# Patient Record
Sex: Male | Born: 1955 | Race: White | Hispanic: No | Marital: Married | Smoking: Former smoker
Health system: Southern US, Community
[De-identification: ages and names within clinical notes are randomized; demographics above are authoritative.]

## PROBLEM LIST (undated history)

## (undated) DIAGNOSIS — R0609 Other forms of dyspnea: Secondary | ICD-10-CM

## (undated) DIAGNOSIS — K08109 Complete loss of teeth, unspecified cause, unspecified class: Secondary | ICD-10-CM

## (undated) DIAGNOSIS — G459 Transient cerebral ischemic attack, unspecified: Secondary | ICD-10-CM

## (undated) DIAGNOSIS — R413 Other amnesia: Secondary | ICD-10-CM

## (undated) DIAGNOSIS — M1A9XX0 Chronic gout, unspecified, without tophus (tophi): Secondary | ICD-10-CM

## (undated) DIAGNOSIS — I10 Essential (primary) hypertension: Secondary | ICD-10-CM

## (undated) DIAGNOSIS — I6523 Occlusion and stenosis of bilateral carotid arteries: Secondary | ICD-10-CM

## (undated) DIAGNOSIS — I480 Paroxysmal atrial fibrillation: Secondary | ICD-10-CM

## (undated) DIAGNOSIS — F1991 Other psychoactive substance use, unspecified, in remission: Secondary | ICD-10-CM

## (undated) DIAGNOSIS — E119 Type 2 diabetes mellitus without complications: Secondary | ICD-10-CM

## (undated) DIAGNOSIS — F32A Depression, unspecified: Secondary | ICD-10-CM

## (undated) DIAGNOSIS — Z8249 Family history of ischemic heart disease and other diseases of the circulatory system: Secondary | ICD-10-CM

## (undated) DIAGNOSIS — E785 Hyperlipidemia, unspecified: Secondary | ICD-10-CM

## (undated) DIAGNOSIS — Z972 Presence of dental prosthetic device (complete) (partial): Secondary | ICD-10-CM

## (undated) DIAGNOSIS — M1A00X0 Idiopathic chronic gout, unspecified site, without tophus (tophi): Secondary | ICD-10-CM

## (undated) DIAGNOSIS — J439 Emphysema, unspecified: Secondary | ICD-10-CM

## (undated) DIAGNOSIS — Z794 Long term (current) use of insulin: Secondary | ICD-10-CM

## (undated) DIAGNOSIS — I739 Peripheral vascular disease, unspecified: Secondary | ICD-10-CM

## (undated) DIAGNOSIS — I6529 Occlusion and stenosis of unspecified carotid artery: Secondary | ICD-10-CM

## (undated) DIAGNOSIS — I2510 Atherosclerotic heart disease of native coronary artery without angina pectoris: Secondary | ICD-10-CM

## (undated) DIAGNOSIS — I499 Cardiac arrhythmia, unspecified: Secondary | ICD-10-CM

## (undated) DIAGNOSIS — F0390 Unspecified dementia without behavioral disturbance: Secondary | ICD-10-CM

## (undated) DIAGNOSIS — F419 Anxiety disorder, unspecified: Secondary | ICD-10-CM

## (undated) DIAGNOSIS — I639 Cerebral infarction, unspecified: Secondary | ICD-10-CM

## (undated) DIAGNOSIS — Z955 Presence of coronary angioplasty implant and graft: Secondary | ICD-10-CM

## (undated) DIAGNOSIS — F1910 Other psychoactive substance abuse, uncomplicated: Secondary | ICD-10-CM

## (undated) DIAGNOSIS — N189 Chronic kidney disease, unspecified: Secondary | ICD-10-CM

## (undated) DIAGNOSIS — Z8673 Personal history of transient ischemic attack (TIA), and cerebral infarction without residual deficits: Secondary | ICD-10-CM

## (undated) DIAGNOSIS — Z7901 Long term (current) use of anticoagulants: Secondary | ICD-10-CM

## (undated) DIAGNOSIS — F329 Major depressive disorder, single episode, unspecified: Secondary | ICD-10-CM

## (undated) DIAGNOSIS — M1990 Unspecified osteoarthritis, unspecified site: Secondary | ICD-10-CM

## (undated) DIAGNOSIS — I219 Acute myocardial infarction, unspecified: Secondary | ICD-10-CM

## (undated) HISTORY — PX: UMBILICAL HERNIA REPAIR: SHX196

## (undated) HISTORY — DX: Other psychoactive substance abuse, uncomplicated: F19.10

## (undated) HISTORY — DX: Peripheral vascular disease, unspecified: I73.9

## (undated) HISTORY — DX: Hyperlipidemia, unspecified: E78.5

## (undated) HISTORY — PX: HERNIA REPAIR: SHX51

## (undated) HISTORY — PX: PERIPHERAL VASCULAR INTERVENTION: CATH118257

## (undated) HISTORY — DX: Atherosclerotic heart disease of native coronary artery without angina pectoris: I25.10

---

## 1998-11-28 ENCOUNTER — Encounter: Admission: RE | Admit: 1998-11-28 | Discharge: 1998-11-28 | Payer: Self-pay | Admitting: Family Medicine

## 1999-03-08 ENCOUNTER — Emergency Department (HOSPITAL_COMMUNITY): Admission: EM | Admit: 1999-03-08 | Discharge: 1999-03-08 | Payer: Self-pay | Admitting: Emergency Medicine

## 2000-09-15 ENCOUNTER — Encounter: Payer: Self-pay | Admitting: Emergency Medicine

## 2000-09-15 ENCOUNTER — Emergency Department (HOSPITAL_COMMUNITY): Admission: EM | Admit: 2000-09-15 | Discharge: 2000-09-15 | Payer: Self-pay | Admitting: Emergency Medicine

## 2000-12-17 ENCOUNTER — Emergency Department (HOSPITAL_COMMUNITY): Admission: EM | Admit: 2000-12-17 | Discharge: 2000-12-17 | Payer: Self-pay | Admitting: Emergency Medicine

## 2000-12-17 ENCOUNTER — Encounter: Payer: Self-pay | Admitting: *Deleted

## 2003-02-04 ENCOUNTER — Emergency Department (HOSPITAL_COMMUNITY): Admission: EM | Admit: 2003-02-04 | Discharge: 2003-02-04 | Payer: Self-pay

## 2003-02-20 ENCOUNTER — Emergency Department (HOSPITAL_COMMUNITY): Admission: EM | Admit: 2003-02-20 | Discharge: 2003-02-20 | Payer: Self-pay | Admitting: Emergency Medicine

## 2003-10-12 ENCOUNTER — Emergency Department (HOSPITAL_COMMUNITY): Admission: EM | Admit: 2003-10-12 | Discharge: 2003-10-12 | Payer: Self-pay | Admitting: Emergency Medicine

## 2003-10-16 ENCOUNTER — Emergency Department (HOSPITAL_COMMUNITY): Admission: EM | Admit: 2003-10-16 | Discharge: 2003-10-16 | Payer: Self-pay | Admitting: Emergency Medicine

## 2004-04-24 ENCOUNTER — Emergency Department (HOSPITAL_COMMUNITY): Admission: EM | Admit: 2004-04-24 | Discharge: 2004-04-24 | Payer: Self-pay | Admitting: Emergency Medicine

## 2004-06-10 ENCOUNTER — Emergency Department (HOSPITAL_COMMUNITY): Admission: EM | Admit: 2004-06-10 | Discharge: 2004-06-10 | Payer: Self-pay | Admitting: Emergency Medicine

## 2004-08-03 ENCOUNTER — Emergency Department (HOSPITAL_COMMUNITY): Admission: EM | Admit: 2004-08-03 | Discharge: 2004-08-03 | Payer: Self-pay | Admitting: Family Medicine

## 2004-08-18 ENCOUNTER — Encounter: Admission: RE | Admit: 2004-08-18 | Discharge: 2004-08-18 | Payer: Self-pay | Admitting: Family Medicine

## 2004-12-18 ENCOUNTER — Emergency Department (HOSPITAL_COMMUNITY): Admission: EM | Admit: 2004-12-18 | Discharge: 2004-12-18 | Payer: Self-pay | Admitting: Family Medicine

## 2005-03-29 DIAGNOSIS — I2510 Atherosclerotic heart disease of native coronary artery without angina pectoris: Secondary | ICD-10-CM

## 2005-03-29 HISTORY — DX: Atherosclerotic heart disease of native coronary artery without angina pectoris: I25.10

## 2005-07-27 ENCOUNTER — Emergency Department (HOSPITAL_COMMUNITY): Admission: EM | Admit: 2005-07-27 | Discharge: 2005-07-27 | Payer: Self-pay | Admitting: Emergency Medicine

## 2005-09-21 ENCOUNTER — Ambulatory Visit: Payer: Self-pay | Admitting: *Deleted

## 2005-09-21 ENCOUNTER — Ambulatory Visit: Payer: Self-pay | Admitting: Family Medicine

## 2005-10-26 ENCOUNTER — Ambulatory Visit: Payer: Self-pay | Admitting: Family Medicine

## 2006-01-17 ENCOUNTER — Ambulatory Visit: Payer: Self-pay | Admitting: Internal Medicine

## 2006-01-17 ENCOUNTER — Inpatient Hospital Stay (HOSPITAL_COMMUNITY): Admission: EM | Admit: 2006-01-17 | Discharge: 2006-01-19 | Payer: Self-pay | Admitting: Emergency Medicine

## 2006-01-17 HISTORY — PX: CARDIAC CATHETERIZATION: SHX172

## 2006-01-18 ENCOUNTER — Encounter: Payer: Self-pay | Admitting: Cardiology

## 2006-01-20 ENCOUNTER — Ambulatory Visit: Payer: Self-pay | Admitting: Family Medicine

## 2006-05-27 ENCOUNTER — Ambulatory Visit: Payer: Self-pay | Admitting: Family Medicine

## 2006-09-01 ENCOUNTER — Ambulatory Visit: Payer: Self-pay | Admitting: Internal Medicine

## 2006-12-13 ENCOUNTER — Ambulatory Visit: Payer: Self-pay | Admitting: Internal Medicine

## 2006-12-14 ENCOUNTER — Encounter (INDEPENDENT_AMBULATORY_CARE_PROVIDER_SITE_OTHER): Payer: Self-pay | Admitting: *Deleted

## 2006-12-15 ENCOUNTER — Ambulatory Visit: Payer: Self-pay | Admitting: Internal Medicine

## 2006-12-17 ENCOUNTER — Emergency Department (HOSPITAL_COMMUNITY): Admission: EM | Admit: 2006-12-17 | Discharge: 2006-12-17 | Payer: Self-pay | Admitting: Emergency Medicine

## 2006-12-19 ENCOUNTER — Emergency Department (HOSPITAL_COMMUNITY): Admission: EM | Admit: 2006-12-19 | Discharge: 2006-12-19 | Payer: Self-pay | Admitting: Emergency Medicine

## 2006-12-19 ENCOUNTER — Ambulatory Visit: Payer: Self-pay | Admitting: Internal Medicine

## 2006-12-20 ENCOUNTER — Ambulatory Visit: Payer: Self-pay | Admitting: Internal Medicine

## 2006-12-23 ENCOUNTER — Ambulatory Visit: Payer: Self-pay | Admitting: Internal Medicine

## 2006-12-26 ENCOUNTER — Ambulatory Visit: Payer: Self-pay | Admitting: Internal Medicine

## 2007-01-12 ENCOUNTER — Ambulatory Visit: Payer: Self-pay | Admitting: Internal Medicine

## 2007-01-13 ENCOUNTER — Ambulatory Visit: Payer: Self-pay | Admitting: Internal Medicine

## 2007-01-19 ENCOUNTER — Ambulatory Visit: Payer: Self-pay | Admitting: Internal Medicine

## 2007-01-23 ENCOUNTER — Ambulatory Visit: Payer: Self-pay | Admitting: Internal Medicine

## 2007-02-02 ENCOUNTER — Ambulatory Visit: Payer: Self-pay | Admitting: Family Medicine

## 2007-02-14 ENCOUNTER — Ambulatory Visit: Payer: Self-pay | Admitting: Internal Medicine

## 2007-02-22 ENCOUNTER — Ambulatory Visit: Payer: Self-pay | Admitting: Internal Medicine

## 2007-03-08 ENCOUNTER — Ambulatory Visit: Payer: Self-pay | Admitting: Internal Medicine

## 2007-03-27 ENCOUNTER — Ambulatory Visit: Payer: Self-pay | Admitting: Family Medicine

## 2007-04-30 ENCOUNTER — Emergency Department (HOSPITAL_COMMUNITY): Admission: EM | Admit: 2007-04-30 | Discharge: 2007-04-30 | Payer: Self-pay | Admitting: Emergency Medicine

## 2007-05-03 ENCOUNTER — Emergency Department (HOSPITAL_COMMUNITY): Admission: EM | Admit: 2007-05-03 | Discharge: 2007-05-03 | Payer: Self-pay | Admitting: Emergency Medicine

## 2007-07-17 ENCOUNTER — Ambulatory Visit: Payer: Self-pay | Admitting: Internal Medicine

## 2007-09-21 ENCOUNTER — Ambulatory Visit: Payer: Self-pay | Admitting: Internal Medicine

## 2007-10-11 ENCOUNTER — Ambulatory Visit: Payer: Self-pay | Admitting: Internal Medicine

## 2007-10-13 ENCOUNTER — Ambulatory Visit (HOSPITAL_COMMUNITY): Admission: RE | Admit: 2007-10-13 | Discharge: 2007-10-13 | Payer: Self-pay | Admitting: Family Medicine

## 2007-10-30 ENCOUNTER — Ambulatory Visit: Payer: Self-pay | Admitting: Internal Medicine

## 2008-01-25 ENCOUNTER — Ambulatory Visit: Payer: Self-pay | Admitting: Internal Medicine

## 2008-02-20 ENCOUNTER — Ambulatory Visit: Payer: Self-pay | Admitting: Family Medicine

## 2008-02-20 ENCOUNTER — Encounter (INDEPENDENT_AMBULATORY_CARE_PROVIDER_SITE_OTHER): Payer: Self-pay | Admitting: Adult Health

## 2008-02-20 LAB — CONVERTED CEMR LAB
ALT: 29 units/L (ref 0–53)
AST: 21 units/L (ref 0–37)
Albumin: 4.4 g/dL (ref 3.5–5.2)
Alkaline Phosphatase: 118 units/L — ABNORMAL HIGH (ref 39–117)
BUN: 13 mg/dL (ref 6–23)
Basophils Absolute: 0 10*3/uL (ref 0.0–0.1)
Basophils Relative: 0 % (ref 0–1)
CO2: 25 meq/L (ref 19–32)
Calcium: 8.9 mg/dL (ref 8.4–10.5)
Chloride: 105 meq/L (ref 96–112)
Creatinine, Ser: 1.23 mg/dL (ref 0.40–1.50)
Eosinophils Absolute: 0.1 10*3/uL (ref 0.0–0.7)
Eosinophils Relative: 2 % (ref 0–5)
Glucose, Bld: 106 mg/dL — ABNORMAL HIGH (ref 70–99)
HCT: 46.2 % (ref 39.0–52.0)
Hemoglobin: 15.5 g/dL (ref 13.0–17.0)
Lymphocytes Relative: 27 % (ref 12–46)
Lymphs Abs: 2.2 10*3/uL (ref 0.7–4.0)
MCHC: 33.5 g/dL (ref 30.0–36.0)
MCV: 96.7 fL (ref 78.0–100.0)
Monocytes Absolute: 0.8 10*3/uL (ref 0.1–1.0)
Monocytes Relative: 10 % (ref 3–12)
Neutro Abs: 4.9 10*3/uL (ref 1.7–7.7)
Neutrophils Relative %: 61 % (ref 43–77)
PSA: 1.04 ng/mL (ref 0.10–4.00)
Platelets: 202 10*3/uL (ref 150–400)
Potassium: 4 meq/L (ref 3.5–5.3)
RBC: 4.78 M/uL (ref 4.22–5.81)
RDW: 14.3 % (ref 11.5–15.5)
Sodium: 141 meq/L (ref 135–145)
Total Bilirubin: 0.4 mg/dL (ref 0.3–1.2)
Total Protein: 7 g/dL (ref 6.0–8.3)
Uric Acid, Serum: 7.2 mg/dL (ref 4.0–7.8)
WBC: 8 10*3/uL (ref 4.0–10.5)

## 2008-03-06 ENCOUNTER — Ambulatory Visit: Payer: Self-pay | Admitting: Family Medicine

## 2008-03-08 ENCOUNTER — Encounter (INDEPENDENT_AMBULATORY_CARE_PROVIDER_SITE_OTHER): Payer: Self-pay | Admitting: Adult Health

## 2008-03-08 ENCOUNTER — Ambulatory Visit: Payer: Self-pay | Admitting: Family Medicine

## 2008-03-08 LAB — CONVERTED CEMR LAB
Cholesterol: 89 mg/dL (ref 0–200)
HDL: 34 mg/dL — ABNORMAL LOW (ref >39)
LDL Cholesterol: 42 mg/dL (ref 0–99)
Total CHOL/HDL Ratio: 2.6
Triglycerides: 63 mg/dL (ref <150)
VLDL: 13 mg/dL (ref 0–40)

## 2008-05-01 ENCOUNTER — Inpatient Hospital Stay (HOSPITAL_COMMUNITY): Admission: EM | Admit: 2008-05-01 | Discharge: 2008-05-04 | Payer: Self-pay | Admitting: Emergency Medicine

## 2008-05-01 ENCOUNTER — Ambulatory Visit: Payer: Self-pay | Admitting: Cardiovascular Disease

## 2008-05-02 ENCOUNTER — Encounter (INDEPENDENT_AMBULATORY_CARE_PROVIDER_SITE_OTHER): Payer: Self-pay | Admitting: Family Medicine

## 2008-05-03 HISTORY — PX: CARDIAC CATHETERIZATION: SHX172

## 2008-05-28 ENCOUNTER — Ambulatory Visit: Payer: Self-pay | Admitting: Internal Medicine

## 2008-05-28 ENCOUNTER — Encounter (INDEPENDENT_AMBULATORY_CARE_PROVIDER_SITE_OTHER): Payer: Self-pay | Admitting: Adult Health

## 2008-05-28 LAB — CONVERTED CEMR LAB
ALT: 21 units/L (ref 0–53)
AST: 18 units/L (ref 0–37)
Albumin: 4 g/dL (ref 3.5–5.2)
Alkaline Phosphatase: 93 units/L (ref 39–117)
BUN: 15 mg/dL (ref 6–23)
Band Neutrophils: 0 % (ref 0–10)
Basophils Absolute: 0 10*3/uL (ref 0.0–0.1)
Basophils Relative: 1 % (ref 0–1)
CO2: 24 meq/L (ref 19–32)
Calcium: 9.1 mg/dL (ref 8.4–10.5)
Chloride: 108 meq/L (ref 96–112)
Creatinine, Ser: 1.11 mg/dL (ref 0.40–1.50)
Eosinophils Absolute: 0.2 10*3/uL (ref 0.0–0.7)
Eosinophils Relative: 2 % (ref 0–5)
Glucose, Bld: 85 mg/dL (ref 70–99)
HCT: 42.2 % (ref 39.0–52.0)
Hemoglobin: 14.5 g/dL (ref 13.0–17.0)
Lymphocytes Relative: 36 % (ref 12–46)
Lymphs Abs: 2.8 10*3/uL (ref 0.7–4.0)
MCHC: 34.4 g/dL (ref 30.0–36.0)
MCV: 97.2 fL (ref 78.0–100.0)
Monocytes Absolute: 1.1 10*3/uL — ABNORMAL HIGH (ref 0.1–1.0)
Monocytes Relative: 14 % — ABNORMAL HIGH (ref 3–12)
Neutro Abs: 3.6 10*3/uL (ref 1.7–7.7)
Neutrophils Relative %: 48 % (ref 43–77)
Platelets: 184 10*3/uL (ref 150–400)
Potassium: 4.7 meq/L (ref 3.5–5.3)
RBC: 4.34 M/uL (ref 4.22–5.81)
RDW: 14.5 % (ref 11.5–15.5)
Sodium: 142 meq/L (ref 135–145)
Total Bilirubin: 0.4 mg/dL (ref 0.3–1.2)
Total Protein: 6.9 g/dL (ref 6.0–8.3)
WBC: 7.6 10*3/uL (ref 4.0–10.5)

## 2008-10-21 ENCOUNTER — Emergency Department (HOSPITAL_COMMUNITY): Admission: EM | Admit: 2008-10-21 | Discharge: 2008-10-21 | Payer: Self-pay | Admitting: Emergency Medicine

## 2008-12-13 ENCOUNTER — Emergency Department (HOSPITAL_COMMUNITY): Admission: EM | Admit: 2008-12-13 | Discharge: 2008-12-13 | Payer: Self-pay | Admitting: Emergency Medicine

## 2009-02-26 ENCOUNTER — Emergency Department (HOSPITAL_COMMUNITY): Admission: EM | Admit: 2009-02-26 | Discharge: 2009-02-26 | Payer: Self-pay | Admitting: Emergency Medicine

## 2009-03-25 ENCOUNTER — Ambulatory Visit: Payer: Self-pay | Admitting: Cardiovascular Disease

## 2009-03-25 ENCOUNTER — Inpatient Hospital Stay (HOSPITAL_COMMUNITY): Admission: EM | Admit: 2009-03-25 | Discharge: 2009-03-26 | Payer: Self-pay | Admitting: Emergency Medicine

## 2009-03-26 ENCOUNTER — Encounter (INDEPENDENT_AMBULATORY_CARE_PROVIDER_SITE_OTHER): Payer: Self-pay | Admitting: Internal Medicine

## 2010-04-28 NOTE — Miscellaneous (Signed)
Summary: VIP  Patient: [name] Note: All result statuses are Final unless otherwise noted.  Tests: (1) VIP (Medications)   LLIMPORTMEDS              "Result Below..."       RESULT: ZOLOFT TABS 100 MG*TAKE ONE (1) TABLET EACH DAY   GENE HAS ICP ZOLOFT 100MG  0608*11/04/2006*Last Refill: 12/15/2006*[id number]*******   LLIMPORTMEDS              "Result Below..."       RESULT: XYZAL TABS 5 MG*TAKE 1 TABLET BY MOUTH EVERY DAY AS NEEDED FOR CONGESTION*05/27/2006*Last Refill: Ava.New*******   LLIMPORTMEDS              "Result Below..."       RESULT: VISTARIL CAPS 25 MG*TAKE 1 CAPSULE EVERY 6 HOURS AS NEEDED FOR BREAKTHROUGH ANXIETY*11/04/2006*Last Refill: Tara.Kingdom*******   LLIMPORTMEDS              "Result Below..."       RESULT: TRAZODONE HCL TABS 50 MG*TAKE ONE (1) TABLET AT BEDTIME*12/15/2006*Last Refill: Amey.Fanny*******   LLIMPORTMEDS              "Result Below..."       RESULT: TRAMADOL HCL TABS 50 MG*TAKE ONE TABLET EVERY 6 HOURS AS NEEDED FOR  PAIN*05/27/2006*Last Refill: 09/01/2006*[id number]*******   LLIMPORTMEDS              "Result Below..."       RESULT: TOPROL XL TB24 100 MG*TAKE ONE (1) TABLET EACH DAY*12/15/2006*Last Refill: Hart.Albright*******   LLIMPORTMEDS              "Result Below..."       RESULT: TIAZAC CP24 240 MG*TAKE ONE (1) CAPSULE EACH DAY*11/04/2006*Last Refill: Elis.Russel*******   LLIMPORTMEDS              "Result Below..."       RESULT: THIAMINE HCL TABS 100 MG*TAKE ONE (1) TABLET EACH DAY*11/04/2006*Last Refill: Unknown*[id number]*******   LLIMPORTMEDS              "Result Below..."       RESULT: COLCHICINE TABS 0.6 MG*TAKE ONE (1) TABLET BY MOUTH TWO (2) TIMES DAILY FOR GOUT*04/18/2006*Last Refill: [email or web address]*[id number]*******   LLIMPORTMEDS              "Result Below..."       RESULT: COLCHICINE TABS 0.6 MG*TAKE ONE (1) TABLET BY MOUTH TWO (2) TIMES DAILY*11/04/2006*Last Refill: [email or web address]*[id number]*******   LLIMPORTMEDS              "Result Below..."       RESULT:  CLONIDINE HCL TABS 0.2 MG*TAKE ONE (1) TABLET BY MOUTH 3 TIMES DAILY*12/23/2006*Last Refill: [email or web address]*[id number]*******   LLIMPORTMEDS              "Result Below..."       RESULT: BUSPIRONE HCL TABS 15 MG*TAKE 2/3 (TWO-THIRDS) TABLET BY MOUTH TWICE DAILY FOR ANXIETY*11/04/2006*Last Refill: [email or web address]*[id number]*******   LLIMPORTMEDS              "Result Below..."       RESULT: ASPIRIN EC TBEC 325 MG*TAKE ONE (1) TABLET EACH DAY*11/04/2006*Last Refill: Skyelar.Clunes*******   LLIMPORTMEDS              "Result Below..."       RESULT: AMITRIPTYLINE HCL TABS 25 MG*TAKE ONE TABLET BY MOUTH EVERY NIGHT AT BEDTIME*11/04/2006*Last Refill: Unknown*1130*******   LLIMPORTMEDS              "Result Below..."  RESULT: ALLOPURINOL TABS 100 MG*TAKE ONE (1) TABLET BY MOUTH EVERY DAY*11/04/2006*Last Refill: 12/09/2006*864*******   LLIMPORTALLS              NKDA***  Note: An exclamation mark (!) indicates a result that was not dispersed into the flowsheet. Document Creation Date: 01/26/2007 2:57 PM _______________________________________________________________________  (1) Order result status: Final Collection or observation date-time: 12/14/2006 Requested date-time: 12/14/2006 Receipt date-time:  Reported date-time: 12/14/2006 Referring Physician:   Ordering Physician:   Specimen Source:  Source: [place] Order Number:  Lab site:

## 2010-06-29 LAB — DIFFERENTIAL
Basophils Absolute: 0 10*3/uL (ref 0.0–0.1)
Basophils Relative: 1 % (ref 0–1)
Eosinophils Absolute: 0.1 10*3/uL (ref 0.0–0.7)
Eosinophils Relative: 1 % (ref 0–5)
Lymphocytes Relative: 27 % (ref 12–46)
Lymphs Abs: 2 10*3/uL (ref 0.7–4.0)
Monocytes Absolute: 0.8 10*3/uL (ref 0.1–1.0)
Monocytes Relative: 11 % (ref 3–12)
Neutro Abs: 4.6 10*3/uL (ref 1.7–7.7)
Neutrophils Relative %: 61 % (ref 43–77)

## 2010-06-29 LAB — CARDIAC PANEL(CRET KIN+CKTOT+MB+TROPI)
CK, MB: 1.1 ng/mL (ref 0.3–4.0)
Relative Index: INVALID (ref 0.0–2.5)
Total CK: 75 U/L (ref 7–232)
Troponin I: 0.03 ng/mL (ref 0.00–0.06)

## 2010-06-29 LAB — POCT CARDIAC MARKERS
CKMB, poc: <1 ng/mL — ABNORMAL LOW (ref 1.0–8.0)
Myoglobin, poc: 73.6 ng/mL (ref 12–200)
Troponin i, poc: <0.05 ng/mL (ref 0.00–0.09)

## 2010-06-29 LAB — RAPID URINE DRUG SCREEN, HOSP PERFORMED
Amphetamines: NOT DETECTED
Barbiturates: NOT DETECTED
Benzodiazepines: NOT DETECTED
Cocaine: NOT DETECTED
Opiates: NOT DETECTED
Tetrahydrocannabinol: POSITIVE — AB

## 2010-06-29 LAB — CBC
HCT: 43 % (ref 39.0–52.0)
Hemoglobin: 15.2 g/dL (ref 13.0–17.0)
MCHC: 35.4 g/dL (ref 30.0–36.0)
MCV: 97 fL (ref 78.0–100.0)
Platelets: 172 10*3/uL (ref 150–400)
RBC: 4.43 MIL/uL (ref 4.22–5.81)
RDW: 13.1 % (ref 11.5–15.5)
WBC: 7.6 10*3/uL (ref 4.0–10.5)

## 2010-06-29 LAB — POCT I-STAT, CHEM 8
BUN: 16 mg/dL (ref 6–23)
Calcium, Ion: 1.1 mmol/L — ABNORMAL LOW (ref 1.12–1.32)
Chloride: 106 mEq/L (ref 96–112)
Creatinine, Ser: 1.1 mg/dL (ref 0.4–1.5)
Glucose, Bld: 90 mg/dL (ref 70–99)
HCT: 45 % (ref 39.0–52.0)
Hemoglobin: 15.3 g/dL (ref 13.0–17.0)
Potassium: 3.5 mEq/L (ref 3.5–5.1)
Sodium: 141 mEq/L (ref 135–145)
TCO2: 30 mmol/L (ref 0–100)

## 2010-06-29 LAB — TSH: TSH: 1.658 u[IU]/mL (ref 0.350–4.500)

## 2010-06-30 LAB — CBC
HCT: 44.3 % (ref 39.0–52.0)
Hemoglobin: 15.2 g/dL (ref 13.0–17.0)
MCHC: 34.3 g/dL (ref 30.0–36.0)
MCV: 97.9 fL (ref 78.0–100.0)
Platelets: 175 10*3/uL (ref 150–400)
RBC: 4.52 MIL/uL (ref 4.22–5.81)
RDW: 13.6 % (ref 11.5–15.5)
WBC: 10.1 10*3/uL (ref 4.0–10.5)

## 2010-06-30 LAB — BASIC METABOLIC PANEL
BUN: 12 mg/dL (ref 6–23)
CO2: 26 mEq/L (ref 19–32)
Calcium: 8.8 mg/dL (ref 8.4–10.5)
Chloride: 105 mEq/L (ref 96–112)
Creatinine, Ser: 1.09 mg/dL (ref 0.4–1.5)
GFR calc Af Amer: >60 mL/min (ref >60)
GFR calc non Af Amer: >60 mL/min (ref >60)
Glucose, Bld: 96 mg/dL (ref 70–99)
Potassium: 3.8 mEq/L (ref 3.5–5.1)
Sodium: 139 mEq/L (ref 135–145)

## 2010-06-30 LAB — DIFFERENTIAL
Basophils Absolute: 0 10*3/uL (ref 0.0–0.1)
Basophils Relative: 0 % (ref 0–1)
Eosinophils Absolute: 0.1 10*3/uL (ref 0.0–0.7)
Eosinophils Relative: 1 % (ref 0–5)
Lymphocytes Relative: 21 % (ref 12–46)
Lymphs Abs: 2.1 10*3/uL (ref 0.7–4.0)
Monocytes Absolute: 1.2 10*3/uL — ABNORMAL HIGH (ref 0.1–1.0)
Monocytes Relative: 11 % (ref 3–12)
Neutro Abs: 6.7 10*3/uL (ref 1.7–7.7)
Neutrophils Relative %: 66 % (ref 43–77)

## 2010-07-03 LAB — POCT I-STAT, CHEM 8
BUN: 20 mg/dL (ref 6–23)
Calcium, Ion: 1.14 mmol/L (ref 1.12–1.32)
Chloride: 107 mEq/L (ref 96–112)
Creatinine, Ser: 1.2 mg/dL (ref 0.4–1.5)
Glucose, Bld: 92 mg/dL (ref 70–99)
HCT: 49 % (ref 39.0–52.0)
Hemoglobin: 16.7 g/dL (ref 13.0–17.0)
Potassium: 4.3 mEq/L (ref 3.5–5.1)
Sodium: 141 mEq/L (ref 135–145)
TCO2: 26 mmol/L (ref 0–100)

## 2010-07-03 LAB — POCT CARDIAC MARKERS
CKMB, poc: <1 ng/mL — ABNORMAL LOW (ref 1.0–8.0)
Myoglobin, poc: 87.9 ng/mL (ref 12–200)
Troponin i, poc: <0.05 ng/mL (ref 0.00–0.09)

## 2010-07-14 LAB — COMPREHENSIVE METABOLIC PANEL
ALT: 18 U/L (ref 0–53)
AST: 20 U/L (ref 0–37)
Albumin: 3.6 g/dL (ref 3.5–5.2)
Alkaline Phosphatase: 82 U/L (ref 39–117)
BUN: 8 mg/dL (ref 6–23)
CO2: 29 mEq/L (ref 19–32)
Calcium: 8.7 mg/dL (ref 8.4–10.5)
Chloride: 102 mEq/L (ref 96–112)
Creatinine, Ser: 0.99 mg/dL (ref 0.4–1.5)
GFR calc Af Amer: >60 mL/min (ref >60)
GFR calc non Af Amer: >60 mL/min (ref >60)
Glucose, Bld: 103 mg/dL — ABNORMAL HIGH (ref 70–99)
Potassium: 4.2 mEq/L (ref 3.5–5.1)
Sodium: 139 mEq/L (ref 135–145)
Total Bilirubin: 0.7 mg/dL (ref 0.3–1.2)
Total Protein: 6.6 g/dL (ref 6.0–8.3)

## 2010-07-14 LAB — BASIC METABOLIC PANEL
BUN: 10 mg/dL (ref 6–23)
BUN: 9 mg/dL (ref 6–23)
CO2: 27 mEq/L (ref 19–32)
CO2: 28 mEq/L (ref 19–32)
Calcium: 8.5 mg/dL (ref 8.4–10.5)
Calcium: 8.6 mg/dL (ref 8.4–10.5)
Chloride: 100 mEq/L (ref 96–112)
Chloride: 106 mEq/L (ref 96–112)
Creatinine, Ser: 1.13 mg/dL (ref 0.4–1.5)
Creatinine, Ser: 1.13 mg/dL (ref 0.4–1.5)
GFR calc Af Amer: >60 mL/min (ref >60)
GFR calc Af Amer: >60 mL/min (ref >60)
GFR calc non Af Amer: >60 mL/min (ref >60)
GFR calc non Af Amer: >60 mL/min (ref >60)
Glucose, Bld: 88 mg/dL (ref 70–99)
Glucose, Bld: 91 mg/dL (ref 70–99)
Potassium: 3.5 mEq/L (ref 3.5–5.1)
Potassium: 3.8 mEq/L (ref 3.5–5.1)
Sodium: 137 mEq/L (ref 135–145)
Sodium: 140 mEq/L (ref 135–145)

## 2010-07-14 LAB — CARDIAC PANEL(CRET KIN+CKTOT+MB+TROPI)
CK, MB: 1.3 ng/mL (ref 0.3–4.0)
CK, MB: 1.4 ng/mL (ref 0.3–4.0)
Relative Index: 1.1 (ref 0.0–2.5)
Relative Index: INVALID (ref 0.0–2.5)
Total CK: 116 U/L (ref 7–232)
Total CK: 82 U/L (ref 7–232)
Troponin I: 0.01 ng/mL (ref 0.00–0.06)
Troponin I: <0.01 ng/mL (ref 0.00–0.06)

## 2010-07-14 LAB — CBC
HCT: 38.7 % — ABNORMAL LOW (ref 39.0–52.0)
HCT: 40.6 % (ref 39.0–52.0)
HCT: 42.1 % (ref 39.0–52.0)
HCT: 43.1 % (ref 39.0–52.0)
HCT: 43.6 % (ref 39.0–52.0)
Hemoglobin: 13.5 g/dL (ref 13.0–17.0)
Hemoglobin: 14.3 g/dL (ref 13.0–17.0)
Hemoglobin: 14.4 g/dL (ref 13.0–17.0)
Hemoglobin: 14.7 g/dL (ref 13.0–17.0)
Hemoglobin: 15.3 g/dL (ref 13.0–17.0)
MCHC: 33.2 g/dL (ref 30.0–36.0)
MCHC: 34.8 g/dL (ref 30.0–36.0)
MCHC: 34.9 g/dL (ref 30.0–36.0)
MCHC: 35 g/dL (ref 30.0–36.0)
MCHC: 35.4 g/dL (ref 30.0–36.0)
MCV: 94.9 fL (ref 78.0–100.0)
MCV: 95.4 fL (ref 78.0–100.0)
MCV: 95.9 fL (ref 78.0–100.0)
MCV: 96 fL (ref 78.0–100.0)
MCV: 96.4 fL (ref 78.0–100.0)
Platelets: 147 10*3/uL — ABNORMAL LOW (ref 150–400)
Platelets: 155 10*3/uL (ref 150–400)
Platelets: 158 10*3/uL (ref 150–400)
Platelets: 167 10*3/uL (ref 150–400)
Platelets: 175 10*3/uL (ref 150–400)
RBC: 4.03 MIL/uL — ABNORMAL LOW (ref 4.22–5.81)
RBC: 4.28 MIL/uL (ref 4.22–5.81)
RBC: 4.38 MIL/uL (ref 4.22–5.81)
RBC: 4.47 MIL/uL (ref 4.22–5.81)
RBC: 4.57 MIL/uL (ref 4.22–5.81)
RDW: 14.1 % (ref 11.5–15.5)
RDW: 14.3 % (ref 11.5–15.5)
RDW: 14.3 % (ref 11.5–15.5)
RDW: 14.4 % (ref 11.5–15.5)
RDW: 14.5 % (ref 11.5–15.5)
WBC: 10.9 10*3/uL — ABNORMAL HIGH (ref 4.0–10.5)
WBC: 16.5 10*3/uL — ABNORMAL HIGH (ref 4.0–10.5)
WBC: 7.7 10*3/uL (ref 4.0–10.5)
WBC: 9.1 10*3/uL (ref 4.0–10.5)
WBC: 9.8 10*3/uL (ref 4.0–10.5)

## 2010-07-14 LAB — LIPID PANEL
Cholesterol: 90 mg/dL (ref 0–200)
HDL: 25 mg/dL — ABNORMAL LOW (ref >39)
LDL Cholesterol: 59 mg/dL (ref 0–99)
Total CHOL/HDL Ratio: 3.6 RATIO
Triglycerides: 30 mg/dL (ref <150)
VLDL: 6 mg/dL (ref 0–40)

## 2010-07-14 LAB — URINE MICROSCOPIC-ADD ON

## 2010-07-14 LAB — POCT CARDIAC MARKERS
CKMB, poc: <1 ng/mL — ABNORMAL LOW (ref 1.0–8.0)
CKMB, poc: <1 ng/mL — ABNORMAL LOW (ref 1.0–8.0)
Myoglobin, poc: 51.6 ng/mL (ref 12–200)
Myoglobin, poc: 84.6 ng/mL (ref 12–200)
Troponin i, poc: <0.05 ng/mL (ref 0.00–0.09)
Troponin i, poc: <0.05 ng/mL (ref 0.00–0.09)

## 2010-07-14 LAB — LIPASE, BLOOD: Lipase: 27 U/L (ref 11–59)

## 2010-07-14 LAB — HEMOGLOBIN A1C
Hgb A1c MFr Bld: 5.8 % (ref 4.6–6.1)
Mean Plasma Glucose: 120 mg/dL

## 2010-07-14 LAB — URINALYSIS, ROUTINE W REFLEX MICROSCOPIC
Bilirubin Urine: NEGATIVE
Glucose, UA: NEGATIVE mg/dL
Ketones, ur: NEGATIVE mg/dL
Leukocytes, UA: NEGATIVE
Nitrite: NEGATIVE
Protein, ur: NEGATIVE mg/dL
Specific Gravity, Urine: 1.012 (ref 1.005–1.030)
Urobilinogen, UA: 0.2 mg/dL (ref 0.0–1.0)
pH: 6 (ref 5.0–8.0)

## 2010-07-14 LAB — ETHANOL: Alcohol, Ethyl (B): 9 mg/dL (ref 0–10)

## 2010-07-14 LAB — GLUCOSE, CAPILLARY: Glucose-Capillary: 93 mg/dL (ref 70–99)

## 2010-07-14 LAB — D-DIMER, QUANTITATIVE: D-Dimer, Quant: 0.33 ug/mL-FEU (ref 0.00–0.48)

## 2010-07-14 LAB — RAPID URINE DRUG SCREEN, HOSP PERFORMED
Amphetamines: NOT DETECTED
Barbiturates: NOT DETECTED
Benzodiazepines: NOT DETECTED
Cocaine: NOT DETECTED
Opiates: POSITIVE — AB
Tetrahydrocannabinol: POSITIVE — AB

## 2010-07-14 LAB — TSH: TSH: 3.995 u[IU]/mL (ref 0.350–4.500)

## 2010-08-11 NOTE — H&P (Signed)
[name], [name]                 ACCOUNT NO.:  [id number]   MEDICAL RECORD NO.:  [id number]          PATIENT TYPE:  INP   LOCATION:  2917                         FACILITY:  [facility]   PHYSICIAN:  Wayne A. Sheffield Slider, M.D.    DATE OF BIRTH:  December 20, 1955   DATE OF ADMISSION:  05/01/2008  DATE OF DISCHARGE:                              HISTORY & PHYSICAL   REASON FOR ADMISSION:  Chest pain.   HISTORY OF PRESENT ILLNESS:  This is a 55 year old gentleman who comes  in complaining of heavy tight chest pain substernally with radiation to  the neck and jaw and left arm since approximately 8:30 a.m. on the day  of admission.  Nothing has been done that provokes the pain and the pain  does have some associated nausea.  He denies any shortness of breath.  He does complain of occasional diaphoresis with this pain.  Nothing does  seem to make it worse that he knows of, but he has mostly done nothing  since it started.  Nothing including aspirin, nitroglycerin x2, or rest  has relieved his pain.  Morphine and the third nitroglycerin, however,  did help the pain slightly in the emergency room.  Again, the pain is  essentially constant.  The patient's only other complaint at this time  is some polyuria for several weeks.   PRIMARY CARE PHYSICIAN:  [facility].   PREVIOUS CARDIOLOGIST:  [facility].   PAST MEDICAL HISTORY:  1. Hypertension.  2. History of AFib.  3. History of nonobstructive coronary artery disease.  4. History of cocaine abuse, reportedly claims since 2007.  5. Depression and anxiety.  6. Tobacco abuse with quit date set of January 2010.  7. Gout.  8. History of acute renal insufficiency.  9. Personal history of mini strokes.   PAST SURGICAL HISTORY:  Umbilical hernia repair.   FAMILY HISTORY:  Mother died from an MI in her 4s, she was also  complicated with hepatitis.  Father died of an MI in his 2s and other  heart problems that the patient is not aware of.  The patient  has 3  brothers, all are deceased from accident.  The patient has two sons, one  of which has Buerger disease and is in now remission and one of which is  healthy.  He also has one daughter which is also healthy.   SOCIAL HISTORY:  The patient is currently filing for disability.  He has  a seventh grade education.  He currently lives with son and daughter-in-  Social worker and grandchild and his home situation is quite stressful.  He is  separated from his wife for approximately 3 years to whom he has been  married for greater than 30 years.  She is also a cocaine abuser.  The  patient has approximately 10 years' smoking history where he smoked  about a pack a day and reportedly he quit on March 29, 2008.  He has  also a history of alcohol and cocaine abuse, but reportedly has been  clean from these substances for 2 years.  He does report  occasional  marijuana use at this time.   CURRENT MEDICATIONS:  Although the patient has been out of these for  approximately 3 weeks include:  1. Allopurinol 100 mg p.o. daily.  2. Tiazac 360 mg p.o. daily  3. Thiamine 100 mg p.o. daily.  4. Zoloft 100 mg p.o. daily.  5. Toprol-XL 25 mg p.o. daily.  6. Trazodone 50 mg p.o. nightly.  7. BuSpar 2 mg b.i.d.   ALLERGIES:  No known drug allergies.   REVIEW OF SYMPTOMS:  Positive for polyuria and chest pain as noted in  the HPI, but otherwise is negative for edema and dysuria, abdominal  pain, bowel symptoms and negative on greater than 10 other systems.   PHYSICAL EXAMINATION:  VITAL SIGNS:  Temperature 97.9, respiratory rate  14, pulse 68, blood pressure 147-177/72-97.  He is 98% on 2 liters nasal  cannula.  GENERAL:  This is a pleasant overweight white male in no apparent  distress, resting comfortably.  HEENT:  Normocephalic and atraumatic with pupils equal, round, and  reactive to light.  Extraocular muscles are intact.  Nares are clear  without discharge.  Oropharynx is pink and moist.  There is  poor  dentition.  NECK:  Supple.  CARDIOVASCULAR:  Regular rate and rhythm.  No murmurs, rubs, or gallops.  There are 2+ pulses in all extremities.  No JVD is appreciated.  PULMONARY:  Crackles at the bases bilaterally, but normal work of  breathing.  ABDOMEN:  Soft, nontender, and nondistended with positive bowel sounds.  No hepatosplenomegaly appreciated.  EXTREMITIES:  No edema and 2+ distal pulses.  SKIN:  No rashes.  NEURO:  The patient is alert and oriented x3 in no apparent deficits  grossly.   LABORATORY DATA:  White blood cell count 7.7, hemoglobin 14.3,  hematocrit 43.1, and platelet count 175 with point of care negative x2  sets.  EKG, I am unable to appreciate P-wave and there is some J-point  elevation in V2 and V3, but this is unchanged from previous EKG.  Chest  x-ray reveals no acute disease.   ASSESSMENT:  This is a 55 year old gentleman with chest pain, history of  nonobstructive coronary artery disease, and history of hypertension, now  with hypertensive urgency.   PLAN:  1. Chest pain.  Given a personal past medical history and strong      family history, the patient desires a full chest pain rule out.  We      will check cardiac enzymes x2 more sets and repeat an EKG in the      a.m.  The patient may need a stress test versus a cath and we will      touch base with [facility].  We will start nitroglycerin      drip for pain control and also check his blood pressure down and      morphine for pain.  We will admit him to Step-Down Unit secondary      to his need for the nitro drip.  We will risk stratify this patient      with a fasting lipid panel and A1c.  We will also rule out other      causes by checking a urine drug screen and we will add Protonix.      We will start aspirin 325 mg p.o. daily in addition to these      things.  2. Hypertension.  We will treat with a nitroglycerin drip.  We will  restart his home diltiazem for now and monitor  his heart rate      closely.  The patient has a questionable history of atrial      fibrillation, thus we will need to watch telemetry and EKG closely.      Cardiology will be following, which will be greatly appreciated.      We will hold his Toprol secondary to his low heart rate currently.  3. Anxiety and depression.  We will continue the patient on Zoloft,      BuSpar, and trazodone.  4. Gout.  We will continue the patient's allopurinol.  5. History of substance abuse.  We expected to be marijuana positive      per the patient's report, but negative otherwise and we will follow      this closely.  We will hold beta-blocker for now given his history.  6. History of tobacco abuse.  The patient does not require nicotine      patch at this time per his request.  7. Fluids, electrolytes, nutrition and gastrointestinal.  We will      check electrolytes.  In the meantime, we will start him on a low-      sodium, heart-healthy diet.  We will start Protonix in case this is      gastrointestinal-related chest pain.  8. Prophylaxis.  We will start the patient on subcu heparin.  9. Disposition is pending a complete blood and cardiac workup.      Ancil Boozer, MD  Electronically Signed      Arnette Norris. Sheffield Slider, M.D.  Electronically Signed    SA/MEDQ  D:  05/03/2008  T:  05/04/2008  Job:  [id number]

## 2010-08-11 NOTE — Consult Note (Signed)
[name], [name]                 ACCOUNT NO.:  [id number]   MEDICAL RECORD NO.:  [id number]          PATIENT TYPE:  INP   LOCATION:  2920                         FACILITY:  [facility]   PHYSICIAN:  Verne Carrow, MDDATE OF BIRTH:  07-Feb-1956   DATE OF CONSULTATION:  05/01/2008  DATE OF DISCHARGE:                                 CONSULTATION   REASON FOR CONSULTATION:  Chest pain.   HISTORY OF PRESENT ILLNESS:  This is a 55 year old with past medical  history significant for hypertension, coronary artery disease, status  post cath in 2007 with MI, nonobstructive coronary artery disease,  history of polysubstance abuse, last use of cocaine 2 years ago per the  patient.  He also had an ejection fraction of 65% by 2D echo in 2007,  who presented to the emergency department complaining of chest pain that  has started the morning of admission around 8 a.m.  He described pain as  pressure like, 8/10 in intensity, constant, radiating to the left  shoulder and arm.  No alleviating or exacerbating factors.  Chest pain  is not pleuritic in nature.  He denies any association with food or  exercise.  He also denies any chest pain on exertion.  He relate dyspnea  on exertion for the last 2 years has been stable.  The patient relate  that his chest pain is associated with diaphoresis.  He also relate that  he has not taken his medication for the last month due to economic  reasons.  The patient developed in the emergency department 2 episodes  of vomiting while we were interviewing him.  Yellow in consistency.  No  bright blood.   ROS: Negative all ohers system except as per HPI.   ALLERGIES:  No known drug allergies.   PAST MEDICAL HISTORY:  1. Hypertension.  2. Coronary artery disease.  3. Gout.  4. Anxiety.  5. Depression.  6. History of alcohol abuse.  7. History of AFib.   PAST SURGICAL HISTORY:  Umbilical hernia repair.   SOCIAL HISTORY:  He is a smoker, he relate that he quit  last month.  Alcohol, he quit 2 years ago.  No prior history of DT.  Recreational  drugs, last use of cocaine was 2 years ago; three days ago, marijuana.   FAMILY HISTORY:  Mother died at 5.  She had history of hepatitis and  she had history of 4 bypass.  Father died at 6 year old of an MI.   HOME MEDICATIONS:  1. Allopurinol 100 mg.  2. Buspirone 15 mg b.i.d.  3. Aspirin 325 p.o. daily.  4. Colchicine 0.6 mg daily.  5. Diltiazem 360 mg p.o. daily.  6. Vistaril 25 p.o. daily.  7. Zoloft 100 mg daily.  8. Benazepril 40 mg daily.  9. Toprol 25 p.o. daily.  10.Wellbutrin 150 b.i.d.  11.Trazodone 50 at bedtime.   PHYSICAL EXAMINATION:  VITALS:  Blood pressure 160/100, then increased  to 210/110; heart rate of 56; respiration 20; temperature 97.9; sat 100%  on 2 L.  GENERAL:  The patient was in mild distress.  He  was diaphoretic, profuse  diaphoresis.  CARDIOVASCULAR:  S1, S2.  No JVD.  No murmur.  Bradycardic.  LUNGS:  Bilateral movement, bilateral rhonchi.  No wheezing.  No rales.  ABDOMEN:  Bowel sounds positive.  Soft, nondistended, nontender.  EXTREMITIES:  Pulse positive.  No edema.   LABORATORY DATA:  White blood cells 7.7, hemoglobin 14.3, hematocrit  43.1, platelets 175.  Troponin 0.05, myoglobin 51.8, CK-MB less than 1.  Chest x-ray, no acute disease, no significant changes. EKG without  ischemia.   ASSESSMENT AND PLAN:  1. Chest pain:  Atypical chest pain, differential for this, acute      coronary syndrome in a patient with multiple risk factors with a      known coronary artery disease, but EKG on the ED did not show any      ST segment elevation or depression.  No sign of ischemia.  First      set of cardiac enzymes negative.  We will continue to cycle cardiac      enzymes and obtain serial  EKGs.  I agree with aspirin and      nitroglycerin drip.  Also in the differential is pulmonary      embolism.  His wells score is less than 0.  He is not tachycardic.       No hypoxemia and no recent surgery.  His chest pain could be      secondary to cocaine use.  I agree with UDS.  Also in the      differential is aortic dissection.  We are going to check a CT      chest and abdomen to rule out any dissection. Also GI origin,      esophagitis, gastritis is also in the differential with his prior      history of alcohol.  We will start on Protonix IV.  Waiting for the      result of the liver function tests, we will check lipase, amylase.   1. Hypertension:  We will give one dose of hydralazine 10 mg IV x1.      We will continue with the nitroglycerin drip as well.   Thank you for the consultation.  We will follow with you.      Hartley Barefoot, MD  Electronically Signed      Verne Carrow, MD  Electronically Signed    BR/MEDQ  D:  05/01/2008  T:  05/02/2008  Job:  [id number]

## 2010-08-11 NOTE — Cardiovascular Report (Signed)
NAMETERRENCE, WISHON                 ACCOUNT NO.:  [id number]   MEDICAL RECORD NO.:  [id number]          PATIENT TYPE:  INP   LOCATION:  2004                         FACILITY:  [facility]   PHYSICIAN:  Bevelyn Buckles. Bensimhon, MDDATE OF BIRTH:  11/15/1955   DATE OF PROCEDURE:  05/03/2008  DATE OF DISCHARGE:                            CARDIAC CATHETERIZATION   PATIENT IDENTIFICATION:  [name] is a 55 year old male with multiple  cardiac risk factors, including severe hypertension, history of tobacco  abuse which he recently quit, hyperlipidemia and previous polysubstance  abuse with cocaine and alcohol, which he reports that he quit many years  ago.  He was admitted with severe chest pain.  Cardiac markers have been  normal.  Echocardiogram showed an EF of 60% with no wall motion  abnormalities.  He had a Myoview which showed an EF of 54% with a  question of inferior and inferolateral infarct with mild peri-infarct  ischemia.  He is thus referred for cardiac catheterization.   PROCEDURES PERFORMED:  1. Selective coronary angiography.  2. Left heart catheterization.  3. Left ventriculogram.  4. Abdominal aortogram to assess his renal arteries.  5. Femoral artery StarClose closure.   DESCRIPTION OF PROCEDURE:  The risks and indication of the  catheterization were explained.  Consent was signed and placed on the  chart.  A 5-French arterial sheath was placed in the right femoral  artery using modified Seldinger technique.  Standard catheters were used  including a JL-4, JR-4 and angled pigtail.  All catheter exchanges were  made over wire.  There are no apparent complications.   Of note, during the procedure the patient became very hypertensive with  a central aortic pressure of 210/100 with a mean of 146.  He was  extremely diaphoretic.  He was treated by increasing his IV  nitroglycerin drip to 40 micrograms a minute.  He was also given 20 mg  of IV labetalol in a staged fashion and 10 mg  of IV hydralazine.   Central aortic pressure was 210/101 with a mean of 146, LV was 184/40  with EDP of 11.  That was after antihypertensive medications were given.   Left main was normal.   LAD was a large vessel wrapping the apex.  It gave off to moderate-to-  large diagonals.  In the proximal LAD, there was a 30% lesion.  Just  after the takeoff of a septal perforator, there was a 50-60% focal  lesion.  At first this seemed somewhat hazy, however, we took numerous  pictures of this and after further investigation appeared to about 50-  60% stenosed.  There was an 80-90% lesion in the ostium of this septal  perforator.   Left circumflex was a large vessel that gave off an OM-1, a large  branching OM-2, small OM-3.  There was a 20% lesion in the proximal left  circumflex and a 40% lesion in lesion in the mid AV groove circumflex.   RCA gave off a PDA and a posterolateral.  There was a 40% proximal  lesion, a 20% mid-lesion, a tandem 30% distal  lesion and a 40% lesion in  the PDA.   Left ventriculogram done in the RAO position showed an EF of 60%.  No  regional wall motion abnormalities.  No mitral regurgitation.   Abdominal aortogram showed patent renal arteries bilaterally with no  abdominal aortic aneurysm.   ASSESSMENT:  1. Nonobstructive coronary artery disease.  2. Normal left ventricular function.  3. Severe hypertension with and diaphoresis in the cath lab.  Question      substance abuse withdrawal.   PLAN:  We have reviewed the LAD lesion in numerous views and this does  not appear flow-limiting and certainly not the source of his resting  chest pain.  His Myoview does not show any ischemia in this area, thus I  do not think this is a hemodynamically significant lesion.  At this  point, I would focus on risk factor management and control of his blood  pressure and also consideration of substance abuse withdrawal syndrome.  I have reviewed the LAD lesion with Dr.  Juanda Chance who agrees.      Bevelyn Buckles. Bensimhon, MD  Electronically Signed     DRB/MEDQ  D:  05/03/2008  T:  05/03/2008  Job:  [id number]

## 2010-08-14 NOTE — H&P (Signed)
[name], [name]                 ACCOUNT NO.:  [id number]   MEDICAL RECORD NO.:  [id number]          PATIENT TYPE:  INP   LOCATION:  2904                         FACILITY:  [facility]   PHYSICIAN:  Lonia Blood, M.D.      DATE OF BIRTH:  [birth year]   DATE OF ADMISSION:  01/17/2006  DATE OF DISCHARGE:                                HISTORY & PHYSICAL   PRIMARY CARE PHYSICIAN:  The patient is currently unassigned.   PRESENTING COMPLAINT:  Chest pain.   HISTORY OF PRESENT ILLNESS:  The patient is a 55 year old gentleman with a  known history of hypertension and gout who has been doing well until this  past Saturday when he started having chest pain.  The pain was located in  the epigastric and the precordial region, radiating towards his neck.  It  was a crescendo and decrescendo type of feeling associated with some  fluttering of his heart and palpitations.  He was managing the pain until  today when it became so unbearable that he decided to come to the emergency  room.  He described the pain on a level at 8 out of 10.  It is currently  down to 2 to 3 out of 10 with nitro and morphine.  He has had a similar  issue of pain a long time ago, but that did not last long.  The patient has  used cocaine and other substances, but quit 6 months ago.  This past Friday,  however, he was out drinking with friends and tried to do [facility] and ended up  also with cocaine in his system.  He had some diaphoresis and nausea, but no  vomiting.   PAST MEDICAL HISTORY:  1. Polysubstance abuse, mainly cocaine and THC.  2. Hypertension.  3. Gout.   ALLERGIES:  NO KNOWN DRUG ALLERGIES.   CURRENT MEDICATIONS:  The patient is supposed to be on allopurinol and  clonidine, but he only takes Aleve as needed.   SOCIAL HISTORY:  He is single.  He smokes about 1/2 to 1 pack per day.  He  uses both cocaine and cannabis.  He is currently unemployed.  He also uses  alcohol.   FAMILY HISTORY:  His mom died at the age  of 20.  She had 3 bypass surgeries,  which started when she was in her 32s.  His dad died at the age of 76 from a  heart attack.   REVIEW OF SYSTEMS:  A 12-point review of systems is noted per HPI.   PHYSICAL EXAMINATION:  VITAL SIGNS:  Temperature 99.0.  Blood pressure  158/111 on the right, as well as left side was 160s over 140s.  His pulse is  89.  Respiration rate 20.  Sat is 100% on room air.  GENERAL:  He is awake, alert, and oriented.  He seems to be in mild distress  on arrival.  Currently, no acute distress.  HEENT:  PERRLA.  EOMI.  Neck is supple.  No JVD.  No lymphadenopathy.  RESPIRATORY:  He has good air entry bilaterally.  No  wheezes.  No rales.  CARDIOVASCULAR SYSTEM:  He has an irregular rhythm.  ABDOMEN:  Soft, obese, and nontender, with positive bowel sounds.  EXTREMITIES:  No edema, cyanosis, or clubbing.   LABORATORY DATA:  Labs show sodium of 140, potassium 4.5, chloride 107, BUN  21, glucose 98, creatinine 1.7.  Initial cardiac enzymes are negative.  Alcohol level less than 5.  Urine drug screen is positive for cocaine and  tetrahydrocannabinol.  Chest x-ray showed no acute findings.  His EKG showed  atrial fibrillation with a rate of 80.  There appears to be ST-segment  elevation.   ASSESSMENT:  This is a 55 year old gentleman presenting with new-onset  atrial fibrillation, chest pain, and also cocaine on board.  I fear that the  patient may be having some cocaine-induced chest pain.  The patient also has  a strong family history of hypertension, etc., and new onset atrial  fibrillation.   PLAN:  1. Chest pain.  Will admit the patient and get cardiology involved right      away.  The patient may be having unstable angina at most, but also will      cycle his enzymes, start him on IV nitroglycerin, and also IV heparin.      I will also get him on some morphine while waiting for cardiology      input.  2. Atrial fibrillation.  This is new for the patient.   We will start      anticoagulation right now.  This will be pending other cardiology      orders and will ultimately initiate Coumadin probably.  3. Polysubstance abuse.  Due to the patient's cocaine in body, we will      avoid beta blockers.  However, we will counsel the patient once more.      He had quit 6 months ago until this weekend, according to him.  He      needs to do more than that.  4. Gout.  The patient has taken Aleve and feels like he has renal      insufficiency.  We will hold off on those medications and treat as      needed.  5. Renal insufficiency.  Creatinine of 1.7 could be pre-renal.  It could      be chronic from his hypertension and cocaine use.  We will follow his      renal function in the hospital closely.  Otherwise, the patient will go      to telemetry and follow treatment per cardiology.      Lonia Blood, M.D.  Electronically Signed     LG/MEDQ  D:  01/17/2006  T:  01/18/2006  Job:  [id number]

## 2010-08-14 NOTE — Discharge Summary (Signed)
NAMEJOS, [name]                 ACCOUNT NO.:  [id number]   MEDICAL RECORD NO.:  [id number]          PATIENT TYPE:  INP   LOCATION:  2917                         FACILITY:  [facility]   PHYSICIAN:  Wayne A. Sheffield Slider, M.D.    DATE OF BIRTH:  [birth year]   DATE OF ADMISSION:  05/01/2008  DATE OF DISCHARGE:  05/04/2008                               DISCHARGE SUMMARY   PRIMARY CARE PHYSICIAN:  Dineen Kid. Reche Dixon, MD, [facility]   DISCHARGE DIAGNOSES:  1. Chest pain.  2. Hypertension.  3. Anxiety.  4. Gout.  5. History of atrial fibrillation.  6. Polysubstance abuse.   DISCHARGE MEDICATIONS:  1. Aspirin 81 mg daily.  2. Allopurinol 100 mg daily.  3. Multivitamin 1 tablet p.o. daily.  4. Zoloft 100 mg daily.  5. Trazodone 50 mg one p.o. at bedtime.  6. BuSpar 10 mg 1 tablet twice daily.  7. Coreg 12.5 mg b.i.d.  8. Lisinopril 10 mg daily.   CONSULTATIONS:  Cardiology.   PROCEDURES:  1. Cardiac catheterization on May 03, 2008 showed nonobstructive      coronary artery disease, ejection fraction 60%.  2. Adenosine Myoview shows a mild scar in the inferior lateral wall      and mild peri-infarct ischemia with mild hypokinesis of the septum      and inferior wall, ejection fraction estimated at 54%.  3. CT chest and abdomen:  Mild atherosclerosis.  No evidence of      aneurysm or dissection.  No likely significant vessel branch      narrowing.  4. Chest x-ray:  No acute disease.  No significant change from      previous.   LABORATORY DATA:  1. Cardiac markers negative x3.  2. D-dimer 0.33.  3. Lipase 27.  4. TSH 3.995.  5. Hemoglobin A1c 5.8.  6. Lipid profile:  Total 90, triglycerides 30, HDL 25, and LDL 59.  7. Urine drug screen:  Positive for opiates and THC.  Alcohol level 9.  8. Creatinine on discharge 1.13.   BRIEF HOSPITAL COURSE:  In brief, this is a 55 year old white male with  a history of nonobstructive coronary artery disease, hypertension who  comes in with  hypertensive urgency and chest pain.  1. Chest pain.  The patient was admitted for rule out MI protocol      given his past medical history and strong family history.  The      patient had negative cardiac enzymes and was taken for stress      testing and cardiac catheterization as noted above.  The patient      had resolution of chest pain and Cardiology does not feel like his      cath shows a hemodynamically significant lesion.  At this time, we      will focus on risk factor management and control of blood pressure.  2. Hypertension.  The patient was admitted with a blood pressure max      of 177/97.  He was started on nitroglycerin drip for control of his      blood pressure.  Possible etiology, drug withdrawal.  By discharge,      the patient's blood pressure was under good control with p.o.      medications.  3. Anxiety and depression.  The patient was continued on his home      doses of Zoloft, BuSpar, and trazodone.  4. Gout.  The patient was continued on allopurinol.  5. History of substance abuse.  The patient's beta-blocker was held      due to history of cocaine use, although this was not positive      during this admission.  The patient without overt signs of      withdrawal prior to discharge.  6. Atrial fibrillation.  The patient has a questionable history of      atrial fibrillation and is currently not on any treatment.  Given      that the patient has poor followup, we will not start Coumadin at      this time and may consider treatment as an outpatient with his PCP.   FOLLOWUP APPOINTMENTS:  Dr. Reche Dixon at [facility] on [address] on  May 28, 2008 at 2:15 p.m.   DISCHARGE CONDITION:  The patient discharged to home in stable  condition.      Delbert Harness, MD  Electronically Signed      Arnette Norris. Sheffield Slider, M.D.  Electronically Signed    KB/MEDQ  D:  05/12/2008  T:  05/13/2008  Job:  [id number]   cc:   [facility]

## 2010-08-14 NOTE — Consult Note (Signed)
[name], [name]                 ACCOUNT NO.:  [id number]   MEDICAL RECORD NO.:  [id number]          PATIENT TYPE:  INP   LOCATION:  2904                         FACILITY:  [facility]   PHYSICIAN:  Pricilla Riffle, MD, FACCDATE OF BIRTH:  12/12/1955   DATE OF CONSULTATION:  01/17/2006  DATE OF DISCHARGE:                                   CONSULTATION   IDENTIFICATION:  The patient is a 55 year old whom we are asked to see  regarding chest pressure, EKG changes.   HISTORY OF PRESENT ILLNESS:  Patient is 55 years old.  No prior history of  coronary artery disease (CAD).  He notes he woke up this morning feeling his  heart racing, which was new.  Also noted some chest pressure, which, again,  was new.  Came to the emergency room.  Given nitroglycerin x3 with some  easing off at about 5:45.  Again, this was early in the morning. About 5:45  this evening he noted recurrent chest pressure (left arm radiating to left  chest), positive shortness of breath.   No history of chest pain in the past, fluttering, although he did have a  catheterization 25 years ago.  He notes increased shortness of breath,  increasing dyspnea recently.  Has a history of cocaine on Friday.  None  since six months prior.  Stopped antihypertensives three weeks ago.   ALLERGIES:  None.   MEDICATIONS:  Aleve.  Stopped clonidine, allopurinol.   PAST MEDICAL HISTORY:  1. Hypertension, treated times years, stopped three weeks ago.  2. Gout.  3. Cocaine use.  The last use was on Friday.  4. Tobacco use.   PAST SURGICAL HISTORY:  Question hernia repair.   SOCIAL HISTORY:  The patient is separated, lives in [place] alone.  Works  on houses.  Smokes 1/2 pack per day for five years.  Drinks 3 beers on  Friday.  Notes cocaine use intermittently.   FAMILY HISTORY:  Mother died of a myocardial infarction (MI).   REVIEW OF SYSTEMS:  More short of breath recently.  Otherwise all systems  reviewed, negative to problems.   Denies ulcers.   PHYSICAL EXAMINATION:  GENERAL:  The patient is in minimal distress,  complaining of 3-4/10 chest pressure.  VITAL SIGNS:  Blood pressure is 161/112, pulse is 80s to 90s and irregular,  respiratory rate is 16.  HEENT:  Normocephalic and atraumatic.  Pupils equal, round, and reactive to  light (PERRL).  Extraocular movements are intact (EOMI).  NECK:  No jugular venous distention (JVD), no bruits.  LUNGS:  Crackles at bases.  CARDIAC:  Cardiac exam irregularly irregular.  S1, S2, no S3.  No murmurs.  ABDOMEN:  Abdomen is benign.  No hepatosplenomegaly.  EXTREMITIES:  Good distal pulses.  No edema.  NEUROLOGIC:  Alert and oriented x3.  Cranial nerves II-XII are grossly  intact.   LABORATORY DATA:  Significant for a BUN and creatinine of 21 and 1.7,  potassium 4.5, troponin less than 0.05 x2, cocaine positive.  A 12-lead EKG  at 11:21 a.m. shows atrial fibrillation at 88 beats  per minute, ST elevation  V3 through V5, 1-2 mm, question J point V3, V4, V5 more horizontal,  suspicious for injury.  Again, slightly different than March 2006.  At 1826  hours, changes persist.   IMPRESSION:  This is a 55 year old with a history of hypertension, tobacco  use, cocaine use.  Noted fluttering and chest pressure this morning, which  was new.  EKG with lateral ST changes, worrisome for injury.  Pain eased but  now back.  Atrial fibrillation is new.   RECOMMENDATIONS:  Recommend cardiac catheterization to define anatomy.  Risks and benefits explained.  Patient understands and agrees to proceed.  With renal function, we will schedule echocardiogram.  Also important was  atrial fibrillation.   We will need blood pressure therapy, check cholesterol, check PFTs, heparin.  We will need to decide on Coumadin based on findings above.           ______________________________  Pricilla Riffle, MD, [facility]     PVR/MEDQ  D:  01/17/2006  T:  01/18/2006  Job:  [id number]

## 2010-08-14 NOTE — Discharge Summary (Signed)
[name], [name]                 ACCOUNT NO.:  [id number]   MEDICAL RECORD NO.:  [id number]          PATIENT TYPE:  INP   LOCATION:  2904                         FACILITY:  [facility]   PHYSICIAN:  Michaelyn Barter, M.D. DATE OF BIRTH:  09-25-1955   DATE OF ADMISSION:  01/17/2006  DATE OF DISCHARGE:  01/19/2006                                 DISCHARGE SUMMARY   FINAL DIAGNOSES:  1. Chest pain.  2. Urine drug screen positive for cocaine.  3. Mild nonobstructive coronary artery disease.  4. Hyperdynamic left ventricular function with no obvious wall motion      abnormalities.  5. Atrial fibrillation.  6. Uncontrolled hypertension.   The patient's primary care physician is [facility].  Therefore, he is  unassigned.   CONSULTATIONS:  With cardiology, [facility].   HISTORY OF PRESENT ILLNESS:  [name] is a 55 year old gentleman who  arrived with a chief complaint of chest pain.  The patient stated that he  woke up with chest discomfort 2 days prior to this admission after consuming  alcohol and smoking marijuana.  He reported that he felt fine Friday night,  but some time after that he blacked out and cannot remember anything.  This  occurred after smoking.  His episode of blacking out appears to have been a  short episode.  On the day of his admission, he actually went to work.  He  felt very uncomfortable.  He felt as though his heart was fluttering.  He  went to the local fire department.  His blood pressure was taken.  It was  found to be 196 systolically.  He was therefore brought to the emergency  department by the EMS.  He admitted to diaphoresis, dyspnea, nausea and  headache.  He complained of left-sided chest pain without radiation to the  arm or neck.  For past medical history, please see that dictated by Dr.  Lonia Blood.   HOSPITAL COURSE:  1. Chest pain. The patient was started on IV nitroglycerin and provided      morphine sulfate p.r.n. Cardiology was consulted.   Dr. Tenny Craw saw the      patient.  She recommended a cardiac catheterization to define the      anatomy.  On October 22, the patient underwent a cardiac      catheterization performed by Dr. Arvilla Meres.  His final      assessment was that the patient had mild nonobstructive coronary artery      disease.  A hyperdynamic left ventricular function with no obvious wall      motion abnormalities was also seen.  He stated that he suspected the      patient's chest pain was related to his atrial fibrillation and poorly      controlled hypertension.  The patient has indicated that his symptoms      have improved.  However, it has not completely remitted.  He has not      complained of any shortness of breath.  2. Atrial fibrillation.  The patient was started on Cardizem during the  course of this hospitalization.  His heart rate has been controlled.      Some consideration to starting Coumadin was discussed.  Cardiology      indicated that Coumadin was contraindicated secondary to the patient's      history of noncompliance and his drug abuse.  They stated that the      patient's stroke risk was low in the absence of advanced age,      structural heart disease, and diabetes.  The only risk factor that the      patient had in addition to his atrial fibrillation was hypertension.      They went on to state that regardless of the risk they did not think he      was reliable to make commitment necessary for warfarin therapy.      Therefore, the patient was not started on any anticoagulation.  3. History of cocaine abuse.  The patient did openly admit that he had      been struggling with the use of crack cocaine at the time of his      admission.  A urine drug screen was completed and was found to be      positive for cocaine and tetrahydrocannabinoids.  The patient has been      counseled that he needs to discontinue using these substances.  4. Hypertension.  Attempts to control this has been  undertaken, and as of      today January 19, 2006, the patient's blood pressure appears to be well-      controlled.  The decision has been made after review of cardiology's      note to discharge the patient home.  The patient will be discharged      home on the following medications:  5. Thiamine 1 mg p.o. q.d.  6. Ecotrin 325 mg p.o. q.d.  7. Catapres 0.1 mg p.o. b.i.d.  8. Cardizem CD 240 mg p.o. q.d.   The patient will be instructed to follow-up with his primary care physician  within 1 week and to avoid the use of cocaine.      Michaelyn Barter, M.D.  Electronically Signed     OR/MEDQ  D:  01/19/2006  T:  01/20/2006  Job:  [id number]

## 2010-08-14 NOTE — Cardiovascular Report (Signed)
[name], [name]                 ACCOUNT NO.:  [id number]   MEDICAL RECORD NO.:  [id number]          PATIENT TYPE:  INP   LOCATION:  2904                         FACILITY:  [facility]   PHYSICIAN:  Bevelyn Buckles. Bensimhon, MDDATE OF BIRTH:  1955-07-31   DATE OF PROCEDURE:  01/17/2006  DATE OF DISCHARGE:                              CARDIAC CATHETERIZATION   PATIENT IDENTIFICATION:  [name] is a 55 year old male with a history of  severe hypertension, ongoing tobacco use and as well as cocaine use who was  admitted to the emergency room with chest pressure and palpitations.  He was  noted to be in apparently new onset atrial fibrillation with a relatively  well-controlled ventricular rate.  Initial set of cardiac markers were  negative; however, he continued to have stuttering chest pain throughout the  day.  EKG showed some equivocal ST elevation in the lateral leads.  He is  thus brought to the diagnostic catheterization lab.   PROCEDURES PERFORMED:  1. Selective coronary angiography.  2. Left heart catheterization.  3. Left ventriculogram.  4. AngioSeal femoral closure.   DESCRIPTION OF PROCEDURE:  The risks and benefits of the procedure were  explained.  Consent was signed and placed on the chart.  A 5-French arterial  sheath was placed in the right femoral artery using the modified Seldinger  technique.  Standard catheters including JL-4 and JR-4 were used for  angiography.  We performed vein injection in the LV with the JR-4 catheter.  All catheter exchanges were made over wire.  There were no apparent  complications. At the end the procedure, the patient had his right femoral  arteriotomy site closed with Angio-Seal closure device.  There was good  hemostasis.   TOTAL CONTRAST USED:  75 mL of Omnipaque.   Central aortic pressure 139/97 with mean of 118.  LV pressure 146/80 with  LVEDP of 18.  There was no aortic stenosis.   Left main was angiographically normal.   LAD is a  long vessel wrapping the apex.  It gives off 2 moderate-size  diagonals.  There was some mild calcification in the proximal vessel but no  flow-limiting stenoses.   Left circumflex is a moderate to large-size system.  It gives off a tiny  ramus branch and a large branching OM-1.  There is a 30-40% smooth lesion in  the mid section of the circumflex.   Right coronary artery is a large dominant vessel that gives off an RV  branch, a large PDA and a large posterolateral.  There is a 20% tubular  stenosis proximally of 30-40% stenosis in the midsection.  In the ostium of  the PDA, there is a 50% lesion.  Following this in the proximal portion of  the PDA, there is 30-40% focal lesion.   Left ventriculogram done in the RAO position showed an EF of 75% with no  evidence of wall motion abnormalities.   ASSESSMENT:  1. Mild nonobstructive coronary artery disease.  2. Hyperdynamic left ventricular function with no obvious wall motion      abnormalities.   PLAN/DISCUSSION:  I suspect his chest pain is related to his atrial  fibrillation and poorly controlled hypertension.  We will continue with  aggressive medical therapy.      Bevelyn Buckles. Bensimhon, MD  Electronically Signed     DRB/MEDQ  D:  01/17/2006  T:  01/18/2006  Job:  [id number]

## 2010-12-18 LAB — POCT CARDIAC MARKERS
CKMB, poc: 1.1
CKMB, poc: <1 — ABNORMAL LOW
Myoglobin, poc: 123
Myoglobin, poc: 85.7
Operator id: 284251
Operator id: 285491
Troponin i, poc: <0.05
Troponin i, poc: <0.05

## 2010-12-18 LAB — BASIC METABOLIC PANEL
BUN: 10
CO2: 26
Calcium: 9.2
Chloride: 106
Creatinine, Ser: 1.05
GFR calc Af Amer: >60
GFR calc non Af Amer: >60
Glucose, Bld: 73
Potassium: 4.5
Sodium: 140

## 2010-12-18 LAB — CBC
HCT: 44.7
Hemoglobin: 15.3
MCHC: 34.2
MCV: 94
Platelets: 193
RBC: 4.76
RDW: 14.2
WBC: 9.1

## 2010-12-18 LAB — DIFFERENTIAL
Basophils Absolute: 0
Basophils Relative: 1
Eosinophils Absolute: 0.1
Eosinophils Relative: 2
Lymphocytes Relative: 23
Lymphs Abs: 2.1
Monocytes Absolute: 1
Monocytes Relative: 11
Neutro Abs: 5.7
Neutrophils Relative %: 63

## 2010-12-18 LAB — D-DIMER, QUANTITATIVE (NOT AT ARMC): D-Dimer, Quant: 0.29

## 2011-01-07 LAB — URINALYSIS, ROUTINE W REFLEX MICROSCOPIC
Bilirubin Urine: NEGATIVE
Glucose, UA: NEGATIVE
Hgb urine dipstick: NEGATIVE
Ketones, ur: NEGATIVE
Nitrite: NEGATIVE
Protein, ur: NEGATIVE
Specific Gravity, Urine: 1.008
Urobilinogen, UA: 0.2
pH: 7

## 2011-01-07 LAB — I-STAT 8, (EC8 V) (CONVERTED LAB)
Acid-Base Excess: 1
BUN: 11
Bicarbonate: 28.1 — ABNORMAL HIGH
Chloride: 104
Glucose, Bld: 88
HCT: 44
Hemoglobin: 15
Operator id: 294521
Potassium: 4.3
Sodium: 141
TCO2: 30
pCO2, Ven: 52.2 — ABNORMAL HIGH
pH, Ven: 7.338 — ABNORMAL HIGH

## 2011-01-07 LAB — POCT CARDIAC MARKERS
CKMB, poc: <1 — ABNORMAL LOW
Myoglobin, poc: 83.1
Operator id: 294521
Troponin i, poc: <0.05

## 2011-01-07 LAB — POCT I-STAT CREATININE
Creatinine, Ser: 1.2
Operator id: 294521

## 2011-05-18 ENCOUNTER — Emergency Department (HOSPITAL_COMMUNITY)
Admission: EM | Admit: 2011-05-18 | Discharge: 2011-05-19 | Disposition: A | Discharge status: Home / Self Care | Payer: Self-pay | Attending: Emergency Medicine | Admitting: Emergency Medicine

## 2011-05-18 ENCOUNTER — Encounter (HOSPITAL_COMMUNITY): Payer: Self-pay | Admitting: *Deleted

## 2011-05-18 DIAGNOSIS — M109 Gout, unspecified: Secondary | ICD-10-CM | POA: Insufficient documentation

## 2011-05-18 DIAGNOSIS — E119 Type 2 diabetes mellitus without complications: Secondary | ICD-10-CM | POA: Insufficient documentation

## 2011-05-18 DIAGNOSIS — Z8673 Personal history of transient ischemic attack (TIA), and cerebral infarction without residual deficits: Secondary | ICD-10-CM | POA: Insufficient documentation

## 2011-05-18 DIAGNOSIS — F0390 Unspecified dementia without behavioral disturbance: Secondary | ICD-10-CM | POA: Insufficient documentation

## 2011-05-18 DIAGNOSIS — I10 Essential (primary) hypertension: Secondary | ICD-10-CM | POA: Insufficient documentation

## 2011-05-18 DIAGNOSIS — M79609 Pain in unspecified limb: Secondary | ICD-10-CM | POA: Insufficient documentation

## 2011-05-18 HISTORY — DX: Unspecified osteoarthritis, unspecified site: M19.90

## 2011-05-18 HISTORY — DX: Anxiety disorder, unspecified: F41.9

## 2011-05-18 HISTORY — DX: Major depressive disorder, single episode, unspecified: F32.9

## 2011-05-18 HISTORY — DX: Depression, unspecified: F32.A

## 2011-05-18 HISTORY — DX: Essential (primary) hypertension: I10

## 2011-05-18 HISTORY — DX: Transient cerebral ischemic attack, unspecified: G45.9

## 2011-05-18 HISTORY — DX: Unspecified dementia, unspecified severity, without behavioral disturbance, psychotic disturbance, mood disturbance, and anxiety: F03.90

## 2011-05-18 MED ORDER — OXYCODONE-ACETAMINOPHEN 5-325 MG PO TABS
2.0000 | ORAL_TABLET | Freq: Once | ORAL | Status: AC
  Administered 2011-05-18: 2 via ORAL
  Filled 2011-05-18: qty 2

## 2011-05-18 MED ORDER — COLCHICINE 0.6 MG PO TABS: 0.6000 mg | ORAL_TABLET | Freq: Every day | ORAL | Status: DC

## 2011-05-18 MED ORDER — KETOROLAC TROMETHAMINE 60 MG/2ML IM SOLN
60.0000 mg | Freq: Once | INTRAMUSCULAR | Status: AC
  Administered 2011-05-18: 60 mg via INTRAMUSCULAR
  Filled 2011-05-18: qty 2

## 2011-05-18 MED ORDER — OXYCODONE-ACETAMINOPHEN 5-325 MG PO TABS: 1.0000 | ORAL_TABLET | Freq: Four times a day (QID) | ORAL | Status: AC | PRN

## 2011-05-18 NOTE — ED Provider Notes (Signed)
History     CSN: [id number]  Arrival date & time 05/18/11  1725   First MD Initiated Contact with Patient 05/18/11 2218      Chief Complaint  Patient presents with  . Foot Pain    (Consider location/radiation/quality/duration/timing/severity/associated sxs/prior treatment) HPI  The patient presents to the emergency department for a episode of gout. The patient has a long history of gout and normally takes indomethacin for it. However, the indomethacin is not controlling his pain tonight. The patient states that when he is gout gets uncontrollable usually a shot of Toradol helps. He is requesting a shot of Toradol. He denies any other symptoms such as fever, nausea, chills, weakness. He denies injury to the foot, he denies history of infection. He states that this pain feels exactly like his normal gout pain and that he has had it in this same spot before.   Past Medical History  Diagnosis Date  . Gout   . Arthritis   . Diabetes mellitus   . Depression   . Dementia   . Anxiety   . TIA (transient ischemic attack)   . Hypertension     Past Surgical History  Procedure Date  . Hernia repair     No family history on file.  History  Substance Use Topics  . Smoking status: Current Everyday Smoker -- 0.5 packs/day  . Smokeless tobacco: Not on file  . Alcohol Use: No     former, last 5 yrs      Review of Systems  All other systems reviewed and are negative.    Allergies  Review of patient's allergies indicates no known allergies.  Home Medications   Current Outpatient Rx  Name Route Sig Dispense Refill  . AMITRIPTYLINE HCL 25 MG PO TABS Oral Take 25 mg by mouth at bedtime.    Marland Kitchen AMLODIPINE BESYLATE 5 MG PO TABS Oral Take 5 mg by mouth daily.    Marland Kitchen CLONIDINE HCL 0.1 MG PO TABS Oral Take 0.1 mg by mouth 3 (three) times daily.    . INDOMETHACIN 50 MG PO CAPS Oral Take 50 mg by mouth daily.    Marland Kitchen LISINOPRIL-HYDROCHLOROTHIAZIDE 20-12.5 MG PO TABS Oral Take 1 tablet by  mouth daily.    Marland Kitchen LORAZEPAM 1 MG PO TABS Oral Take 1 mg by mouth every morning.    . NEBIVOLOL HCL 10 MG PO TABS Oral Take 10 mg by mouth daily.    Marland Kitchen NITROGLYCERIN 0.4 MG SL SUBL Sublingual Place 0.4 mg under the tongue every 5 (five) minutes as needed. For chest pain    . QUETIAPINE FUMARATE ER 400 MG PO TB24 Oral Take 400 mg by mouth at bedtime.    . SERTRALINE HCL 100 MG PO TABS Oral Take 100 mg by mouth daily.    . OXYCODONE-ACETAMINOPHEN 5-325 MG PO TABS Oral Take 1 tablet by mouth every 6 (six) hours as needed for pain. 12 tablet 0    BP 183/116  Pulse 60  Temp(Src) 97.9 F (36.6 C) (Oral)  Resp 20  SpO2 97%  Physical Exam  Constitutional: He is oriented to person, place, and time. He appears well-developed and well-nourished.  HENT:  Head: Normocephalic and atraumatic.  Eyes: EOM are normal. Pupils are equal, round, and reactive to light.  Neck: Normal range of motion.  Cardiovascular: Normal rate and regular rhythm.   Pulmonary/Chest: Effort normal.  Musculoskeletal: Normal range of motion. He exhibits tenderness.       Left ankle: He exhibits  swelling. He exhibits normal range of motion, no ecchymosis, no deformity, no laceration and normal pulse. tenderness. Achilles tendon normal.       Feet:  Neurological: He is oriented to person, place, and time.  Skin: Skin is warm and dry.    ED Course  Procedures (including critical care time)  Labs Reviewed - No data to display No results found.   1. Gout       MDM  Pt given 60mg  IM Toradol and a Rx for Percocet 5-325 (12 tabs) pt states that he is from [place] and therefore does not have follow-up with a PCP in the next couple days. Pt will return to the ED if symptoms change or worsen.         Dorthula Matas, PA 05/18/11 2304

## 2011-05-18 NOTE — ED Notes (Signed)
C/o L lateral foot pain and redness. Onset Saturday morning. H/o gout & arthritis. H/o same with R foot. Woke him from sleep Saturday morning. No relief with soaking in Epsom salts. Takes indocin. 10/10 pain.

## 2011-05-18 NOTE — Discharge Instructions (Signed)
Gout Gout is an inflammatory condition (arthritis) caused by a buildup of uric acid crystals in the joints. Uric acid is a chemical that is normally present in the blood. Under some circumstances, uric acid can form into crystals in your joints. This causes joint redness, soreness, and swelling (inflammation). Repeat attacks are common. Over time, uric acid crystals can form into masses (tophi) near a joint, causing disfigurement. Gout is treatable and often preventable. CAUSES  The disease begins with elevated levels of uric acid in the blood. Uric acid is produced by your body when it breaks down a naturally found substance called purines. This also happens when you eat certain foods such as meats and fish. Causes of an elevated uric acid level include:  Being passed down from parent to child (heredity).   Diseases that cause increased uric acid production (obesity, psoriasis, some cancers).   Excessive alcohol use.   Diet, especially diets rich in meat and seafood.   Medicines, including certain cancer-fighting drugs (chemotherapy), diuretics, and aspirin.   Chronic kidney disease. The kidneys are no longer able to remove uric acid well.   Problems with metabolism.  Conditions strongly associated with gout include:  Obesity.   High blood pressure.   High cholesterol.   Diabetes.  Not everyone with elevated uric acid levels gets gout. It is not understood why some people get gout and others do not. Surgery, joint injury, and eating too much of certain foods are some of the factors that can lead to gout. SYMPTOMS   An attack of gout comes on quickly. It causes intense pain with redness, swelling, and warmth in a joint.   Fever can occur.   Often, only one joint is involved. Certain joints are more commonly involved:   Base of the big toe.   Knee.   Ankle.   Wrist.   Finger.  Without treatment, an attack usually goes away in a few days to weeks. Between attacks, you  usually will not have symptoms, which is different from many other forms of arthritis. DIAGNOSIS  Your caregiver will suspect gout based on your symptoms and exam. Removal of fluid from the joint (arthrocentesis) is done to check for uric acid crystals. Your caregiver will give you a medicine that numbs the area (local anesthetic) and use a needle to remove joint fluid for exam. Gout is confirmed when uric acid crystals are seen in joint fluid, using a special microscope. Sometimes, blood, urine, and X-ray tests are also used. TREATMENT  There are 2 phases to gout treatment: treating the sudden onset (acute) attack and preventing attacks (prophylaxis). Treatment of an Acute Attack  Medicines are used. These include anti-inflammatory medicines or steroid medicines.   An injection of steroid medicine into the affected joint is sometimes necessary.   The painful joint is rested. Movement can worsen the arthritis.   You may use warm or cold treatments on painful joints, depending which works best for you.   Discuss the use of coffee, vitamin C, or cherries with your caregiver. These may be helpful treatment options.  Treatment to Prevent Attacks After the acute attack subsides, your caregiver may advise prophylactic medicine. These medicines either help your kidneys eliminate uric acid from your body or decrease your uric acid production. You may need to stay on these medicines for a very long time. The early phase of treatment with prophylactic medicine can be associated with an increase in acute gout attacks. For this reason, during the first few months   of treatment, your caregiver may also advise you to take medicines usually used for acute gout treatment. Be sure you understand your caregiver's directions. You should also discuss dietary treatment with your caregiver. Certain foods such as meats and fish can increase uric acid levels. Other foods such as dairy can decrease levels. Your caregiver  can give you a list of foods to avoid. HOME CARE INSTRUCTIONS   Do not take aspirin to relieve pain. This raises uric acid levels.   Only take over-the-counter or prescription medicines for pain, discomfort, or fever as directed by your caregiver.   Rest the joint as much as possible. When in bed, keep sheets and blankets off painful areas.   Keep the affected joint raised (elevated).   Use crutches if the painful joint is in your leg.   Drink enough water and fluids to keep your urine clear or pale yellow. This helps your body get rid of uric acid. Do not drink alcoholic beverages. They slow the passage of uric acid.   Follow your caregiver's dietary instructions. Pay careful attention to the amount of protein you eat. Your daily diet should emphasize fruits, vegetables, whole grains, and fat-free or low-fat milk products.   Maintain a healthy body weight.  SEEK MEDICAL CARE IF:   You have an oral temperature above 102 F (38.9 C).   You develop diarrhea, vomiting, or any side effects from medicines.   You do not feel better in 24 hours, or you are getting worse.  SEEK IMMEDIATE MEDICAL CARE IF:   Your joint becomes suddenly more tender and you have:   Chills.   An oral temperature above 102 F (38.9 C), not controlled by medicine.  MAKE SURE YOU:   Understand these instructions.   Will watch your condition.   Will get help right away if you are not doing well or get worse.  Document Released: 03/12/2000 Document Revised: 11/25/2010 Document Reviewed: 06/23/2009 [employer] Patient Information 2012 [employer]. 

## 2011-05-19 NOTE — ED Provider Notes (Signed)
Medical screening examination/treatment/procedure(s) were performed by non-physician practitioner and as supervising physician I was immediately available for consultation/collaboration.  Flint Melter, MD 05/19/11 1351

## 2011-08-04 ENCOUNTER — Encounter (HOSPITAL_COMMUNITY): Payer: Self-pay | Admitting: *Deleted

## 2011-08-04 ENCOUNTER — Emergency Department (HOSPITAL_COMMUNITY)
Admission: EM | Admit: 2011-08-04 | Discharge: 2011-08-04 | Disposition: A | Discharge status: Home / Self Care | Payer: Self-pay | Attending: Emergency Medicine | Admitting: Emergency Medicine

## 2011-08-04 DIAGNOSIS — E119 Type 2 diabetes mellitus without complications: Secondary | ICD-10-CM | POA: Insufficient documentation

## 2011-08-04 DIAGNOSIS — F17200 Nicotine dependence, unspecified, uncomplicated: Secondary | ICD-10-CM | POA: Insufficient documentation

## 2011-08-04 DIAGNOSIS — I10 Essential (primary) hypertension: Secondary | ICD-10-CM | POA: Insufficient documentation

## 2011-08-04 DIAGNOSIS — M19079 Primary osteoarthritis, unspecified ankle and foot: Secondary | ICD-10-CM | POA: Insufficient documentation

## 2011-08-04 DIAGNOSIS — F411 Generalized anxiety disorder: Secondary | ICD-10-CM | POA: Insufficient documentation

## 2011-08-04 DIAGNOSIS — F329 Major depressive disorder, single episode, unspecified: Secondary | ICD-10-CM | POA: Insufficient documentation

## 2011-08-04 DIAGNOSIS — F3289 Other specified depressive episodes: Secondary | ICD-10-CM | POA: Insufficient documentation

## 2011-08-04 DIAGNOSIS — F0390 Unspecified dementia without behavioral disturbance: Secondary | ICD-10-CM | POA: Insufficient documentation

## 2011-08-04 DIAGNOSIS — M1990 Unspecified osteoarthritis, unspecified site: Secondary | ICD-10-CM

## 2011-08-04 DIAGNOSIS — Z760 Encounter for issue of repeat prescription: Secondary | ICD-10-CM

## 2011-08-04 DIAGNOSIS — Z8673 Personal history of transient ischemic attack (TIA), and cerebral infarction without residual deficits: Secondary | ICD-10-CM | POA: Insufficient documentation

## 2011-08-04 DIAGNOSIS — M109 Gout, unspecified: Secondary | ICD-10-CM | POA: Insufficient documentation

## 2011-08-04 MED ORDER — QUETIAPINE FUMARATE ER 400 MG PO TB24: 400.0000 mg | ORAL_TABLET | Freq: Every day | ORAL | Status: DC

## 2011-08-04 MED ORDER — HYDROCODONE-ACETAMINOPHEN 5-325 MG PO TABS
ORAL_TABLET | ORAL | Status: AC
Start: 2011-08-04 — End: 2011-08-14

## 2011-08-04 MED ORDER — INDOMETHACIN 50 MG PO CAPS: 50.0000 mg | ORAL_CAPSULE | Freq: Every day | ORAL | Status: DC

## 2011-08-04 MED ORDER — SERTRALINE HCL 100 MG PO TABS: 100.0000 mg | ORAL_TABLET | Freq: Every day | ORAL | Status: DC

## 2011-08-04 MED ORDER — HYDROCODONE-ACETAMINOPHEN 5-325 MG PO TABS: 1.0000 | ORAL_TABLET | Freq: Once | ORAL | Status: DC

## 2011-08-04 MED ORDER — CLONIDINE HCL 0.1 MG PO TABS: 0.1000 mg | ORAL_TABLET | Freq: Three times a day (TID) | ORAL | Status: DC

## 2011-08-04 MED ORDER — AMLODIPINE BESYLATE 5 MG PO TABS: 5.0000 mg | ORAL_TABLET | Freq: Every day | ORAL | Status: DC

## 2011-08-04 MED ORDER — LISINOPRIL-HYDROCHLOROTHIAZIDE 20-12.5 MG PO TABS: 1.0000 | ORAL_TABLET | Freq: Every day | ORAL | Status: DC

## 2011-08-04 MED ORDER — AMITRIPTYLINE HCL 25 MG PO TABS: 25.0000 mg | ORAL_TABLET | Freq: Every day | ORAL | Status: DC

## 2011-08-04 MED ORDER — LORAZEPAM 1 MG PO TABS: 1.0000 mg | ORAL_TABLET | ORAL | Status: DC

## 2011-08-04 MED ORDER — NEBIVOLOL HCL 10 MG PO TABS: 10.0000 mg | ORAL_TABLET | Freq: Every day | ORAL | Status: DC

## 2011-08-04 MED ORDER — KETOROLAC TROMETHAMINE 30 MG/ML IJ SOLN: 30.0000 mg | Freq: Once | INTRAMUSCULAR | Status: DC

## 2011-08-04 NOTE — ED Provider Notes (Addendum)
History   This chart was scribed for Gavin Pound. Oletta Lamas, MD by Clarita Crane. The patient was seen in room STRE6/STRE6. Patient's care was started at 1805.    CSN: [id number]  Arrival date & time 08/04/11  1805   First MD Initiated Contact with Patient 08/04/11 1836      Chief Complaint  Patient presents with  . Foot Pain    (Consider location/radiation/quality/duration/timing/severity/associated sxs/prior treatment) HPI [name] is a 56 y.o. male who presents to the Emergency Department complaining of moderate right foot pain localized to lateral aspect of foot onset several days ago and persistent since. Describes pain as similar to that previously experienced with Gout but states gout symptoms normally present within bilateral great toes. Denies recent injury/trauma, numbness, tingling, swelling. Patient with h/o gout, diabetes, dementia, TIA, HTN.  Additionally, patient notes that he is visiting his son that lives in the [place] area for an extended period of time and has been unable to obtain refills of his daily medications.   Past Medical History  Diagnosis Date  . Gout   . Arthritis   . Diabetes mellitus   . Depression   . Dementia   . Anxiety   . TIA (transient ischemic attack)   . Hypertension     Past Surgical History  Procedure Date  . Hernia repair     History reviewed. No pertinent family history.  History  Substance Use Topics  . Smoking status: Current Everyday Smoker -- 0.5 packs/day  . Smokeless tobacco: Not on file  . Alcohol Use: No     former, last 5 yrs      Review of Systems  Constitutional: Negative.   Respiratory: Negative for shortness of breath.   Cardiovascular: Negative for chest pain.  Gastrointestinal: Negative for nausea and vomiting.  Musculoskeletal: Positive for arthralgias. Negative for joint swelling.       Right foot pain.   Skin: Negative for color change and rash.  Neurological: Negative for weakness and numbness.     Allergies  Review of patient's allergies indicates no known allergies.  Home Medications   Current Outpatient Rx  Name Route Sig Dispense Refill  . COLCHICINE 0.6 MG PO TABS Oral Take 1 tablet (0.6 mg total) by mouth daily. 30 tablet 0    Take 1 tab every hour until you develop diarrhea.  ...  . NITROGLYCERIN 0.4 MG SL SUBL Sublingual Place 0.4 mg under the tongue every 5 (five) minutes as needed. For chest pain    . AMITRIPTYLINE HCL 25 MG PO TABS Oral Take 1 tablet (25 mg total) by mouth at bedtime. 30 tablet 0  . AMLODIPINE BESYLATE 5 MG PO TABS Oral Take 1 tablet (5 mg total) by mouth daily. 30 tablet 0  . CLONIDINE HCL 0.1 MG PO TABS Oral Take 1 tablet (0.1 mg total) by mouth 3 (three) times daily. 60 tablet 0  . HYDROCODONE-ACETAMINOPHEN 5-325 MG PO TABS  1-2 tablets po q 6 hours prn moderate to severe pain 15 tablet 0  . INDOMETHACIN 50 MG PO CAPS Oral Take 1 capsule (50 mg total) by mouth daily. 20 capsule 0  . LISINOPRIL-HYDROCHLOROTHIAZIDE 20-12.5 MG PO TABS Oral Take 1 tablet by mouth daily. 30 tablet 0  . LORAZEPAM 1 MG PO TABS Oral Take 1 tablet (1 mg total) by mouth every morning. 20 tablet 0  . NEBIVOLOL HCL 10 MG PO TABS Oral Take 1 tablet (10 mg total) by mouth daily. 30 tablet 0  .  QUETIAPINE FUMARATE ER 400 MG PO TB24 Oral Take 1 tablet (400 mg total) by mouth at bedtime. 30 tablet 0  . SERTRALINE HCL 100 MG PO TABS Oral Take 1 tablet (100 mg total) by mouth daily. 30 tablet 0    BP 141/91  Pulse 94  Temp(Src) 97.7 F (36.5 C) (Oral)  Resp 14  SpO2 97%  Physical Exam  Nursing note and vitals reviewed. Constitutional: He is oriented to person, place, and time. He appears well-developed and well-nourished. No distress.  HENT:  Head: Normocephalic and atraumatic.  Eyes: EOM are normal.  Neck: Neck supple. No tracheal deviation present.  Cardiovascular: Normal rate.   Pulmonary/Chest: Effort normal. No respiratory distress.  Musculoskeletal: Normal range of  motion. He exhibits tenderness.       DP pulse intact within right foot. Tenderness noted to lateral aspect right foot. No deformity, edema, laceration or abrasion.  Stable joint spaces.    Neurological: He is alert and oriented to person, place, and time. He has normal strength. No sensory deficit.       Gross sensation and motor function intact within right foot.   Skin: Skin is warm and dry.  Psychiatric: He has a normal mood and affect. His behavior is normal.    ED Course  Procedures (including critical care time)  DIAGNOSTIC STUDIES: Oxygen Saturation is 97% on room air, normal by my interpretation.    COORDINATION OF CARE: 6:45PM-Patient informed of current plan for treatment and evaluation and agrees with plan at this time.     Labs Reviewed - No data to display No results found.   1. Arthritis   2. Gout   3. Medication refill       MDM  I personally performed the services described in this documentation, which was scribed in my presence. The recorded information has been reviewed and considered.    Pt with non traumatic pain, chronic, acute exacerbation.  No appearance for infection, intact distal neurovascular.  Will give refills of his usual meds as well since pt is unsure how long he will be here in town.    Gavin Pound. Oletta Lamas, MD 08/04/11 1856  Gavin Pound. Janki Dike, MD 08/04/11 1857

## 2011-08-04 NOTE — Discharge Instructions (Signed)
Arthritis, Nonspecific Arthritis is inflammation of a joint. This usually means pain, redness, warmth or swelling are present. One or more joints may be involved. There are a number of types of arthritis. Your caregiver may not be able to tell what type of arthritis you have right away. CAUSES  The most common cause of arthritis is the wear and tear on the joint (osteoarthritis). This causes damage to the cartilage, which can break down over time. The knees, hips, back and neck are most often affected by this type of arthritis. Other types of arthritis and common causes of joint pain include:  Sprains and other injuries near the joint. Sometimes minor sprains and injuries cause pain and swelling that develop hours later.   Rheumatoid arthritis. This affects hands, feet and knees. It usually affects both sides of your body at the same time. It is often associated with chronic ailments, fever, weight loss and general weakness.   Crystal arthritis. Gout and pseudo gout can cause occasional acute severe pain, redness and swelling in the foot, ankle, or knee.   Infectious arthritis. Bacteria can get into a joint through a break in overlying skin. This can cause infection of the joint. Bacteria and viruses can also spread through the blood and affect your joints.   Drug, infectious and allergy reactions. Sometimes joints can become mildly painful and slightly swollen with these types of illnesses.  SYMPTOMS   Pain is the main symptom.   Your joint or joints can also be red, swollen and warm or hot to the touch.   You may have a fever with certain types of arthritis, or even feel overall ill.   The joint with arthritis will hurt with movement. Stiffness is present with some types of arthritis.  DIAGNOSIS  Your caregiver will suspect arthritis based on your description of your symptoms and on your exam. Testing may be needed to find the type of arthritis:  Blood and sometimes urine tests.    X-ray tests and sometimes CT or MRI scans.   Removal of fluid from the joint (arthrocentesis) is done to check for bacteria, crystals or other causes. Your caregiver (or a specialist) will numb the area over the joint with a local anesthetic, and use a needle to remove joint fluid for examination. This procedure is only minimally uncomfortable.   Even with these tests, your caregiver may not be able to tell what kind of arthritis you have. Consultation with a specialist (rheumatologist) may be helpful.  TREATMENT  Your caregiver will discuss with you treatment specific to your type of arthritis. If the specific type cannot be determined, then the following general recommendations may apply. Treatment of severe joint pain includes:  Rest.   Elevation.   Anti-inflammatory medication (for example, ibuprofen) may be prescribed. Avoiding activities that cause increased pain.   Only take over-the-counter or prescription medicines for pain and discomfort as recommended by your caregiver.   Cold packs over an inflamed joint may be used for 10 to 15 minutes every hour. Hot packs sometimes feel better, but do not use overnight. Do not use hot packs if you are diabetic without your caregiver's permission.   A cortisone shot into arthritic joints may help reduce pain and swelling.   Any acute arthritis that gets worse over the next 1 to 2 days needs to be looked at to be sure there is no joint infection.  Long-term arthritis treatment involves modifying activities and lifestyle to reduce joint stress jarring. This can  include weight loss. Also, exercise is needed to nourish the joint cartilage and remove waste. This helps keep the muscles around the joint strong. HOME CARE INSTRUCTIONS   Do not take aspirin to relieve pain if gout is suspected. This elevates uric acid levels.   Only take over-the-counter or prescription medicines for pain, discomfort or fever as directed by your caregiver.    Rest the joint as much as possible.   If your joint is swollen, keep it elevated.   Use crutches if the painful joint is in your leg.   Drinking plenty of fluids may help for certain types of arthritis.   Follow your caregiver's dietary instructions.   Try low-impact exercise such as:   Swimming.   Water aerobics.   Biking.   Walking.   Morning stiffness is often relieved by a warm shower.   Put your joints through regular range-of-motion.  SEEK MEDICAL CARE IF:   You do not feel better in 24 hours or are getting worse.   You have side effects to medications, or are not getting better with treatment.  SEEK IMMEDIATE MEDICAL CARE IF:   You have a fever.   You develop severe joint pain, swelling or redness.   Many joints are involved and become painful and swollen.   There is severe back pain and/or leg weakness.   You have loss of bowel or bladder control.  Document Released: 04/22/2004 Document Revised: 03/04/2011 Document Reviewed: 05/08/2008 [facility] Patient Information 2012 [place], [place].    RESOURCE GUIDE  Dental Problems  Patients with Medicaid: [facility] [phone] [address]                                           [phone] [address] Phone:  [phone]                                                  Phone:  [phone]  If unable to pay or uninsured, contact:  [facility] or [facility]. to become qualified for the adult dental clinic.  Chronic Pain Problems Contact [facility]  [phone] Patients need to be referred by their primary care doctor.  Insufficient Money for Medicine Contact [employer]:  call "211" or [facility] (478)431-8064.  No Primary Care Doctor Call [facility]  9866189418 Other agencies that provide inexpensive medical care    [facility]  [phone]    [facility]  [phone]    [facility]  [phone]    [facility]  [phone]    [facility]  [phone]    [facility]  [phone]  Psychological Services [facility]  [phone] [facility]  [phone] [facility]   [phone] (emergency services ([phone]) [phone])  Substance Abuse Resources [facility]  (947) 775-1620 [facility] [phone] [facility] [place] [phone] Floydene Flock 715-789-3279 Residential & Outpatient Substance Abuse Program  251-691-2702  Abuse/Neglect [facility] Child Abuse Hotline (901)544-9765 [facility] Child Abuse Hotline 825-698-2566 (After Hours)  Emergency Shelter [place]  [employer] 701-655-7801  M[facility] [place] [facility] [place] ([phone] [phone] [facility] (520)[phone]  MRSA Hotline #:   225-325-6782    [facility] Resources  [facility]     [employer]                          [facility] Dept. [address] [place]                       [address]      [address]  Blondell Reveal Phone:  [phone]                                   Phone:  [phone]) [phone]                 Phone:  [phone]  [facility] Phone:  [phone]  [facility] Child Abuse Hotline 337-562-1721 (618)726-5762 (After Hours)      Narcotic and benzodiazepine use may cause drowsiness, slowed breathing or dependence.  Please use with caution and do not drive, operate machinery or watch young children alone while taking them.  Taking combinations of these medications or drinking alcohol will potentiate these effects.

## 2011-08-04 NOTE — ED Notes (Signed)
Pt reports several occurences of right foot pain, states has arthritis and has not taken any medications for arthritis in over a month, reports no relief with OTC meds. Pt reports he has had swelling to right foot and left foot, reports severe discomfort.

## 2012-01-26 DIAGNOSIS — M549 Dorsalgia, unspecified: Secondary | ICD-10-CM | POA: Insufficient documentation

## 2015-01-08 ENCOUNTER — Emergency Department (HOSPITAL_COMMUNITY)
Admission: EM | Admit: 2015-01-08 | Discharge: 2015-01-08 | Disposition: A | Discharge status: Home / Self Care | Payer: Medicaid Other | Attending: Emergency Medicine | Admitting: Emergency Medicine

## 2015-01-08 ENCOUNTER — Encounter (HOSPITAL_COMMUNITY): Payer: Self-pay | Admitting: Family Medicine

## 2015-01-08 DIAGNOSIS — F419 Anxiety disorder, unspecified: Secondary | ICD-10-CM | POA: Diagnosis not present

## 2015-01-08 DIAGNOSIS — M25572 Pain in left ankle and joints of left foot: Secondary | ICD-10-CM | POA: Diagnosis present

## 2015-01-08 DIAGNOSIS — M10072 Idiopathic gout, left ankle and foot: Secondary | ICD-10-CM | POA: Insufficient documentation

## 2015-01-08 DIAGNOSIS — F329 Major depressive disorder, single episode, unspecified: Secondary | ICD-10-CM | POA: Diagnosis not present

## 2015-01-08 DIAGNOSIS — E119 Type 2 diabetes mellitus without complications: Secondary | ICD-10-CM | POA: Insufficient documentation

## 2015-01-08 DIAGNOSIS — I10 Essential (primary) hypertension: Secondary | ICD-10-CM | POA: Diagnosis not present

## 2015-01-08 DIAGNOSIS — Z79899 Other long term (current) drug therapy: Secondary | ICD-10-CM | POA: Diagnosis not present

## 2015-01-08 DIAGNOSIS — Z720 Tobacco use: Secondary | ICD-10-CM | POA: Insufficient documentation

## 2015-01-08 DIAGNOSIS — Z8673 Personal history of transient ischemic attack (TIA), and cerebral infarction without residual deficits: Secondary | ICD-10-CM | POA: Insufficient documentation

## 2015-01-08 DIAGNOSIS — M109 Gout, unspecified: Secondary | ICD-10-CM

## 2015-01-08 DIAGNOSIS — F0390 Unspecified dementia without behavioral disturbance: Secondary | ICD-10-CM | POA: Insufficient documentation

## 2015-01-08 MED ORDER — INDOMETHACIN 50 MG PO CAPS: 50.0000 mg | ORAL_CAPSULE | Freq: Two times a day (BID) | ORAL | Status: DC

## 2015-01-08 MED ORDER — NEBIVOLOL HCL 10 MG PO TABS: 10.0000 mg | ORAL_TABLET | Freq: Every day | ORAL | Status: DC

## 2015-01-08 MED ORDER — PREDNISONE 20 MG PO TABS: 40.0000 mg | ORAL_TABLET | Freq: Every day | ORAL | Status: DC

## 2015-01-08 NOTE — ED Provider Notes (Signed)
CSN: [id number]     Arrival date & time 01/08/15  1430 History  By signing my name below, I, Evelene Croon, attest that this documentation has been prepared under the direction and in the presence of non-physician practitioner, Waynetta Pean, PA-C. Electronically Signed: Evelene Croon, Scribe. 01/08/2015. 3:41 PM.    Chief Complaint  Patient presents with  . Foot Pain   The history is provided by the patient. No language interpreter was used.     HPI Comments:  [name] is a 59 y.o. male with a history of gout who presents to the Emergency Department complaining of progressively worsening left foot pain which began ~4 days ago. Pt notes pain is similar to past episodes of gout. He has been out of his gout meds for ~ 3 months. He states he is currently out of all of his meds including his BP meds-bystolic. He denies fever, tingling/numbness. He also denies recent injury/fall. No alleviating factors noted. He has taken nothing for treatment today.  Pt has an appointment scheduled with new PCP on 02/04/2015.  Past Medical History  Diagnosis Date  . Gout   . Arthritis   . Diabetes mellitus   . Depression   . Dementia   . Anxiety   . TIA (transient ischemic attack)   . Hypertension    Past Surgical History  Procedure Laterality Date  . Hernia repair     History reviewed. No pertinent family history. Social History  Substance Use Topics  . Smoking status: Current Every Day Smoker -- 0.50 packs/day  . Smokeless tobacco: None  . Alcohol Use: No     Comment: former, last 5 yrs    Review of Systems  Constitutional: Negative for fever.  Musculoskeletal: Positive for myalgias, joint swelling and arthralgias.  Skin: Negative for rash and wound.  Neurological: Negative for weakness, numbness and headaches.    Allergies  Review of patient's allergies indicates no known allergies.  Home Medications   Prior to Admission medications   Medication Sig Start Date End Date  Taking? Authorizing Provider  amitriptyline (ELAVIL) 25 MG tablet Take 1 tablet (25 mg total) by mouth at bedtime. 08/04/11   Kingsley Spittle, MD  amLODipine (NORVASC) 5 MG tablet Take 1 tablet (5 mg total) by mouth daily. 08/04/11   Kingsley Spittle, MD  cloNIDine (CATAPRES) 0.1 MG tablet Take 1 tablet (0.1 mg total) by mouth 3 (three) times daily. 08/04/11   Kingsley Spittle, MD  colchicine 0.6 MG tablet Take 1 tablet (0.6 mg total) by mouth daily. 05/18/11 05/17/12  Delos Haring, PA-C  indomethacin (INDOCIN) 50 MG capsule Take 1 capsule (50 mg total) by mouth 2 (two) times daily with a meal. 01/08/15   Waynetta Pean, PA-C  lisinopril-hydrochlorothiazide (PRINZIDE,ZESTORETIC) 20-12.5 MG per tablet Take 1 tablet by mouth daily. 08/04/11   Kingsley Spittle, MD  LORazepam (ATIVAN) 1 MG tablet Take 1 tablet (1 mg total) by mouth every morning. 08/04/11   Kingsley Spittle, MD  nebivolol (BYSTOLIC) 10 MG tablet Take 1 tablet (10 mg total) by mouth daily. 01/08/15   Waynetta Pean, PA-C  nitroGLYCERIN (NITROSTAT) 0.4 MG SL tablet Place 0.4 mg under the tongue every 5 (five) minutes as needed. For chest pain    Historical Provider, MD  predniSONE (DELTASONE) 20 MG tablet Take 2 tablets (40 mg total) by mouth daily. 01/08/15   Waynetta Pean, PA-C  QUEtiapine (SEROQUEL XR) 400 MG 24 hr tablet Take 1 tablet (400 mg total) by mouth at bedtime. 08/04/11  Kingsley Spittle, MD  sertraline (ZOLOFT) 100 MG tablet Take 1 tablet (100 mg total) by mouth daily. 08/04/11   Kingsley Spittle, MD   BP 190/110 mmHg  Pulse 83  Temp(Src) 97.5 F (36.4 C) (Oral)  Resp 20  Ht 5\' 6"  (1.676 m)  Wt 190 lb (86.183 kg)  BMI 30.68 kg/m2  SpO2 97% Physical Exam  Constitutional: He is oriented to person, place, and time. He appears well-developed and well-nourished. No distress.  Nontoxic appearing.  HENT:  Head: Normocephalic and atraumatic.  Eyes: Right eye exhibits no discharge. Left eye exhibits no discharge.  Cardiovascular: Normal rate and intact distal  pulses.   Bilateral dorsalis pedis and posterior tibialis pulses are intact. Good capillary refill to his left distal toes.  Pulmonary/Chest: Effort normal. No respiratory distress.  Abdominal: Soft. There is no tenderness.  Musculoskeletal: He exhibits edema and tenderness.  bilateral dp/pt pulses intact Good cap refill of left distal toes; able to move left distal toes and ankle Edema, erythema, and warmth to left ankle; he has pain with active ROM, but is able to have ROM of his left ankle. No calf tenderness or edema.   Neurological: He is alert and oriented to person, place, and time. Coordination normal.  Sensation is intact his bilateral lower extremities.  Skin: Skin is warm and dry. No rash noted. He is not diaphoretic. No pallor.  Psychiatric: He has a normal mood and affect. His behavior is normal.  Nursing note and vitals reviewed.   ED Course  Procedures   DIAGNOSTIC STUDIES:  Oxygen Saturation is 97% on RA, normal by my interpretation.    COORDINATION OF CARE:  3:36 PM Discussed treatment plan with pt at bedside and pt agreed to plan.  Labs Review Labs Reviewed - No data to display  Imaging Review No results found.    EKG Interpretation None      Filed Vitals:   01/08/15 1441  BP: 190/110  Pulse: 83  Temp: 97.5 F (36.4 C)  TempSrc: Oral  Resp: 20  Height: 5\' 6"  (1.676 m)  Weight: 190 lb (86.183 kg)  SpO2: 97%     MDM   Meds given in ED:  Medications - No data to display  New Prescriptions   PREDNISONE (DELTASONE) 20 MG TABLET    Take 2 tablets (40 mg total) by mouth daily.    Final diagnoses:  Acute gout of left ankle, unspecified cause  Essential hypertension   This is a 59 year old male with a history of gout and hypertension who presents the emergency department complaining of left ankle pain for the past week. He reports this feels like his gout flares. He reports he's not had his gout medicine for 3 months. Patient also is  hypertensive on evaluation. He reports he is also out of his blood pressure medicine by Bystolic. On exam the patient is afebrile nontoxic appearing. Patient does have edema and warmth to his left ankle. He has range of motion of his left ankle, although with pain. Exam is not concerning for septic joint. Will restart the patient on indomethacin and give him a course of prednisone. I also provided the patient with a refill for his Bystollic and encouraged close follow-up by primary care. I advised the patient to follow-up with their primary care provider this week. I advised the patient to return to the emergency department with new or worsening symptoms or new concerns. The patient verbalized understanding and agreement with plan.    I, Evan Borneman  Damita [name], personally performed the services described in this documentation. All medical record entries made by the scribe were at my direction and in my presence.  I have reviewed the chart and discharge instructions and agree that the record reflects my personal performance and is accurate and complete. Donovan Persley Duncan.  01/08/2015. 3:50 PM.       Waynetta Pean, PA-C 01/08/15 1550  Lacretia Leigh, MD 01/08/15 2321

## 2015-01-08 NOTE — Discharge Instructions (Signed)
Gout °Gout is an inflammatory arthritis caused by a buildup of uric acid crystals in the joints. Uric acid is a chemical that is normally present in the blood. When the level of uric acid in the blood is too high it can form crystals that deposit in your joints and tissues. This causes joint redness, soreness, and swelling (inflammation). Repeat attacks are common. Over time, uric acid crystals can form into masses (tophi) near a joint, destroying bone and causing disfigurement. Gout is treatable and often preventable. °CAUSES  °The disease begins with elevated levels of uric acid in the blood. Uric acid is produced by your body when it breaks down a naturally found substance called purines. Certain foods you eat, such as meats and fish, contain high amounts of purines. Causes of an elevated uric acid level include: °· Being passed down from parent to child (heredity). °· Diseases that cause increased uric acid production (such as obesity, psoriasis, and certain cancers). °· Excessive alcohol use. °· Diet, especially diets rich in meat and seafood. °· Medicines, including certain cancer-fighting medicines (chemotherapy), water pills (diuretics), and aspirin. °· Chronic kidney disease. The kidneys are no longer able to remove uric acid well. °· Problems with metabolism. °Conditions strongly associated with gout include: °· Obesity. °· High blood pressure. °· High cholesterol. °· Diabetes. °Not everyone with elevated uric acid levels gets gout. It is not understood why some people get gout and others do not. Surgery, joint injury, and eating too much of certain foods are some of the factors that can lead to gout attacks. °SYMPTOMS  °· An attack of gout comes on quickly. It causes intense pain with redness, swelling, and warmth in a joint. °· Fever can occur. °· Often, only one joint is involved. Certain joints are more commonly involved: °· Base of the big toe. °· Knee. °· Ankle. °· Wrist. °· Finger. °Without  treatment, an attack usually goes away in a few days to weeks. Between attacks, you usually will not have symptoms, which is different from many other forms of arthritis. °DIAGNOSIS  °Your caregiver will suspect gout based on your symptoms and exam. In some cases, tests may be recommended. The tests may include: °· Blood tests. °· Urine tests. °· X-rays. °· Joint fluid exam. This exam requires a needle to remove fluid from the joint (arthrocentesis). Using a microscope, gout is confirmed when uric acid crystals are seen in the joint fluid. °TREATMENT  °There are two phases to gout treatment: treating the sudden onset (acute) attack and preventing attacks (prophylaxis). °· Treatment of an Acute Attack. °· Medicines are used. These include anti-inflammatory medicines or steroid medicines. °· An injection of steroid medicine into the affected joint is sometimes necessary. °· The painful joint is rested. Movement can worsen the arthritis. °· You may use warm or cold treatments on painful joints, depending which works best for you. °· Treatment to Prevent Attacks. °· If you suffer from frequent gout attacks, your caregiver may advise preventive medicine. These medicines are started after the acute attack subsides. These medicines either help your kidneys eliminate uric acid from your body or decrease your uric acid production. You may need to stay on these medicines for a very long time. °· The early phase of treatment with preventive medicine can be associated with an increase in acute gout attacks. For this reason, during the first few months of treatment, your caregiver may also advise you to take medicines usually used for acute gout treatment. Be sure you   understand your caregiver's directions. Your caregiver may make several adjustments to your medicine dose before these medicines are effective.  Discuss dietary treatment with your caregiver or dietitian. Alcohol and drinks high in sugar and fructose and foods  such as meat, poultry, and seafood can increase uric acid levels. Your caregiver or dietitian can advise you on drinks and foods that should be limited. HOME CARE INSTRUCTIONS   Do not take aspirin to relieve pain. This raises uric acid levels.  Only take over-the-counter or prescription medicines for pain, discomfort, or fever as directed by your caregiver.  Rest the joint as much as possible. When in bed, keep sheets and blankets off painful areas.  Keep the affected joint raised (elevated).  Apply warm or cold treatments to painful joints. Use of warm or cold treatments depends on which works best for you.  Use crutches if the painful joint is in your leg.  Drink enough fluids to keep your urine clear or pale yellow. This helps your body get rid of uric acid. Limit alcohol, sugary drinks, and fructose drinks.  Follow your dietary instructions. Pay careful attention to the amount of protein you eat. Your daily diet should emphasize fruits, vegetables, whole grains, and fat-free or low-fat milk products. Discuss the use of coffee, vitamin C, and cherries with your caregiver or dietitian. These may be helpful in lowering uric acid levels.  Maintain a healthy body weight. SEEK MEDICAL CARE IF:   You develop diarrhea, vomiting, or any side effects from medicines.  You do not feel better in 24 hours, or you are getting worse. SEEK IMMEDIATE MEDICAL CARE IF:   Your joint becomes suddenly more tender, and you have chills or a fever. MAKE SURE YOU:   Understand these instructions.  Will watch your condition.  Will get help right away if you are not doing well or get worse.   This information is not intended to replace advice given to you by your health care provider. Make sure you discuss any questions you have with your health care provider.   Document Released: 03/12/2000 Document Revised: 04/05/2014 Document Reviewed: 10/27/2011 Elsevier Interactive Patient Education 2016  [place] are compounds that affect the level of uric acid in your body. A low-purine diet is a diet that is low in purines. Eating a low-purine diet can prevent the level of uric acid in your body from getting too high and causing gout or kidney stones or both. WHAT DO I NEED TO KNOW ABOUT THIS DIET?  Choose low-purine foods. Examples of low-purine foods are listed in the next section.  Drink plenty of fluids, especially water. Fluids can help remove uric acid from your body. Try to drink 8-16 cups (1.9-3.8 L) a day.  Limit foods high in fat, especially saturated fat, as fat makes it harder for the body to get rid of uric acid. Foods high in saturated fat include pizza, cheese, ice cream, whole milk, fried foods, and gravies. Choose foods that are lower in fat and lean sources of protein. Use olive oil when cooking as it contains healthy fats that are not high in saturated fat.  Limit alcohol. Alcohol interferes with the elimination of uric acid from your body. If you are having a gout attack, avoid all alcohol.  Keep in mind that different people's bodies react differently to different foods. You will probably learn over time which foods do or do not affect you. If you discover that a food tends to cause  your gout to flare up, avoid eating that food. You can more freely enjoy foods that do not cause problems. If you have any questions about a food item, talk to your dietitian or health care provider. WHICH FOODS ARE LOW, MODERATE, AND HIGH IN PURINES? The following is a list of foods that are low, moderate, and high in purines. You can eat any amount of the foods that are low in purines. You may be able to have small amounts of foods that are moderate in purines. Ask your health care provider how much of a food moderate in purines you can have. Avoid foods high in purines. Grains  Foods low in purines: Enriched white bread, pasta, rice, cake, cornbread,  popcorn.  Foods moderate in purines: Whole-grain breads and cereals, wheat germ, bran, oatmeal. Uncooked oatmeal. Dry wheat bran or wheat germ.  Foods high in purines: Pancakes, [place] toast, biscuits, muffins. Vegetables  Foods low in purines: All vegetables, except those that are moderate in purines.  Foods moderate in purines: Asparagus, cauliflower, spinach, mushrooms, green peas. Fruits  All fruits are low in purines. Meats and other Protein Foods  Foods low in purines: Eggs, nuts, peanut butter.  Foods moderate in purines: 80-90% lean beef, lamb, veal, pork, poultry, fish, eggs, peanut butter, nuts. Crab, lobster, oysters, and shrimp. Cooked dried beans, peas, and lentils.  Foods high in purines: Anchovies, sardines, herring, mussels, tuna, codfish, scallops, trout, and haddock. Berniece Salines. Organ meats (such as liver or kidney). Tripe. Game meat. Goose. Sweetbreads. Dairy  All dairy foods are low in purines. Low-fat and fat-free dairy products are best because they are low in saturated fat. Beverages  Drinks low in purines: Water, carbonated beverages, tea, coffee, cocoa.  Drinks moderate in purines: Soft drinks and other drinks sweetened with high-fructose corn syrup. Juices. To find whether a food or drink is sweetened with high-fructose corn syrup, look at the ingredients list.  Drinks high in purines: Alcoholic beverages (such as beer). Condiments  Foods low in purines: Salt, herbs, olives, pickles, relishes, vinegar.  Foods moderate in purines: Butter, margarine, oils, mayonnaise. Fats and Oils  Foods low in purines: All types, except gravies and sauces made with meat.  Foods high in purines: Gravies and sauces made with meat. Other Foods  Foods low in purines: Sugars, sweets, gelatin. Cake. Soups made without meat.  Foods moderate in purines: Meat-based or fish-based soups, broths, or bouillons. Foods and drinks sweetened with high-fructose corn syrup.  Foods high  in purines: High-fat desserts (such as ice cream, cookies, cakes, pies, doughnuts, and chocolate). Contact your dietitian for more information on foods that are not listed here.   This information is not intended to replace advice given to you by your health care provider. Make sure you discuss any questions you have with your health care provider.   Document Released: 07/10/2010 Document Revised: 03/20/2013 Document Reviewed: 02/19/2013 Elsevier Interactive Patient Education 2016 [employer]. Hypertension Hypertension, commonly called high blood pressure, is when the force of blood pumping through your arteries is too strong. Your arteries are the blood vessels that carry blood from your heart throughout your body. A blood pressure reading consists of a higher number over a lower number, such as 110/72. The higher number (systolic) is the pressure inside your arteries when your heart pumps. The lower number (diastolic) is the pressure inside your arteries when your heart relaxes. Ideally you want your blood pressure below 120/80. Hypertension forces your heart to work harder to pump blood.  Your arteries may become narrow or stiff. Having untreated or uncontrolled hypertension can cause heart attack, stroke, kidney disease, and other problems. RISK FACTORS Some risk factors for high blood pressure are controllable. Others are not.  Risk factors you cannot control include:   Race. You may be at higher risk if you are African American.  Age. Risk increases with age.  Gender. Men are at higher risk than women before age 49 years. After age 32, women are at higher risk than men. Risk factors you can control include:  Not getting enough exercise or physical activity.  Being overweight.  Getting too much fat, sugar, calories, or salt in your diet.  Drinking too much alcohol. SIGNS AND SYMPTOMS Hypertension does not usually cause signs or symptoms. Extremely high blood pressure (hypertensive  crisis) may cause headache, anxiety, shortness of breath, and nosebleed. DIAGNOSIS To check if you have hypertension, your health care provider will measure your blood pressure while you are seated, with your arm held at the level of your heart. It should be measured at least twice using the same arm. Certain conditions can cause a difference in blood pressure between your right and left arms. A blood pressure reading that is higher than normal on one occasion does not mean that you need treatment. If it is not clear whether you have high blood pressure, you may be asked to return on a different day to have your blood pressure checked again. Or, you may be asked to monitor your blood pressure at home for 1 or more weeks. TREATMENT Treating high blood pressure includes making lifestyle changes and possibly taking medicine. Living a healthy lifestyle can help lower high blood pressure. You may need to change some of your habits. Lifestyle changes may include:  Following the DASH diet. This diet is high in fruits, vegetables, and whole grains. It is low in salt, red meat, and added sugars.  Keep your sodium intake below 2,300 mg per day.  Getting at least 30-45 minutes of aerobic exercise at least 4 times per week.  Losing weight if necessary.  Not smoking.  Limiting alcoholic beverages.  Learning ways to reduce stress. Your health care provider may prescribe medicine if lifestyle changes are not enough to get your blood pressure under control, and if one of the following is true:  You are 60-68 years of age and your systolic blood pressure is above 140.  You are 88 years of age or older, and your systolic blood pressure is above 150.  Your diastolic blood pressure is above 90.  You have diabetes, and your systolic blood pressure is over 408 or your diastolic blood pressure is over 90.  You have kidney disease and your blood pressure is above 140/90.  You have heart disease and your  blood pressure is above 140/90. Your personal target blood pressure may vary depending on your medical conditions, your age, and other factors. HOME CARE INSTRUCTIONS  Have your blood pressure rechecked as directed by your health care provider.   Take medicines only as directed by your health care provider. Follow the directions carefully. Blood pressure medicines must be taken as prescribed. The medicine does not work as well when you skip doses. Skipping doses also puts you at risk for problems.  Do not smoke.   Monitor your blood pressure at home as directed by your health care provider. SEEK MEDICAL CARE IF:   You think you are having a reaction to medicines taken.  You have recurrent  headaches or feel dizzy.  You have swelling in your ankles.  You have trouble with your vision. SEEK IMMEDIATE MEDICAL CARE IF:  You develop a severe headache or confusion.  You have unusual weakness, numbness, or feel faint.  You have severe chest or abdominal pain.  You vomit repeatedly.  You have trouble breathing. MAKE SURE YOU:   Understand these instructions.  Will watch your condition.  Will get help right away if you are not doing well or get worse.   This information is not intended to replace advice given to you by your health care provider. Make sure you discuss any questions you have with your health care provider.   Document Released: 03/15/2005 Document Revised: 07/30/2014 Document Reviewed: 01/05/2013 Elsevier Interactive Patient Education 2016 [place] DASH stands for "Dietary Approaches to Stop Hypertension." The DASH eating plan is a healthy eating plan that has been shown to reduce high blood pressure (hypertension). Additional health benefits may include reducing the risk of type 2 diabetes mellitus, heart disease, and stroke. The DASH eating plan may also help with weight loss. WHAT DO I NEED TO KNOW ABOUT THE DASH EATING PLAN? For the  DASH eating plan, you will follow these general guidelines:  Choose foods with a percent daily value for sodium of less than 5% (as listed on the food label).  Use salt-free seasonings or herbs instead of table salt or sea salt.  Check with your health care provider or pharmacist before using salt substitutes.  Eat lower-sodium products, often labeled as "lower sodium" or "no salt added."  Eat fresh foods.  Eat more vegetables, fruits, and low-fat dairy products.  Choose whole grains. Look for the word "whole" as the first word in the ingredient list.  Choose fish and skinless chicken or [place] more often than red meat. Limit fish, poultry, and meat to 6 oz (170 g) each day.  Limit sweets, desserts, sugars, and sugary drinks.  Choose heart-healthy fats.  Limit cheese to 1 oz (28 g) per day.  Eat more home-cooked food and less restaurant, buffet, and fast food.  Limit fried foods.  Cook foods using methods other than frying.  Limit canned vegetables. If you do use them, rinse them well to decrease the sodium.  When eating at a restaurant, ask that your food be prepared with less salt, or no salt if possible. WHAT FOODS CAN I EAT? Seek help from a dietitian for individual calorie needs. Grains Whole grain or whole wheat bread. Brown rice. Whole grain or whole wheat pasta. Quinoa, bulgur, and whole grain cereals. Low-sodium cereals. Corn or whole wheat flour tortillas. Whole grain cornbread. Whole grain crackers. Low-sodium crackers. Vegetables Fresh or frozen vegetables (raw, steamed, roasted, or grilled). Low-sodium or reduced-sodium tomato and vegetable juices. Low-sodium or reduced-sodium tomato sauce and paste. Low-sodium or reduced-sodium canned vegetables.  Fruits All fresh, canned (in natural juice), or frozen fruits. Meat and Other Protein Products Ground beef (85% or leaner), grass-fed beef, or beef trimmed of fat. Skinless chicken or [place]. Ground chicken or [place].  Pork trimmed of fat. All fish and seafood. Eggs. Dried beans, peas, or lentils. Unsalted nuts and seeds. Unsalted canned beans. Dairy Low-fat dairy products, such as skim or 1% milk, 2% or reduced-fat cheeses, low-fat ricotta or cottage cheese, or plain low-fat yogurt. Low-sodium or reduced-sodium cheeses. Fats and Oils Tub margarines without trans fats. Light or reduced-fat mayonnaise and salad dressings (reduced sodium). Avocado. Safflower, olive, or canola oils. Natural  peanut or almond butter. Other Unsalted popcorn and pretzels. The items listed above may not be a complete list of recommended foods or beverages. Contact your dietitian for more options. WHAT FOODS ARE NOT RECOMMENDED? Grains White bread. White pasta. White rice. Refined cornbread. Bagels and croissants. Crackers that contain trans fat. Vegetables Creamed or fried vegetables. Vegetables in a cheese sauce. Regular canned vegetables. Regular canned tomato sauce and paste. Regular tomato and vegetable juices. Fruits Dried fruits. Canned fruit in light or heavy syrup. Fruit juice. Meat and Other Protein Products Fatty cuts of meat. Ribs, chicken wings, bacon, sausage, bologna, salami, chitterlings, fatback, hot dogs, bratwurst, and packaged luncheon meats. Salted nuts and seeds. Canned beans with salt. Dairy Whole or 2% milk, cream, half-and-half, and cream cheese. Whole-fat or sweetened yogurt. Full-fat cheeses or blue cheese. Nondairy creamers and whipped toppings. Processed cheese, cheese spreads, or cheese curds. Condiments Onion and garlic salt, seasoned salt, table salt, and sea salt. Canned and packaged gravies. Worcestershire sauce. Tartar sauce. Barbecue sauce. Teriyaki sauce. Soy sauce, including reduced sodium. Steak sauce. Fish sauce. Oyster sauce. Cocktail sauce. Horseradish. Ketchup and mustard. Meat flavorings and tenderizers. Bouillon cubes. Hot sauce. Tabasco sauce. Marinades. Taco seasonings. Relishes. Fats and  Oils Butter, stick margarine, lard, shortening, ghee, and bacon fat. Coconut, palm kernel, or palm oils. Regular salad dressings. Other Pickles and olives. Salted popcorn and pretzels. The items listed above may not be a complete list of foods and beverages to avoid. Contact your dietitian for more information. WHERE CAN I FIND MORE INFORMATION? National Heart, Lung, and Blood Institute: travelstabloid.com   This information is not intended to replace advice given to you by your health care provider. Make sure you discuss any questions you have with your health care provider.   Document Released: 03/04/2011 Document Revised: 04/05/2014 Document Reviewed: 01/17/2013 Elsevier Interactive Patient Education [employer].

## 2015-01-08 NOTE — ED Notes (Signed)
Pt here for left foot pain x 1 week. sts hx of gout and hasn't had medicines. sts he has been out of all of his medications. Slight swelling in foot. Denies injury.

## 2015-01-17 ENCOUNTER — Telehealth: Payer: Self-pay | Admitting: Surgery

## 2015-01-17 NOTE — ED Provider Notes (Signed)
Pt was seen on 01/08/15 for a gouty flare and was seen by Sula Rumple, PA.  He was also found to be hypertensive with 381 systolic.  He did not have any BP medication at that time, was a prescription of nebivolol 10mg  PO daily was filled.  Pt called today requesting for a different BP medication because Medicare does not cover this medication.  He has been on lisinopril/hydrochlorothiazide in the past but given his current gouty flair i do not think the medication is appropriate.  Pt will be prescribed atenolol 25mg  PO daily, a total of 30 tablets and he is scheduled to be seen and evaluated by his PCP on 02/03/2015 for further management of his condition.    Domenic Moras, PA-C 01/17/15 [place], MD 01/17/15 [phone]

## 2015-01-17 NOTE — Telephone Encounter (Signed)
ED CM received call from [place] concerning patient's insurance not covering Bystolic, requesting a  Change in blood pressure med covered by Medicaid. Discussed with B. Haywood Pao. Changed to Atenolol 25mg  Tab po daily. RA TO order given to [facility] Pharmacist. Asked Phamacist to remind patient of his follow up appointment on Nov 7th. No further ED CM needs identified.

## 2015-03-30 DIAGNOSIS — I219 Acute myocardial infarction, unspecified: Secondary | ICD-10-CM

## 2015-03-30 HISTORY — DX: Acute myocardial infarction, unspecified: I21.9

## 2015-10-13 ENCOUNTER — Encounter (HOSPITAL_COMMUNITY): Payer: Self-pay | Admitting: Family Medicine

## 2015-10-13 ENCOUNTER — Ambulatory Visit (HOSPITAL_COMMUNITY)
Admission: EM | Admit: 2015-10-13 | Discharge: 2015-10-13 | Disposition: A | Discharge status: Home / Self Care | Payer: Medicaid Other | Attending: Family Medicine | Admitting: Family Medicine

## 2015-10-13 DIAGNOSIS — M109 Gout, unspecified: Secondary | ICD-10-CM

## 2015-10-13 DIAGNOSIS — M10071 Idiopathic gout, right ankle and foot: Secondary | ICD-10-CM | POA: Diagnosis not present

## 2015-10-13 MED ORDER — HYDROCODONE-ACETAMINOPHEN 5-325 MG PO TABS: 1.0000 | ORAL_TABLET | ORAL | Status: DC | PRN

## 2015-10-13 NOTE — ED Notes (Signed)
Pt here for gout flare up in right foot since Saturday. Pt taking regular meds for gout without relief. sts he cant see his doctor until next week and needs something for pain.

## 2015-10-13 NOTE — ED Provider Notes (Signed)
CSN: [id number]     Arrival date & time 10/13/15  1201 History   First MD Initiated Contact with Patient 10/13/15 1325     Chief Complaint  Patient presents with  . Gout   (Consider location/radiation/quality/duration/timing/severity/associated sxs/prior Treatment) HPI History obtained from patient:  [name] presents with the cc of:  Gouty flare Duration of symptoms: 4 days Treatment prior to arrival: Colchicine and allopurinol Context: Onset of gout 4 days ago. Has been using colchicine and allopurinol getting somewhat better but still has pain. Was unable to see his primary care provider today Dr. Noah Delaine.  Other symptoms include: Pain with ambulation Pain score: 4 FAMILY HISTORY: Hypertension    Past Medical History  Diagnosis Date  . Gout   . Arthritis   . Diabetes mellitus   . Depression   . Dementia   . Anxiety   . TIA (transient ischemic attack)   . Hypertension    Past Surgical History  Procedure Laterality Date  . Hernia repair     History reviewed. No pertinent family history. Social History  Substance Use Topics  . Smoking status: Current Every Day Smoker -- 0.50 packs/day  . Smokeless tobacco: None  . Alcohol Use: No     Comment: former, last 5 yrs    Review of Systems  Denies: HEADACHE, NAUSEA, ABDOMINAL PAIN, CHEST PAIN, CONGESTION, DYSURIA, SHORTNESS OF BREATH  Allergies  Review of patient's allergies indicates no known allergies.  Home Medications   Prior to Admission medications   Medication Sig Start Date End Date Taking? Authorizing Provider  amitriptyline (ELAVIL) 25 MG tablet Take 1 tablet (25 mg total) by mouth at bedtime. 08/04/11   Kingsley Spittle, MD  amLODipine (NORVASC) 5 MG tablet Take 1 tablet (5 mg total) by mouth daily. 08/04/11   Kingsley Spittle, MD  cloNIDine (CATAPRES) 0.1 MG tablet Take 1 tablet (0.1 mg total) by mouth 3 (three) times daily. 08/04/11   Kingsley Spittle, MD  colchicine 0.6 MG tablet Take 1 tablet (0.6 mg total) by mouth daily.  05/18/11 05/17/12  Delos Haring, PA-C  HYDROcodone-acetaminophen (NORCO/VICODIN) 5-325 MG tablet Take 1 tablet by mouth every 4 (four) hours as needed. 10/13/15   Konrad Felix, PA  indomethacin (INDOCIN) 50 MG capsule Take 1 capsule (50 mg total) by mouth 2 (two) times daily with a meal. 01/08/15   Waynetta Pean, PA-C  lisinopril-hydrochlorothiazide (PRINZIDE,ZESTORETIC) 20-12.5 MG per tablet Take 1 tablet by mouth daily. 08/04/11   Kingsley Spittle, MD  LORazepam (ATIVAN) 1 MG tablet Take 1 tablet (1 mg total) by mouth every morning. 08/04/11   Kingsley Spittle, MD  nebivolol (BYSTOLIC) 10 MG tablet Take 1 tablet (10 mg total) by mouth daily. 01/08/15   Waynetta Pean, PA-C  nitroGLYCERIN (NITROSTAT) 0.4 MG SL tablet Place 0.4 mg under the tongue every 5 (five) minutes as needed. For chest pain    Historical Provider, MD  predniSONE (DELTASONE) 20 MG tablet Take 2 tablets (40 mg total) by mouth daily. 01/08/15   Waynetta Pean, PA-C  QUEtiapine (SEROQUEL XR) 400 MG 24 hr tablet Take 1 tablet (400 mg total) by mouth at bedtime. 08/04/11   Kingsley Spittle, MD  sertraline (ZOLOFT) 100 MG tablet Take 1 tablet (100 mg total) by mouth daily. 08/04/11   Kingsley Spittle, MD   Meds Ordered and Administered this Visit  Medications - No data to display  BP 174/85 mmHg  Pulse 74  Temp(Src) 98.5 F (36.9 C) (Oral)  Resp 16  SpO2 100% No  data found.   Physical Exam NURSES NOTES AND VITAL SIGNS REVIEWED. CONSTITUTIONAL: Well developed, well nourished, no acute distress HEENT: normocephalic, atraumatic EYES: Conjunctiva normal NECK:normal ROM, supple, no adenopathy PULMONARY:No respiratory distress, normal effort ABDOMINAL: Soft, ND, NT BS+, No CVAT MUSCULOSKELETAL: Normal ROM of all extremities, Examination of the right ankle medial aspect reveals tenderness to palpation redness and warmth to touch. SKIN: warm and dry without rash PSYCHIATRIC: Mood and affect, behavior are normal  ED Course  Procedures (including  critical care time)  Labs Review Labs Reviewed - No data to display  Imaging Review No results found.   Visual Acuity Review  Right Eye Distance:   Left Eye Distance:   Bilateral Distance:    Right Eye Near:   Left Eye Near:    Bilateral Near:       [place] prescription monitoring was evaluated with patient and he has not had any pain medication prescribed for over one year.  MDM   1. Acute gout of right ankle, unspecified cause     Patient is reassured that there are no issues that require transfer to higher level of care at this time or additional tests. Patient is advised to continue home symptomatic treatment. Patient is advised that if there are new or worsening symptoms to attend the emergency department, contact primary care provider, or return to UC. Instructions of care provided discharged home in stable condition.    THIS NOTE WAS GENERATED USING A VOICE RECOGNITION SOFTWARE PROGRAM. ALL REASONABLE EFFORTS  WERE MADE TO PROOFREAD THIS DOCUMENT FOR ACCURACY.  I have verbally reviewed the discharge instructions with the patient. A printed AVS was given to the patient.  All questions were answered prior to discharge.      Konrad Felix, [place] 10/13/15 1820

## 2015-10-13 NOTE — Discharge Instructions (Signed)
Gout °Gout is when your joints become red, sore, and swell (inflamed). This is caused by the buildup of uric acid crystals in the joints. Uric acid is a chemical that is normally in the blood. If the level of uric acid gets too high in the blood, these crystals form in your joints and tissues. Over time, these crystals can form into masses near the joints and tissues. These masses can destroy bone and cause the bone to look misshapen (deformed). °HOME CARE  °· Do not take aspirin for pain. °· Only take medicine as told by your doctor. °· Rest the joint as much as you can. When in bed, keep sheets and blankets off painful areas. °· Keep the sore joints raised (elevated). °· Put warm or cold packs on painful joints. Use of warm or cold packs depends on which works best for you. °· Use crutches if the painful joint is in your leg. °· Drink enough fluids to keep your pee (urine) clear or pale yellow. Limit alcohol, sugary drinks, and drinks with fructose in them. °· Follow your diet instructions. Pay careful attention to how much protein you eat. Include fruits, vegetables, whole grains, and fat-free or low-fat milk products in your daily diet. Talk to your doctor or dietitian about the use of coffee, vitamin C, and cherries. These may help lower uric acid levels. °· Keep a healthy body weight. °GET HELP RIGHT AWAY IF:  °· You have watery poop (diarrhea), throw up (vomit), or have any side effects from medicines. °· You do not feel better in 24 hours, or you are getting worse. °· Your joint becomes suddenly more tender, and you have chills or a fever. °MAKE SURE YOU:  °· Understand these instructions. °· Will watch your condition. °· Will get help right away if you are not doing well or get worse. °  °This information is not intended to replace advice given to you by your health care provider. Make sure you discuss any questions you have with your health care provider. °  °Document Released: 12/23/2007 Document Revised:  04/05/2014 Document Reviewed: 10/27/2011 °Elsevier Interactive Patient Education ©2016 [employer] ° °

## 2015-12-18 ENCOUNTER — Encounter (HOSPITAL_COMMUNITY)
Admission: EM | Disposition: A | Discharge status: Home / Self Care | Payer: Self-pay | Source: Home / Self Care | Attending: Cardiology

## 2015-12-18 ENCOUNTER — Encounter (HOSPITAL_COMMUNITY): Payer: Self-pay

## 2015-12-18 ENCOUNTER — Emergency Department (HOSPITAL_COMMUNITY): Payer: Medicaid Other

## 2015-12-18 ENCOUNTER — Inpatient Hospital Stay (HOSPITAL_COMMUNITY)
Admission: EM | Admit: 2015-12-18 | Discharge: 2015-12-19 | DRG: 246 | Disposition: A | Discharge status: Home / Self Care | Payer: Medicaid Other | Attending: Cardiology | Admitting: Cardiology

## 2015-12-18 DIAGNOSIS — E876 Hypokalemia: Secondary | ICD-10-CM | POA: Diagnosis not present

## 2015-12-18 DIAGNOSIS — I2510 Atherosclerotic heart disease of native coronary artery without angina pectoris: Secondary | ICD-10-CM | POA: Diagnosis present

## 2015-12-18 DIAGNOSIS — Z955 Presence of coronary angioplasty implant and graft: Secondary | ICD-10-CM

## 2015-12-18 DIAGNOSIS — I150 Renovascular hypertension: Secondary | ICD-10-CM | POA: Diagnosis present

## 2015-12-18 DIAGNOSIS — I2542 Coronary artery dissection: Secondary | ICD-10-CM | POA: Diagnosis present

## 2015-12-18 DIAGNOSIS — Z79899 Other long term (current) drug therapy: Secondary | ICD-10-CM

## 2015-12-18 DIAGNOSIS — I214 Non-ST elevation (NSTEMI) myocardial infarction: Principal | ICD-10-CM | POA: Diagnosis present

## 2015-12-18 DIAGNOSIS — I252 Old myocardial infarction: Secondary | ICD-10-CM

## 2015-12-18 DIAGNOSIS — Z8249 Family history of ischemic heart disease and other diseases of the circulatory system: Secondary | ICD-10-CM

## 2015-12-18 DIAGNOSIS — F17210 Nicotine dependence, cigarettes, uncomplicated: Secondary | ICD-10-CM | POA: Diagnosis present

## 2015-12-18 HISTORY — DX: Old myocardial infarction: I25.2

## 2015-12-18 HISTORY — PX: PERIPHERAL VASCULAR CATHETERIZATION: SHX172C

## 2015-12-18 HISTORY — PX: CARDIAC CATHETERIZATION: SHX172

## 2015-12-18 HISTORY — DX: Presence of coronary angioplasty implant and graft: Z95.5

## 2015-12-18 LAB — BASIC METABOLIC PANEL
Anion gap: 9 (ref 5–15)
BUN: 13 mg/dL (ref 6–20)
CO2: 20 mmol/L — ABNORMAL LOW (ref 22–32)
Calcium: 8.8 mg/dL — ABNORMAL LOW (ref 8.9–10.3)
Chloride: 109 mmol/L (ref 101–111)
Creatinine, Ser: 1.21 mg/dL (ref 0.61–1.24)
GFR calc Af Amer: >60 mL/min (ref >60)
GFR calc non Af Amer: >60 mL/min (ref >60)
Glucose, Bld: 108 mg/dL — ABNORMAL HIGH (ref 65–99)
Potassium: 3.6 mmol/L (ref 3.5–5.1)
Sodium: 138 mmol/L (ref 135–145)

## 2015-12-18 LAB — I-STAT TROPONIN, ED: Troponin i, poc: 0.2 ng/mL (ref 0.00–0.08)

## 2015-12-18 LAB — POCT ACTIVATED CLOTTING TIME
Activated Clotting Time: 296 seconds
Activated Clotting Time: 301 seconds

## 2015-12-18 LAB — CBC
HCT: 41.1 % (ref 39.0–52.0)
Hemoglobin: 14.3 g/dL (ref 13.0–17.0)
MCH: 31.8 pg (ref 26.0–34.0)
MCHC: 34.8 g/dL (ref 30.0–36.0)
MCV: 91.5 fL (ref 78.0–100.0)
Platelets: 115 10*3/uL — ABNORMAL LOW (ref 150–400)
RBC: 4.49 MIL/uL (ref 4.22–5.81)
RDW: 14 % (ref 11.5–15.5)
WBC: 7.4 10*3/uL (ref 4.0–10.5)

## 2015-12-18 LAB — LIPID PANEL
Cholesterol: 88 mg/dL (ref 0–200)
HDL: 17 mg/dL — ABNORMAL LOW (ref >40)
LDL Cholesterol: 46 mg/dL (ref 0–99)
Total CHOL/HDL Ratio: 5.2 RATIO
Triglycerides: 127 mg/dL (ref <150)
VLDL: 25 mg/dL (ref 0–40)

## 2015-12-18 LAB — HEPARIN LEVEL (UNFRACTIONATED): Heparin Unfractionated: 1.4 IU/mL — ABNORMAL HIGH (ref 0.30–0.70)

## 2015-12-18 LAB — TROPONIN I: Troponin I: 0.27 ng/mL (ref <0.03)

## 2015-12-18 LAB — GLUCOSE, CAPILLARY: Glucose-Capillary: 176 mg/dL — ABNORMAL HIGH (ref 65–99)

## 2015-12-18 SURGERY — LEFT HEART CATH AND CORONARY ANGIOGRAPHY

## 2015-12-18 MED ORDER — HYDROMORPHONE HCL 1 MG/ML IJ SOLN
INTRAMUSCULAR | Status: DC | PRN
  Administered 2015-12-18: 0.5 mg via INTRAVENOUS

## 2015-12-18 MED ORDER — ACETAMINOPHEN 325 MG PO TABS: 650.0000 mg | ORAL_TABLET | ORAL | Status: DC | PRN

## 2015-12-18 MED ORDER — MIDAZOLAM HCL 2 MG/2ML IJ SOLN
INTRAMUSCULAR | Status: AC
  Filled 2015-12-18: qty 2

## 2015-12-18 MED ORDER — SODIUM CHLORIDE 0.9 % WEIGHT BASED INFUSION: 3.0000 mL/kg/h | INTRAVENOUS | Status: DC

## 2015-12-18 MED ORDER — HEPARIN (PORCINE) IN NACL 2-0.9 UNIT/ML-% IJ SOLN
INTRAMUSCULAR | Status: DC | PRN
  Administered 2015-12-18: 1000 mL

## 2015-12-18 MED ORDER — SODIUM CHLORIDE 0.9 % IV SOLN: 250.0000 mL | INTRAVENOUS | Status: DC | PRN

## 2015-12-18 MED ORDER — HYDRALAZINE HCL 20 MG/ML IJ SOLN
INTRAMUSCULAR | Status: AC
  Administered 2015-12-18: 20:00:00 10 mg
  Filled 2015-12-18: qty 1

## 2015-12-18 MED ORDER — VERAPAMIL HCL 2.5 MG/ML IV SOLN
INTRAVENOUS | Status: AC
Start: 2015-12-18 — End: 2015-12-18
  Filled 2015-12-18: qty 2

## 2015-12-18 MED ORDER — IOPAMIDOL (ISOVUE-370) INJECTION 76%
INTRAVENOUS | Status: AC
  Filled 2015-12-18: qty 100

## 2015-12-18 MED ORDER — VERAPAMIL HCL 2.5 MG/ML IV SOLN
INTRA_ARTERIAL | Status: DC | PRN
  Administered 2015-12-18: 15 mL via INTRA_ARTERIAL

## 2015-12-18 MED ORDER — SODIUM CHLORIDE 0.9% FLUSH: 3.0000 mL | INTRAVENOUS | Status: DC | PRN

## 2015-12-18 MED ORDER — COLCHICINE 0.6 MG PO TABS
0.6000 mg | ORAL_TABLET | Freq: Every day | ORAL | Status: DC
  Administered 2015-12-19: 08:00:00 0.6 mg via ORAL
  Filled 2015-12-18: qty 1

## 2015-12-18 MED ORDER — TICAGRELOR 90 MG PO TABS
ORAL_TABLET | ORAL | Status: AC
  Filled 2015-12-18: qty 2

## 2015-12-18 MED ORDER — HEPARIN BOLUS VIA INFUSION
4000.0000 [IU] | Freq: Once | INTRAVENOUS | Status: AC
  Administered 2015-12-18: 4000 [IU] via INTRAVENOUS
  Filled 2015-12-18: qty 4000

## 2015-12-18 MED ORDER — HYDRALAZINE HCL 20 MG/ML IJ SOLN
INTRAMUSCULAR | Status: AC
  Filled 2015-12-18: qty 1

## 2015-12-18 MED ORDER — INFLUENZA VAC SPLIT QUAD 0.5 ML IM SUSY
0.5000 mL | PREFILLED_SYRINGE | INTRAMUSCULAR | Status: AC
  Administered 2015-12-19: 0.5 mL via INTRAMUSCULAR
  Filled 2015-12-18: qty 0.5

## 2015-12-18 MED ORDER — ONDANSETRON HCL 4 MG/2ML IJ SOLN
4.0000 mg | Freq: Four times a day (QID) | INTRAMUSCULAR | Status: DC | PRN
  Administered 2015-12-18: 20:00:00 4 mg via INTRAVENOUS

## 2015-12-18 MED ORDER — LIDOCAINE HCL (PF) 1 % IJ SOLN
INTRAMUSCULAR | Status: AC
  Filled 2015-12-18: qty 30

## 2015-12-18 MED ORDER — LABETALOL HCL 5 MG/ML IV SOLN
INTRAVENOUS | Status: AC
  Filled 2015-12-18: qty 4

## 2015-12-18 MED ORDER — NITROGLYCERIN 1 MG/10 ML FOR IR/CATH LAB
INTRA_ARTERIAL | Status: AC
  Filled 2015-12-18: qty 10

## 2015-12-18 MED ORDER — ATORVASTATIN CALCIUM 80 MG PO TABS
80.0000 mg | ORAL_TABLET | Freq: Every day | ORAL | Status: DC
  Administered 2015-12-18: 80 mg via ORAL
  Filled 2015-12-18: qty 1

## 2015-12-18 MED ORDER — TICAGRELOR 90 MG PO TABS
90.0000 mg | ORAL_TABLET | Freq: Two times a day (BID) | ORAL | Status: DC
  Administered 2015-12-19: 08:00:00 90 mg via ORAL
  Filled 2015-12-18: qty 1

## 2015-12-18 MED ORDER — SODIUM CHLORIDE 0.9% FLUSH: 3.0000 mL | Freq: Two times a day (BID) | INTRAVENOUS | Status: DC

## 2015-12-18 MED ORDER — NITROGLYCERIN 0.4 MG SL SUBL: 0.4000 mg | SUBLINGUAL_TABLET | SUBLINGUAL | Status: DC | PRN

## 2015-12-18 MED ORDER — HEPARIN (PORCINE) IN NACL 100-0.45 UNIT/ML-% IJ SOLN
1000.0000 [IU]/h | INTRAMUSCULAR | Status: DC
  Administered 2015-12-18: 1000 [IU]/h via INTRAVENOUS
  Filled 2015-12-18: qty 250

## 2015-12-18 MED ORDER — HEPARIN (PORCINE) IN NACL 2-0.9 UNIT/ML-% IJ SOLN
INTRAMUSCULAR | Status: AC
  Filled 2015-12-18: qty 1000

## 2015-12-18 MED ORDER — SODIUM CHLORIDE 0.9 % IV SOLN
INTRAVENOUS | Status: AC
  Administered 2015-12-18: 15:00:00 via INTRAVENOUS

## 2015-12-18 MED ORDER — HYDRALAZINE HCL 20 MG/ML IJ SOLN
INTRAMUSCULAR | Status: DC | PRN
  Administered 2015-12-18: 10 mg via INTRAVENOUS

## 2015-12-18 MED ORDER — LIDOCAINE HCL (PF) 1 % IJ SOLN
INTRAMUSCULAR | Status: DC | PRN
  Administered 2015-12-18: 3 mL via INTRADERMAL

## 2015-12-18 MED ORDER — CLONIDINE HCL 0.1 MG PO TABS
0.2000 mg | ORAL_TABLET | Freq: Two times a day (BID) | ORAL | Status: DC
  Administered 2015-12-18 – 2015-12-19 (×2): 0.2 mg via ORAL
  Filled 2015-12-18 (×2): qty 2

## 2015-12-18 MED ORDER — ANGIOPLASTY BOOK
Freq: Once | Status: AC
  Administered 2015-12-18: 22:00:00
  Filled 2015-12-18: qty 1

## 2015-12-18 MED ORDER — NITROGLYCERIN 1 MG/10 ML FOR IR/CATH LAB
INTRA_ARTERIAL | Status: DC | PRN
  Administered 2015-12-18: 200 ug via INTRACORONARY

## 2015-12-18 MED ORDER — MIDAZOLAM HCL 2 MG/2ML IJ SOLN
INTRAMUSCULAR | Status: DC | PRN
  Administered 2015-12-18: 2 mg via INTRAVENOUS

## 2015-12-18 MED ORDER — METOPROLOL TARTRATE 25 MG PO TABS
100.0000 mg | ORAL_TABLET | Freq: Two times a day (BID) | ORAL | Status: DC
  Administered 2015-12-18 – 2015-12-19 (×2): 100 mg via ORAL
  Filled 2015-12-18: qty 8
  Filled 2015-12-18: qty 4

## 2015-12-18 MED ORDER — PNEUMOCOCCAL VAC POLYVALENT 25 MCG/0.5ML IJ INJ
0.5000 mL | INJECTION | INTRAMUSCULAR | Status: AC
  Administered 2015-12-19: 11:00:00 0.5 mL via INTRAMUSCULAR
  Filled 2015-12-18 (×2): qty 0.5

## 2015-12-18 MED ORDER — TICAGRELOR 90 MG PO TABS
ORAL_TABLET | ORAL | Status: DC | PRN
  Administered 2015-12-18: 180 mg via ORAL

## 2015-12-18 MED ORDER — SODIUM CHLORIDE 0.9 % WEIGHT BASED INFUSION: 1.0000 mL/kg/h | INTRAVENOUS | Status: DC

## 2015-12-18 MED ORDER — HEPARIN SODIUM (PORCINE) 1000 UNIT/ML IJ SOLN
INTRAMUSCULAR | Status: DC | PRN
  Administered 2015-12-18: 3000 [IU] via INTRAVENOUS
  Administered 2015-12-18: 6000 [IU] via INTRAVENOUS

## 2015-12-18 MED ORDER — ASPIRIN 81 MG PO CHEW
81.0000 mg | CHEWABLE_TABLET | Freq: Every day | ORAL | Status: DC
  Administered 2015-12-19: 08:00:00 81 mg via ORAL
  Filled 2015-12-18: qty 1

## 2015-12-18 MED ORDER — SODIUM CHLORIDE 0.9 % WEIGHT BASED INFUSION: 1.0000 mL/kg/h | INTRAVENOUS | Status: AC

## 2015-12-18 MED ORDER — AMLODIPINE BESYLATE 10 MG PO TABS
10.0000 mg | ORAL_TABLET | Freq: Every day | ORAL | Status: DC
  Administered 2015-12-19: 10 mg via ORAL
  Filled 2015-12-18: qty 1

## 2015-12-18 MED ORDER — ASPIRIN 81 MG PO CHEW
324.0000 mg | CHEWABLE_TABLET | Freq: Once | ORAL | Status: AC
  Administered 2015-12-18: 324 mg via ORAL
  Filled 2015-12-18: qty 4

## 2015-12-18 MED ORDER — HEPARIN SODIUM (PORCINE) 1000 UNIT/ML IJ SOLN
INTRAMUSCULAR | Status: AC
  Filled 2015-12-18: qty 1

## 2015-12-18 MED ORDER — HYDROMORPHONE HCL 1 MG/ML IJ SOLN
INTRAMUSCULAR | Status: AC
  Filled 2015-12-18: qty 1

## 2015-12-18 MED ORDER — ONDANSETRON HCL 4 MG/2ML IJ SOLN
INTRAMUSCULAR | Status: AC
  Filled 2015-12-18: qty 2

## 2015-12-18 MED ORDER — SERTRALINE HCL 50 MG PO TABS
100.0000 mg | ORAL_TABLET | Freq: Every day | ORAL | Status: DC
  Administered 2015-12-18 – 2015-12-19 (×2): 100 mg via ORAL
  Filled 2015-12-18 (×2): qty 2

## 2015-12-18 MED ORDER — LABETALOL HCL 5 MG/ML IV SOLN
INTRAVENOUS | Status: DC | PRN
  Administered 2015-12-18: 20 mg via INTRAVENOUS

## 2015-12-18 MED ORDER — IOPAMIDOL (ISOVUE-370) INJECTION 76%
INTRAVENOUS | Status: DC | PRN
  Administered 2015-12-18: 190 mL via INTRA_ARTERIAL

## 2015-12-18 SURGICAL SUPPLY — 22 items
BALLN EMERGE MR 2.5X15 (BALLOONS) ×3
BALLN EMERGE MR 3.0X15 (BALLOONS) ×3
BALLN ~~LOC~~ EUPHORA RX 4.0X15 (BALLOONS) ×3
BALLOON EMERGE MR 2.5X15 (BALLOONS) ×1 IMPLANT
BALLOON EMERGE MR 3.0X15 (BALLOONS) ×1 IMPLANT
BALLOON ~~LOC~~ EUPHORA RX 4.0X15 (BALLOONS) ×1 IMPLANT
CATH INFINITI 5 FR STR PIGTAIL (CATHETERS) ×3 IMPLANT
CATH OPTITORQUE TIG 4.0 5F (CATHETERS) ×3 IMPLANT
CATH VISTA GUIDE 6FR JR4 (CATHETERS) ×3 IMPLANT
CATH VISTA GUIDE 6FR XB3.5 (CATHETERS) ×3 IMPLANT
DEVICE RAD COMP TR BAND LRG (VASCULAR PRODUCTS) ×3 IMPLANT
GLIDESHEATH SLEND A-KIT 6F 20G (SHEATH) ×3 IMPLANT
KIT ENCORE 26 ADVANTAGE (KITS) ×3 IMPLANT
KIT HEART LEFT (KITS) ×3 IMPLANT
PACK CARDIAC CATHETERIZATION (CUSTOM PROCEDURE TRAY) ×3 IMPLANT
STENT RESOLUTE INTEG 3.5X12 (Permanent Stent) ×3 IMPLANT
STENT RESOLUTE INTEG 4.0X22 (Permanent Stent) ×3 IMPLANT
TRANSDUCER W/STOPCOCK (MISCELLANEOUS) ×3 IMPLANT
TUBING CIL FLEX 10 FLL-RA (TUBING) ×3 IMPLANT
WIRE ASAHI PROWATER 180CM (WIRE) ×3 IMPLANT
WIRE HI TORQ BMW 190CM (WIRE) ×3 IMPLANT
WIRE SAFE-T 1.5MM-J .035X260CM (WIRE) ×3 IMPLANT

## 2015-12-18 NOTE — ED Notes (Signed)
[place] completed

## 2015-12-18 NOTE — ED Provider Notes (Signed)
[facility] DEPT Provider Note   CSN: [id number] Arrival date & time: 12/18/15  1145     History   Chief Complaint Chief Complaint  Patient presents with  . Chest Pain    HPI [name] is a 60 y.o. male.Complains of anterior chest pain nonradiating substernal onset 3 nights ago. He's had 3 episodes over the past 3 nights each awakening from sleep and lasting for 45 minutes, resolve without treatment. Presently asymptomatic. Nothing makes symptoms better or worse. No other associated symptoms. Patient saw his primary care physician prior to coming here. Sent here for further evaluation  HPI  Past Medical History:  Diagnosis Date  . Anxiety   . Arthritis   . Dementia   . Depression   . Diabetes mellitus   . Gout   . Hypertension   . TIA (transient ischemic attack)     There are no active problems to display for this patient.   Past Surgical History:  Procedure Laterality Date  . HERNIA REPAIR         Home Medications    Prior to Admission medications   Medication Sig Start Date End Date Taking? Authorizing Provider  amLODipine (NORVASC) 10 MG tablet Take 10 mg by mouth daily. 11/11/15  Yes Historical Provider, MD  cloNIDine (CATAPRES) 0.2 MG tablet Take 0.2 mg by mouth 2 (two) times daily. 09/11/15  Yes Historical Provider, MD  colchicine 0.6 MG tablet Take 1 tablet (0.6 mg total) by mouth daily. 05/18/11 12/18/15 Yes Tiffany Carlota Raspberry, PA-C  indomethacin (INDOCIN) 50 MG capsule Take 1 capsule (50 mg total) by mouth 2 (two) times daily with a meal. Patient taking differently: Take 50 mg by mouth daily.  01/08/15  Yes Waynetta Pean, PA-C  nitroGLYCERIN (NITROSTAT) 0.4 MG SL tablet Place 0.4 mg under the tongue every 5 (five) minutes as needed. For chest pain   Yes Historical Provider, MD  sertraline (ZOLOFT) 100 MG tablet Take 1 tablet (100 mg total) by mouth daily. Patient taking differently: Take 200 mg by mouth daily.  08/04/11  Yes Kingsley Spittle, MD    amitriptyline (ELAVIL) 25 MG tablet Take 1 tablet (25 mg total) by mouth at bedtime. Patient not taking: Reported on 12/18/2015 08/04/11   Kingsley Spittle, MD  amLODipine (NORVASC) 5 MG tablet Take 1 tablet (5 mg total) by mouth daily. Patient not taking: Reported on 12/18/2015 08/04/11   Kingsley Spittle, MD  cloNIDine (CATAPRES) 0.1 MG tablet Take 1 tablet (0.1 mg total) by mouth 3 (three) times daily. Patient not taking: Reported on 12/18/2015 08/04/11   Kingsley Spittle, MD  HYDROcodone-acetaminophen (NORCO/VICODIN) 5-325 MG tablet Take 1 tablet by mouth every 4 (four) hours as needed. Patient not taking: Reported on 12/18/2015 10/13/15   Konrad Felix, PA  lisinopril-hydrochlorothiazide (PRINZIDE,ZESTORETIC) 20-12.5 MG per tablet Take 1 tablet by mouth daily. Patient not taking: Reported on 12/18/2015 08/04/11   Kingsley Spittle, MD  LORazepam (ATIVAN) 1 MG tablet Take 1 tablet (1 mg total) by mouth every morning. Patient not taking: Reported on 12/18/2015 08/04/11   Kingsley Spittle, MD  nebivolol (BYSTOLIC) 10 MG tablet Take 1 tablet (10 mg total) by mouth daily. Patient not taking: Reported on 12/18/2015 01/08/15   Waynetta Pean, PA-C  predniSONE (DELTASONE) 20 MG tablet Take 2 tablets (40 mg total) by mouth daily. Patient not taking: Reported on 12/18/2015 01/08/15   Waynetta Pean, PA-C  QUEtiapine (SEROQUEL XR) 400 MG 24 hr tablet Take 1 tablet (400 mg total) by mouth at  bedtime. Patient not taking: Reported on 12/18/2015 08/04/11   Kingsley Spittle, MD    Family History No family history on file.  Social History Social History  Substance Use Topics  . Smoking status: Current Every Day Smoker    Packs/day: 0.50  . Smokeless tobacco: Not on file  . Alcohol use No     Comment: former, last 5 yrs     Allergies   Review of patient's allergies indicates no known allergies.   Review of Systems Review of Systems  Constitutional: Negative.   HENT: Negative.   Respiratory: Negative.   Cardiovascular: Positive  for chest pain.       Syncope  Gastrointestinal: Negative.   Musculoskeletal: Negative.   Skin: Negative.   Allergic/Immunologic: Negative.   Neurological: Negative.   Psychiatric/Behavioral: Negative.   All other systems reviewed and are negative.    Physical Exam Updated Vital Signs BP 163/77   Pulse (!) 49   Temp 98.1 F (36.7 C) (Oral)   Resp 18   SpO2 99%   Physical Exam  Constitutional: He appears well-developed and well-nourished.  HENT:  Head: Normocephalic and atraumatic.  Eyes: Conjunctivae are normal. Pupils are equal, round, and reactive to light.  Neck: Neck supple. No tracheal deviation present. No thyromegaly present.  Cardiovascular: Normal rate and regular rhythm.   No murmur heard. Pulmonary/Chest: Effort normal and breath sounds normal.  Abdominal: Soft. Bowel sounds are normal. He exhibits no distension. There is no tenderness.  Musculoskeletal: Normal range of motion. He exhibits no edema or tenderness.  Neurological: He is alert. Coordination normal.  Skin: Skin is warm and dry. No rash noted.  Psychiatric: He has a normal mood and affect.  Nursing note and vitals reviewed.   Chest x-ray viewed by me ED Treatments / Results  Labs (all labs ordered are listed, but only abnormal results are displayed) Labs Reviewed  BASIC METABOLIC PANEL - Abnormal; Notable for the following:       Result Value   CO2 20 (*)    Glucose, Bld 108 (*)    Calcium 8.8 (*)    All other components within normal limits  CBC - Abnormal; Notable for the following:    Platelets 115 (*)    All other components within normal limits  I-STAT TROPOININ, ED - Abnormal; Notable for the following:    Troponin i, poc 0.20 (*)    All other components within normal limits    EKG  EKG Interpretation  Date/Time:  Thursday December 18 2015 12:09:52 EDT Ventricular Rate:  55 PR Interval:  170 QRS Duration: 88 QT Interval:  484 QTC Calculation: 463 R Axis:   33 Text  Interpretation:  Sinus bradycardia T wave abnormality, consider inferolateral ischemia Prolonged QT Abnormal ECG Abnormal ekg Confirmed by Carmin Muskrat  MD ([id number]) on 12/18/2015 12:17:46 PM Also confirmed by Winfred Leeds  MD, [place] [phone])  on 12/18/2015 1:00:30 PM       Radiology Dg Chest 2 View  Result Date: 12/18/2015 CLINICAL DATA:  Midline upper and lower chest pain for 3 days with sweats. EXAM: CHEST  2 VIEW COMPARISON:  03/25/2009 FINDINGS: The lungs are clear wiithout focal pneumonia, edema, pneumothorax or pleural effusion. The cardiopericardial silhouette is within normal limits for size. The visualized bony structures of the thorax are intact. IMPRESSION: No active cardiopulmonary disease. Electronically Signed   By: Misty Stanley M.D.   On: 12/18/2015 12:45  chestx-ray viewed by me Results for orders placed or performed during the  hospital encounter of [id number]  Basic metabolic panel  Result Value Ref Range   Sodium 138 135 - 145 mmol/L   Potassium 3.6 3.5 - 5.1 mmol/L   Chloride 109 101 - 111 mmol/L   CO2 20 (L) 22 - 32 mmol/L   Glucose, Bld 108 (H) 65 - 99 mg/dL   BUN 13 6 - 20 mg/dL   Creatinine, Ser 1.21 0.61 - 1.24 mg/dL   Calcium 8.8 (L) 8.9 - 10.3 mg/dL   GFR calc non Af Amer >60 >60 mL/min   GFR calc Af Amer >60 >60 mL/min   Anion gap 9 5 - 15  CBC  Result Value Ref Range   WBC 7.4 4.0 - 10.5 K/uL   RBC 4.49 4.22 - 5.81 MIL/uL   Hemoglobin 14.3 13.0 - 17.0 g/dL   HCT 41.1 39.0 - 52.0 %   MCV 91.5 78.0 - 100.0 fL   MCH 31.8 26.0 - 34.0 pg   MCHC 34.8 30.0 - 36.0 g/dL   RDW 14.0 11.5 - 15.5 %   Platelets 115 (L) 150 - 400 K/uL  I-stat troponin, ED  Result Value Ref Range   Troponin i, poc 0.20 (HH) 0.00 - 0.08 ng/mL   Comment NOTIFIED PHYSICIAN    Comment 3           Dg Chest 2 View  Result Date: 12/18/2015 CLINICAL DATA:  Midline upper and lower chest pain for 3 days with sweats. EXAM: CHEST  2 VIEW COMPARISON:  03/25/2009 FINDINGS: The lungs are clear  wiithout focal pneumonia, edema, pneumothorax or pleural effusion. The cardiopericardial silhouette is within normal limits for size. The visualized bony structures of the thorax are intact. IMPRESSION: No active cardiopulmonary disease. Electronically Signed   By: Misty Stanley M.D.   On: 12/18/2015 12:45    Procedures Procedures (including critical care time)  Medications Ordered in ED Medications  aspirin chewable tablet 324 mg (not administered)    Dr. Einar Gip consulted. He will see patient in hospital request heparin per pharmacy protocol. Admission to telemetry Aspirin. Ordered by me. Dr.Ganji plants take patient to catheterization laboratory Initial Impression / Assessment and Plan / ED Course  I have reviewed the triage vital signs and the nursing notes.  Pertinent labs & imaging results that were available during my care of the patient were reviewed by me and considered in my medical decision making (see chart for details).  Clinical Course      Final Clinical Impressions(s) / ED Diagnoses  Diagnoses #1NSTEMI #2tobacco abuse Final diagnoses:  None    New Prescriptions New Prescriptions   No medications on file     Orlie Dakin, MD 12/18/15 1326

## 2015-12-18 NOTE — Progress Notes (Signed)
TR BAND REMOVAL  LOCATION:    right radial  DEFLATED PER PROTOCOL:    Yes.    TIME BAND OFF / DRESSING APPLIED:    23:30   SITE UPON ARRIVAL:    Level 0  SITE AFTER BAND REMOVAL:    Level 0  CIRCULATION SENSATION AND MOVEMENT:    Within Normal Limits   Yes.    COMMENTS:   Post TR band instructions given. No bleeding; No hematoma. Pt tolerated well.

## 2015-12-18 NOTE — ED Triage Notes (Signed)
Pt sent here by his PCP due to "something not right with his EKG". He reports CP for the past 2 nights. Denies current CP.

## 2015-12-18 NOTE — Interval H&P Note (Signed)
History and Physical Interval Note:  12/18/2015 5:39 PM  [name]  has presented today for surgery, with the diagnosis of cp  The various methods of treatment have been discussed with the patient and family. After consideration of risks, benefits and other options for treatment, the patient has consented to  Procedure(s): Left Heart Cath and Coronary Angiography (N/A)  And possible PCI  as a surgical intervention .  The patient's history has been reviewed, patient examined, no change in status, stable for surgery.  I have reviewed the patient's chart and labs.  Questions were answered to the patient's satisfaction.   Cath Lab Visit (complete for each Cath Lab visit)  Clinical Evaluation Leading to the Procedure:   ACS: Yes.    Non-ACS:    Anginal Classification: CCS IV  Anti-ischemic medical therapy: Minimal Therapy (1 class of medications)  Non-Invasive Test Results: No non-invasive testing performed  Prior CABG: No previous CABG        Adrian Prows

## 2015-12-18 NOTE — ED Notes (Addendum)
Previous note charted in error

## 2015-12-18 NOTE — ED Triage Notes (Signed)
Dr. Einar Gip reports to nurse to call him upon pt arrival for possible cardiac cath.

## 2015-12-18 NOTE — Progress Notes (Signed)
ANTICOAGULATION CONSULT NOTE - Initial Consult  Pharmacy Consult for Heparin  Indication: chest pain/ACS  No Known Allergies  Patient Measurements:   Heparin Dosing Weight: 83.2 kg  Vital Signs: Temp: 98.1 F (36.7 C) (09/21 1208) Temp Source: Oral (09/21 1208) BP: 172/99 (09/21 1307) Pulse Rate: 57 (09/21 1307)  Labs:  Recent Labs  12/18/15 1217  HGB 14.3  HCT 41.1  PLT 115*  CREATININE 1.21    Medical History: Past Medical History:  Diagnosis Date  . Anxiety   . Arthritis   . Dementia   . Depression   . Diabetes mellitus   . Gout   . Hypertension   . TIA (transient ischemic attack)     Medications:  See EMR  Assessment: Patient presents to ED with complaints of nonradiating substernal chest pain starting three nights prior to admission. Patient visited PCP prior to admission and had an EKG with abnormal findings. Patient is not on anticoagulation therapy prior to admission. Troponin 0.2. Serum creatinine 1.21 mg/dL. CBC WNL. CrCl 95.13 ml/min.   Goal of Therapy:  Heparin level 0.3-0.7 units/ml Monitor platelets by anticoagulation protocol: Yes   Plan:  Give 4000 units bolus x 1  Start heparin drip 1000 units/hr Daily CBC and heparin level  Will obtain heparin level in 6 hours Follow-up cardiology recommendations regarding cardiac cath  [facility] 12/18/2015,1:41 PM

## 2015-12-18 NOTE — H&P (Signed)
[name] is an 60 y.o. male.   Chief Complaint: Chest Pain, NSTEMI HPI: [name]  is a 60 y.o. 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  He presented to his PCP this morning with complaints of chest pain for 3 days. Symptoms have occurred every morning for the past 3 mornings, waking him from sleep sometime between midnight and 4 a.m.  Chest pain was very severe this morning described as a heaviness associated with shortness of breath.  Symptoms last for approximately 45 minutes.  He was seen by his PCP and was then sent to the emergency department for symptoms suggestive of unstable angina.  He is presently pain-free and has been since arrival to the hospital.  He has a significant family history of premature CAD in his father who died at age 53 from a heart attack and in his mother died at age 54 from a heart attack, 1st CABG in her 36s. He also states he has a "stent in his groin".  When questioned specifically what this was for, he states "to control my blood pressure".  Suspect he underwent renal artery angioplasty for renovascular hypertension, however no records available to confirm this.  Past Medical History:  Diagnosis Date  . Anxiety   . Arthritis   . Dementia   . Depression   . Diabetes mellitus   . Gout   . Hypertension   . TIA (transient ischemic attack)     Past Surgical History:  Procedure Laterality Date  . HERNIA REPAIR      No family history on file. Social History:  reports that he has been smoking.  He has been smoking about 0.50 packs per day. He does not have any smokeless tobacco history on file. He reports that he does not drink alcohol or use drugs.  Allergies: No Known Allergies  Review of Systems - History obtained from the patient General ROS: negative for - fatigue, fever, malaise or night  sweats Respiratory ROS: positive for - shortness of breath negative for - cough, hemoptysis or orthopnea Cardiovascular ROS: positive for - chest pain negative for - edema, palpitations or paroxysmal nocturnal dyspnea Gastrointestinal ROS: no abdominal pain, change in bowel habits, or [place] or bloody stools Neurological ROS: no TIA or stroke symptoms  Blood pressure 172/99, pulse (!) 57, temperature 98.1 F (36.7 C), temperature source Oral, resp. rate 15, height '5\' 7"'  (1.702 m), weight 84.4 kg (186 lb), SpO2 100 %.   General appearance: alert, cooperative, appears stated age, no distress and moderately obese Eyes: negative Neck: no adenopathy, no carotid bruit, no JVD, supple, symmetrical, trachea midline and thyroid not enlarged, symmetric, no tenderness/mass/nodules Resp: rhonchi LLL Chest wall: no tenderness Cardio: regular rate and rhythm, S1, S2 normal, no murmur, click, rub or gallop GI: soft, non-tender; bowel sounds normal; no masses,  no organomegaly Extremities: extremities normal, atraumatic, no cyanosis or edema Pulses: 2+ and symmetric Skin: Skin color, texture, turgor normal. No rashes or lesions Neurologic: Grossly normal  Results for orders placed or performed during the hospital encounter of 12/18/15 (from the past 48 hour(s))  Basic metabolic panel     Status: Abnormal   Collection Time: 12/18/15 12:17 PM  Result Value Ref Range   Sodium 138 135 - 145 mmol/L   Potassium 3.6 3.5 - 5.1 mmol/L   Chloride 109 101 - 111 mmol/L  CO2 20 (L) 22 - 32 mmol/L   Glucose, Bld 108 (H) 65 - 99 mg/dL   BUN 13 6 - 20 mg/dL   Creatinine, Ser 1.21 0.61 - 1.24 mg/dL   Calcium 8.8 (L) 8.9 - 10.3 mg/dL   GFR calc non Af Amer >60 >60 mL/min   GFR calc Af Amer >60 >60 mL/min    Comment: (NOTE) The eGFR has been calculated using the CKD EPI equation. This calculation has not been validated in all clinical situations. eGFR's persistently <60 mL/min signify possible Chronic  Kidney Disease.    Anion gap 9 5 - 15  CBC     Status: Abnormal   Collection Time: 12/18/15 12:17 PM  Result Value Ref Range   WBC 7.4 4.0 - 10.5 K/uL   RBC 4.49 4.22 - 5.81 MIL/uL   Hemoglobin 14.3 13.0 - 17.0 g/dL   HCT 41.1 39.0 - 52.0 %   MCV 91.5 78.0 - 100.0 fL   MCH 31.8 26.0 - 34.0 pg   MCHC 34.8 30.0 - 36.0 g/dL   RDW 14.0 11.5 - 15.5 %   Platelets 115 (L) 150 - 400 K/uL    Comment: SPECIMEN CHECKED FOR CLOTS REPEATED TO VERIFY PLATELET COUNT CONFIRMED BY SMEAR   I-stat troponin, ED     Status: Abnormal   Collection Time: 12/18/15 12:31 PM  Result Value Ref Range   Troponin i, poc 0.20 (HH) 0.00 - 0.08 ng/mL   Comment NOTIFIED PHYSICIAN    Comment 3            Comment: Due to the release kinetics of cTnI, a negative result within the first hours of the onset of symptoms does not rule out myocardial infarction with certainty. If myocardial infarction is still suspected, repeat the test at appropriate intervals.    Dg Chest 2 View  Result Date: 12/18/2015 CLINICAL DATA:  Midline upper and lower chest pain for 3 days with sweats. EXAM: CHEST  2 VIEW COMPARISON:  03/25/2009 FINDINGS: The lungs are clear wiithout focal pneumonia, edema, pneumothorax or pleural effusion. The cardiopericardial silhouette is within normal limits for size. The visualized bony structures of the thorax are intact. IMPRESSION: No active cardiopulmonary disease. Electronically Signed   By: Misty Stanley M.D.   On: 12/18/2015 12:45    Labs:   Lab Results  Component Value Date   WBC 7.4 12/18/2015   HGB 14.3 12/18/2015   HCT 41.1 12/18/2015   MCV 91.5 12/18/2015   PLT 115 (L) 12/18/2015    Recent Labs Lab 12/18/15 1217  NA 138  K 3.6  CL 109  CO2 20*  BUN 13  CREATININE 1.21  CALCIUM 8.8*  GLUCOSE 108*    Lipid Panel     Component Value Date/Time   CHOL  05/02/2008 0355    90        ATP III CLASSIFICATION:  <200     mg/dL   Desirable  200-239  mg/dL   Borderline High   >=240    mg/dL   High          TRIG 30 05/02/2008 0355   HDL 25 (L) 05/02/2008 0355   CHOLHDL 3.6 05/02/2008 0355   VLDL 6 05/02/2008 0355   LDLCALC  05/02/2008 0355    59        Total Cholesterol/HDL:CHD Risk Coronary Heart Disease Risk Table  Men   Women  1/2 Average Risk   3.4   3.3  Average Risk       5.0   4.4  2 X Average Risk   9.6   7.1  3 X Average Risk  23.4   11.0        Use the calculated Patient Ratio above and the CHD Risk Table to determine the patient's CHD Risk.        ATP III CLASSIFICATION (LDL):  <100     mg/dL   Optimal  100-129  mg/dL   Near or Above                    Optimal  130-159  mg/dL   Borderline  160-189  mg/dL   High  >190     mg/dL   Very High    BNP (last 3 results) No results for input(s): BNP in the last 8760 hours.  HEMOGLOBIN A1C Lab Results  Component Value Date   HGBA1C  05/02/2008    5.8 (NOTE)   The ADA recommends the following therapeutic goal for glycemic   control related to Hgb A1C measurement:   Goal of Therapy:   < 7.0% Hgb A1C   Reference: American Diabetes Association: Clinical Practice   Recommendations 2008, Diabetes Care,  2008, 31:(Suppl 1).   MPG 120 05/02/2008    Cardiac Panel (last 3 results) No results for input(s): CKTOTAL, CKMB, TROPONINI, RELINDX in the last 8760 hours.  Lab Results  Component Value Date   CKTOTAL 75 03/26/2009   CKMB 1.1 03/26/2009   TROPONINI 0.03        NO INDICATION OF MYOCARDIAL INJURY. 03/26/2009     TSH No results for input(s): TSH in the last 8760 hours.  EKG 12/18/2015: Sinus bradycardia at a rate of 55 bpm, normal axis, poor R-wave progression, cannot exclude anterior infarct old.  Diffuse T-wave inversion, cannot exclude inferior and anterolateral ischemia versus LVH with repolarization abnormality.  QT/QTc 484/463 ms.  No prior EKG for comparison.   (Not in a hospital admission)    Current Facility-Administered Medications:  .  0.9 %  sodium  chloride infusion, , Intravenous, STAT, Sam Jacubowitz, MD .  heparin ADULT infusion 100 units/mL (25000 units/269m sodium chloride 0.45%), 1,000 Units/hr, Intravenous, Continuous, AJake ChurchMasters, RPH .  heparin bolus via infusion 4,000 Units, 4,000 Units, Intravenous, Once, AJake ChurchMasters, [facility] Current Outpatient Prescriptions:  .  amLODipine (NORVASC) 10 MG tablet, Take 10 mg by mouth daily., Disp: , Rfl: 0 .  cloNIDine (CATAPRES) 0.2 MG tablet, Take 0.2 mg by mouth 2 (two) times daily., Disp: , Rfl: 0 .  colchicine 0.6 MG tablet, Take 1 tablet (0.6 mg total) by mouth daily., Disp: 30 tablet, Rfl: 0 .  indomethacin (INDOCIN) 50 MG capsule, Take 1 capsule (50 mg total) by mouth 2 (two) times daily with a meal. (Patient taking differently: Take 50 mg by mouth daily. ), Disp: 30 capsule, Rfl: 0 .  nitroGLYCERIN (NITROSTAT) 0.4 MG SL tablet, Place 0.4 mg under the tongue every 5 (five) minutes as needed. For chest pain, Disp: , Rfl:  .  sertraline (ZOLOFT) 100 MG tablet, Take 1 tablet (100 mg total) by mouth daily. (Patient taking differently: Take 200 mg by mouth daily. ), Disp: 30 tablet, Rfl: 0 .  amitriptyline (ELAVIL) 25 MG tablet, Take 1 tablet (25 mg total) by mouth at bedtime. (Patient not taking: Reported on 12/18/2015), Disp: 30 tablet,  Rfl: 0 .  amLODipine (NORVASC) 5 MG tablet, Take 1 tablet (5 mg total) by mouth daily. (Patient not taking: Reported on 12/18/2015), Disp: 30 tablet, Rfl: 0 .  cloNIDine (CATAPRES) 0.1 MG tablet, Take 1 tablet (0.1 mg total) by mouth 3 (three) times daily. (Patient not taking: Reported on 12/18/2015), Disp: 60 tablet, Rfl: 0 .  HYDROcodone-acetaminophen (NORCO/VICODIN) 5-325 MG tablet, Take 1 tablet by mouth every 4 (four) hours as needed. (Patient not taking: Reported on 12/18/2015), Disp: 10 tablet, Rfl: 0 .  lisinopril-hydrochlorothiazide (PRINZIDE,ZESTORETIC) 20-12.5 MG per tablet, Take 1 tablet by mouth daily. (Patient not taking: Reported on 12/18/2015),  Disp: 30 tablet, Rfl: 0 .  LORazepam (ATIVAN) 1 MG tablet, Take 1 tablet (1 mg total) by mouth every morning. (Patient not taking: Reported on 12/18/2015), Disp: 20 tablet, Rfl: 0 .  nebivolol (BYSTOLIC) 10 MG tablet, Take 1 tablet (10 mg total) by mouth daily. (Patient not taking: Reported on 12/18/2015), Disp: 30 tablet, Rfl: 0 .  predniSONE (DELTASONE) 20 MG tablet, Take 2 tablets (40 mg total) by mouth daily. (Patient not taking: Reported on 12/18/2015), Disp: 10 tablet, Rfl: 0 .  QUEtiapine (SEROQUEL XR) 400 MG 24 hr tablet, Take 1 tablet (400 mg total) by mouth at bedtime. (Patient not taking: Reported on 12/18/2015), Disp: 30 tablet, Rfl: 0    Assessment/Plan 1. NSTEMI 2. Nonobstructive CAD by coronary angiogram in 2012 3. HTN 4. Low HDL 5. Tobacco use disorder  Recommendation: Patient complains of symptoms suggestive of unstable angina with initial troponin elevated at 0.2.  We'll start on heparin drip and plan for coronary angiogram and possible angioplasty this afternoon. We discussed regarding risks, benefits, alternatives to this including stress testing, CTA, and continued medical therapy. Patient wants to proceed. Understands <1-2% risk of death, stroke, MI, urgent CABG, bleeding, infection, renal failure but not limited to these. Last PO intake was last night. Lipid panel ordered for further risk stratification.  We'll continue to cycle troponin.  Dr. Einar Gip to see prior to cath and make further recommendations.   Rachel Bo, NP-C 12/18/2015, 2:25 PM [facility] [place] Pager: [phone] Office: [phone]

## 2015-12-19 ENCOUNTER — Encounter (HOSPITAL_COMMUNITY): Payer: Self-pay | Admitting: Cardiology

## 2015-12-19 LAB — BASIC METABOLIC PANEL
Anion gap: 10 (ref 5–15)
BUN: 12 mg/dL (ref 6–20)
CO2: 20 mmol/L — ABNORMAL LOW (ref 22–32)
Calcium: 8 mg/dL — ABNORMAL LOW (ref 8.9–10.3)
Chloride: 104 mmol/L (ref 101–111)
Creatinine, Ser: 1.08 mg/dL (ref 0.61–1.24)
GFR calc Af Amer: >60 mL/min (ref >60)
GFR calc non Af Amer: >60 mL/min (ref >60)
Glucose, Bld: 102 mg/dL — ABNORMAL HIGH (ref 65–99)
Potassium: 3.1 mmol/L — ABNORMAL LOW (ref 3.5–5.1)
Sodium: 134 mmol/L — ABNORMAL LOW (ref 135–145)

## 2015-12-19 LAB — CBC
HCT: 37 % — ABNORMAL LOW (ref 39.0–52.0)
Hemoglobin: 12.7 g/dL — ABNORMAL LOW (ref 13.0–17.0)
MCH: 31.7 pg (ref 26.0–34.0)
MCHC: 34.3 g/dL (ref 30.0–36.0)
MCV: 92.3 fL (ref 78.0–100.0)
Platelets: 82 10*3/uL — ABNORMAL LOW (ref 150–400)
RBC: 4.01 MIL/uL — ABNORMAL LOW (ref 4.22–5.81)
RDW: 14.4 % (ref 11.5–15.5)
WBC: 7.1 10*3/uL (ref 4.0–10.5)

## 2015-12-19 LAB — GLUCOSE, CAPILLARY: Glucose-Capillary: 125 mg/dL — ABNORMAL HIGH (ref 65–99)

## 2015-12-19 MED ORDER — BUPROPION HCL ER (SR) 150 MG PO TB12: 150.0000 mg | ORAL_TABLET | Freq: Two times a day (BID) | ORAL | 0 refills | Status: DC

## 2015-12-19 MED ORDER — ASPIRIN 81 MG PO CHEW: 81.0000 mg | CHEWABLE_TABLET | Freq: Every day | ORAL | 1 refills | Status: DC

## 2015-12-19 MED ORDER — POTASSIUM CHLORIDE ER 10 MEQ PO TBCR
40.0000 meq | EXTENDED_RELEASE_TABLET | Freq: Once | ORAL | Status: AC
  Administered 2015-12-19: 10:00:00 40 meq via ORAL
  Filled 2015-12-19: qty 4

## 2015-12-19 MED ORDER — METOPROLOL TARTRATE 100 MG PO TABS: 100.0000 mg | ORAL_TABLET | Freq: Two times a day (BID) | ORAL | 1 refills | Status: DC

## 2015-12-19 MED ORDER — TICAGRELOR 90 MG PO TABS
90.0000 mg | ORAL_TABLET | Freq: Two times a day (BID) | ORAL | 1 refills | Status: DC
Start: 2015-12-19 — End: 2019-07-10

## 2015-12-19 MED ORDER — BUPROPION HCL ER (SR) 150 MG PO TB12
150.0000 mg | ORAL_TABLET | Freq: Two times a day (BID) | ORAL | Status: DC
  Administered 2015-12-19: 150 mg via ORAL
  Filled 2015-12-19: qty 1

## 2015-12-19 MED ORDER — ATORVASTATIN CALCIUM 80 MG PO TABS: 80.0000 mg | ORAL_TABLET | Freq: Every day | ORAL | 1 refills | Status: DC

## 2015-12-19 NOTE — Care Management Note (Signed)
Case Management Note  Patient Details  Name: [name] MRN: [id number] Date of Birth: [birth year]  Subjective/Objective:    NSTEMI, patient is indep pta , a/p coronary intervention, will be on Brilinta , [facility] [address] has in stock.  He is for dc today, he has transportation and a pcp.  No other needs.                 Action/Plan:   Expected Discharge Date:  12/22/15               Expected Discharge Plan:  Home/Self Care  In-House Referral:     Discharge planning Services  CM Consult  Post Acute Care Choice:    Choice offered to:     DME Arranged:    DME Agency:     HH Arranged:    HH Agency:     Status of Service:  Completed, signed off  If discussed at [employer] of Stay Meetings, dates discussed:    Additional Comments:  Zenon Mayo, RN 12/19/2015, 10:31 AM

## 2015-12-19 NOTE — Progress Notes (Signed)
CARDIAC REHAB PHASE I   PRE:  Rate/Rhythm: 26 SR  BP:  Sitting: 173/71        SaO2: 99 RA  MODE:  Ambulation: 500 ft   POST:  Rate/Rhythm: 81 SR  BP:  Sitting: 180/77         SaO2: 96 RA  Pt ambulated 500 ft on RA, handheld assist, steady gait, tolerated well.  Pt c/o mild DOE, denies cp, dizziness, declined rest stop. Completed MI/stent education with pt and wife at bedside.  Reviewed risk factors, tobacco cessation (gave pt fake cigarette), MI book, anti-platelet therapy, stent card, activity restrictions, ntg, exercise, heart healthy diet, and phase 2 cardiac rehab. Pt verbalized understanding. Pt agrees to phase 2 cardiac rehab referral, will send to [facility] per pt request. Pt to bed per pt request after walk, call bell within reach. [id number] Lenna Sciara, RN, BSN 12/19/2015 9:02 AM

## 2015-12-19 NOTE — Discharge Instructions (Signed)
Acute Coronary Syndrome °Acute coronary syndrome (ACS) is a serious problem in which there is suddenly not enough blood and oxygen supplied to the heart. ACS may mean that one or more of the blood vessels in your heart (coronary arteries) may be blocked. ACS can result in chest pain or a heart attack (myocardial infarction or MI). °CAUSES °This condition is caused by atherosclerosis, which is the buildup of fat and cholesterol (plaque) on the inside of the arteries. Over time, the plaque may narrow or block the artery, and this will lessen blood flow to the heart. Plaque can also become weak and break off within a coronary artery to form a clot and cause a sudden blockage. °RISK FACTORS °The risks factors of this condition include: °· High cholesterol levels. °· High blood pressure (hypertension). °· Smoking. °· Diabetes. °· Age. °· Family history of chest pain, heart disease, or stroke. °· Lack of exercise. °SYMPTOMS °The most common signs of this condition include: °· Chest pain, which can be: °¨ A crushing or squeezing in the chest. °¨ A tightness, pressure, fullness, or heaviness in the chest. °¨ Present for more than a few minutes, or it can stop and recur. °· Pain in the arms, neck, jaw, or back. °· Unexplained heartburn or indigestion. °· Shortness of breath. °· Nausea. °· Sudden cold sweats. °· Feeling light-headed or dizzy. °Sometimes, this condition has no symptoms. °DIAGNOSIS °ACS may be diagnosed through the following tests: °· Electrocardiogram (ECG). °· Blood tests. °· Coronary angiogram. This is a procedure to look at the coronary arteries to see if there is any blockage. °TREATMENT °Treatment for ACS may include: °· Healthy behavioral changes to reduce or control risk factors. °· Medicine. °· Coronary stenting. A stent helps to keep an artery open. °· Coronary angioplasty. This procedure widens a narrowed or blocked artery. °· Coronary artery bypass surgery. This will allow your blood to pass the  blockage (bypass) to reach your heart. °HOME CARE INSTRUCTIONS °Eating and Drinking °· Follow a heart-healthy diet. A dietitian can you help to educate you about healthy food options and changes. °· Use healthy cooking methods such as roasting, grilling, broiling, baking, poaching, steaming, or stir-frying. Talk to a dietitian to learn more about healthy cooking methods. °Medicines °· Take medicines only as directed by your health care provider. °· Do not take the following medicines unless your health care provider approves: °¨ Nonsteroidal anti-inflammatory drugs (NSAIDs), such as ibuprofen, naproxen, or celecoxib. °¨ Vitamin supplements that contain vitamin A, vitamin E, or both. °¨ Hormone replacement therapy that contains estrogen with or without progestin. °· Stop illegal drug use. °Activities °· Follow an exercise program that is approved by your health care provider. °· Plan rest periods when you are fatigued. °Lifestyle °· Do not use any tobacco products, including cigarettes, chewing tobacco, or electronic cigarettes. If you need help quitting, ask your health care provider. °· If you drink alcohol, and your health care provider approves, limit your alcohol intake to no more than 1 drink per day. One drink equals 12 ounces of beer, 5 ounces of wine, or 1½ ounces of hard liquor. °· Learn to manage stress. °· Maintain a healthy weight. Lose weight as approved by your health care provider. °General Instructions °· Manage other health conditions, such as hypertension and diabetes, as directed by your health care provider. °· Keep all follow-up visits as directed by your health care provider. This is important. °· Your health care provider may ask you to monitor your blood   pressure. A blood pressure reading consists of a higher number over a lower number, such as 110 over 72, written as 110/72. Ideally, your blood pressure should be:  Below 140/90 if you have no other medical conditions.  Below 130/80 if  you have diabetes or kidney disease. SEEK IMMEDIATE MEDICAL CARE IF:  You have pain in your chest, neck, arm, jaw, stomach, or back that lasts more than a few minutes, is recurring, or is not relieved by taking medicine under your tongue (sublingual nitroglycerin).  You have profuse sweating without cause.  You have unexplained:  Heartburn or indigestion.  Shortness of breath or difficulty breathing.  Nausea or vomiting.  Fatigue.  Feelings of nervousness or anxiety.  Weakness.  Diarrhea.  You have sudden light-headedness or dizziness.  You faint. These symptoms may represent a serious problem that is an emergency. Do not wait to see if the symptoms will go away. Get medical help right away. Call your local emergency services (911 in the [place] Do not drive yourself to the clinic or hospital.   This information is not intended to replace advice given to you by your health care provider. Make sure you discuss any questions you have with your health care provider.   Document Released: 03/15/2005 Document Revised: 04/05/2014 Document Reviewed: 07/17/2013 Elsevier Interactive Patient Education 2016 [place] Refer to this sheet in the next few weeks. These instructions provide you with information about caring for yourself after your procedure. Your health care provider may also give you more specific instructions. Your treatment has been planned according to current medical practices, but problems sometimes occur. Call your health care provider if you have any problems or questions after your procedure. WHAT TO EXPECT AFTER THE PROCEDURE After your procedure, it is typical to have the following:  Bruising at the radial site that usually fades within 1-2 weeks.  Blood collecting in the tissue (hematoma) that may be painful to the touch. It should usually decrease in size and tenderness within 1-2 weeks. HOME CARE INSTRUCTIONS  Take medicines only as  directed by your health care provider.  You may shower 24-48 hours after the procedure or as directed by your health care provider. Remove the bandage (dressing) and gently wash the site with plain soap and water. Pat the area dry with a clean towel. Do not rub the site, because this may cause bleeding.  Do not take baths, swim, or use a hot tub until your health care provider approves.  Check your insertion site every day for redness, swelling, or drainage.  Do not apply powder or lotion to the site.  Do not flex or bend the affected arm for 24 hours or as directed by your health care provider.  Do not push or pull heavy objects with the affected arm for 24 hours or as directed by your health care provider.  Do not lift over 10 lb (4.5 kg) for 5 days after your procedure or as directed by your health care provider.  Ask your health care provider when it is okay to:  Return to work or school.  Resume usual physical activities or sports.  Resume sexual activity.  Do not drive home if you are discharged the same day as the procedure. Have someone else drive you.  You may drive 24 hours after the procedure unless otherwise instructed by your health care provider.  Do not operate machinery or power tools for 24 hours after the procedure.  If your  procedure was done as an outpatient procedure, which means that you went home the same day as your procedure, a responsible adult should be with you for the first 24 hours after you arrive home.  Keep all follow-up visits as directed by your health care provider. This is important. SEEK MEDICAL CARE IF:  You have a fever.  You have chills.  You have increased bleeding from the radial site. Hold pressure on the site. SEEK IMMEDIATE MEDICAL CARE IF:  You have unusual pain at the radial site.  You have redness, warmth, or swelling at the radial site.  You have drainage (other than a small amount of blood on the dressing) from the  radial site.  The radial site is bleeding, and the bleeding does not stop after 30 minutes of holding steady pressure on the site.  Your arm or hand becomes pale, cool, tingly, or numb.   This information is not intended to replace advice given to you by your health care provider. Make sure you discuss any questions you have with your health care provider.   Document Released: 04/17/2010 Document Revised: 04/05/2014 Document Reviewed: 10/01/2013 Elsevier Interactive Patient Education 2016 [employer].  Sexual Activity and Heart Disease Sexual intimacy is an important part of your well-being. After a cardiac event, you may be worried about having sex. Most people can continue to have an active sex life after a cardiac event. If you or your partner have any questions about sexual activity, be sure to talk to your health care provider.  WHEN CAN YOU HAVE SEX AFTER A CARDIAC EVENT?  Resuming sexual activity (intercourse, masturbation, or oral sex) depends on the type of cardiac event you had. If you had a heart attack, it may be okay to have sex after 10 days. If you had a complicated cardiac event or heart surgery, you may not be able to have sex for as long as 8 weeks. Talk to your health care provider about safely resuming sex after your cardiac event. HOW DO YOU KNOW IF YOU ARE READY TO RETURN TO SEXUAL ACTIVITY? Returning to sexual activity depends on your physical comfort, mental readiness, and previous sexual habits. Physically, sexual activity is no more work than climbing two flights of stairs or walking briskly for 20 minutes. If you can do these activities without having angina, having shortness of breath, or becoming overly tired, it is okay to have sex. WHAT DO I NEED TO KNOW ABOUT SEXUAL ACTIVITY AFTER A CARDIAC EVENT? After a cardiac event, some prescribed medicines may affect your sexual function. These effects can include little or no desire for sex, difficulty achieving or  maintaining an erection, decreased vaginal lubrication, or the inability to achieve an orgasm. If any of these happen, do not stop taking your medicine. Talk to your health care provider about sexual dysfunction problems.   Talk to your health care provider before taking herbal medicines or vitamins. These can interfere with prescribed medicines and heart function.  If you take medicine for sexual dysfunction, avoid medicine such as nitroglycerin or long-acting nitrate medicine for 24 hours. Using a sexual dysfunction drug and a nitrate medicine together can cause a serious drop in blood pressure.  If you have angina and have taken a sexual dysfunction drug, go to your local emergency department for treatment. The emotional stress of a cardiac event can affect you and your partner's intimacy. It is important to:  Choose a relaxing atmosphere.  Talk openly and honestly with each other.  Be patient with each other.  Increase and strengthen intimacy by doing things such as caressing, touching, and holding each other. HOW DO I CARE FOR MYSELF AT HOME?  Avoid having sex after a heavy meal.  Avoid having too much alcohol before sex.  Participating in a cardiac rehabilitation program or regular exercise can benefit your sex life. It builds strength and endurance. After exercising, have sex when you feel rested and relaxed.  Ask your partner to take a more active role during sex.  Ask your health care provider when it is okay to have anal sex.  If you have angina during sex and are not on a sexual dysfunction drug, stop having sex and take a nitroglycerin as directed. If the angina goes away, you may resume sexual activity.  Always talk to your health care provider if you are experiencing sexual dysfunction, depression, or any type of pain.   This information is not intended to replace advice given to you by your health care provider. Make sure you discuss any questions you have with your  health care provider.   Document Released: 01/03/2013 Document Revised: 04/05/2014 Document Reviewed: 01/03/2013 Elsevier Interactive Patient Education [employer].

## 2015-12-19 NOTE — Discharge Summary (Signed)
Physician Discharge Summary  Patient ID: [name] MRN: [id number] DOB/AGE: January 07, 1956 60 y.o.  Admit date: 12/18/2015 Discharge date: 12/19/2015  Discharge Diagnoses:  1. NSTEMI 2. CAD s/p PTCA and stenting of the proximal and mid LAD with 2 overlapping 4.0 x 22 and 3.5 x 12 mm resolute integrity DES, stenosis reduced from 99% to 0%, and PTCA and balloon angioplasty of the distal PDA with a 2.5 x 15 mm Emerge with reduction of stenosis from 90% to less than 30%. 3. HTN 4. Low HDL at 17 5. Tobacco use disorder  Significant Diagnostic Studies: Coronary Angiogram 12/19/2015: 1. Severe diffuse coronary artery disease, scattered disease evident throughout the RCA, PDA has tandem 60% and a 90% stenosis. Distal lesion reduced to less than 30% with balloon angioplasty. 2. High-grade complex 99% stenosis of the proximal LAD. Successful angioplasty as noted above. 3. Mild disease in the circumflex with intermediate lesions 60-70% in OM 3 and OM 4 and distal circumflex AV groove branch.  Hospital Course: [name]  is a 60 y.o. 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  He presented to his PCP on the morning of 12/18/2015 with complaints of chest pain for 3 days. Symptoms have occurred every morning for the past 3 mornings, waking him from sleep sometime between midnight and 4 a.m.  Chest pain was very severe yesterday morning described as a heaviness associated with shortness of breath. Symptoms last for approximately 45 minutes.  He was seen by his PCP and was then sent to the emergency department for symptoms suggestive of unstable angina.  He was pain-free on arrival to the hospital.  He has a significant family history of premature CAD in his father who died at age 27 from a heart attack and in his mother died at age 65 from a heart attack, 1st  CABG in her 49s.  He underwent coronary angiogram on 12/18/2015 and successful PTCA and stenting of the proximal and mid LAD with 2 overlapping 4.0 x 22 and 3.5 x 12 mm resolute integrity DES, stenosis reduced from 99% to 0%, and PTCA and balloon angioplasty of the distal PDA with a 2.5 x 15 mm Emerge with reduction of stenosis from 90% to less than 30%. No recurrence of chest pain since.  Recommendations on discharge: He will need aggressive risk factor modification, smoking cessation, statin therapy, and dual antiplatelet therapy. Patient is very motivated in smoking cessation, requesting medical therapy for assistance. Will start him on Zyban. Discontinue amitriptyline due to borderline prolonged QT interval and will also stop indomethacin to avoid NSAID use following cardiac event.  40 mEq K-Dur given for hypokalemia.  We'll recheck this at follow-up visit.  Patient will also need screening and evaluation for vascular disease on an outpatient basis. Blood pressure elevated, however normalizes after administration of blood pressure medications.  We will uptitrate antihypertensives as necessary on an outpatient basis. Follow-up in 1-1/2 weeks as scheduled.  Discharge Exam: Blood pressure (!) 144/90, pulse 65, temperature 97.9 F (36.6 C), temperature source Axillary, resp. rate 20, height 5\' 6"  (1.676 m), weight 88.4 kg (194 lb 14.2 oz), SpO2 98 %.    General appearance: alert, cooperative, appears stated age, no distress and moderately obese Eyes: negative Neck: no adenopathy, no JVD, supple, symmetrical, trachea midline and thyroid not enlarged, symmetric, no tenderness/mass/nodules, right carotid bruit Resp: CTA Chest wall: no tenderness  Cardio: regular rate and rhythm, S1, S2 normal, no murmur, click, rub or gallop GI: soft, non-tender; bowel sounds normal; no masses,  no organomegaly Extremities: extremities normal, atraumatic, no cyanosis or edema Pulses: right popliteal pulse is diminished  compared to left and right pedal pulse is feeble and left normal. Skin: Skin color, texture, turgor normal. No rashes or lesions Neurologic: Grossly normal  Labs:   Lab Results  Component Value Date   WBC 7.1 12/19/2015   HGB 12.7 (L) 12/19/2015   HCT 37.0 (L) 12/19/2015   MCV 92.3 12/19/2015   PLT 82 (L) 12/19/2015    Recent Labs Lab 12/19/15 0324  NA 134*  K 3.1*  CL 104  CO2 20*  BUN 12  CREATININE 1.08  CALCIUM 8.0*  GLUCOSE 102*    Lipid Panel     Component Value Date/Time   CHOL 88 12/18/2015 1525   TRIG 127 12/18/2015 1525   HDL 17 (L) 12/18/2015 1525   CHOLHDL 5.2 12/18/2015 1525   VLDL 25 12/18/2015 1525   LDLCALC 46 12/18/2015 1525    BNP (last 3 results) No results for input(s): BNP in the last 8760 hours.  HEMOGLOBIN A1C Lab Results  Component Value Date   HGBA1C  05/02/2008    5.8 (NOTE)   The ADA recommends the following therapeutic goal for glycemic   control related to Hgb A1C measurement:   Goal of Therapy:   < 7.0% Hgb A1C   Reference: American Diabetes Association: Clinical Practice   Recommendations 2008, Diabetes Care,  2008, 31:(Suppl 1).   MPG 120 05/02/2008    Cardiac Panel (last 3 results)  Recent Labs  12/18/15 1525  TROPONINI 0.27*    Lab Results  Component Value Date   CKTOTAL 75 03/26/2009   CKMB 1.1 03/26/2009   TROPONINI 0.27 (HH) 12/18/2015     TSH No results for input(s): TSH in the last 8760 hours.  EKG 12/19/2015: Sinus rhythm at a rate of 62 bpm, normal axis, poor R-wave progression, cannot exclude anterior infarct old, anterolateral T-wave inversion, cannot exclude ischemia.  PAC. QT/QTC 498/55 ms.  Compared to EKG on 12/18/2015, and inferior T wave inversion has resolved.   Radiology: Dg Chest 2 View  Result Date: 12/18/2015 CLINICAL DATA:  Midline upper and lower chest pain for 3 days with sweats. EXAM: CHEST  2 VIEW COMPARISON:  03/25/2009 FINDINGS: The lungs are clear wiithout focal pneumonia, edema,  pneumothorax or pleural effusion. The cardiopericardial silhouette is within normal limits for size. The visualized bony structures of the thorax are intact. IMPRESSION: No active cardiopulmonary disease. Electronically Signed   By: Misty Stanley M.D.   On: 12/18/2015 12:45   FOLLOW UP PLANS AND APPOINTMENTS    Medication List    STOP taking these medications   amitriptyline 25 MG tablet Commonly known as:  ELAVIL   indomethacin 50 MG capsule Commonly known as:  INDOCIN   nebivolol 10 MG tablet Commonly known as:  BYSTOLIC     TAKE these medications   amLODipine 10 MG tablet Commonly known as:  NORVASC Take 10 mg by mouth daily. What changed:  Another medication with the same name was removed. Continue taking this medication, and follow the directions you see here.   aspirin 81 MG chewable tablet Chew 1 tablet (81 mg total) by mouth daily.   atorvastatin 80 MG tablet Commonly known as:  LIPITOR Take 1 tablet (80 mg total) by mouth daily at 6 PM.   buPROPion 150  MG 12 hr tablet Commonly known as:  WELLBUTRIN SR Take 1 tablet (150 mg total) by mouth 2 (two) times daily.   cloNIDine 0.2 MG tablet Commonly known as:  CATAPRES Take 0.2 mg by mouth 2 (two) times daily. What changed:  Another medication with the same name was removed. Continue taking this medication, and follow the directions you see here.   colchicine 0.6 MG tablet Take 1 tablet (0.6 mg total) by mouth daily.   HYDROcodone-acetaminophen 5-325 MG tablet Commonly known as:  NORCO/VICODIN Take 1 tablet by mouth every 4 (four) hours as needed.   lisinopril-hydrochlorothiazide 20-12.5 MG tablet Commonly known as:  PRINZIDE,ZESTORETIC Take 1 tablet by mouth daily.   LORazepam 1 MG tablet Commonly known as:  ATIVAN Take 1 tablet (1 mg total) by mouth every morning.   metoprolol 100 MG tablet Commonly known as:  LOPRESSOR Take 1 tablet (100 mg total) by mouth 2 (two) times daily.   nitroGLYCERIN 0.4 MG SL  tablet Commonly known as:  NITROSTAT Place 0.4 mg under the tongue every 5 (five) minutes as needed. For chest pain   predniSONE 20 MG tablet Commonly known as:  DELTASONE Take 2 tablets (40 mg total) by mouth daily.   QUEtiapine 400 MG 24 hr tablet Commonly known as:  SEROQUEL XR Take 1 tablet (400 mg total) by mouth at bedtime.   sertraline 100 MG tablet Commonly known as:  ZOLOFT Take 1 tablet (100 mg total) by mouth daily. What changed:  how much to take   ticagrelor 90 MG Tabs tablet Commonly known as:  BRILINTA Take 1 tablet (90 mg total) by mouth 2 (two) times daily.      Follow-up Information    Rachel Bo, NP Follow up on 12/29/2015.   Specialty:  Nurse Practitioner Why:  at 2:00pm. Arrive at 1:30pm to fill out new patient paperwork. Contact information: [address] [place] [address] [phone]            Rachel Bo, NP-C 12/19/2015, 8:16 AM [facility] Pager: [phone] Office: [phone]

## 2015-12-22 MED FILL — Heparin Sodium (Porcine) Inj 1000 Unit/ML: INTRAMUSCULAR | Qty: 10 | Status: AC

## 2015-12-28 DIAGNOSIS — N179 Acute kidney failure, unspecified: Secondary | ICD-10-CM

## 2015-12-28 HISTORY — DX: Acute kidney failure, unspecified: N17.9

## 2016-01-07 ENCOUNTER — Encounter (HOSPITAL_COMMUNITY): Payer: Self-pay | Admitting: Family Medicine

## 2016-01-07 ENCOUNTER — Inpatient Hospital Stay (HOSPITAL_COMMUNITY)
Admission: EM | Admit: 2016-01-07 | Discharge: 2016-01-13 | DRG: 683 | Disposition: A | Discharge status: Home / Self Care | Payer: Medicaid Other | Attending: Internal Medicine | Admitting: Internal Medicine

## 2016-01-07 ENCOUNTER — Emergency Department (HOSPITAL_COMMUNITY): Payer: Medicaid Other

## 2016-01-07 ENCOUNTER — Other Ambulatory Visit: Payer: Self-pay

## 2016-01-07 DIAGNOSIS — E872 Acidosis: Secondary | ICD-10-CM | POA: Diagnosis present

## 2016-01-07 DIAGNOSIS — I2510 Atherosclerotic heart disease of native coronary artery without angina pectoris: Secondary | ICD-10-CM | POA: Diagnosis present

## 2016-01-07 DIAGNOSIS — D125 Benign neoplasm of sigmoid colon: Secondary | ICD-10-CM | POA: Diagnosis not present

## 2016-01-07 DIAGNOSIS — R066 Hiccough: Secondary | ICD-10-CM | POA: Diagnosis not present

## 2016-01-07 DIAGNOSIS — F0390 Unspecified dementia without behavioral disturbance: Secondary | ICD-10-CM | POA: Diagnosis present

## 2016-01-07 DIAGNOSIS — K591 Functional diarrhea: Secondary | ICD-10-CM | POA: Diagnosis not present

## 2016-01-07 DIAGNOSIS — E119 Type 2 diabetes mellitus without complications: Secondary | ICD-10-CM | POA: Diagnosis present

## 2016-01-07 DIAGNOSIS — I150 Renovascular hypertension: Secondary | ICD-10-CM | POA: Diagnosis present

## 2016-01-07 DIAGNOSIS — R34 Anuria and oliguria: Secondary | ICD-10-CM | POA: Diagnosis not present

## 2016-01-07 DIAGNOSIS — N179 Acute kidney failure, unspecified: Secondary | ICD-10-CM | POA: Diagnosis not present

## 2016-01-07 DIAGNOSIS — Z8673 Personal history of transient ischemic attack (TIA), and cerebral infarction without residual deficits: Secondary | ICD-10-CM | POA: Diagnosis not present

## 2016-01-07 DIAGNOSIS — T465X5A Adverse effect of other antihypertensive drugs, initial encounter: Secondary | ICD-10-CM | POA: Diagnosis present

## 2016-01-07 DIAGNOSIS — Z720 Tobacco use: Secondary | ICD-10-CM

## 2016-01-07 DIAGNOSIS — K922 Gastrointestinal hemorrhage, unspecified: Secondary | ICD-10-CM | POA: Diagnosis not present

## 2016-01-07 DIAGNOSIS — E861 Hypovolemia: Secondary | ICD-10-CM | POA: Diagnosis present

## 2016-01-07 DIAGNOSIS — M109 Gout, unspecified: Secondary | ICD-10-CM | POA: Diagnosis present

## 2016-01-07 DIAGNOSIS — R197 Diarrhea, unspecified: Secondary | ICD-10-CM | POA: Diagnosis present

## 2016-01-07 DIAGNOSIS — K6289 Other specified diseases of anus and rectum: Secondary | ICD-10-CM

## 2016-01-07 DIAGNOSIS — K7689 Other specified diseases of liver: Secondary | ICD-10-CM | POA: Diagnosis not present

## 2016-01-07 DIAGNOSIS — K629 Disease of anus and rectum, unspecified: Secondary | ICD-10-CM | POA: Diagnosis not present

## 2016-01-07 DIAGNOSIS — E860 Dehydration: Secondary | ICD-10-CM | POA: Diagnosis present

## 2016-01-07 DIAGNOSIS — I252 Old myocardial infarction: Secondary | ICD-10-CM | POA: Diagnosis not present

## 2016-01-07 DIAGNOSIS — N170 Acute kidney failure with tubular necrosis: Principal | ICD-10-CM | POA: Diagnosis present

## 2016-01-07 DIAGNOSIS — E785 Hyperlipidemia, unspecified: Secondary | ICD-10-CM | POA: Diagnosis present

## 2016-01-07 DIAGNOSIS — D696 Thrombocytopenia, unspecified: Secondary | ICD-10-CM | POA: Diagnosis present

## 2016-01-07 DIAGNOSIS — Z955 Presence of coronary angioplasty implant and graft: Secondary | ICD-10-CM

## 2016-01-07 DIAGNOSIS — D62 Acute posthemorrhagic anemia: Secondary | ICD-10-CM | POA: Diagnosis not present

## 2016-01-07 DIAGNOSIS — F329 Major depressive disorder, single episode, unspecified: Secondary | ICD-10-CM | POA: Diagnosis present

## 2016-01-07 DIAGNOSIS — K921 Melena: Secondary | ICD-10-CM | POA: Diagnosis not present

## 2016-01-07 DIAGNOSIS — R945 Abnormal results of liver function studies: Secondary | ICD-10-CM

## 2016-01-07 DIAGNOSIS — Z8249 Family history of ischemic heart disease and other diseases of the circulatory system: Secondary | ICD-10-CM

## 2016-01-07 DIAGNOSIS — R1084 Generalized abdominal pain: Secondary | ICD-10-CM

## 2016-01-07 DIAGNOSIS — E876 Hypokalemia: Secondary | ICD-10-CM | POA: Diagnosis present

## 2016-01-07 DIAGNOSIS — R140 Abdominal distension (gaseous): Secondary | ICD-10-CM

## 2016-01-07 DIAGNOSIS — F419 Anxiety disorder, unspecified: Secondary | ICD-10-CM | POA: Diagnosis present

## 2016-01-07 DIAGNOSIS — F17210 Nicotine dependence, cigarettes, uncomplicated: Secondary | ICD-10-CM | POA: Diagnosis present

## 2016-01-07 DIAGNOSIS — I4581 Long QT syndrome: Secondary | ICD-10-CM | POA: Diagnosis present

## 2016-01-07 DIAGNOSIS — R680 Hypothermia, not associated with low environmental temperature: Secondary | ICD-10-CM | POA: Diagnosis present

## 2016-01-07 DIAGNOSIS — M1990 Unspecified osteoarthritis, unspecified site: Secondary | ICD-10-CM | POA: Diagnosis present

## 2016-01-07 DIAGNOSIS — K5730 Diverticulosis of large intestine without perforation or abscess without bleeding: Secondary | ICD-10-CM | POA: Diagnosis present

## 2016-01-07 DIAGNOSIS — I959 Hypotension, unspecified: Secondary | ICD-10-CM | POA: Diagnosis present

## 2016-01-07 DIAGNOSIS — T68XXXA Hypothermia, initial encounter: Secondary | ICD-10-CM

## 2016-01-07 DIAGNOSIS — Z7982 Long term (current) use of aspirin: Secondary | ICD-10-CM

## 2016-01-07 DIAGNOSIS — Z7901 Long term (current) use of anticoagulants: Secondary | ICD-10-CM

## 2016-01-07 DIAGNOSIS — K648 Other hemorrhoids: Secondary | ICD-10-CM | POA: Diagnosis present

## 2016-01-07 DIAGNOSIS — N2589 Other disorders resulting from impaired renal tubular function: Secondary | ICD-10-CM | POA: Insufficient documentation

## 2016-01-07 DIAGNOSIS — R7989 Other specified abnormal findings of blood chemistry: Secondary | ICD-10-CM

## 2016-01-07 LAB — URINE MICROSCOPIC-ADD ON

## 2016-01-07 LAB — COMPREHENSIVE METABOLIC PANEL
ALT: 105 U/L — ABNORMAL HIGH (ref 17–63)
AST: 73 U/L — ABNORMAL HIGH (ref 15–41)
Albumin: 2.6 g/dL — ABNORMAL LOW (ref 3.5–5.0)
Alkaline Phosphatase: 79 U/L (ref 38–126)
Anion gap: 20 — ABNORMAL HIGH (ref 5–15)
BUN: 90 mg/dL — ABNORMAL HIGH (ref 6–20)
CO2: 15 mmol/L — ABNORMAL LOW (ref 22–32)
Calcium: 8.2 mg/dL — ABNORMAL LOW (ref 8.9–10.3)
Chloride: 98 mmol/L — ABNORMAL LOW (ref 101–111)
Creatinine, Ser: 8.37 mg/dL — ABNORMAL HIGH (ref 0.61–1.24)
GFR calc Af Amer: 7 mL/min — ABNORMAL LOW (ref >60)
GFR calc non Af Amer: 6 mL/min — ABNORMAL LOW (ref >60)
Glucose, Bld: 122 mg/dL — ABNORMAL HIGH (ref 65–99)
Potassium: 2.3 mmol/L — CL (ref 3.5–5.1)
Sodium: 133 mmol/L — ABNORMAL LOW (ref 135–145)
Total Bilirubin: 0.3 mg/dL (ref 0.3–1.2)
Total Protein: 6.7 g/dL (ref 6.5–8.1)

## 2016-01-07 LAB — CREATININE, SERUM
Creatinine, Ser: 6.34 mg/dL — ABNORMAL HIGH (ref 0.61–1.24)
GFR calc Af Amer: 10 mL/min — ABNORMAL LOW (ref >60)
GFR calc non Af Amer: 9 mL/min — ABNORMAL LOW (ref >60)

## 2016-01-07 LAB — URINALYSIS, ROUTINE W REFLEX MICROSCOPIC
Glucose, UA: NEGATIVE mg/dL
Ketones, ur: NEGATIVE mg/dL
Nitrite: NEGATIVE
Protein, ur: 30 mg/dL — AB
Specific Gravity, Urine: 1.016 (ref 1.005–1.030)
pH: 5 (ref 5.0–8.0)

## 2016-01-07 LAB — CBC WITH DIFFERENTIAL/PLATELET
Basophils Absolute: 0 10*3/uL (ref 0.0–0.1)
Basophils Relative: 0 %
Eosinophils Absolute: 0.2 10*3/uL (ref 0.0–0.7)
Eosinophils Relative: 2 %
HCT: 34.8 % — ABNORMAL LOW (ref 39.0–52.0)
Hemoglobin: 12.3 g/dL — ABNORMAL LOW (ref 13.0–17.0)
Lymphocytes Relative: 27 %
Lymphs Abs: 2.3 10*3/uL (ref 0.7–4.0)
MCH: 30.7 pg (ref 26.0–34.0)
MCHC: 35.3 g/dL (ref 30.0–36.0)
MCV: 86.8 fL (ref 78.0–100.0)
Monocytes Absolute: 0.7 10*3/uL (ref 0.1–1.0)
Monocytes Relative: 8 %
Neutro Abs: 5.5 10*3/uL (ref 1.7–7.7)
Neutrophils Relative %: 63 %
Platelets: 105 10*3/uL — ABNORMAL LOW (ref 150–400)
RBC: 4.01 MIL/uL — ABNORMAL LOW (ref 4.22–5.81)
RDW: 14.3 % (ref 11.5–15.5)
WBC: 8.7 10*3/uL (ref 4.0–10.5)

## 2016-01-07 LAB — CBC
HCT: 30.9 % — ABNORMAL LOW (ref 39.0–52.0)
Hemoglobin: 10.9 g/dL — ABNORMAL LOW (ref 13.0–17.0)
MCH: 30.4 pg (ref 26.0–34.0)
MCHC: 35.3 g/dL (ref 30.0–36.0)
MCV: 86.1 fL (ref 78.0–100.0)
Platelets: 115 10*3/uL — ABNORMAL LOW (ref 150–400)
RBC: 3.59 MIL/uL — ABNORMAL LOW (ref 4.22–5.81)
RDW: 14.3 % (ref 11.5–15.5)
WBC: 7.8 10*3/uL (ref 4.0–10.5)

## 2016-01-07 LAB — MRSA PCR SCREENING: MRSA by PCR: NEGATIVE

## 2016-01-07 LAB — CBG MONITORING, ED: Glucose-Capillary: 114 mg/dL — ABNORMAL HIGH (ref 65–99)

## 2016-01-07 LAB — I-STAT CG4 LACTIC ACID, ED
Lactic Acid, Venous: 1.14 mmol/L (ref 0.5–1.9)
Lactic Acid, Venous: 0.76 mmol/L (ref 0.5–1.9)

## 2016-01-07 LAB — GLUCOSE, CAPILLARY: Glucose-Capillary: 107 mg/dL — ABNORMAL HIGH (ref 65–99)

## 2016-01-07 LAB — POC OCCULT BLOOD, ED: Fecal Occult Bld: NEGATIVE

## 2016-01-07 MED ORDER — HEPARIN SODIUM (PORCINE) 5000 UNIT/ML IJ SOLN
5000.0000 [IU] | Freq: Three times a day (TID) | INTRAMUSCULAR | Status: DC
  Administered 2016-01-07 – 2016-01-09 (×5): 5000 [IU] via SUBCUTANEOUS
  Filled 2016-01-07 (×5): qty 1

## 2016-01-07 MED ORDER — SODIUM CHLORIDE 0.9 % IV BOLUS (SEPSIS)
1000.0000 mL | Freq: Once | INTRAVENOUS | Status: AC
  Administered 2016-01-07: 1000 mL via INTRAVENOUS

## 2016-01-07 MED ORDER — ASPIRIN 81 MG PO CHEW
81.0000 mg | CHEWABLE_TABLET | Freq: Every day | ORAL | Status: DC
  Administered 2016-01-08 – 2016-01-09 (×2): 81 mg via ORAL
  Filled 2016-01-07 (×2): qty 1

## 2016-01-07 MED ORDER — POTASSIUM CHLORIDE CRYS ER 20 MEQ PO TBCR
20.0000 meq | EXTENDED_RELEASE_TABLET | Freq: Two times a day (BID) | ORAL | Status: DC
  Administered 2016-01-07: 20 meq via ORAL
  Filled 2016-01-07: qty 1

## 2016-01-07 MED ORDER — VANCOMYCIN HCL 10 G IV SOLR
1750.0000 mg | Freq: Once | INTRAVENOUS | Status: AC
  Administered 2016-01-07: 1750 mg via INTRAVENOUS
  Filled 2016-01-07: qty 1750

## 2016-01-07 MED ORDER — ATORVASTATIN CALCIUM 80 MG PO TABS
80.0000 mg | ORAL_TABLET | Freq: Every day | ORAL | Status: DC
  Administered 2016-01-08 – 2016-01-10 (×3): 80 mg via ORAL
  Filled 2016-01-07 (×3): qty 1

## 2016-01-07 MED ORDER — TICAGRELOR 90 MG PO TABS
90.0000 mg | ORAL_TABLET | Freq: Two times a day (BID) | ORAL | Status: DC
  Administered 2016-01-07 – 2016-01-09 (×4): 90 mg via ORAL
  Filled 2016-01-07 (×4): qty 1

## 2016-01-07 MED ORDER — SODIUM CHLORIDE 0.9% FLUSH
3.0000 mL | Freq: Two times a day (BID) | INTRAVENOUS | Status: DC
  Administered 2016-01-07 – 2016-01-12 (×11): 3 mL via INTRAVENOUS

## 2016-01-07 MED ORDER — PIPERACILLIN-TAZOBACTAM 3.375 G IVPB 30 MIN
3.3750 g | Freq: Once | INTRAVENOUS | Status: AC
  Administered 2016-01-07: 3.375 g via INTRAVENOUS
  Filled 2016-01-07: qty 50

## 2016-01-07 MED ORDER — SODIUM CHLORIDE 0.9 % IV SOLN
Freq: Once | INTRAVENOUS | Status: AC
  Administered 2016-01-07: 18:00:00 via INTRAVENOUS

## 2016-01-07 MED ORDER — STERILE WATER FOR INJECTION IV SOLN
INTRAVENOUS | Status: DC
  Administered 2016-01-07 – 2016-01-08 (×3): via INTRAVENOUS
  Filled 2016-01-07 (×5): qty 850

## 2016-01-07 MED ORDER — NITROGLYCERIN 0.4 MG SL SUBL: 0.4000 mg | SUBLINGUAL_TABLET | SUBLINGUAL | Status: DC | PRN

## 2016-01-07 NOTE — ED Provider Notes (Signed)
[facility] DEPT Provider Note   CSN: [id number] Arrival date & time: 01/07/16  1407     History   Chief Complaint Chief Complaint  Patient presents with  . Weakness  . Dizziness    HPI TERANCE WESTLAND is a 60 y.o. male who  has a past medical history of Anxiety; Arthritis; Dementia; Depression; Diabetes mellitus; Gout; Hypertension; and TIA (transient ischemic attack).  He is s/p And STEMI with cardiac catheterization on 12/18/2015. He and his wife report that he has felt very bad since that time. He has had progressively worsening weakness, loss of appetite, and malaise. He has also had hypotension and had his cardiologist remove 2 of his blood pressure medications. Over the past week he's had several watery stools daily. He states that he does not remember the last time he urinated. He denies any melena or hematochezia.   HPI  Past Medical History:  Diagnosis Date  . Anxiety   . Arthritis   . Dementia   . Depression   . Diabetes mellitus   . Gout   . Hypertension   . TIA (transient ischemic attack)     Patient Active Problem List   Diagnosis Date Noted  . NSTEMI (non-ST elevated myocardial infarction) ([place]) 12/18/2015    Past Surgical History:  Procedure Laterality Date  . CARDIAC CATHETERIZATION N/A 12/18/2015   Procedure: Left Heart Cath and Coronary Angiography;  Surgeon: Adrian Prows, MD;  Location: [facility] [place];  Service: Cardiovascular;  Laterality: N/A;  . CARDIAC CATHETERIZATION N/A 12/18/2015   Procedure: Coronary Stent Intervention;  Surgeon: Adrian Prows, MD;  Location: [facility] [facility];  Service: Cardiovascular;  Laterality: N/A;  . CARDIAC CATHETERIZATION N/A 12/18/2015   Procedure: Coronary Balloon Angioplasty;  Surgeon: Adrian Prows, MD;  Location: [facility];  Service: Cardiovascular;  Laterality: N/A;  . HERNIA REPAIR    . PERIPHERAL VASCULAR CATHETERIZATION N/A 12/18/2015   Procedure: Abdominal Aortogram;  Surgeon: Adrian Prows, MD;  Location:  [facility] [facility];  Service: Cardiovascular;  Laterality: N/A;       Home Medications    Prior to Admission medications   Medication Sig Start Date End Date Taking? Authorizing Provider  amLODipine (NORVASC) 10 MG tablet Take 10 mg by mouth daily. 11/11/15  Yes Historical Provider, MD  aspirin 81 MG chewable tablet Chew 1 tablet (81 mg total) by mouth daily. 12/19/15  Yes Neldon Labella, NP  atorvastatin (LIPITOR) 80 MG tablet Take 1 tablet (80 mg total) by mouth daily at 6 PM. 12/19/15  Yes Neldon Labella, NP  buPROPion (WELLBUTRIN SR) 150 MG 12 hr tablet Take 1 tablet (150 mg total) by mouth 2 (two) times daily. 12/19/15  Yes Neldon Labella, NP  cloNIDine (CATAPRES) 0.2 MG tablet Take 0.2 mg by mouth 2 (two) times daily. 09/11/15  Yes Historical Provider, MD  colchicine 0.6 MG tablet Take 1 tablet (0.6 mg total) by mouth daily. 05/18/11 01/07/16 Yes Tiffany Carlota Raspberry, PA-C  lisinopril-hydrochlorothiazide (PRINZIDE,ZESTORETIC) 20-12.5 MG per tablet Take 1 tablet by mouth daily. 08/04/11  Yes Kingsley Spittle, MD  LORazepam (ATIVAN) 1 MG tablet Take 1 tablet (1 mg total) by mouth every morning. 08/04/11  Yes Kingsley Spittle, MD  metoprolol tartrate (LOPRESSOR) 100 MG tablet Take 1 tablet (100 mg total) by mouth 2 (two) times daily. 12/19/15  Yes Neldon Labella, NP  nitroGLYCERIN (NITROSTAT) 0.4 MG SL tablet Place 0.4 mg under the tongue every 5 (five) minutes as needed. For chest pain   Yes  Historical Provider, MD  QUEtiapine (SEROQUEL XR) 400 MG 24 hr tablet Take 1 tablet (400 mg total) by mouth at bedtime. 08/04/11  Yes Kingsley Spittle, MD  sertraline (ZOLOFT) 100 MG tablet Take 1 tablet (100 mg total) by mouth daily. Patient taking differently: Take 200 mg by mouth daily.  08/04/11  Yes Kingsley Spittle, MD  ticagrelor (BRILINTA) 90 MG TABS tablet Take 1 tablet (90 mg total) by mouth 2 (two) times daily. 12/19/15  Yes Neldon Labella, NP  HYDROcodone-acetaminophen (NORCO/VICODIN) 5-325 MG tablet Take 1  tablet by mouth every 4 (four) hours as needed. Patient not taking: Reported on 01/07/2016 10/13/15   Konrad Felix, PA  predniSONE (DELTASONE) 20 MG tablet Take 2 tablets (40 mg total) by mouth daily. Patient not taking: Reported on 01/07/2016 01/08/15   Waynetta Pean, PA-C    Family History No family history on file.  Social History Social History  Substance Use Topics  . Smoking status: Current Every Day Smoker    Packs/day: 0.50  . Smokeless tobacco: Current User  . Alcohol use No     Comment: former, last 5 yrs     Allergies   Review of patient's allergies indicates no known allergies.   Review of Systems Review of Systems  Ten systems reviewed and are negative for acute change, except as noted in the HPI.   Physical Exam Updated Vital Signs BP 124/65   Pulse 72   Temp (!) 95.5 F (35.3 C) (Rectal)   Resp 22   Ht 5\' 6"  (1.676 m)   Wt 84.4 kg   SpO2 100%   BMI 30.02 kg/m   Physical Exam  Constitutional: He is oriented to person, place, and time.  Weak, ill appearing male  HENT:  Head: Normocephalic and atraumatic.  Eyes: Conjunctivae and EOM are normal. Pupils are equal, round, and reactive to light.  Neck: Normal range of motion. Neck supple. No JVD present.  Cardiovascular: Normal rate and regular rhythm.   Pulmonary/Chest: Effort normal and breath sounds normal. No respiratory distress.  Abdominal: Soft. Bowel sounds are normal. He exhibits no distension.  Musculoskeletal: Normal range of motion.  Neurological: He is alert and oriented to person, place, and time.  Skin:  No pallor in the mucosa. Cool Extremities No mottling of the skin  Nursing note and vitals reviewed.    ED Treatments / Results  Labs (all labs ordered are listed, but only abnormal results are displayed) Labs Reviewed  COMPREHENSIVE METABOLIC PANEL - Abnormal; Notable for the following:       Result Value   Sodium 133 (*)    Potassium 2.3 (*)    Chloride 98 (*)    CO2  15 (*)    Glucose, Bld 122 (*)    BUN 90 (*)    Creatinine, Ser 8.37 (*)    Calcium 8.2 (*)    Albumin 2.6 (*)    AST 73 (*)    ALT 105 (*)    GFR calc non Af Amer 6 (*)    GFR calc Af Amer 7 (*)    Anion gap 20 (*)    All other components within normal limits  CBC WITH DIFFERENTIAL/PLATELET - Abnormal; Notable for the following:    RBC 4.01 (*)    Hemoglobin 12.3 (*)    HCT 34.8 (*)    Platelets 105 (*)    All other components within normal limits  CBG MONITORING, ED - Abnormal; Notable for the following:  Glucose-Capillary 114 (*)    All other components within normal limits  CULTURE, BLOOD (ROUTINE X 2)  CULTURE, BLOOD (ROUTINE X 2)  URINE CULTURE  URINALYSIS, ROUTINE W REFLEX MICROSCOPIC (NOT AT [facility])  I-STAT CG4 LACTIC ACID, ED  POC OCCULT BLOOD, ED    EKG  EKG Interpretation None       Radiology Dg Chest Port 1 View  Result Date: 01/07/2016 CLINICAL DATA:  Hypotension and lightheadedness. EXAM: PORTABLE CHEST 1 VIEW COMPARISON:  12/18/2015 FINDINGS: Mild bibasilar atelectasis. No acute consolidation or effusion. Stable borderline cardiomegaly. Pulmonary vascularity within normal limits. No pneumothorax. IMPRESSION: Minimal bibasilar atelectasis Electronically Signed   By: Donavan Foil M.D.   On: 01/07/2016 15:20    Procedures .Critical Care Performed by: Margarita Mail Authorized by: Margarita Mail   Critical care provider statement:    Critical care time (minutes):  50   Critical care time was exclusive of:  Separately billable procedures and treating other patients   Critical care was necessary to treat or prevent imminent or life-threatening deterioration of the following conditions:  Renal failure   Critical care was time spent personally by me on the following activities:  Development of treatment plan with patient or surrogate, discussions with consultants, evaluation of patient's response to treatment, examination of patient, interpretation of  cardiac output measurements, obtaining history from patient or surrogate, review of old charts, re-evaluation of patient's condition, pulse oximetry, ordering and review of radiographic studies, ordering and review of laboratory studies and ordering and performing treatments and interventions   (including critical care time)  Medications Ordered in ED Medications  vancomycin (VANCOCIN) 1,750 mg in sodium chloride 0.9 % 500 mL IVPB (not administered)  piperacillin-tazobactam (ZOSYN) IVPB 3.375 g (3.375 g Intravenous New Bag/Given 01/07/16 1536)  sodium chloride 0.9 % bolus 1,000 mL (0 mLs Intravenous Stopped 01/07/16 1517)    And  sodium chloride 0.9 % bolus 1,000 mL (0 mLs Intravenous Stopped 01/07/16 1517)    And  sodium chloride 0.9 % bolus 1,000 mL (1,000 mLs Intravenous New Bag/Given 01/07/16 1518)     Initial Impression / Assessment and Plan / ED Course  I have reviewed the triage vital signs and the nursing notes.  Pertinent labs & imaging results that were available during my care of the patient were reviewed by me and considered in my medical decision making (see chart for details).  Clinical Course    Patient found to be in acute renal failure with hypokalemia. Potassium is not repleted secondary to her in the area. He has an anion gap metabolic acidosis which is most likely uremic considering his lactic acid is normal. The patient is hypotensive with hypothermia is treated for presumed sepsis with vancomycin and Zosyn. Patient given 3 L of fluid with improvement in his blood pressures. I have spoken with Dr. Bonner Puna who accepted the patient to the stepdown unit. I spoken with Dr. Mercy Moore of nephrology, who will consult on the patient. Patient appears to be improving throughout his ED course here in the emergency department. Explained the diagnosis to the patient who understands and agrees with plan of care Patient seen in shared visit with attending physician.  Final Clinical  Impressions(s) / ED Diagnoses   Final diagnoses:  Acute renal failure, unspecified acute renal failure type (HCC)  Uremic acidosis  Hypokalemia  Anuria  Hypotension, unspecified hypotension type  Hypothermia, initial encounter    New Prescriptions New Prescriptions   No medications on file     Natividad Schlosser  Kenton Kingfisher, PA-C 01/07/16 1653    Tanna Furry, MD 01/16/16 1019

## 2016-01-07 NOTE — ED Notes (Signed)
Nurse currently getting 2nd set of blood cultures.

## 2016-01-07 NOTE — ED Triage Notes (Signed)
Patient comes in with c/o dizziness and weakness for past 2 weeks. Had stents x2 placed post MI recently. Has had decreased appetite and loss of weight. Patient states he's been having diarrhea x1 week. BP is low. EMS 74/42 post liter bolus en route. HR in 50s, 97% on RA, 16 RR. EMS EKG unremarkable. Denies chest pain. fsbs 132.

## 2016-01-07 NOTE — ED Notes (Signed)
Critical lab:  2.3 Potassium  Dr. Jeneen Rinks made aware.

## 2016-01-07 NOTE — Consult Note (Signed)
[name] is an 60 y.o. male referred by Dr Bonner Puna   Chief Complaint: ARF, metabolic acidosis, hypokalemia HPI: 59yo WM presents to ER feeling weak x 10d or so with little UO x 1 week.  Wife finally convinced him to come to ER.  On 12/18/15 he had abdominal aortogram amd LHC with stent placement.  Renal fx Nl day of and day after procedure (1.08) but now Scr 8.37.  Has been on lisinopril, amlodipine and metoprolol and SBP in the 80's by home BP monitoring.  Long hx HTN, poorly controlled with Rt renal artery stent the was placed in the past.  Overall pt not a good historian.  Past Medical History:  Diagnosis Date  . Anxiety   . Arthritis   . Dementia   . Depression   . Diabetes mellitus   . Gout   . Hypertension   . TIA (transient ischemic attack)     Past Surgical History:  Procedure Laterality Date  . CARDIAC CATHETERIZATION N/A 12/18/2015   Procedure: Left Heart Cath and Coronary Angiography;  Surgeon: Adrian Prows, MD;  Location: [place] [facility];  Service: Cardiovascular;  Laterality: N/A;  . CARDIAC CATHETERIZATION N/A 12/18/2015   Procedure: Coronary Stent Intervention;  Surgeon: Adrian Prows, MD;  Location: [place] [facility];  Service: Cardiovascular;  Laterality: N/A;  . CARDIAC CATHETERIZATION N/A 12/18/2015   Procedure: Coronary Balloon Angioplasty;  Surgeon: Adrian Prows, MD;  Location: [place];  Service: Cardiovascular;  Laterality: N/A;  . HERNIA REPAIR    . PERIPHERAL VASCULAR CATHETERIZATION N/A 12/18/2015   Procedure: Abdominal Aortogram;  Surgeon: Adrian Prows, MD;  Location: [place] [facility];  Service: Cardiovascular;  Laterality: N/A;   Son has IgA Nephropathy but no FH of ESRD  No family history on file. Social History:  reports that he has been smoking.  He has been smoking about 0.50 packs per day. He uses smokeless tobacco. He reports that he does not drink alcohol or use drugs.Married, lives with wife, 3 kids.  On disability  Allergies: No Known  Allergies   (Not in a hospital admission)   Lab Results: UA: ND  Recent Labs  01/07/16 1415  WBC 8.7  HGB 12.3*  HCT 34.8*  PLT 105*   BMET  Recent Labs  01/07/16 1415  NA 133*  K 2.3*  CL 98*  CO2 15*  GLUCOSE 122*  BUN 90*  CREATININE 8.37*  CALCIUM 8.2*   LFT  Recent Labs  01/07/16 1415  PROT 6.7  ALBUMIN 2.6*  AST 73*  ALT 105*  ALKPHOS 79  BILITOT 0.3   Dg Chest Port 1 View  Result Date: 01/07/2016 CLINICAL DATA:  Hypotension and lightheadedness. EXAM: PORTABLE CHEST 1 VIEW COMPARISON:  12/18/2015 FINDINGS: Mild bibasilar atelectasis. No acute consolidation or effusion. Stable borderline cardiomegaly. Pulmonary vascularity within normal limits. No pneumothorax. IMPRESSION: Minimal bibasilar atelectasis Electronically Signed   By: Donavan Foil M.D.   On: 01/07/2016 15:20    ROS: No change in vision No SOB No CP No abd pain Appetite poor + nausea, no vomiting No neuropathic sxs   PHYSICAL EXAM: Blood pressure 122/72, pulse 71, temperature (!) 95.5 F (35.3 C), temperature source Rectal, resp. rate 19, height _0  (1.676 m), weight 84.4 kg (186 lb), SpO2 100 %. HEENT: PERRLA EOMI NECK:No JVD LUNGS:Clear CARDIAC:RRR wo MRG ABD:+ BS NTND No HSM EXT:No edema NEURO:CNI Ox3 No asterixis Skin: No evidence of cholesterol emboli  Assessment: 1. ARF presumably  due to ATN from hypotension +/- contrast. 2. Hypokalemia 3. Met acidosis 4. Hx HTN but BP has been low PLAN: 1. IV isotonic bicarb 2. Hold all BP meds 3. Replace K cautiously 4. Daily labs 5. Discussed the role of HD if renal fx does not improve 6. Check UA 7. Foley cath   Marlee Trentman T 01/07/2016, 4:36 PM

## 2016-01-07 NOTE — Consult Note (Signed)
CARDIOLOGY CONSULT NOTE  Patient ID: JESSICA SEIDMAN MRN: [id number] DOB/AGE: 1955/10/13 60 y.o.  Admit date: 01/07/2016 Referring Physician  Dr. Vance Gather Primary Physician:  Ricke Hey, MD Reason for Consultation  CAD, hypotension  HPI: CRIAG WICKLUND  is a 60 y.o. 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  He presented to his PCP on the morning of 12/18/2015 with chest pain and NSTEMI, underwent coronary angiography the following morning revealing high-grade proximal LAD stenosis for which he underwent successful angioplasty. 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 His blood pressure has been high and uncontrolled. He had been compliant with his medications.  He was discharged home on 12/19/2015, states that he felt well until 4-5 days ago he started having frequent diarrhea. He had called our office yesterday stating his blood pressure was very low, we have advised him to hold his metoprolol and advised him to come into the office this morning. He did not mention any GI issues. He was now admitted through the emergency room with altered mental status, hypotension and hypothermia and acute renal failure. I am asked to see the patient for management of his cardiac issues.  Since being evaluated in the emergency room and has been resuscitated with fluids, warm blankets placed, patient is alert and oriented 3, gives me all the history appropriately. He states that he is feeling well now but still feels slightly weak. He states that he does not seem to remember the last few days. He has quit smoking since recent hospital discharge. He has not had any chest pain or shortness of breath.  Past Medical History:  Diagnosis Date  . Anxiety   . Arthritis   . Dementia   . Depression   . Diabetes mellitus    . Gout   . Hypertension   . TIA (transient ischemic attack)      Past Surgical History:  Procedure Laterality Date  . CARDIAC CATHETERIZATION N/A 12/18/2015   Procedure: Left Heart Cath and Coronary Angiography;  Surgeon: Adrian Prows, MD;  Location: [place] [facility];  Service: Cardiovascular;  Laterality: N/A;  . CARDIAC CATHETERIZATION N/A 12/18/2015   Procedure: Coronary Stent Intervention;  Surgeon: Adrian Prows, MD;  Location: [place] [facility];  Service: Cardiovascular;  Laterality: N/A;  . CARDIAC CATHETERIZATION N/A 12/18/2015   Procedure: Coronary Balloon Angioplasty;  Surgeon: Adrian Prows, MD;  Location: [place] [facility];  Service: Cardiovascular;  Laterality: N/A;  . HERNIA REPAIR    . PERIPHERAL VASCULAR CATHETERIZATION N/A 12/18/2015   Procedure: Abdominal Aortogram;  Surgeon: Adrian Prows, MD;  Location: [place] [facility];  Service: Cardiovascular;  Laterality: N/A;     Family History  Problem Relation Age of Onset  . Heart attack Mother   . Heart attack Father   . IgA nephropathy Son      Social History: Social History   Social History  . Marital status: Legally Separated    Spouse name: N/A  . Number of children: N/A  . Years of education: N/A   Occupational History  . Not on file.   Social History Main Topics  . Smoking status: Current Every Day Smoker    Packs/day: 0.50  . Smokeless tobacco: Current User  . Alcohol use No     Comment: former, last 5 yrs  . Drug use: No  .  Sexual activity: Not on file   Other Topics Concern  . Not on file   Social History Narrative  . No narrative on file    ROS: General: Generalized weakness present Eyes:Has blurry vision ENT: no sore throat or hearing loss Resp: no cough, wheezing, or hemoptysis CV: no edema or palpitations GI:  diarrhea GU: no dysuria, frequency, or hematuria Skin: no rash Neuro: no headache, numbness, tingling, or weakness of extremities Musculoskeletal: no joint pain or swelling Heme: no  bleeding, DVT, or easy bruising Endo: no polydipsia or polyuria  Physical Exam: Blood pressure 125/69, pulse 73, temperature (!) 96.8 F (36 C), temperature source Rectal, resp. rate (!) 36, height _0  (1.676 m), weight 84.4 kg (186 lb), SpO2 99 %.   General appearance: alert, cooperative, appears older than stated age and no distress Lungs: clear to auscultation bilaterally Chest wall: no tenderness Heart: regular rate and rhythm, S1, S2 normal, no murmur, click, rub or gallop Abdomen: soft, non-tender; bowel sounds normal; no masses,  no organomegaly Extremities: extremities normal, atraumatic, no cyanosis or edema Pulses: Bilateral femorals normal, decreased pedal pulses bilateral. Popliteal pulse could not be felt due to bodily habitus. Bilateral carotids normal. Neurologic: Grossly normal  Labs:   Lab Results  Component Value Date   WBC 8.7 01/07/2016   HGB 12.3 (L) 01/07/2016   HCT 34.8 (L) 01/07/2016   MCV 86.8 01/07/2016   PLT 105 (L) 01/07/2016    Recent Labs Lab 01/07/16 1415  NA 133*  K 2.3*  CL 98*  CO2 15*  BUN 90*  CREATININE 8.37*  CALCIUM 8.2*  PROT 6.7  BILITOT 0.3  ALKPHOS 79  ALT 105*  AST 73*  GLUCOSE 122*    Lipid Panel     Component Value Date/Time   CHOL 88 12/18/2015 1525   TRIG 127 12/18/2015 1525   HDL 17 (L) 12/18/2015 1525   CHOLHDL 5.2 12/18/2015 1525   VLDL 25 12/18/2015 1525   LDLCALC 46 12/18/2015 1525   EKG 01/07/2016: Normal sinus rhythm at the rate of 58 beats a minute, normal axis. Inferior and lateral ST-T wave changes, cannot exclude ischemia. Compared to the study done on 12/19/2015, lateral deep T-wave inversion now appears much improved..  Coronary angiogram 12/18/2015: Successful PTCA and stenting of the proximal and mid LAD with 2 overlapping 4.0 x 22 and 3.5 x 12 mm resolute integrity DES, stenosis reduced from 99% to 0%. PTCA and balloon angioplasty of the distal PDA with a 2.5 x 15 mm Emerge with reduction of  stenosis from 90% to less than 30%. 1. Severe diffuse coronary artery disease, scattered disease evident throughout the RCA, PDA has tandem 60% and a 90% stenosis. Distal lesion reduced to less than 30% with balloon angioplasty. 2. High-grade complex 99% stenosis of the proximal LAD. Successful angioplasty as noted above. 3. Mild disease in the circumflex with intermediate lesions 60-70% in OM 3 and OM 4 and distal circumflex AV groove branch. 4. Abdominal aortogram: No evidence of abdominal aortic aneurysm. There are 2 renal arteries on the left, one renal artery on the right. A renal artery stent is evident on the right, renal artery is visible and patent, in-stent restenosis is present could not be evaluated due to nonselective nature of abdominal aortogram. Consider other modalities to evaluate renal artery stenosis, selective renal arteriogram was not performed as patient was not consented and also procedure performed through radial access.   Radiology: Dg Chest Port 1 View  Result Date:  01/07/2016 CLINICAL DATA:  Hypotension and lightheadedness. EXAM: PORTABLE CHEST 1 VIEW COMPARISON:  12/18/2015 FINDINGS: Mild bibasilar atelectasis. No acute consolidation or effusion. Stable borderline cardiomegaly. Pulmonary vascularity within normal limits. No pneumothorax. IMPRESSION: Minimal bibasilar atelectasis Electronically Signed   By: Donavan Foil M.D.   On: 01/07/2016 15:20    Scheduled Meds: . [START ON 01/08/2016] aspirin  81 mg Oral Daily  . [START ON 01/08/2016] atorvastatin  80 mg Oral q1800  . heparin  5,000 Units Subcutaneous Q8H  . potassium chloride  20 mEq Oral BID  . sodium chloride flush  3 mL Intravenous Q12H  . ticagrelor  90 mg Oral BID   Continuous Infusions: .  sodium bicarbonate 150 mEq in sterile water 1000 mL infusion 100 mL/hr at 01/07/16 1935   PRN Meds:.nitroGLYCERIN  ASSESSMENT AND PLAN:  1. Acute renal failure due to severe dehydration from diarrhea 2. CAD s/p  PTCA and stenting of the proximal and mid LAD with 2 overlapping 4.0 x 22 and 3.5 x 12 mm resolute integrity DES, stenosis reduced from 99% to 0%, and PTCA and balloon angioplasty of the distal PDA with a 2.5 x 15 mm Emerge with reduction of stenosis from 90% to less than 30%. 3. HTN, now hypotensive 4. Low HDL at 17 5. Tobacco use disorder, quit cigarettes recent hospitalization on  12/18/2015. 6. Family history of premature CAD in his father who died at age 73 from a heart attack and in his mother died at age 58 from a heart attack, 1st CABG in her 57s.  Recommendation: Patient already much improved since admission to the hospital and hydration. Hopefully his renal function will return to normal. No choice but to hold off on any cardiac medications for now until he is stable hemodynamically. This includes beta blockers, calcium channel blocker and obviously ACE inhibitors.  Please resume dual antiplatelet therapy including aspirin and Brilinta. Otherwise no specific recommendations from cardiac standpoint. He met his wife and answered all questions.   Adrian Prows, MD 01/07/2016, 7:58 PM [facility] [place] Pager: [phone] Office: [phone] If no answer Cell [phone]

## 2016-01-07 NOTE — ED Provider Notes (Addendum)
Patient seen and examined. Discussed with PA Margarita Mail. Patient with recent coronary bypass grafting. Has not felt well since. Said low blood pressures. Frequent diarrhea. No episodes of diarrhea per day. Presents here orthostatic and lightheaded feeling weak. Tachycardic. He is in acute renal failure. His given fluids. He is hypothermic. Cultures being obtained and given IV fluids.  He is an earache. I think his symptoms are likely multifactorial and prerenal and medication related.   However will need to cover for sepsis with hypothermia. Plan is IV fluids, antibiotics. Admission.   CRITICAL CARE Performed by: Tanna Furry JOSEPH   Total critical care time: 30 minutes  Critical care time was exclusive of separately billable procedures and treating other patients.  Critical care was necessary to treat or prevent imminent or life-threatening deterioration.  Critical care was time spent personally by me on the following activities: development of treatment plan with patient and/or surrogate as well as nursing, discussions with consultants, evaluation of patient's response to treatment, examination of patient, obtaining history from patient or surrogate, ordering and performing treatments and interventions, ordering and review of laboratory studies, ordering and review of radiographic studies, pulse oximetry and re-evaluation of patient's condition.   Tanna Furry, MD 01/07/16 [place], MD 01/07/16 [phone]

## 2016-01-07 NOTE — ED Notes (Signed)
Second set of bld cx obtained while abx is infusing

## 2016-01-07 NOTE — H&P (Signed)
History and Physical   [name] [id number] DOB: 23-Oct-1955 DOA: 01/07/2016  Referring MD/NP/PA: Tanna Furry, MD - EDP PCP: Ricke Hey, MD Outpatient Specialists: Dr. Einar Gip, cardiology  Patient coming from: Home  Chief Complaint: weakness  HPI: [name] is a 60 y.o. male with a history of NSTEMI s/p PCI recently discharged Sept 2017, DM, HTN, and TIA brought by EMS for weakness and diarrhea.   He has felt generally unwell since discharge, weak, staying in bed most of the time, able to walk only with significant assistance. Per his wife, Hassan Rowan, he's eaten 2 bowls of soup in the past 2 weeks and he can't remember the last time he urinated (?several days perhaps). BP checks at home have been consistently low, recalled as 100/60's prior to BP meds and 70/40's afterward with worsening weakness and light-headedness on standing. This was reported to his physician who continued to decrease antihypertensives, but this morning BP was 50s/40s so he called EMS. He also endorses 1 week of loose stools daily, remaining constant without blood/melena, abd pain, fever, N/V.   ED Course: EMS reported BP 74/42, HR 50's, 97% on RA, and gave a NS bolus en route. On arrival he was ill appearing with cool extremities and rectal temperature of 95.89F. Creatinine was 8.37 (from recent 1.09) with an anion gap metabolic acidosis and normal lactate. WBC 8.7. K 2.3. BP's improved with 3L NS boluses to 112/67, HR 76. Vanc and zosyn were administered for concern of sepsis with hypothermia. UA was not overtly infected. CXR without infiltrate. Cultures of blood and urine was obtained and [facility] called to admit. Nephrology consultation was recommended, called by EDP.   Review of Systems: He is certain he has had no abdominal pain or blood in stool, and per HPI. All others reviewed and are negative.   Past Medical History:  Diagnosis Date  . Anxiety   . Arthritis   . Dementia   . Depression   . Diabetes  mellitus   . Gout   . Hypertension   . TIA (transient ischemic attack)     Past Surgical History:  Procedure Laterality Date  . CARDIAC CATHETERIZATION N/A 12/18/2015   Procedure: Left Heart Cath and Coronary Angiography;  Surgeon: Adrian Prows, MD;  Location: [facility];  Service: Cardiovascular;  Laterality: N/A;  . CARDIAC CATHETERIZATION N/A 12/18/2015   Procedure: Coronary Stent Intervention;  Surgeon: Adrian Prows, MD;  Location: [facility];  Service: Cardiovascular;  Laterality: N/A;  . CARDIAC CATHETERIZATION N/A 12/18/2015   Procedure: Coronary Balloon Angioplasty;  Surgeon: Adrian Prows, MD;  Location: [facility];  Service: Cardiovascular;  Laterality: N/A;  . HERNIA REPAIR    . PERIPHERAL VASCULAR CATHETERIZATION N/A 12/18/2015   Procedure: Abdominal Aortogram;  Surgeon: Adrian Prows, MD;  Location: [facility];  Service: Cardiovascular;  Laterality: N/A;   Former smoker, quit 2 weeks ago after heart attack. Denies illicit drugs or drinking alcohol.  No Known Allergies  Father died of MI at 74yrs. Mother died 31yrs with MI.  - Family history otherwise reviewed and not pertinent.  Prior to Admission medications   Medication Sig Start Date End Date Taking? Authorizing Provider  aspirin 81 MG chewable tablet Chew 1 tablet (81 mg total) by mouth daily. 12/19/15  Yes Neldon Labella, NP  atorvastatin (LIPITOR) 80 MG tablet Take 1 tablet (80 mg total) by mouth daily at 6 PM. 12/19/15  Yes Neldon Labella, NP  [facility] SR)  150 MG 12 hr tablet Take 1 tablet (150 mg total) by mouth 2 (two) times daily. 12/19/15  Yes Neldon Labella, NP  cloNIDine (CATAPRES) 0.2 MG tablet Take 0.2 mg by mouth 2 (two) times daily. 09/11/15  Yes Historical Provider, MD  colchicine 0.6 MG tablet Take 1 tablet (0.6 mg total) by mouth daily. 05/18/11 01/07/16 Yes Tiffany Carlota Raspberry, PA-C  indomethacin (INDOCIN) 50 MG capsule Take 50 mg by mouth daily. 12/16/15  Yes Historical  Provider, MD  lisinopril-hydrochlorothiazide (PRINZIDE,ZESTORETIC) 20-12.5 MG per tablet Take 1 tablet by mouth daily. 08/04/11  Yes Kingsley Spittle, MD  LORazepam (ATIVAN) 1 MG tablet Take 1 tablet (1 mg total) by mouth every morning. 08/04/11  Yes Kingsley Spittle, MD  metoprolol tartrate (LOPRESSOR) 100 MG tablet Take 1 tablet (100 mg total) by mouth 2 (two) times daily. 12/19/15  Yes Neldon Labella, NP  nitroGLYCERIN (NITROSTAT) 0.4 MG SL tablet Place 0.4 mg under the tongue every 5 (five) minutes as needed. For chest pain   Yes Historical Provider, MD  QUEtiapine (SEROQUEL XR) 400 MG 24 hr tablet Take 1 tablet (400 mg total) by mouth at bedtime. 08/04/11  Yes Kingsley Spittle, MD  sertraline (ZOLOFT) 100 MG tablet Take 1 tablet (100 mg total) by mouth daily. Patient taking differently: Take 200 mg by mouth daily.  08/04/11  Yes Kingsley Spittle, MD  ticagrelor (BRILINTA) 90 MG TABS tablet Take 1 tablet (90 mg total) by mouth 2 (two) times daily. 12/19/15  Yes Neldon Labella, NP  HYDROcodone-acetaminophen (NORCO/VICODIN) 5-325 MG tablet Take 1 tablet by mouth every 4 (four) hours as needed. Patient not taking: Reported on 01/07/2016 10/13/15   Konrad Felix, PA  predniSONE (DELTASONE) 20 MG tablet Take 2 tablets (40 mg total) by mouth daily. Patient not taking: Reported on 01/07/2016 01/08/15   Waynetta Pean, PA-C    Physical Exam: Vitals:   01/07/16 1645 01/07/16 1700 01/07/16 1703 01/07/16 1730  BP: 124/70 117/68  122/67  Pulse: 72 74  74  Resp: 24 (!) 28  (!) 30  Temp:   (!) 95.2 F (35.1 C)   TempSrc:   Rectal   SpO2: 99% 98%  96%  Weight:      Height:       Constitutional: Ill 60 y.o. male in no distress Eyes: Lids and conjunctivae normal, PERRL ENMT: Mucous membranes are tacky. Posterior pharynx clear of any exudate or lesions. Fair dentition.  Neck: normal, supple, no masses, no thyromegaly Respiratory: Non-labored breathing without accessory muscle use. Clear breath sounds to auscultation  bilaterally Cardiovascular: Regular rate and rhythm, no murmurs, rubs, or gallops. No JVD. No LE edema. 1+ bilateral pedal pulses. Cap refill in toes and finger < 3 sec. Abdomen: Hypoactive bowel sounds. No tenderness, non-distended, and no masses palpated. No hepatosplenomegaly. GU: No indwelling catheter Musculoskeletal: No clubbing / cyanosis. No joint deformity upper and lower extremities. Good ROM, no contractures. Normal muscle tone.  Skin: Extremities warm, dry. No rashes, wounds, No ulcers. No significant lesions noted.  Neurologic: CN II-XII grossly intact. Gait not assessed due to diffuse weakness. Speech normal. No focal deficits in motor strength or sensation in all extremities.  Psychiatric: Alert and oriented x3. Normal judgment and insight. Flat affect.   Labs on Admission: I have personally reviewed following labs and imaging studies  CBC:  Recent Labs Lab 01/07/16 1415  WBC 8.7  NEUTROABS 5.5  HGB 12.3*  HCT 34.8*  MCV 86.8  PLT 123456*   Basic Metabolic Panel:  Recent Labs Lab 01/07/16 1415  NA 133*  K 2.3*  CL 98*  CO2 15*  GLUCOSE 122*  BUN 90*  CREATININE 8.37*  CALCIUM 8.2*   GFR: Estimated Creatinine Clearance: 9.6 mL/min (by C-G formula based on SCr of 8.37 mg/dL (H)). Liver Function Tests:  Recent Labs Lab 01/07/16 1415  AST 73*  ALT 105*  ALKPHOS 79  BILITOT 0.3  PROT 6.7  ALBUMIN 2.6*   No results for input(s): LIPASE, AMYLASE in the last 168 hours. No results for input(s): AMMONIA in the last 168 hours. Coagulation Profile: No results for input(s): INR, PROTIME in the last 168 hours. Cardiac Enzymes: No results for input(s): CKTOTAL, CKMB, CKMBINDEX, TROPONINI in the last 168 hours. BNP (last 3 results) No results for input(s): PROBNP in the last 8760 hours. HbA1C: No results for input(s): HGBA1C in the last 72 hours. CBG:  Recent Labs Lab 01/07/16 1459  GLUCAP 114*   Lipid Profile: No results for input(s): CHOL, HDL,  LDLCALC, TRIG, CHOLHDL, LDLDIRECT in the last 72 hours. Thyroid Function Tests: No results for input(s): TSH, T4TOTAL, FREET4, T3FREE, THYROIDAB in the last 72 hours. Anemia Panel: No results for input(s): VITAMINB12, FOLATE, FERRITIN, TIBC, IRON, RETICCTPCT in the last 72 hours. Urine analysis:    Component Value Date/Time   COLORURINE AMBER (A) 01/07/2016 1644   APPEARANCEUR CLOUDY (A) 01/07/2016 1644   LABSPEC 1.016 01/07/2016 1644   PHURINE 5.0 01/07/2016 1644   GLUCOSEU NEGATIVE 01/07/2016 1644   HGBUR MODERATE (A) 01/07/2016 1644   BILIRUBINUR SMALL (A) 01/07/2016 1644   KETONESUR NEGATIVE 01/07/2016 1644   PROTEINUR 30 (A) 01/07/2016 1644   UROBILINOGEN 0.2 05/01/2008 1735   NITRITE NEGATIVE 01/07/2016 1644   LEUKOCYTESUR TRACE (A) 01/07/2016 1644   Sepsis Labs: @LABRCNTIP (procalcitonin:4,lacticidven:4) )No results found for this or any previous visit (from the past 240 hour(s)).   Radiological Exams on Admission: Dg Chest Port 1 View  Result Date: 01/07/2016 CLINICAL DATA:  Hypotension and lightheadedness. EXAM: PORTABLE CHEST 1 VIEW COMPARISON:  12/18/2015 FINDINGS: Mild bibasilar atelectasis. No acute consolidation or effusion. Stable borderline cardiomegaly. Pulmonary vascularity within normal limits. No pneumothorax. IMPRESSION: Minimal bibasilar atelectasis Electronically Signed   By: Donavan Foil M.D.   On: 01/07/2016 15:20    EKG: Independently reviewed. Sinus bradycardia with vent rate 58bpm. QRS narrow, no PVCs or U waves. Diffuse t wave flattening without ST elevation or depression. WTc 469msec  Assessment/Plan Active Problems:   Acute renal failure (ARF) (HCC)   Acute renal failure: Cr >8 from baseline 1.1-1.2. Prerenal insult due to profound hypoperfusion from hypovolemia and antihypertensives. Also worsened by ACE inhibitor/HCTZ. No indication for urgent HD, but pt aware this may be required if no renal recovery. - Nephrology consulted - Foley - UA and  culture - Monitor chemistries and UOP, I/O - Admit to SDU for monitoring  Hypotension: Due to hypovolemia, responsive to fluids. Due to GI losses. With hypoperfusion and diarrhea, concerned for ischemic colitis but no abd pain and no blood in stool. With hypothermia, given broad-spectrum abx in ED, though lactate normal and doubt sepsis. - s/p 3L NS. Continue hydration with isotonic bicarb for significant volume deficit.  - Monitor chemistry panel - Check TSH and cortisol in AM - Follow cultures, consider abx if deteriorates  Diarrhea: FOBT neg. No abd pain or emesis to indicate gastroenteritis - GIPP, C. diff  Hypokalemia: Due to virtually no po intake, GI losses from diarrhea, and hypothermia. ECG changes confined to QTc of 432msec.  No ventricular ectopy or U waves noted. T waves are diffusely flattened compared to ECG 9/23. - Replete cautiously - Check Mg - Telemetry  History of NSTEMI/CAD s/p PTCA and stenting of proximal/mid LAD w/DES x2: Last month by Dr. Einar Gip - I've notified Dr. Einar Gip of admission and he will see Mr. Sarma - Continue DAPT - NTG SL prn chest pain only.   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 - Hold hypertensives  Tobacco use disorder: Quit x2 weeks, placed on zyban at recent discharge - Hold zyban  Dyslipidemia: HDL 17 - Continue statin  Depression/anxiety:  - Hold sedating medications  DVT prophylaxis: Subcutaneous heparin  Code Status: Full confirmed at admission  Family Communication: Wife at the bedside Disposition Plan: Admit to SDU Consults called: Nephrology, Dr. Mercy Moore and Cardiology, Dr. Einar Gip  Admission status: Inpatient.    Vance Gather, MD [facility] Pager [phone]  If 7PM-7AM, please contact night-coverage [email or web address] Password [email or web address] 01/07/2016, 6:59 PM

## 2016-01-08 ENCOUNTER — Inpatient Hospital Stay (HOSPITAL_COMMUNITY): Payer: Medicaid Other

## 2016-01-08 DIAGNOSIS — N170 Acute kidney failure with tubular necrosis: Principal | ICD-10-CM

## 2016-01-08 DIAGNOSIS — R34 Anuria and oliguria: Secondary | ICD-10-CM

## 2016-01-08 LAB — GASTROINTESTINAL PANEL BY PCR, STOOL (REPLACES STOOL CULTURE)

## 2016-01-08 LAB — C DIFFICILE QUICK SCREEN W PCR REFLEX
C Diff antigen: NEGATIVE
C Diff interpretation: NOT DETECTED
C Diff toxin: NEGATIVE

## 2016-01-08 LAB — RENAL FUNCTION PANEL
Albumin: 2.4 g/dL — ABNORMAL LOW (ref 3.5–5.0)
Anion gap: 12 (ref 5–15)
BUN: 74 mg/dL — ABNORMAL HIGH (ref 6–20)
CO2: 20 mmol/L — ABNORMAL LOW (ref 22–32)
Calcium: 7.6 mg/dL — ABNORMAL LOW (ref 8.9–10.3)
Chloride: 106 mmol/L (ref 101–111)
Creatinine, Ser: 4.32 mg/dL — ABNORMAL HIGH (ref 0.61–1.24)
GFR calc Af Amer: 16 mL/min — ABNORMAL LOW (ref >60)
GFR calc non Af Amer: 14 mL/min — ABNORMAL LOW (ref >60)
Glucose, Bld: 96 mg/dL (ref 65–99)
Phosphorus: 3.3 mg/dL (ref 2.5–4.6)
Potassium: 2.3 mmol/L — CL (ref 3.5–5.1)
Sodium: 138 mmol/L (ref 135–145)

## 2016-01-08 LAB — TSH: TSH: 0.354 u[IU]/mL (ref 0.350–4.500)

## 2016-01-08 LAB — URINE CULTURE: Culture: NO GROWTH

## 2016-01-08 LAB — MAGNESIUM: Magnesium: 1.9 mg/dL (ref 1.7–2.4)

## 2016-01-08 LAB — CORTISOL-AM, BLOOD: Cortisol - AM: 21.5 ug/dL (ref 6.7–22.6)

## 2016-01-08 MED ORDER — ZOLPIDEM TARTRATE 5 MG PO TABS
5.0000 mg | ORAL_TABLET | Freq: Once | ORAL | Status: AC
  Administered 2016-01-08: 5 mg via ORAL
  Filled 2016-01-08: qty 1

## 2016-01-08 MED ORDER — POTASSIUM CHLORIDE CRYS ER 20 MEQ PO TBCR
40.0000 meq | EXTENDED_RELEASE_TABLET | Freq: Three times a day (TID) | ORAL | Status: DC
  Administered 2016-01-08 – 2016-01-09 (×4): 40 meq via ORAL
  Filled 2016-01-08 (×4): qty 2

## 2016-01-08 MED ORDER — BOOST / RESOURCE BREEZE PO LIQD
1.0000 | Freq: Every day | ORAL | Status: DC
  Administered 2016-01-08 – 2016-01-12 (×5): 1 via ORAL

## 2016-01-08 NOTE — Progress Notes (Signed)
[facility] PROGRESS NOTE  [name] [id number] DOB: 10-Mar-1956 DOA: 01/07/2016   PCP: Ricke Hey, MD     Assessment/Plan: Principal Problem:   Acute renal failure (ARF) ([place]) Active Problems:   Hypokalemia   Hypotension   Hypothermia   Renovascular hypertension   History of non-ST elevation myocardial infarction (NSTEMI)   Tobacco use   Diarrhea   Dyslipidemia   Anuria    [name]  is a 60 y.o. 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 He presented to his PCP on the morning of 12/18/2015 with chest pain and NSTEMI, underwent coronary angiography the following morning revealing high-grade proximal LAD stenosis for which he underwent successful angioplasty. 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 His blood pressure has been high and uncontrolled. He had been compliant with his medications.  He was discharged home on 12/19/2015, states that he felt well until 4-5 days ago he started having frequent diarrhea.EMS reported BP 74/42, HR 50's, 97% on RA, and gave a NS bolus en route.rectal temperature of 95.14F. Creatinine was 8.37 (from recent 1.09) with an anion gap metabolic acidosis and normal lactate. WBC 8.7. K 2.3  Assessment and plan  Acute renal failure: Cr >8 from baseline 1.1-1.2. Prerenal insult due to profound hypoperfusion from hypovolemia and antihypertensives. Also worsened by ACE inhibitor/HCTZ. No indication for urgent HD, but pt aware this may be required if no renal recovery. - Nephrology consulted, patient has been on an ACE inhibitor, had an angiogram on 12/18/15. Preprocedure creatinine was 1.08. Hypotensive on arrival. Started on isotonic bicarbonate solution, Needing aggressive potassium replacement, Creatinine improving  Hypotension:  Due to hypovolemia, responsive to fluids. Due to GI losses. With hypoperfusion and diarrhea, concerned for ischemic colitis but no abd pain and no blood in stool. With hypothermia, given broad-spectrum abx in ED, though lactate normal and doubt sepsis. - s/p 3L NS. Continue hydration with isotonic bicarb for significant volume deficit.  - Monitor chemistry panel -TSH and cortisol within normal limits Blood cultures no growth so far, continue broad-spectrum empiric antibiotics for now   Diarrhea: FOBT neg. No abd pain or emesis to indicate gastroenteritis - GIPP, C. diff  Hypokalemia: Due to virtually no po intake, GI losses from diarrhea, and hypothermia. ECG changes confined to QTc of 446msec. No ventricular ectopy or U waves noted. T waves are diffusely flattened compared to ECG 9/23. Continue to replete potassium  History of NSTEMI/CAD s/p PTCA and stenting of proximal/mid LAD w/DES x2: Last month by Dr. Einar Gip Appreciate Dr. Irven Shelling  Consult recommendations - Continue DAPT - NTG SL prn chest pain only.   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 - Hold hypertensives  Tobacco use disorder: Quit x2 weeks, placed on zyban at recent discharge - Hold zyban  Dyslipidemia: HDL 17 - Continue statin  Depression/anxiety:  - Hold sedating medications   DVT prophylaxsis heparin  Code Status:  Full code    Family Communication: Discussed in detail with the patient, all imaging results, lab results explained to the patient   Disposition Plan:  Continue stepdown, anticipate transfer to telemetry tomorrow    Consultants:  Nephrology  Cardiology    Procedures:  None  Antibiotics: Anti-infectives    Start     Dose/Rate Route Frequency Ordered Stop   01/07/16 1600  vancomycin (  VANCOCIN) 1,750 mg in sodium chloride 0.9 % 500 mL IVPB     1,750 mg 250 mL/hr over 120 Minutes Intravenous  Once 01/07/16 1521 01/07/16 1849   01/07/16 1530  piperacillin-tazobactam  (ZOSYN) IVPB 3.375 g     3.375 g 100 mL/hr over 30 Minutes Intravenous  Once 01/07/16 1521 01/07/16 1606         HPI/Subjective: Had some diarrhea today  Objective: Vitals:   01/08/16 0200 01/08/16 0300 01/08/16 0400 01/08/16 0500  BP: (!) 142/77 (!) 143/78 (!) 145/79 139/83  Pulse: 88 88 89 90  Resp: (!) 31 (!) 25 (!) 34 20  Temp:  98.2 F (36.8 C)    TempSrc:  Oral    SpO2: 97% 97% 97% 97%  Weight:      Height:        Intake/Output Summary (Last 24 hours) at 01/08/16 0804 Last data filed at 01/08/16 0600  Gross per 24 hour  Intake          4041.67 ml  Output             2250 ml  Net          1791.67 ml    Exam:  Examination:  General exam: Appears calm and comfortable  Respiratory system: Clear to auscultation. Respiratory effort normal. Cardiovascular system: S1 & S2 heard, RRR. No JVD, murmurs, rubs, gallops or clicks. No pedal edema. Gastrointestinal system: Abdomen is nondistended, soft and nontender. No organomegaly or masses felt. Normal bowel sounds heard. Central nervous system: Alert and oriented. No focal neurological deficits. Extremities: Symmetric 5 x 5 power. Skin: No rashes, lesions or ulcers Psychiatry: Judgement and insight appear normal. Mood & affect appropriate.     Data Reviewed: I have personally reviewed following labs and imaging studies  Micro Results Recent Results (from the past 240 hour(s))  MRSA PCR Screening     Status: None   Collection Time: 01/07/16  8:55 PM  Result Value Ref Range Status   MRSA by PCR NEGATIVE NEGATIVE Final    Comment:        The GeneXpert MRSA Assay (FDA approved for NASAL specimens only), is one component of a comprehensive MRSA colonization surveillance program. It is not intended to diagnose MRSA infection nor to guide or monitor treatment for MRSA infections.   C difficile quick scan w PCR reflex     Status: None   Collection Time: 01/08/16  3:56 AM  Result Value Ref Range Status   C Diff  antigen NEGATIVE NEGATIVE Final   C Diff toxin NEGATIVE NEGATIVE Final   C Diff interpretation No C. difficile detected.  Final    Radiology Reports Dg Chest 2 View  Result Date: 12/18/2015 CLINICAL DATA:  Midline upper and lower chest pain for 3 days with sweats. EXAM: CHEST  2 VIEW COMPARISON:  03/25/2009 FINDINGS: The lungs are clear wiithout focal pneumonia, edema, pneumothorax or pleural effusion. The cardiopericardial silhouette is within normal limits for size. The visualized bony structures of the thorax are intact. IMPRESSION: No active cardiopulmonary disease. Electronically Signed   By: Misty Stanley M.D.   On: 12/18/2015 12:45   Dg Chest Port 1 View  Result Date: 01/07/2016 CLINICAL DATA:  Hypotension and lightheadedness. EXAM: PORTABLE CHEST 1 VIEW COMPARISON:  12/18/2015 FINDINGS: Mild bibasilar atelectasis. No acute consolidation or effusion. Stable borderline cardiomegaly. Pulmonary vascularity within normal limits. No pneumothorax. IMPRESSION: Minimal bibasilar atelectasis Electronically Signed   By: Donavan Foil M.D.   On:  01/07/2016 15:20     CBC  Recent Labs Lab 01/07/16 1415 01/07/16 2130  WBC 8.7 7.8  HGB 12.3* 10.9*  HCT 34.8* 30.9*  PLT 105* 115*  MCV 86.8 86.1  MCH 30.7 30.4  MCHC 35.3 35.3  RDW 14.3 14.3  LYMPHSABS 2.3  --   MONOABS 0.7  --   EOSABS 0.2  --   BASOSABS 0.0  --     Chemistries   Recent Labs Lab 01/07/16 1415 01/07/16 2130 01/08/16 0555  NA 133*  --  138  K 2.3*  --  2.3*  CL 98*  --  106  CO2 15*  --  20*  GLUCOSE 122*  --  96  BUN 90*  --  74*  CREATININE 8.37* 6.34* 4.32*  CALCIUM 8.2*  --  7.6*  AST 73*  --   --   ALT 105*  --   --   ALKPHOS 79  --   --   BILITOT 0.3  --   --    ------------------------------------------------------------------------------------------------------------------ estimated creatinine clearance is 18 mL/min (by C-G formula based on SCr of 4.32 mg/dL  (H)). ------------------------------------------------------------------------------------------------------------------ No results for input(s): HGBA1C in the last 72 hours. ------------------------------------------------------------------------------------------------------------------ No results for input(s): CHOL, HDL, LDLCALC, TRIG, CHOLHDL, LDLDIRECT in the last 72 hours. ------------------------------------------------------------------------------------------------------------------  Recent Labs  01/08/16 0555  TSH 0.354   ------------------------------------------------------------------------------------------------------------------ No results for input(s): VITAMINB12, FOLATE, FERRITIN, TIBC, IRON, RETICCTPCT in the last 72 hours.  Coagulation profile No results for input(s): INR, PROTIME in the last 168 hours.  No results for input(s): DDIMER in the last 72 hours.  Cardiac Enzymes No results for input(s): CKMB, TROPONINI, MYOGLOBIN in the last 168 hours.  Invalid input(s): CK ------------------------------------------------------------------------------------------------------------------ Invalid input(s): POCBNP   CBG:  Recent Labs Lab 01/07/16 1459 01/07/16 2052  GLUCAP 114* 107*       Studies: Dg Chest Port 1 View  Result Date: 01/07/2016 CLINICAL DATA:  Hypotension and lightheadedness. EXAM: PORTABLE CHEST 1 VIEW COMPARISON:  12/18/2015 FINDINGS: Mild bibasilar atelectasis. No acute consolidation or effusion. Stable borderline cardiomegaly. Pulmonary vascularity within normal limits. No pneumothorax. IMPRESSION: Minimal bibasilar atelectasis Electronically Signed   By: Donavan Foil M.D.   On: 01/07/2016 15:20      Lab Results  Component Value Date   HGBA1C  05/02/2008    5.8 (NOTE)   The ADA recommends the following therapeutic goal for glycemic   control related to Hgb A1C measurement:   Goal of Therapy:   < 7.0% Hgb A1C   Reference: American  Diabetes Association: Clinical Practice   Recommendations 2008, Diabetes Care,  2008, 31:(Suppl 1).   Lab Results  Component Value Date   LDLCALC 46 12/18/2015   CREATININE 4.32 (H) 01/08/2016       Scheduled Meds: . aspirin  81 mg Oral Daily  . atorvastatin  80 mg Oral q1800  . heparin  5,000 Units Subcutaneous Q8H  . potassium chloride  20 mEq Oral BID  . sodium chloride flush  3 mL Intravenous Q12H  . ticagrelor  90 mg Oral BID   Continuous Infusions: .  sodium bicarbonate 150 mEq in sterile water 1000 mL infusion 100 mL/hr at 01/08/16 0512     LOS: 1 day    Time spent: >30 MINS    [facility]  [facility] Pager [phone]. If 7PM-7AM, please contact night-coverage at [email or web address], password [facility] 01/08/2016, 8:04 AM  LOS: 1 day

## 2016-01-08 NOTE — Evaluation (Signed)
Physical Therapy Evaluation and Discharge Patient Details Name: [name] MRN: [id number] DOB: 1955/10/08 Today's Date: 01/08/2016   History of Present Illness  [name] a 60 y.o.malewith a history of NSTEMI s/p PCI recently discharged Sept 2017, DM, HTN, and TIA brought by EMS for weakness and diarrhea. +dehydration with acute renal failure    Clinical Impression  Patient evaluated by Physical Therapy with no further PT needs identified. Patient walking with supervision due to risk of orthostasis. BP did decline while walking with pt asymptomatic. RN made aware. See below for any follow-up Physical Therapy or equipment needs. PT is signing off. Thank you for this referral.     Follow Up Recommendations No PT follow up    Equipment Recommendations  None recommended by PT    Recommendations for Other Services       Precautions / Restrictions Precautions Precautions: Fall Precaution Comments: if orthostatic      Mobility  Bed Mobility Overal bed mobility: Modified Independent             General bed mobility comments: incr effort  Transfers Overall transfer level: Needs assistance Equipment used: None Transfers: Sit to/from Stand Sit to Stand: Min guard         General transfer comment: for safety  Ambulation/Gait Ambulation/Gait assistance: Min guard;Supervision Ambulation Distance (Feet): 45 Feet Assistive device: None Gait Pattern/deviations: Step-through pattern;Decreased stride length     General Gait Details: Remained in room due to frequent diarrhea. No imbalance. Denied dizziness (although was orthostatic with walking)  Stairs            Wheelchair Mobility    Modified Rankin (Stroke Patients Only)       Balance Overall balance assessment: Independent                                           Pertinent Vitals/Pain HR 78-87 SaO2 97% on room air BP supine 145/80 and after walking 122/70  Pain  Assessment: No/denies pain    Home Living Family/patient expects to be discharged to:: Private residence Living Arrangements: Spouse/significant other Available Help at Discharge: Family Type of Home: Mobile home Home Access: Stairs to enter Entrance Stairs-Rails: Right;Left;Can reach both Entrance Stairs-Number of Steps: 6 Home Layout: One level Home Equipment: Crutches      Prior Function Level of Independence: Independent         Comments: disabled     Hand Dominance   Dominant Hand: Right    Extremity/Trunk Assessment   Upper Extremity Assessment: Overall WFL for tasks assessed           Lower Extremity Assessment: Overall WFL for tasks assessed      Cervical / Trunk Assessment: Normal  Communication   Communication: No difficulties  Cognition Arousal/Alertness: Awake/alert Behavior During Therapy: WFL for tasks assessed/performed Overall Cognitive Status: Within Functional Limits for tasks assessed                      General Comments      Exercises     Assessment/Plan    PT Assessment Patent does not need any further PT services  PT Problem List            PT Treatment Interventions      PT Goals (Current goals can be found in the Care Plan section)  Acute Rehab PT Goals  PT Goal Formulation: All assessment and education complete, DC therapy    Frequency     Barriers to discharge        Co-evaluation               End of Session Equipment Utilized During Treatment: Gait belt Activity Tolerance: Patient tolerated treatment well Patient left: in chair;with call bell/phone within reach;with nursing/sitter in room Nurse Communication: Mobility status;Other (comment) (orthostasis with gait; feet on floor in chair)         Time: [id number] PT Time Calculation (min) (ACUTE ONLY): 32 min   Charges:   PT Evaluation $PT Eval Moderate Complexity: 1 Procedure     PT G Codes:        Cicero Noy 15-Jan-2016, 4:09 PM   Pager [phone]

## 2016-01-08 NOTE — Progress Notes (Signed)
S: feel better.  Wants to eat.  Complains of feeling thirsty O:BP 139/83   Pulse 90   Temp 98.2 F (36.8 C) (Oral)   Resp 20   Ht _0  (1.676 m)   Wt 79.2 kg (174 lb 9.7 oz)   SpO2 97%   BMI 28.18 kg/m   Intake/Output Summary (Last 24 hours) at 01/08/16 0807 Last data filed at 01/08/16 0600  Gross per 24 hour  Intake          4041.67 ml  Output             2250 ml  Net          1791.67 ml   Weight change:  UKG:URKYH and alert CVS:RRR Resp:clear Abd:+ BS NTND Ext:No edema NEURO:CNI Ox3 no asterixis   . aspirin  81 mg Oral Daily  . atorvastatin  80 mg Oral q1800  . heparin  5,000 Units Subcutaneous Q8H  . potassium chloride  20 mEq Oral BID  . sodium chloride flush  3 mL Intravenous Q12H  . ticagrelor  90 mg Oral BID   Dg Chest Port 1 View  Result Date: 01/07/2016 CLINICAL DATA:  Hypotension and lightheadedness. EXAM: PORTABLE CHEST 1 VIEW COMPARISON:  12/18/2015 FINDINGS: Mild bibasilar atelectasis. No acute consolidation or effusion. Stable borderline cardiomegaly. Pulmonary vascularity within normal limits. No pneumothorax. IMPRESSION: Minimal bibasilar atelectasis Electronically Signed   By: Donavan Foil M.D.   On: 01/07/2016 15:20   BMET    Component Value Date/Time   NA 138 01/08/2016 0555   K 2.3 (LL) 01/08/2016 0555   CL 106 01/08/2016 0555   CO2 20 (L) 01/08/2016 0555   GLUCOSE 96 01/08/2016 0555   BUN 74 (H) 01/08/2016 0555   CREATININE 4.32 (H) 01/08/2016 0555   CALCIUM 7.6 (L) 01/08/2016 0555   GFRNONAA 14 (L) 01/08/2016 0555   GFRAA 16 (L) 01/08/2016 0555   CBC    Component Value Date/Time   WBC 7.8 01/07/2016 2130   RBC 3.59 (L) 01/07/2016 2130   HGB 10.9 (L) 01/07/2016 2130   HCT 30.9 (L) 01/07/2016 2130   PLT 115 (L) 01/07/2016 2130   MCV 86.1 01/07/2016 2130   MCH 30.4 01/07/2016 2130   MCHC 35.3 01/07/2016 2130   RDW 14.3 01/07/2016 2130   LYMPHSABS 2.3 01/07/2016 1415   MONOABS 0.7 01/07/2016 1415   EOSABS 0.2 01/07/2016 1415   BASOSABS 0.0 01/07/2016 1415     Assessment:  1. ARF sec ATN from hypotension 2. Hypokalemia 3. Met acidosis, improved 4. Hx HTN  SP Rt renal art stent years ago in [place] 5. CAD  Plan: 1. Replace K 2. Decrease bicarb gtt.  I would leave for 1 more day 3. OK to eat 4. Recheck labs in AM 5. Check renal US 6. Resume BP med..I would recommend first starting with amlodipine   [name]

## 2016-01-08 NOTE — Progress Notes (Signed)
Initial Nutrition Assessment  DOCUMENTATION CODES:   Not applicable  INTERVENTION:   -Boost Breeze po q HS, each supplement provides 250 kcal and 9 grams of protein  NUTRITION DIAGNOSIS:   Unintentional weight loss related to poor appetite (diarrhea) as evidenced by per patient/family report, percent weight loss.  GOAL:   Patient will meet greater than or equal to 90% of their needs  MONITOR:   PO intake, Supplement acceptance, Labs, Weight trends, Skin, I & O's  REASON FOR ASSESSMENT:   Consult Assessment of nutrition requirement/status  ASSESSMENT:   [name]  is a 59 y.o. 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  He presented to his PCP on the morning of 12/18/2015 with chest pain and NSTEMI, underwent coronary angiography the following morning revealing high-grade proximal LAD stenosis for which he underwent successful angioplasty.   Pt admitted with acute renal failure secondary to severe dehydration from diarrhea.   Spoke with pt at bedside, who reports he is very eager to eat. He is currently snacking on applesauce, gingerale, and graham crackers that RN provided just prior to RD visit. Pt reports good appetite (2 meals per day that included meat, starch, and vegetable; pt consumed baked proteins, rarely eats fried foods, and does not add salt to food) up until 2 weeks ago. He shares that he has experienced a general decline in health since receiving heart cath approximately 2 weeks ago; biggest complaints are feeling weak and ongoing diarrhea. Since this time period pt reports he ate "nothing". Upon further probing, pt reports consuming mainly liquids (water and Pepsi).   Pt states UBW is around 180#. Pt estimates he has lost 10-15# within the past week. Per wt hx, pt has experienced a 20# (10.3%) wt loss over the past 3  weeks. Suspect wt loss is related to dehydration and minimal PO intake.   Nutrition-Focused physical exam completed. Findings are no fat depletion, mild muscle depletion, and mild edema.   Pt eagerly awaiting tray and also shared concerns regarding renal function. RD educated pt on basics of renal diet, so pt would be aware of diet restrictions when ordering meals. Discussed importance of good meal intake to promote healing.   Case discussed with RN.   Labs reviewed: K: 2.3 (on oral supplementation), CBGS: 107.   Diet Order:  Diet renal with fluid restriction Fluid restriction: 1200 mL Fluid; Room service appropriate? Yes; Fluid consistency: Thin  Skin:  Reviewed, no issues  Last BM:  01/08/16  Height:   Ht Readings from Last 1 Encounters:  01/07/16 5\' 6"  (1.676 m)    Weight:   Wt Readings from Last 1 Encounters:  01/07/16 174 lb 9.7 oz (79.2 kg)    Ideal Body Weight:  64.5 kg  BMI:  Body mass index is 28.18 kg/m.  Estimated Nutritional Needs:   Kcal:  1700-1900  Protein:  80-95 grams  Fluid:  1.7-1.9 L  EDUCATION NEEDS:   Education needs addressed  Loris Winrow A. Jimmye Norman, RD, LDN, CDE Pager: [phone] After hours Pager: [phone]

## 2016-01-09 DIAGNOSIS — I2510 Atherosclerotic heart disease of native coronary artery without angina pectoris: Secondary | ICD-10-CM

## 2016-01-09 DIAGNOSIS — K629 Disease of anus and rectum, unspecified: Secondary | ICD-10-CM

## 2016-01-09 DIAGNOSIS — E876 Hypokalemia: Secondary | ICD-10-CM

## 2016-01-09 DIAGNOSIS — K922 Gastrointestinal hemorrhage, unspecified: Secondary | ICD-10-CM

## 2016-01-09 DIAGNOSIS — Z9861 Coronary angioplasty status: Secondary | ICD-10-CM

## 2016-01-09 LAB — CBC
HCT: 32.9 % — ABNORMAL LOW (ref 39.0–52.0)
HCT: 32.6 % — ABNORMAL LOW (ref 39.0–52.0)
HCT: 32.5 % — ABNORMAL LOW (ref 39.0–52.0)
HCT: 35.1 % — ABNORMAL LOW (ref 39.0–52.0)
HCT: 32.1 % — ABNORMAL LOW (ref 39.0–52.0)
Hemoglobin: 11.4 g/dL — ABNORMAL LOW (ref 13.0–17.0)
Hemoglobin: 11.4 g/dL — ABNORMAL LOW (ref 13.0–17.0)
Hemoglobin: 11.5 g/dL — ABNORMAL LOW (ref 13.0–17.0)
Hemoglobin: 12.4 g/dL — ABNORMAL LOW (ref 13.0–17.0)
Hemoglobin: 11 g/dL — ABNORMAL LOW (ref 13.0–17.0)
MCH: 30.1 pg (ref 26.0–34.0)
MCH: 30.3 pg (ref 26.0–34.0)
MCH: 30.7 pg (ref 26.0–34.0)
MCH: 30.8 pg (ref 26.0–34.0)
MCH: 30.2 pg (ref 26.0–34.0)
MCHC: 34.7 g/dL (ref 30.0–36.0)
MCHC: 35 g/dL (ref 30.0–36.0)
MCHC: 35.4 g/dL (ref 30.0–36.0)
MCHC: 35.3 g/dL (ref 30.0–36.0)
MCHC: 34.3 g/dL (ref 30.0–36.0)
MCV: 86.8 fL (ref 78.0–100.0)
MCV: 86.7 fL (ref 78.0–100.0)
MCV: 86.7 fL (ref 78.0–100.0)
MCV: 87.3 fL (ref 78.0–100.0)
MCV: 88.2 fL (ref 78.0–100.0)
Platelets: 120 10*3/uL — ABNORMAL LOW (ref 150–400)
Platelets: 113 10*3/uL — ABNORMAL LOW (ref 150–400)
Platelets: 119 10*3/uL — ABNORMAL LOW (ref 150–400)
Platelets: 117 10*3/uL — ABNORMAL LOW (ref 150–400)
Platelets: 111 10*3/uL — ABNORMAL LOW (ref 150–400)
RBC: 3.79 MIL/uL — ABNORMAL LOW (ref 4.22–5.81)
RBC: 3.76 MIL/uL — ABNORMAL LOW (ref 4.22–5.81)
RBC: 3.75 MIL/uL — ABNORMAL LOW (ref 4.22–5.81)
RBC: 4.02 MIL/uL — ABNORMAL LOW (ref 4.22–5.81)
RBC: 3.64 MIL/uL — ABNORMAL LOW (ref 4.22–5.81)
RDW: 14.1 % (ref 11.5–15.5)
RDW: 14 % (ref 11.5–15.5)
RDW: 13.8 % (ref 11.5–15.5)
RDW: 13.8 % (ref 11.5–15.5)
RDW: 13.9 % (ref 11.5–15.5)
WBC: 6.2 10*3/uL (ref 4.0–10.5)
WBC: 6.5 10*3/uL (ref 4.0–10.5)
WBC: 5.9 10*3/uL (ref 4.0–10.5)
WBC: 6.9 10*3/uL (ref 4.0–10.5)
WBC: 6.3 10*3/uL (ref 4.0–10.5)

## 2016-01-09 LAB — COMPREHENSIVE METABOLIC PANEL
ALT: 77 U/L — ABNORMAL HIGH (ref 17–63)
AST: 63 U/L — ABNORMAL HIGH (ref 15–41)
Albumin: 2.5 g/dL — ABNORMAL LOW (ref 3.5–5.0)
Alkaline Phosphatase: 85 U/L (ref 38–126)
Anion gap: 10 (ref 5–15)
BUN: 42 mg/dL — ABNORMAL HIGH (ref 6–20)
CO2: 30 mmol/L (ref 22–32)
Calcium: 7.8 mg/dL — ABNORMAL LOW (ref 8.9–10.3)
Chloride: 98 mmol/L — ABNORMAL LOW (ref 101–111)
Creatinine, Ser: 1.7 mg/dL — ABNORMAL HIGH (ref 0.61–1.24)
GFR calc Af Amer: 49 mL/min — ABNORMAL LOW (ref >60)
GFR calc non Af Amer: 42 mL/min — ABNORMAL LOW (ref >60)
Glucose, Bld: 102 mg/dL — ABNORMAL HIGH (ref 65–99)
Potassium: 2.7 mmol/L — CL (ref 3.5–5.1)
Sodium: 138 mmol/L (ref 135–145)
Total Bilirubin: 0.8 mg/dL (ref 0.3–1.2)
Total Protein: 6.1 g/dL — ABNORMAL LOW (ref 6.5–8.1)

## 2016-01-09 LAB — OCCULT BLOOD X 1 CARD TO LAB, STOOL
Fecal Occult Bld: POSITIVE — AB
Fecal Occult Bld: POSITIVE — AB

## 2016-01-09 LAB — MAGNESIUM: Magnesium: 1.3 mg/dL — ABNORMAL LOW (ref 1.7–2.4)

## 2016-01-09 MED ORDER — SODIUM CHLORIDE 0.9 % IV SOLN
8.0000 mg/h | INTRAVENOUS | Status: DC
  Filled 2016-01-09 (×2): qty 80

## 2016-01-09 MED ORDER — POTASSIUM CHLORIDE CRYS ER 20 MEQ PO TBCR
40.0000 meq | EXTENDED_RELEASE_TABLET | Freq: Four times a day (QID) | ORAL | Status: AC
  Administered 2016-01-09 (×2): 40 meq via ORAL
  Filled 2016-01-09 (×2): qty 2

## 2016-01-09 MED ORDER — POTASSIUM CHLORIDE CRYS ER 20 MEQ PO TBCR: 40.0000 meq | EXTENDED_RELEASE_TABLET | Freq: Four times a day (QID) | ORAL | Status: DC

## 2016-01-09 MED ORDER — MAGNESIUM SULFATE 2 GM/50ML IV SOLN
2.0000 g | Freq: Once | INTRAVENOUS | Status: AC
  Administered 2016-01-09: 2 g via INTRAVENOUS
  Filled 2016-01-09: qty 50

## 2016-01-09 MED ORDER — BACLOFEN 5 MG HALF TABLET
5.0000 mg | ORAL_TABLET | Freq: Three times a day (TID) | ORAL | Status: DC
  Administered 2016-01-09 – 2016-01-10 (×6): 5 mg via ORAL
  Filled 2016-01-09 (×7): qty 1

## 2016-01-09 MED ORDER — ZOLPIDEM TARTRATE 5 MG PO TABS
5.0000 mg | ORAL_TABLET | Freq: Every evening | ORAL | Status: DC | PRN
  Administered 2016-01-10 – 2016-01-13 (×3): 5 mg via ORAL
  Filled 2016-01-09 (×3): qty 1

## 2016-01-09 MED ORDER — POTASSIUM CHLORIDE IN NACL 40-0.9 MEQ/L-% IV SOLN
INTRAVENOUS | Status: DC
  Administered 2016-01-09 – 2016-01-10 (×2): 75 mL/h via INTRAVENOUS
  Filled 2016-01-09 (×2): qty 1000

## 2016-01-09 MED ORDER — POTASSIUM CHLORIDE 10 MEQ/100ML IV SOLN
10.0000 meq | INTRAVENOUS | Status: AC
  Administered 2016-01-09 (×4): 10 meq via INTRAVENOUS
  Filled 2016-01-09 (×4): qty 100

## 2016-01-09 MED ORDER — PANTOPRAZOLE SODIUM 40 MG IV SOLR: 40.0000 mg | Freq: Two times a day (BID) | INTRAVENOUS | Status: DC

## 2016-01-09 MED ORDER — SODIUM CHLORIDE 0.9 % IV SOLN
80.0000 mg | Freq: Once | INTRAVENOUS | Status: AC
  Administered 2016-01-09: 80 mg via INTRAVENOUS
  Filled 2016-01-09: qty 80

## 2016-01-09 MED ORDER — SODIUM CHLORIDE 0.9 % IV SOLN
INTRAVENOUS | Status: DC
  Administered 2016-01-09: 09:00:00 via INTRAVENOUS

## 2016-01-09 MED ORDER — PANTOPRAZOLE SODIUM 40 MG PO TBEC
40.0000 mg | DELAYED_RELEASE_TABLET | Freq: Every day | ORAL | Status: DC
Start: 2016-01-10 — End: 2016-01-13
  Administered 2016-01-10 – 2016-01-13 (×4): 40 mg via ORAL
  Filled 2016-01-09 (×4): qty 1

## 2016-01-09 NOTE — Consult Note (Addendum)
[facility] Consult: 11:32 AM 01/09/2016  LOS: 2 days    Referring Provider: Dr Allyson Sabal.    Primary Care Physician:  Ricke Hey, MD Primary Gastroenterologist:   unassigned  IMPRESSION:   *  Bloody stool, painless.  Rectal mass on digital exam.  Needs colonoscopy.  Negative stool pathogen so doubt watery stools are infectious.  *  9/21 tandem DES and angioplasties.  Non STEMI at presentation. On 81 ASA, Brlinita. Brilinta now discontinued, last dose 0900 today.    *  Hiccups.  No previous hx or sxs to suggest GERD.  *  Thrombocytopenia.   *  AKI.  Hard to correct hypokalemia.  Hypomagnesemia.    *  Remote hx crack cocaine addiction.  No hepatitis testing in records.     PLAN:     *  Needs colonoscopy vs flex sig and possibly EGD as well.  Will discuss timing with Dr Carlean Purl  *  No need of IV PPI, discontinued the drip.    *  Hep B and C testing.    [name]  01/09/2016, 11:32 AM Pager: [phone]      [facility] Attending   I have taken an interval history, reviewed the chart and examined the patient. I agree with the Advanced Practitioner's note, impression and recommendations.    Needs correction of metabolic derangements before endoscopic evaluation. ? Just flex sig vs pursuing colonoscopy Is s/p non stemi but is revascularized so would probably aim for colonoscopy. Might be Monday before we get that done. Would not wait for Brilinta to leave system to pursue eval.  Complicated patient and at higher risk of problems from endoscopic evaluation.  Gatha Mayer, MD, [facility] [phone] (pager) [phone] after 5 PM, weekends and holidays  01/09/2016 8:14 PM    Reason for Consultation:  Watery stools and now bloody stools.       HPI: [name] is a 60 y.o. male.  PMH HTN. DM 2.  Previous crack cocaine addict, quit around 2007. Depression, anxiety.   S/P umbilical hernia repair around age 40.  History peripheral vascular disease. Status post right renal artery stent.  History previous nonobstructing CAD. NSTEMI 11/2015.  S/P cardiac cath with PTCA and stenting of LAD disease, 2 overlapping drug-eluting stents placed, angioplasties  RCA and LAD. Patient discharged on Brilinta, 81 mg ASA. He continued to take daily Indocin.  Thrombocytopenia noted on labs during the admission ranging from 80-115    A week or so after the admission, patient developed watery, brown stool 2 x daily. Previously pattern:  1 to 2 formed, brown stools daily. He lost his appetite. He felt malaise. He estimates that his weight has dropped 20 pounds or so. No nausea or vomiting. No dysphagia. No pyrosis.  At that point he stopped taking the indomethacin. Patient has never undergone colonoscopy or upper endoscopy.  With progression of weakness and positional dizziness, his PMD advised him to decrease his blood pressure meds. Blood pressure on the day of admission on home measurement was 50s over  58s.  He hadn't been urinating much. He called EMS. In the ED heart rate in the 50s, blood pressure 74/42, rectal temperature 95.5. He was in acute kidney injury. Hypokalemic.  WBCs normal.  AST/ALT 73/105 with normal alkaline phosphatase and total bilirubin.  LFTs have not been obtained during his September admission so there is no comparisons.   Stool pathogen panel including C. difficile are all negative. Renal ultrasound performed. There hasn't been any dedicated GI imaging however. A CT abdomen and chest with angiography of 2010 revealed mild celiac artery narrowing, SMA and IMA patent. No aortic aneurysm. Normal appearance to liver, gallbladder, spleen, pancreas, kidneys.  Since admission he has received both IV and oral supplementation of potassium but he is still  hypokalemic. Low magnesium supplemented today. LFTs improved. During this hospitalization he has developed pickups but still denies reflux and heartburn as well as nausea or vomiting. At about 3 AM today he started passing bloody stool. Observe stool looks to be both bloody as well as Lattanzio in color. Still not having any abdominal pain. Ptotonix drip ordered, just received the bolus dose.   Patient's parents both had heart issues. His mother specifically had hepatitis C and related liver disease, probably got the virus from a transfusion. No family history of colon or other intestinal disorders and no history of ulcer disease as far as he knows. Patient doesn't use illicit drugs or drink alcohol.     Past Medical History:  Diagnosis Date  . Anxiety   . Arthritis   . Dementia   . Depression   . Diabetes mellitus   . Gout   . Hypertension   . TIA (transient ischemic attack)     Past Surgical History:  Procedure Laterality Date  . CARDIAC CATHETERIZATION N/A 12/18/2015   Procedure: Left Heart Cath and Coronary Angiography;  Surgeon: Adrian Prows, MD;  Location: [facility];  Service: Cardiovascular;  Laterality: N/A;  . CARDIAC CATHETERIZATION N/A 12/18/2015   Procedure: Coronary Stent Intervention;  Surgeon: Adrian Prows, MD;  Location: [facility];  Service: Cardiovascular;  Laterality: N/A;  . CARDIAC CATHETERIZATION N/A 12/18/2015   Procedure: Coronary Balloon Angioplasty;  Surgeon: Adrian Prows, MD;  Location: [facility];  Service: Cardiovascular;  Laterality: N/A;  . HERNIA REPAIR    . PERIPHERAL VASCULAR CATHETERIZATION N/A 12/18/2015   Procedure: Abdominal Aortogram;  Surgeon: Adrian Prows, MD;  Location: [facility];  Service: Cardiovascular;  Laterality: N/A;    Prior to Admission medications   Medication Sig Start Date End Date Taking? Authorizing Provider  aspirin 81 MG chewable tablet Chew 1 tablet (81 mg total) by mouth daily. 12/19/15  Yes Neldon Labella,  NP  atorvastatin (LIPITOR) 80 MG tablet Take 1 tablet (80 mg total) by mouth daily at 6 PM. 12/19/15  Yes Neldon Labella, NP  buPROPion (WELLBUTRIN SR) 150 MG 12 hr tablet Take 1 tablet (150 mg total) by mouth 2 (two) times daily. 12/19/15  Yes Neldon Labella, NP  cloNIDine (CATAPRES) 0.2 MG tablet Take 0.2 mg by mouth 2 (two) times daily. 09/11/15  Yes Historical Provider, MD  colchicine 0.6 MG tablet Take 1 tablet (0.6 mg total) by mouth daily. 05/18/11 01/07/16 Yes Tiffany Carlota Raspberry, PA-C  indomethacin (INDOCIN) 50 MG capsule Take 50 mg by mouth daily. 12/16/15  Yes Historical Provider, MD  lisinopril-hydrochlorothiazide (PRINZIDE,ZESTORETIC) 20-12.5 MG per tablet Take 1 tablet by mouth daily. 08/04/11  Yes Kingsley Spittle, MD  LORazepam (ATIVAN) 1 MG tablet  Take 1 tablet (1 mg total) by mouth every morning. 08/04/11  Yes Kingsley Spittle, MD  metoprolol tartrate (LOPRESSOR) 100 MG tablet Take 1 tablet (100 mg total) by mouth 2 (two) times daily. 12/19/15  Yes Neldon Labella, NP  nitroGLYCERIN (NITROSTAT) 0.4 MG SL tablet Place 0.4 mg under the tongue every 5 (five) minutes as needed. For chest pain   Yes Historical Provider, MD  QUEtiapine (SEROQUEL XR) 400 MG 24 hr tablet Take 1 tablet (400 mg total) by mouth at bedtime. 08/04/11  Yes Kingsley Spittle, MD  sertraline (ZOLOFT) 100 MG tablet Take 1 tablet (100 mg total) by mouth daily. Patient taking differently: Take 200 mg by mouth daily.  08/04/11  Yes Kingsley Spittle, MD  ticagrelor (BRILINTA) 90 MG TABS tablet Take 1 tablet (90 mg total) by mouth 2 (two) times daily. 12/19/15  Yes Neldon Labella, NP  HYDROcodone-acetaminophen (NORCO/VICODIN) 5-325 MG tablet Take 1 tablet by mouth every 4 (four) hours as needed. Patient not taking: Reported on 01/07/2016 10/13/15   Konrad Felix, PA  predniSONE (DELTASONE) 20 MG tablet Take 2 tablets (40 mg total) by mouth daily. Patient not taking: Reported on 01/07/2016 01/08/15   Waynetta Pean, PA-C    Scheduled Meds: .  atorvastatin  80 mg Oral q1800  . baclofen  5 mg Oral TID  . feeding supplement  1 Container Oral QHS  . magnesium sulfate 1 - 4 g bolus IVPB  2 g Intravenous Once  . pantoprazole (PROTONIX) IVPB  80 mg Intravenous Once  . [START ON 01/12/2016] pantoprazole  40 mg Intravenous Q12H  . potassium chloride  40 mEq Oral QID  . sodium chloride flush  3 mL Intravenous Q12H   Infusions: . 0.9 % NaCl with KCl 40 mEq / L    . pantoprozole (PROTONIX) infusion     PRN Meds: nitroGLYCERIN   Allergies as of 01/07/2016  . (No Known Allergies)    Family History  Problem Relation Age of Onset  . Heart attack Mother   . Heart attack Father   . IgA nephropathy Son     Social History   Social History  . Marital status: Legally Separated    Spouse name: N/A  . Number of children: N/A  . Years of education: N/A   Occupational History  . Not on file.   Social History Main Topics  . Smoking status: Current Every Day Smoker    Packs/day: 0.50  . Smokeless tobacco: Current User  . Alcohol use No     Comment: former, last 5 yrs  . Drug use: No  . Sexual activity: Not on file   Other Topics Concern  . Not on file   Social History Narrative  . No narrative on file    REVIEW OF SYSTEMS: Constitutional:  Weakness, dizziness.  No sick contacts ENT:  No nose bleeds Pulm:  Not short of breath not coughing. CV:  No palpitations, no LE edema. No chest pain GU:  No hematuria, no frequency GI:  Per HPI Heme:  Denies unusual or excessive bleeding. No significant bruising.   Transfusions:  No previous blood transfusions. Neuro:  No headaches, no peripheral tingling or numbness.  No seizures. Derm:  No itching, no rash or sores.  Endocrine:  No sweats or chills.  No polyuria or dysuria Immunization:  Did not inquire.    PHYSICAL EXAM: Vital signs in last 24 hours: Vitals:   01/09/16 0410 01/09/16 0756  BP: (!) 167/91 (!) 174/90  Pulse: 87 75  Resp: (!) 28 (!) 26  Temp: 98.8 F  (37.1 C) 98.1 F (36.7 C)   Wt Readings from Last 3 Encounters:  01/09/16 77.9 kg (171 lb 11.8 oz)  12/19/15 88.4 kg (194 lb 14.2 oz)  01/08/15 86.2 kg (190 lb)    General: Pleasant, calm. Looks chronically unwell. Head:  No facial asymmetry or swelling. No signs of head trauma  Eyes:  No scleral icterus. No conjunctival pallor. Ears:  Not hard of hearing.  Nose:  No congestion or discharge. Mouth:  Clear oral mucosa. Some missing teeth but remaining teeth in fairly good repair. Tongue is midline Neck:  No mass, no JVD. No TMG. Lungs:  Clear bilaterally. No labored breathing or cough. Heart: RRR. No MRG. S1, S2 present. Abdomen:  Soft. Not tender or distended. Bowel sounds hypoactive but no tinkling or tympanitic bowel sounds. No HSM or masses. No bruits..   Rectal: firm, enlarged prostate.  Rectal mass overlies the prostate, Was able to get my index finger beyond the mass, red blood on exam glove.     Musc/Skeltl: no joint erythema or swelling Extremities:  No CCE  Neurologic:  Oriented x 3.  Fully alert.  No limb weakness.  No tremor.  No gross deficits Skin:  No telangectasia, rash or sores.  + UE purpura.  Psych:  Pleasant.  Cooperative.  Calm.    Intake/Output from previous day: 10/12 0701 - 10/13 0700 In: 2691.3 [P.O.:780; I.V.:1711.3; IV Piggyback:200] Out: 2775 [Urine:2775] Intake/Output this shift: Total I/O In: 250 [I.V.:150; IV Piggyback:100] Out: 800 [Urine:800]  LAB RESULTS:  Recent Labs  01/09/16 0206 01/09/16 0718 01/09/16 1036  WBC 6.2 6.5 5.9  HGB 11.4* 11.4* 11.5*  HCT 32.9* 32.6* 32.5*  PLT 120* 113* 119*   BMET Lab Results  Component Value Date   NA 138 01/09/2016   NA 138 01/08/2016   NA 133 (L) 01/07/2016   K 2.7 (LL) 01/09/2016   K 2.3 (LL) 01/08/2016   K 2.3 (LL) 01/07/2016   CL 98 (L) 01/09/2016   CL 106 01/08/2016   CL 98 (L) 01/07/2016   CO2 30 01/09/2016   CO2 20 (L) 01/08/2016   CO2 15 (L) 01/07/2016   GLUCOSE 102 (H)  01/09/2016   GLUCOSE 96 01/08/2016   GLUCOSE 122 (H) 01/07/2016   BUN 42 (H) 01/09/2016   BUN 74 (H) 01/08/2016   BUN 90 (H) 01/07/2016   CREATININE 1.70 (H) 01/09/2016   CREATININE 4.32 (H) 01/08/2016   CREATININE 6.34 (H) 01/07/2016   CALCIUM 7.8 (L) 01/09/2016   CALCIUM 7.6 (L) 01/08/2016   CALCIUM 8.2 (L) 01/07/2016   LFT  Recent Labs  01/07/16 1415 01/08/16 0555 01/09/16 0206  PROT 6.7  --  6.1*  ALBUMIN 2.6* 2.4* 2.5*  AST 73*  --  63*  ALT 105*  --  77*  ALKPHOS 79  --  85  BILITOT 0.3  --  0.8   Lipase     Component Value Date/Time   LIPASE 27 05/01/2008 1842     RADIOLOGY STUDIES: US Renal  Result Date: 01/08/2016 CLINICAL DATA:  Acute renal failure EXAM: RENAL / URINARY TRACT ULTRASOUND COMPLETE COMPARISON:  CT scan of the abdomen and pelvis of May 01, 2008 FINDINGS: Right Kidney: Length: 11.9 cm. The renal cortical echotexture remains lower than that of the adjacent liver. There is no hydronephrosis. There is no focal mass nor evidence of stones. Left Kidney: Length: 11.9 cm. The renal cortical  echotexture is similar to that of the right kidney. There is a mid to lower pole cyst measuring 1.9 cm in greatest dimension. There is no hydronephrosis, solid mass, nor evidence of stones. Bladder: The partially distended urinary bladder is normal. IMPRESSION: No acute or significant chronic abnormality of the kidneys or urinary bladder. Electronically Signed   By: David  Martinique M.D.   On: 01/08/2016 10:05   Dg Chest Port 1 View  Result Date: 01/07/2016 CLINICAL DATA:  Hypotension and lightheadedness. EXAM: PORTABLE CHEST 1 VIEW COMPARISON:  12/18/2015 FINDINGS: Mild bibasilar atelectasis. No acute consolidation or effusion. Stable borderline cardiomegaly. Pulmonary vascularity within normal limits. No pneumothorax. IMPRESSION: Minimal bibasilar atelectasis Electronically Signed   By: Donavan Foil M.D.   On: 01/07/2016 15:20

## 2016-01-09 NOTE — Progress Notes (Signed)
OT Cancellation Note  Patient Details Name: GREY TURKNETT MRN: [id number] DOB: [birth year]   Cancelled Treatment:    Reason Eval/Treat Not Completed: OT screened, no needs identified, will sign off.  Pt is performing ADLs at supervision - mod I.    [place], OTR/L [id number]   Lucille Passy M 01/09/2016, 3:16 PM

## 2016-01-09 NOTE — Progress Notes (Signed)
[facility] PROGRESS NOTE  [name] [id number] DOB: [birth year] DOA: 01/07/2016   PCP: Ricke Hey, MD     Assessment/Plan: Principal Problem:   Acute renal failure (ARF) ([place]) Active Problems:   Hypokalemia   Hypotension   Hypothermia   Renovascular hypertension   History of non-ST elevation myocardial infarction (NSTEMI)   Tobacco use   Diarrhea   Dyslipidemia   Anuria    [name]  is a 60 y.o. 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 He presented to his PCP on the morning of 12/18/2015 with chest pain and NSTEMI, underwent coronary angiography the following morning revealing high-grade proximal LAD stenosis for which he underwent successful angioplasty. 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 His blood pressure has been high and uncontrolled. He had been compliant with his medications.  He was discharged home on 12/19/2015, states that he felt well until 4-5 days ago he started having frequent diarrhea.EMS reported BP 74/42, HR 50's, 97% on RA, and gave a NS bolus en route.rectal temperature of 95.48F. Creatinine was 8.37 (from recent 1.09) with an anion gap metabolic acidosis and normal lactate. WBC 8.7. K 2.3  Assessment and plan Acute renal failure: Cr >8 from baseline 1.1-1.2. Prerenal insult due to profound hypoperfusion from hypovolemia and antihypertensives. Also worsened by ACE inhibitor/HCTZ. No indication for urgent HD, but pt aware this may be required if no renal recovery. - Nephrology consulted, patient has been on an ACE inhibitor, had an angiogram on 12/18/15. Preprocedure creatinine was 1.08. Hypotensive on arrival. Started on isotonic bicarbonate solution, now discontinued Needing aggressive potassium/magnesium replacement, Creatinine  improved from 8.37>1.7 Nephrology has signed off  Melena  Likely ischemic colitis, holding DAPT, discussed with Dr Marcellina Millin to proceed with sedation, GI intervention Start PPI gtt, volume resuscitation [facility] consulted   Hypotension: Due to hypovolemia, resolved.   Due to GI losses. With hypoperfusion and diarrhea, concerned for ischemic colitis but no abd pain and no blood in stool. With hypothermia, given broad-spectrum abx in ED, though lactate normal and doubt sepsis. Status post aggressive fluid resuscitation  -TSH and cortisol within normal limits Blood cultures no growth so far, continue broad-spectrum empiric antibiotics until 10/14 if no growth and discontinue   Diarrhea: FOBT neg. No abd pain or emesis to indicate gastroenteritis - GIPP, C. diff  Hypokalemia: Due to virtually no po intake, GI losses from diarrhea, and hypothermia. ECG changes confined to QTc of 414msec. No ventricular ectopy or U waves noted. T waves are diffusely flattened compared to ECG 9/23. Continue to replete potassium  History of NSTEMI/CAD s/p PTCA and stenting of proximal/mid LAD w/DES x2: Last month by Dr. Einar Gip Appreciate Dr. Irven Shelling  Consult recommendations Holding  DAPT due to melena - NTG SL prn chest pain only.   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 - Hold hypertensives  Tobacco use disorder: Quit x2 weeks, placed on zyban at recent discharge - Hold zyban  Dyslipidemia: HDL 17 - Continue statin  Depression/anxiety:  - Hold sedating medications   DVT prophylaxsis heparin  Code Status:  Full code    Family Communication: Discussed in detail with the patient, all imaging results, lab results explained to the patient   Disposition Plan:  Transfer to telemetry    Consultants:  Nephrology  Cardiology  Procedures:  None  Antibiotics: Anti-infectives    Start     Dose/Rate Route Frequency Ordered Stop   01/07/16 1600  vancomycin (VANCOCIN) 1,750  mg in sodium chloride 0.9 % 500 mL IVPB     1,750 mg 250 mL/hr over 120 Minutes Intravenous  Once 01/07/16 1521 01/07/16 1849   01/07/16 1530  piperacillin-tazobactam (ZOSYN) IVPB 3.375 g     3.375 g 100 mL/hr over 30 Minutes Intravenous  Once 01/07/16 1521 01/07/16 1606         HPI/Subjective: 3 episodes of gross melena ,starting at 3 am   Objective: Vitals:   01/08/16 2000 01/08/16 2300 01/09/16 0410 01/09/16 0756  BP: (!) 155/83 (!) 155/94 (!) 167/91 (!) 174/90  Pulse: 83 74 87 75  Resp: (!) 27 (!) 22 (!) 28 (!) 26  Temp: 98.9 F (37.2 C) 98.5 F (36.9 C) 98.8 F (37.1 C) 98.1 F (36.7 C)  TempSrc: Oral Oral Oral Oral  SpO2: 98% 96% 97% 94%  Weight:   77.9 kg (171 lb 11.8 oz)   Height:        Intake/Output Summary (Last 24 hours) at 01/09/16 0936 Last data filed at 01/09/16 0600  Gross per 24 hour  Intake          2571.25 ml  Output             2575 ml  Net            -3.75 ml    Exam:  Examination:  General exam: Appears calm and comfortable  Respiratory system: Clear to auscultation. Respiratory effort normal. Cardiovascular system: S1 & S2 heard, RRR. No JVD, murmurs, rubs, gallops or clicks. No pedal edema. Gastrointestinal system: Abdomen is nondistended, soft and nontender. No organomegaly or masses felt. Normal bowel sounds heard. Central nervous system: Alert and oriented. No focal neurological deficits. Extremities: Symmetric 5 x 5 power. Skin: No rashes, lesions or ulcers Psychiatry: Judgement and insight appear normal. Mood & affect appropriate.     Data Reviewed: I have personally reviewed following labs and imaging studies  Micro Results Recent Results (from the past 240 hour(s))  Blood Culture (routine x 2)     Status: None (Preliminary result)   Collection Time: 01/07/16  2:22 PM  Result Value Ref Range Status   Specimen Description BLOOD LEFT ANTECUBITAL  Final   Special Requests BOTTLES DRAWN AEROBIC AND ANAEROBIC 5CC  Final    Culture NO GROWTH < 24 HOURS  Final   Report Status PENDING  Incomplete  Urine culture     Status: None   Collection Time: 01/07/16  4:44 PM  Result Value Ref Range Status   Specimen Description URINE, CLEAN CATCH  Final   Special Requests NONE  Final   Culture NO GROWTH  Final   Report Status 01/08/2016 FINAL  Final  Blood Culture (routine x 2)     Status: None (Preliminary result)   Collection Time: 01/07/16  4:56 PM  Result Value Ref Range Status   Specimen Description BLOOD UNK  Final   Special Requests BOTTLES DRAWN AEROBIC AND ANAEROBIC 5CC  Final   Culture NO GROWTH < 24 HOURS  Final   Report Status PENDING  Incomplete  MRSA PCR Screening     Status: None   Collection Time: 01/07/16  8:55 PM  Result Value Ref Range Status   MRSA by PCR NEGATIVE NEGATIVE Final    Comment:        The GeneXpert MRSA Assay (  FDA approved for NASAL specimens only), is one component of a comprehensive MRSA colonization surveillance program. It is not intended to diagnose MRSA infection nor to guide or monitor treatment for MRSA infections.   C difficile quick scan w PCR reflex     Status: None   Collection Time: 01/08/16  3:56 AM  Result Value Ref Range Status   C Diff antigen NEGATIVE NEGATIVE Final   C Diff toxin NEGATIVE NEGATIVE Final   C Diff interpretation No C. difficile detected.  Final  Gastrointestinal Panel by PCR , Stool     Status: None   Collection Time: 01/08/16  3:56 AM  Result Value Ref Range Status   Campylobacter species NOT DETECTED NOT DETECTED Final   Plesimonas shigelloides NOT DETECTED NOT DETECTED Final   Salmonella species NOT DETECTED NOT DETECTED Final   Yersinia enterocolitica NOT DETECTED NOT DETECTED Final   Vibrio species NOT DETECTED NOT DETECTED Final   Vibrio cholerae NOT DETECTED NOT DETECTED Final   Enteroaggregative E coli (EAEC) NOT DETECTED NOT DETECTED Final   Enteropathogenic E coli (EPEC) NOT DETECTED NOT DETECTED Final   Enterotoxigenic E  coli (ETEC) NOT DETECTED NOT DETECTED Final   Shiga like toxin producing E coli (STEC) NOT DETECTED NOT DETECTED Final   Shigella/Enteroinvasive E coli (EIEC) NOT DETECTED NOT DETECTED Final   Cryptosporidium NOT DETECTED NOT DETECTED Final   Cyclospora cayetanensis NOT DETECTED NOT DETECTED Final   Entamoeba histolytica NOT DETECTED NOT DETECTED Final   Giardia lamblia NOT DETECTED NOT DETECTED Final   Adenovirus F40/41 NOT DETECTED NOT DETECTED Final   Astrovirus NOT DETECTED NOT DETECTED Final   Norovirus GI/GII NOT DETECTED NOT DETECTED Final   Rotavirus A NOT DETECTED NOT DETECTED Final   Sapovirus (I, II, IV, and V) NOT DETECTED NOT DETECTED Final    Radiology Reports Dg Chest 2 View  Result Date: 12/18/2015 CLINICAL DATA:  Midline upper and lower chest pain for 3 days with sweats. EXAM: CHEST  2 VIEW COMPARISON:  03/25/2009 FINDINGS: The lungs are clear wiithout focal pneumonia, edema, pneumothorax or pleural effusion. The cardiopericardial silhouette is within normal limits for size. The visualized bony structures of the thorax are intact. IMPRESSION: No active cardiopulmonary disease. Electronically Signed   By: Misty Stanley M.D.   On: 12/18/2015 12:45   US Renal  Result Date: 01/08/2016 CLINICAL DATA:  Acute renal failure EXAM: RENAL / URINARY TRACT ULTRASOUND COMPLETE COMPARISON:  CT scan of the abdomen and pelvis of May 01, 2008 FINDINGS: Right Kidney: Length: 11.9 cm. The renal cortical echotexture remains lower than that of the adjacent liver. There is no hydronephrosis. There is no focal mass nor evidence of stones. Left Kidney: Length: 11.9 cm. The renal cortical echotexture is similar to that of the right kidney. There is a mid to lower pole cyst measuring 1.9 cm in greatest dimension. There is no hydronephrosis, solid mass, nor evidence of stones. Bladder: The partially distended urinary bladder is normal. IMPRESSION: No acute or significant chronic abnormality of the  kidneys or urinary bladder. Electronically Signed   By: David  Martinique M.D.   On: 01/08/2016 10:05   Dg Chest Port 1 View  Result Date: 01/07/2016 CLINICAL DATA:  Hypotension and lightheadedness. EXAM: PORTABLE CHEST 1 VIEW COMPARISON:  12/18/2015 FINDINGS: Mild bibasilar atelectasis. No acute consolidation or effusion. Stable borderline cardiomegaly. Pulmonary vascularity within normal limits. No pneumothorax. IMPRESSION: Minimal bibasilar atelectasis Electronically Signed   By: Donavan Foil M.D.   On: 01/07/2016  15:20     CBC  Recent Labs Lab 01/07/16 1415 01/07/16 2130 01/09/16 0206 01/09/16 0718  WBC 8.7 7.8 6.2 6.5  HGB 12.3* 10.9* 11.4* 11.4*  HCT 34.8* 30.9* 32.9* 32.6*  PLT 105* 115* 120* PENDING  MCV 86.8 86.1 86.8 86.7  MCH 30.7 30.4 30.1 30.3  MCHC 35.3 35.3 34.7 35.0  RDW 14.3 14.3 14.1 14.0  LYMPHSABS 2.3  --   --   --   MONOABS 0.7  --   --   --   EOSABS 0.2  --   --   --   BASOSABS 0.0  --   --   --     Chemistries   Recent Labs Lab 01/07/16 1415 01/07/16 2130 01/08/16 0555 01/09/16 0206 01/09/16 0718  NA 133*  --  138 138  --   K 2.3*  --  2.3* 2.7*  --   CL 98*  --  106 98*  --   CO2 15*  --  20* 30  --   GLUCOSE 122*  --  96 102*  --   BUN 90*  --  74* 42*  --   CREATININE 8.37* 6.34* 4.32* 1.70*  --   CALCIUM 8.2*  --  7.6* 7.8*  --   MG  --   --  1.9  --  1.3*  AST 73*  --   --  63*  --   ALT 105*  --   --  77*  --   ALKPHOS 79  --   --  85  --   BILITOT 0.3  --   --  0.8  --    ------------------------------------------------------------------------------------------------------------------ estimated creatinine clearance is 45.4 mL/min (by C-G formula based on SCr of 1.7 mg/dL (H)). ------------------------------------------------------------------------------------------------------------------ No results for input(s): HGBA1C in the last 72  hours. ------------------------------------------------------------------------------------------------------------------ No results for input(s): CHOL, HDL, LDLCALC, TRIG, CHOLHDL, LDLDIRECT in the last 72 hours. ------------------------------------------------------------------------------------------------------------------  Recent Labs  01/08/16 0555  TSH 0.354   ------------------------------------------------------------------------------------------------------------------ No results for input(s): VITAMINB12, FOLATE, FERRITIN, TIBC, IRON, RETICCTPCT in the last 72 hours.  Coagulation profile No results for input(s): INR, PROTIME in the last 168 hours.  No results for input(s): DDIMER in the last 72 hours.  Cardiac Enzymes No results for input(s): CKMB, TROPONINI, MYOGLOBIN in the last 168 hours.  Invalid input(s): CK ------------------------------------------------------------------------------------------------------------------ Invalid input(s): POCBNP   CBG:  Recent Labs Lab 01/07/16 1459 01/07/16 2052  GLUCAP 114* 107*       Studies: US Renal  Result Date: 01/08/2016 CLINICAL DATA:  Acute renal failure EXAM: RENAL / URINARY TRACT ULTRASOUND COMPLETE COMPARISON:  CT scan of the abdomen and pelvis of May 01, 2008 FINDINGS: Right Kidney: Length: 11.9 cm. The renal cortical echotexture remains lower than that of the adjacent liver. There is no hydronephrosis. There is no focal mass nor evidence of stones. Left Kidney: Length: 11.9 cm. The renal cortical echotexture is similar to that of the right kidney. There is a mid to lower pole cyst measuring 1.9 cm in greatest dimension. There is no hydronephrosis, solid mass, nor evidence of stones. Bladder: The partially distended urinary bladder is normal. IMPRESSION: No acute or significant chronic abnormality of the kidneys or urinary bladder. Electronically Signed   By: David  Martinique M.D.   On: 01/08/2016 10:05   Dg  Chest Port 1 View  Result Date: 01/07/2016 CLINICAL DATA:  Hypotension and lightheadedness. EXAM: PORTABLE CHEST 1 VIEW COMPARISON:  12/18/2015 FINDINGS: Mild bibasilar atelectasis. No acute  consolidation or effusion. Stable borderline cardiomegaly. Pulmonary vascularity within normal limits. No pneumothorax. IMPRESSION: Minimal bibasilar atelectasis Electronically Signed   By: Donavan Foil M.D.   On: 01/07/2016 15:20      Lab Results  Component Value Date   HGBA1C  05/02/2008    5.8 (NOTE)   The ADA recommends the following therapeutic goal for glycemic   control related to Hgb A1C measurement:   Goal of Therapy:   < 7.0% Hgb A1C   Reference: American Diabetes Association: Clinical Practice   Recommendations 2008, Diabetes Care,  2008, 31:(Suppl 1).   Lab Results  Component Value Date   LDLCALC 46 12/18/2015   CREATININE 1.70 (H) 01/09/2016       Scheduled Meds: . aspirin  81 mg Oral Daily  . atorvastatin  80 mg Oral q1800  . feeding supplement  1 Container Oral QHS  . heparin  5,000 Units Subcutaneous Q8H  . magnesium sulfate 1 - 4 g bolus IVPB  2 g Intravenous Once  . potassium chloride  10 mEq Intravenous Q1 Hr x 4  . potassium chloride  40 mEq Oral QID  . sodium chloride flush  3 mL Intravenous Q12H  . ticagrelor  90 mg Oral BID   Continuous Infusions: . sodium chloride 50 mL/hr at 01/09/16 0859     LOS: 2 days    Time spent: >30 MINS    [facility]  [facility] Pager [phone]. If 7PM-7AM, please contact night-coverage at [email or web address], password [facility] 01/09/2016, 9:36 AM  LOS: 2 days

## 2016-01-09 NOTE — Progress Notes (Signed)
S: feel better.  Wants to eat.  Complains of feeling thirsty O:BP (!) 167/91 (BP Location: Left Arm)   Pulse 87   Temp 98.8 F (37.1 C) (Oral)   Resp (!) 28   Ht 5' 6" (1.676 m)   Wt 77.9 kg (171 lb 11.8 oz)   SpO2 97%   BMI 27.72 kg/m   Intake/Output Summary (Last 24 hours) at 01/09/16 0740 Last data filed at 01/09/16 0600  Gross per 24 hour  Intake          2691.25 ml  Output             2775 ml  Net           -83.75 ml   Weight change: -5.562 kg (-12 lb 4.2 oz) TCY:ELYHT and alert CVS:RRR Resp:clear Abd:+ BS NTND Ext:No edema NEURO:CNI Ox3 no asterixis   . aspirin  81 mg Oral Daily  . atorvastatin  80 mg Oral q1800  . feeding supplement  1 Container Oral QHS  . heparin  5,000 Units Subcutaneous Q8H  . potassium chloride  10 mEq Intravenous Q1 Hr x 4  . potassium chloride  40 mEq Oral TID  . sodium chloride flush  3 mL Intravenous Q12H  . ticagrelor  90 mg Oral BID   US Renal  Result Date: 01/08/2016 CLINICAL DATA:  Acute renal failure EXAM: RENAL / URINARY TRACT ULTRASOUND COMPLETE COMPARISON:  CT scan of the abdomen and pelvis of May 01, 2008 FINDINGS: Right Kidney: Length: 11.9 cm. The renal cortical echotexture remains lower than that of the adjacent liver. There is no hydronephrosis. There is no focal mass nor evidence of stones. Left Kidney: Length: 11.9 cm. The renal cortical echotexture is similar to that of the right kidney. There is a mid to lower pole cyst measuring 1.9 cm in greatest dimension. There is no hydronephrosis, solid mass, nor evidence of stones. Bladder: The partially distended urinary bladder is normal. IMPRESSION: No acute or significant chronic abnormality of the kidneys or urinary bladder. Electronically Signed   By: David  Martinique M.D.   On: 01/08/2016 10:05   Dg Chest Port 1 View  Result Date: 01/07/2016 CLINICAL DATA:  Hypotension and lightheadedness. EXAM: PORTABLE CHEST 1 VIEW COMPARISON:  12/18/2015 FINDINGS: Mild bibasilar  atelectasis. No acute consolidation or effusion. Stable borderline cardiomegaly. Pulmonary vascularity within normal limits. No pneumothorax. IMPRESSION: Minimal bibasilar atelectasis Electronically Signed   By: Donavan Foil M.D.   On: 01/07/2016 15:20   BMET    Component Value Date/Time   NA 138 01/09/2016 0206   K 2.7 (LL) 01/09/2016 0206   CL 98 (L) 01/09/2016 0206   CO2 30 01/09/2016 0206   GLUCOSE 102 (H) 01/09/2016 0206   BUN 42 (H) 01/09/2016 0206   CREATININE 1.70 (H) 01/09/2016 0206   CALCIUM 7.8 (L) 01/09/2016 0206   GFRNONAA 42 (L) 01/09/2016 0206   GFRAA 49 (L) 01/09/2016 0206   CBC    Component Value Date/Time   WBC 6.2 01/09/2016 0206   RBC 3.79 (L) 01/09/2016 0206   HGB 11.4 (L) 01/09/2016 0206   HCT 32.9 (L) 01/09/2016 0206   PLT 120 (L) 01/09/2016 0206   MCV 86.8 01/09/2016 0206   MCH 30.1 01/09/2016 0206   MCHC 34.7 01/09/2016 0206   RDW 14.1 01/09/2016 0206   LYMPHSABS 2.3 01/07/2016 1415   MONOABS 0.7 01/07/2016 1415   EOSABS 0.2 01/07/2016 1415   BASOSABS 0.0 01/07/2016 1415     Assessment:  1. ARF sec ATN from hypotension.  Renal US unremarkable. Urine benign except for small amt of protein which is probably due to ARF 2. Hypokalemia 3. Met acidosis, improved 4. Hx HTN  SP Rt renal art stent years ago in [place] 5. CAD  Plan: 1. Replace K as you are doing 2.  Dc IV Bicarb 3. Change to heart healthy diet 4.  Renal fx should return to baseline and he should be fine to FU with PCP.  Will sign off   Waylin Dorko T

## 2016-01-10 DIAGNOSIS — K921 Melena: Secondary | ICD-10-CM

## 2016-01-10 DIAGNOSIS — K6289 Other specified diseases of anus and rectum: Secondary | ICD-10-CM

## 2016-01-10 DIAGNOSIS — K591 Functional diarrhea: Secondary | ICD-10-CM

## 2016-01-10 LAB — CBC
HCT: 32.9 % — ABNORMAL LOW (ref 39.0–52.0)
Hemoglobin: 11.2 g/dL — ABNORMAL LOW (ref 13.0–17.0)
MCH: 30 pg (ref 26.0–34.0)
MCHC: 34 g/dL (ref 30.0–36.0)
MCV: 88.2 fL (ref 78.0–100.0)
Platelets: 119 10*3/uL — ABNORMAL LOW (ref 150–400)
RBC: 3.73 MIL/uL — ABNORMAL LOW (ref 4.22–5.81)
RDW: 14 % (ref 11.5–15.5)
WBC: 6.9 10*3/uL (ref 4.0–10.5)

## 2016-01-10 LAB — COMPREHENSIVE METABOLIC PANEL
ALT: 105 U/L — ABNORMAL HIGH (ref 17–63)
AST: 85 U/L — ABNORMAL HIGH (ref 15–41)
Albumin: 2.4 g/dL — ABNORMAL LOW (ref 3.5–5.0)
Alkaline Phosphatase: 80 U/L (ref 38–126)
Anion gap: 11 (ref 5–15)
BUN: 16 mg/dL (ref 6–20)
CO2: 26 mmol/L (ref 22–32)
Calcium: 7.7 mg/dL — ABNORMAL LOW (ref 8.9–10.3)
Chloride: 101 mmol/L (ref 101–111)
Creatinine, Ser: 1.14 mg/dL (ref 0.61–1.24)
GFR calc Af Amer: >60 mL/min (ref >60)
GFR calc non Af Amer: >60 mL/min (ref >60)
Glucose, Bld: 121 mg/dL — ABNORMAL HIGH (ref 65–99)
Potassium: 3.2 mmol/L — ABNORMAL LOW (ref 3.5–5.1)
Sodium: 138 mmol/L (ref 135–145)
Total Bilirubin: 1 mg/dL (ref 0.3–1.2)
Total Protein: 6 g/dL — ABNORMAL LOW (ref 6.5–8.1)

## 2016-01-10 LAB — MAGNESIUM: Magnesium: 1.5 mg/dL — ABNORMAL LOW (ref 1.7–2.4)

## 2016-01-10 LAB — HEPATITIS B SURFACE ANTIGEN: Hepatitis B Surface Ag: NEGATIVE

## 2016-01-10 LAB — HEPATITIS C ANTIBODY: HCV Ab: 0.1 s/co ratio (ref 0.0–0.9)

## 2016-01-10 LAB — HEPATITIS B SURFACE ANTIBODY,QUALITATIVE: Hep B S Ab: NONREACTIVE

## 2016-01-10 MED ORDER — POTASSIUM CHLORIDE CRYS ER 20 MEQ PO TBCR
30.0000 meq | EXTENDED_RELEASE_TABLET | Freq: Once | ORAL | Status: AC
  Administered 2016-01-10: 30 meq via ORAL
  Filled 2016-01-10: qty 1

## 2016-01-10 MED ORDER — SODIUM CHLORIDE 0.9 % IV SOLN
INTRAVENOUS | Status: DC
  Administered 2016-01-10 (×2): via INTRAVENOUS
  Filled 2016-01-10 (×5): qty 1000

## 2016-01-10 MED ORDER — MAGNESIUM SULFATE 2 GM/50ML IV SOLN
2.0000 g | Freq: Once | INTRAVENOUS | Status: AC
  Administered 2016-01-10: 2 g via INTRAVENOUS
  Filled 2016-01-10: qty 50

## 2016-01-10 MED ORDER — POTASSIUM CHLORIDE CRYS ER 20 MEQ PO TBCR
40.0000 meq | EXTENDED_RELEASE_TABLET | Freq: Three times a day (TID) | ORAL | Status: DC
  Administered 2016-01-10 (×3): 40 meq via ORAL
  Filled 2016-01-10 (×3): qty 2

## 2016-01-10 NOTE — Progress Notes (Signed)
    Progress Note  Assessment / Plan:    Assessment: *  Bloody stool, painless.  Rectal mass on digital exam. Negative stool pathogen so doubt watery stools are infectious.   *  9/21 tandem DES and angioplasties.  Non STEMI at presentation. On 81 ASA, Brlinita. Brilinta now discontinued, last dose 0900 01/09/16    *  Hiccups.  No previous hx or sxs to suggest GERD. Continue today  *  Thrombocytopenia.   *  AKI.  Hard to correct hypokalemia.  Hypomagnesemia.    *  Remote hx crack cocaine addiction. Hepatitis labs negative  Plan: 1. Colonoscopy planned in the near future, possibly Monday, discussed this with the patient. Again patient will be high risk for this procedure. 2. Continue to correct hypokalemia 3. Will discuss above with Dr. Carlean Purl, please await any further recommendations  Thank you for your kind consultation we will continue to follow.   LOS: 3 days   Levin Erp  01/10/2016, 9:29 AM  Pager # [phone]    [facility] Attending   I have taken an interval history, reviewed the chart and examined the patient. I agree with the Advanced Practitioner's note, impression and recommendations.   Improving - creatinine now NL Will allow supportive care 1 more day and then prep for colonoscopy tomorrow w/ plans to have Dr. Havery Moros do Monday.  Gatha Mayer, MD, [facility] [phone] (pager) [phone] after 5 PM, weekends and holidays  01/10/2016 2:28 PM   Subjective  Chief Complaint: Bloody stools, rectal mass on digital exam  This morning, the patient tells me that he continues with hiccups which made his abdomen sore, descending going on for the past 4-5 days, this is given him a decreased appetite. He does continue with bright red blood per rectum, even without a bowel movement. He has been passing stool. He denies any complaints.    Objective   Vital signs in last 24 hours: Temp:  [97.8 F (36.6 C)-98.5 F (36.9  C)] 97.8 F (36.6 C) (10/14 0407) Pulse Rate:  [69-98] 98 (10/14 0826) Resp:  [16-30] 27 (10/14 0826) BP: (129-166)/(85-106) 147/106 (10/14 0826) SpO2:  [93 %-97 %] 93 % (10/14 0826) Last BM Date: 01/09/16 General: Caucasian  male in NAD Heart:  Regular rate and rhythm; no murmurs Lungs: Respirations even and unlabored, lungs CTA bilaterally Abdomen:  Soft, nontender and nondistended. Normal bowel sounds. Extremities:  Without edema. Neurologic:  Alert and oriented,  grossly normal neurologically. Psych:  Cooperative. Normal mood and affect.  Intake/Output from previous day: 10/13 0701 - 10/14 0700 In: 1675 [I.V.:1575; IV Piggyback:100] Out: 1750 [Urine:1750]  Lab Results:  Recent Labs  01/09/16 1623 01/09/16 2207 01/10/16 0431  WBC 6.9 6.3 6.9  HGB 12.4* 11.0* 11.2*  HCT 35.1* 32.1* 32.9*  PLT 117* 111* 119*   BMET  Recent Labs  01/08/16 0555 01/09/16 0206 01/10/16 0431  NA 138 138 138  K 2.3* 2.7* 3.2*  CL 106 98* 101  CO2 20* 30 26  GLUCOSE 96 102* 121*  BUN 74* 42* 16  CREATININE 4.32* 1.70* 1.14  CALCIUM 7.6* 7.8* 7.7*   LFT  Recent Labs  01/10/16 0431  PROT 6.0*  ALBUMIN 2.4*  AST 85*  ALT 105*  ALKPHOS 80  BILITOT 1.0

## 2016-01-10 NOTE — Progress Notes (Signed)
[facility] PROGRESS NOTE  [name] [id number] DOB: October 28, 1955 DOA: 01/07/2016   PCP: Ricke Hey, MD     Assessment/Plan: Principal Problem:   Acute renal failure (ARF) ([place]) Active Problems:   Hypokalemia   Hypotension   Hypothermia   Renovascular hypertension   History of non-ST elevation myocardial infarction (NSTEMI)   Tobacco use   Diarrhea   Dyslipidemia   Anuria    [name]  is a 60 y.o. 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 He presented to his PCP on the morning of 12/18/2015 with chest pain and NSTEMI, underwent coronary angiography the following morning revealing high-grade proximal LAD stenosis for which he underwent successful angioplasty. 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 His blood pressure has been high and uncontrolled. He had been compliant with his medications.  He was discharged home on 12/19/2015, states that he felt well until 4-5 days ago he started having frequent diarrhea.EMS reported BP 74/42, HR 50's, 97% on RA, and gave a NS bolus en route.rectal temperature of 95.92F. Creatinine was 8.37 (from recent 1.09) with an anion gap metabolic acidosis and normal lactate. WBC 8.7. K 2.3. Patient developed melena on 8/13, GI consulted  Assessment and plan Acute renal failure: Cr >8 from baseline 1.1-1.2. Prerenal insult due to profound hypoperfusion from hypovolemia and antihypertensives. Also worsened by ACE inhibitor/HCTZ. - Nephrology consulted, patient has been on an ACE inhibitor, had an angiogram on 12/18/15. Preprocedure creatinine was 1.08. Hypotensive on arrival. Started on isotonic bicarbonate solution, now discontinued Needing aggressive potassium/magnesium replacement, Creatinine improved from 8.37>1.7>1.14 Nephrology  has signed off  Melena  Likely ischemic colitis, holding DAPT, mild stable thrombocytopenia, discussed with Dr Gardiner Fanti is aware of the plan, of holding DAPT Ok to proceed with sedation, for GI intervention PPI discontinued by GI [facility] consulted -Needs colonoscopy vs flex sig and possibly EGD as well    Hypotension: Due to hypovolemia, resolved.   Due to GI losses. With hypoperfusion and diarrhea, concerned for ischemic colitis but no abd pain and no blood in stool. With hypothermia, given broad-spectrum abx in ED, though lactate normal and doubt sepsis. Status post aggressive fluid resuscitation  -TSH and cortisol within normal limits Blood cultures no growth so far, received Zosyn/vancomycin on 10/11  hiccups  Started baclofen , increase dose to 10 mg tid   Diarrhea: FOBT positive   - GIPP, C. diff  Hypokalemia/hypomagnesemia:  Continue to replete potassium/magnesium Repeat labs today  tomorrow  History of NSTEMI/CAD s/p PTCA and stenting of proximal/mid LAD w/DES x2: Last month by Dr. Einar Gip Appreciate Dr. Irven Shelling  Consult recommendations Holding  DAPT due to melena - NTG SL prn chest pain only.   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 - Hold hypertensives  Tobacco use disorder: Quit x2 weeks, placed on zyban at recent discharge - Hold zyban  Dyslipidemia: HDL 17 - Continue statin  Depression/anxiety:  - Hold sedating medications   DVT prophylaxsis heparin  Code Status:  Full code    Family Communication: Discussed in detail with the patient, all imaging results, lab results explained to the patient   Disposition Plan:  Continue stepdown pending GI evaluation    Consultants:  Nephrology  Cardiology    Procedures:  None  Antibiotics: Anti-infectives    Start  Dose/Rate Route Frequency Ordered Stop   01/07/16 1600  vancomycin (VANCOCIN) 1,750 mg in sodium chloride 0.9 % 500 mL IVPB     1,750 mg 250 mL/hr over 120 Minutes  Intravenous  Once 01/07/16 1521 01/07/16 1849   01/07/16 1530  piperacillin-tazobactam (ZOSYN) IVPB 3.375 g     3.375 g 100 mL/hr over 30 Minutes Intravenous  Once 01/07/16 1521 01/07/16 1606         HPI/Subjective: continues with hiccups   Objective: Vitals:   01/09/16 2000 01/09/16 2031 01/09/16 2350 01/10/16 0407  BP:  (!) 157/86 (!) 143/85 129/90  Pulse: 84 80 78 92  Resp: 20 (!) 23 (!) 22 (!) 30  Temp:  98.4 F (36.9 C) 98.5 F (36.9 C) 97.8 F (36.6 C)  TempSrc:  Oral Oral Oral  SpO2: 97% 95% 94% 96%  Weight:      Height:        Intake/Output Summary (Last 24 hours) at 01/10/16 0813 Last data filed at 01/10/16 0500  Gross per 24 hour  Intake             1525 ml  Output             1750 ml  Net             -225 ml    Exam:  Examination:  General exam: Appears calm and comfortable  Respiratory system: Clear to auscultation. Respiratory effort normal. Cardiovascular system: S1 & S2 heard, RRR. No JVD, murmurs, rubs, gallops or clicks. No pedal edema. Gastrointestinal system: Abdomen is nondistended, soft and nontender. No organomegaly or masses felt. Normal bowel sounds heard. Central nervous system: Alert and oriented. No focal neurological deficits. Extremities: Symmetric 5 x 5 power. Skin: No rashes, lesions or ulcers Psychiatry: Judgement and insight appear normal. Mood & affect appropriate.     Data Reviewed: I have personally reviewed following labs and imaging studies  Micro Results Recent Results (from the past 240 hour(s))  Blood Culture (routine x 2)     Status: None (Preliminary result)   Collection Time: 01/07/16  2:22 PM  Result Value Ref Range Status   Specimen Description BLOOD LEFT ANTECUBITAL  Final   Special Requests BOTTLES DRAWN AEROBIC AND ANAEROBIC 5CC  Final   Culture NO GROWTH 2 DAYS  Final   Report Status PENDING  Incomplete  Urine culture     Status: None   Collection Time: 01/07/16  4:44 PM  Result Value Ref Range Status    Specimen Description URINE, CLEAN CATCH  Final   Special Requests NONE  Final   Culture NO GROWTH  Final   Report Status 01/08/2016 FINAL  Final  Blood Culture (routine x 2)     Status: None (Preliminary result)   Collection Time: 01/07/16  4:56 PM  Result Value Ref Range Status   Specimen Description BLOOD UNK  Final   Special Requests BOTTLES DRAWN AEROBIC AND ANAEROBIC 5CC  Final   Culture NO GROWTH 2 DAYS  Final   Report Status PENDING  Incomplete  MRSA PCR Screening     Status: None   Collection Time: 01/07/16  8:55 PM  Result Value Ref Range Status   MRSA by PCR NEGATIVE NEGATIVE Final    Comment:        The GeneXpert MRSA Assay (FDA approved for NASAL specimens only), is one component of a comprehensive MRSA colonization surveillance program. It is not intended to diagnose MRSA infection nor to guide or monitor  treatment for MRSA infections.   C difficile quick scan w PCR reflex     Status: None   Collection Time: 01/08/16  3:56 AM  Result Value Ref Range Status   C Diff antigen NEGATIVE NEGATIVE Final   C Diff toxin NEGATIVE NEGATIVE Final   C Diff interpretation No C. difficile detected.  Final  Gastrointestinal Panel by PCR , Stool     Status: None   Collection Time: 01/08/16  3:56 AM  Result Value Ref Range Status   Campylobacter species NOT DETECTED NOT DETECTED Final   Plesimonas shigelloides NOT DETECTED NOT DETECTED Final   Salmonella species NOT DETECTED NOT DETECTED Final   Yersinia enterocolitica NOT DETECTED NOT DETECTED Final   Vibrio species NOT DETECTED NOT DETECTED Final   Vibrio cholerae NOT DETECTED NOT DETECTED Final   Enteroaggregative E coli (EAEC) NOT DETECTED NOT DETECTED Final   Enteropathogenic E coli (EPEC) NOT DETECTED NOT DETECTED Final   Enterotoxigenic E coli (ETEC) NOT DETECTED NOT DETECTED Final   Shiga like toxin producing E coli (STEC) NOT DETECTED NOT DETECTED Final   Shigella/Enteroinvasive E coli (EIEC) NOT DETECTED NOT  DETECTED Final   Cryptosporidium NOT DETECTED NOT DETECTED Final   Cyclospora cayetanensis NOT DETECTED NOT DETECTED Final   Entamoeba histolytica NOT DETECTED NOT DETECTED Final   Giardia lamblia NOT DETECTED NOT DETECTED Final   Adenovirus F40/41 NOT DETECTED NOT DETECTED Final   Astrovirus NOT DETECTED NOT DETECTED Final   Norovirus GI/GII NOT DETECTED NOT DETECTED Final   Rotavirus A NOT DETECTED NOT DETECTED Final   Sapovirus (I, II, IV, and V) NOT DETECTED NOT DETECTED Final    Radiology Reports Dg Chest 2 View  Result Date: 12/18/2015 CLINICAL DATA:  Midline upper and lower chest pain for 3 days with sweats. EXAM: CHEST  2 VIEW COMPARISON:  03/25/2009 FINDINGS: The lungs are clear wiithout focal pneumonia, edema, pneumothorax or pleural effusion. The cardiopericardial silhouette is within normal limits for size. The visualized bony structures of the thorax are intact. IMPRESSION: No active cardiopulmonary disease. Electronically Signed   By: Misty Stanley M.D.   On: 12/18/2015 12:45   US Renal  Result Date: 01/08/2016 CLINICAL DATA:  Acute renal failure EXAM: RENAL / URINARY TRACT ULTRASOUND COMPLETE COMPARISON:  CT scan of the abdomen and pelvis of May 01, 2008 FINDINGS: Right Kidney: Length: 11.9 cm. The renal cortical echotexture remains lower than that of the adjacent liver. There is no hydronephrosis. There is no focal mass nor evidence of stones. Left Kidney: Length: 11.9 cm. The renal cortical echotexture is similar to that of the right kidney. There is a mid to lower pole cyst measuring 1.9 cm in greatest dimension. There is no hydronephrosis, solid mass, nor evidence of stones. Bladder: The partially distended urinary bladder is normal. IMPRESSION: No acute or significant chronic abnormality of the kidneys or urinary bladder. Electronically Signed   By: David  Martinique M.D.   On: 01/08/2016 10:05   Dg Chest Port 1 View  Result Date: 01/07/2016 CLINICAL DATA:  Hypotension  and lightheadedness. EXAM: PORTABLE CHEST 1 VIEW COMPARISON:  12/18/2015 FINDINGS: Mild bibasilar atelectasis. No acute consolidation or effusion. Stable borderline cardiomegaly. Pulmonary vascularity within normal limits. No pneumothorax. IMPRESSION: Minimal bibasilar atelectasis Electronically Signed   By: Donavan Foil M.D.   On: 01/07/2016 15:20     CBC  Recent Labs Lab 01/07/16 1415  01/09/16 0718 01/09/16 1036 01/09/16 1623 01/09/16 2207 01/10/16 0431  WBC 8.7  < >  6.5 5.9 6.9 6.3 6.9  HGB 12.3*  < > 11.4* 11.5* 12.4* 11.0* 11.2*  HCT 34.8*  < > 32.6* 32.5* 35.1* 32.1* 32.9*  PLT 105*  < > 113* 119* 117* 111* 119*  MCV 86.8  < > 86.7 86.7 87.3 88.2 88.2  MCH 30.7  < > 30.3 30.7 30.8 30.2 30.0  MCHC 35.3  < > 35.0 35.4 35.3 34.3 34.0  RDW 14.3  < > 14.0 13.8 13.8 13.9 14.0  LYMPHSABS 2.3  --   --   --   --   --   --   MONOABS 0.7  --   --   --   --   --   --   EOSABS 0.2  --   --   --   --   --   --   BASOSABS 0.0  --   --   --   --   --   --   < > = values in this interval not displayed.  Chemistries   Recent Labs Lab 01/07/16 1415 01/07/16 2130 01/08/16 0555 01/09/16 0206 01/09/16 0718 01/10/16 0431  NA 133*  --  138 138  --  138  K 2.3*  --  2.3* 2.7*  --  3.2*  CL 98*  --  106 98*  --  101  CO2 15*  --  20* 30  --  26  GLUCOSE 122*  --  96 102*  --  121*  BUN 90*  --  74* 42*  --  16  CREATININE 8.37* 6.34* 4.32* 1.70*  --  1.14  CALCIUM 8.2*  --  7.6* 7.8*  --  7.7*  MG  --   --  1.9  --  1.3* 1.5*  AST 73*  --   --  63*  --  85*  ALT 105*  --   --  77*  --  105*  ALKPHOS 79  --   --  85  --  80  BILITOT 0.3  --   --  0.8  --  1.0   ------------------------------------------------------------------------------------------------------------------ estimated creatinine clearance is 67.6 mL/min (by C-G formula based on SCr of 1.14 mg/dL). ------------------------------------------------------------------------------------------------------------------ No  results for input(s): HGBA1C in the last 72 hours. ------------------------------------------------------------------------------------------------------------------ No results for input(s): CHOL, HDL, LDLCALC, TRIG, CHOLHDL, LDLDIRECT in the last 72 hours. ------------------------------------------------------------------------------------------------------------------  Recent Labs  01/08/16 0555  TSH 0.354   ------------------------------------------------------------------------------------------------------------------ No results for input(s): VITAMINB12, FOLATE, FERRITIN, TIBC, IRON, RETICCTPCT in the last 72 hours.  Coagulation profile No results for input(s): INR, PROTIME in the last 168 hours.  No results for input(s): DDIMER in the last 72 hours.  Cardiac Enzymes No results for input(s): CKMB, TROPONINI, MYOGLOBIN in the last 168 hours.  Invalid input(s): CK ------------------------------------------------------------------------------------------------------------------ Invalid input(s): POCBNP   CBG:  Recent Labs Lab 01/07/16 1459 01/07/16 2052  GLUCAP 114* 107*       Studies: US Renal  Result Date: 01/08/2016 CLINICAL DATA:  Acute renal failure EXAM: RENAL / URINARY TRACT ULTRASOUND COMPLETE COMPARISON:  CT scan of the abdomen and pelvis of May 01, 2008 FINDINGS: Right Kidney: Length: 11.9 cm. The renal cortical echotexture remains lower than that of the adjacent liver. There is no hydronephrosis. There is no focal mass nor evidence of stones. Left Kidney: Length: 11.9 cm. The renal cortical echotexture is similar to that of the right kidney. There is a mid to lower pole cyst measuring 1.9 cm in greatest dimension. There is no  hydronephrosis, solid mass, nor evidence of stones. Bladder: The partially distended urinary bladder is normal. IMPRESSION: No acute or significant chronic abnormality of the kidneys or urinary bladder. Electronically Signed   By: David   Martinique M.D.   On: 01/08/2016 10:05      Lab Results  Component Value Date   HGBA1C  05/02/2008    5.8 (NOTE)   The ADA recommends the following therapeutic goal for glycemic   control related to Hgb A1C measurement:   Goal of Therapy:   < 7.0% Hgb A1C   Reference: American Diabetes Association: Clinical Practice   Recommendations 2008, Diabetes Care,  2008, 31:(Suppl 1).   Lab Results  Component Value Date   LDLCALC 46 12/18/2015   CREATININE 1.14 01/10/2016       Scheduled Meds: . atorvastatin  80 mg Oral q1800  . baclofen  5 mg Oral TID  . feeding supplement  1 Container Oral QHS  . magnesium sulfate 1 - 4 g bolus IVPB  2 g Intravenous Once  . pantoprazole  40 mg Oral Q0600  . potassium chloride  30 mEq Oral Once  . potassium chloride  40 mEq Oral TID  . sodium chloride flush  3 mL Intravenous Q12H   Continuous Infusions: . 0.9 % sodium chloride with kcl       LOS: 3 days    Time spent: >30 MINS    [facility] Pager [phone]. If 7PM-7AM, please contact night-coverage at [email or web address], password [facility] 01/10/2016, 8:13 AM  LOS: 3 days

## 2016-01-11 ENCOUNTER — Inpatient Hospital Stay (HOSPITAL_COMMUNITY): Payer: Medicaid Other

## 2016-01-11 LAB — COMPREHENSIVE METABOLIC PANEL
ALT: 126 U/L — ABNORMAL HIGH (ref 17–63)
AST: 89 U/L — ABNORMAL HIGH (ref 15–41)
Albumin: 2.2 g/dL — ABNORMAL LOW (ref 3.5–5.0)
Alkaline Phosphatase: 73 U/L (ref 38–126)
Anion gap: 5 (ref 5–15)
BUN: 9 mg/dL (ref 6–20)
CO2: 21 mmol/L — ABNORMAL LOW (ref 22–32)
Calcium: 7.6 mg/dL — ABNORMAL LOW (ref 8.9–10.3)
Chloride: 112 mmol/L — ABNORMAL HIGH (ref 101–111)
Creatinine, Ser: 0.96 mg/dL (ref 0.61–1.24)
GFR calc Af Amer: >60 mL/min (ref >60)
GFR calc non Af Amer: >60 mL/min (ref >60)
Glucose, Bld: 94 mg/dL (ref 65–99)
Potassium: 5.5 mmol/L — ABNORMAL HIGH (ref 3.5–5.1)
Sodium: 138 mmol/L (ref 135–145)
Total Bilirubin: 0.8 mg/dL (ref 0.3–1.2)
Total Protein: 5.5 g/dL — ABNORMAL LOW (ref 6.5–8.1)

## 2016-01-11 LAB — CBC
HCT: 30.8 % — ABNORMAL LOW (ref 39.0–52.0)
HCT: 29.6 % — ABNORMAL LOW (ref 39.0–52.0)
Hemoglobin: 10.4 g/dL — ABNORMAL LOW (ref 13.0–17.0)
Hemoglobin: 9.8 g/dL — ABNORMAL LOW (ref 13.0–17.0)
MCH: 30.5 pg (ref 26.0–34.0)
MCH: 30.4 pg (ref 26.0–34.0)
MCHC: 33.8 g/dL (ref 30.0–36.0)
MCHC: 33.1 g/dL (ref 30.0–36.0)
MCV: 90.3 fL (ref 78.0–100.0)
MCV: 91.9 fL (ref 78.0–100.0)
Platelets: 137 10*3/uL — ABNORMAL LOW (ref 150–400)
Platelets: 113 10*3/uL — ABNORMAL LOW (ref 150–400)
RBC: 3.41 MIL/uL — ABNORMAL LOW (ref 4.22–5.81)
RBC: 3.22 MIL/uL — ABNORMAL LOW (ref 4.22–5.81)
RDW: 14.3 % (ref 11.5–15.5)
RDW: 14.2 % (ref 11.5–15.5)
WBC: 9.3 10*3/uL (ref 4.0–10.5)
WBC: 7.2 10*3/uL (ref 4.0–10.5)

## 2016-01-11 LAB — BASIC METABOLIC PANEL
Anion gap: 4 — ABNORMAL LOW (ref 5–15)
BUN: 9 mg/dL (ref 6–20)
CO2: 21 mmol/L — ABNORMAL LOW (ref 22–32)
Calcium: 7.9 mg/dL — ABNORMAL LOW (ref 8.9–10.3)
Chloride: 112 mmol/L — ABNORMAL HIGH (ref 101–111)
Creatinine, Ser: 0.96 mg/dL (ref 0.61–1.24)
GFR calc Af Amer: >60 mL/min (ref >60)
GFR calc non Af Amer: >60 mL/min (ref >60)
Glucose, Bld: 93 mg/dL (ref 65–99)
Potassium: 5.7 mmol/L — ABNORMAL HIGH (ref 3.5–5.1)
Sodium: 137 mmol/L (ref 135–145)

## 2016-01-11 LAB — PROTIME-INR
INR: 1.18
Prothrombin Time: 15.1 seconds (ref 11.4–15.2)

## 2016-01-11 MED ORDER — PEG-KCL-NACL-NASULF-NA ASC-C 100 G PO SOLR
0.5000 | Freq: Once | ORAL | Status: AC
  Administered 2016-01-11: 100 g via ORAL
  Filled 2016-01-11: qty 1

## 2016-01-11 MED ORDER — METOPROLOL TARTRATE 12.5 MG HALF TABLET
12.5000 mg | ORAL_TABLET | Freq: Two times a day (BID) | ORAL | Status: DC
  Administered 2016-01-11 – 2016-01-13 (×5): 12.5 mg via ORAL
  Filled 2016-01-11 (×5): qty 1

## 2016-01-11 MED ORDER — BACLOFEN 10 MG PO TABS
10.0000 mg | ORAL_TABLET | Freq: Three times a day (TID) | ORAL | Status: DC
  Administered 2016-01-11 – 2016-01-13 (×7): 10 mg via ORAL
  Filled 2016-01-11 (×8): qty 1

## 2016-01-11 MED ORDER — MAGNESIUM SULFATE 2 GM/50ML IV SOLN
2.0000 g | Freq: Once | INTRAVENOUS | Status: AC
  Administered 2016-01-11: 2 g via INTRAVENOUS
  Filled 2016-01-11: qty 50

## 2016-01-11 MED ORDER — PEG-KCL-NACL-NASULF-NA ASC-C 100 G PO SOLR
0.5000 | Freq: Once | ORAL | Status: AC
  Administered 2016-01-11: 100 g via ORAL

## 2016-01-11 MED ORDER — PEG-KCL-NACL-NASULF-NA ASC-C 100 G PO SOLR: 1.0000 | Freq: Once | ORAL | Status: DC

## 2016-01-11 MED ORDER — SODIUM CHLORIDE 0.9 % IV SOLN
INTRAVENOUS | Status: DC
  Administered 2016-01-11 – 2016-01-12 (×3): via INTRAVENOUS

## 2016-01-11 MED ORDER — HYDROMORPHONE HCL 1 MG/ML IJ SOLN
1.0000 mg | INTRAMUSCULAR | Status: DC | PRN
  Administered 2016-01-11 – 2016-01-12 (×4): 1 mg via INTRAVENOUS
  Filled 2016-01-11 (×5): qty 1

## 2016-01-11 MED ORDER — POTASSIUM CHLORIDE CRYS ER 20 MEQ PO TBCR
40.0000 meq | EXTENDED_RELEASE_TABLET | Freq: Once | ORAL | Status: AC
  Administered 2016-01-11: 40 meq via ORAL
  Filled 2016-01-11: qty 2

## 2016-01-11 MED ORDER — TRAMADOL HCL 50 MG PO TABS
50.0000 mg | ORAL_TABLET | Freq: Once | ORAL | Status: AC
  Administered 2016-01-11: 50 mg via ORAL
  Filled 2016-01-11: qty 1

## 2016-01-11 MED ORDER — HYDROMORPHONE HCL 1 MG/ML IJ SOLN
0.5000 mg | Freq: Once | INTRAMUSCULAR | Status: AC
  Administered 2016-01-11: 0.5 mg via INTRAVENOUS
  Filled 2016-01-11: qty 1

## 2016-01-11 NOTE — Progress Notes (Signed)
Patient with complaints of abdominal pain accompanied increased frequency of bloody stools. MD paged and notified, will cont to monitor

## 2016-01-11 NOTE — Progress Notes (Signed)
[facility] PROGRESS NOTE  [name] [id number] DOB: 21-Apr-1955 DOA: 01/07/2016   PCP: Ricke Hey, MD     Assessment/Plan: Principal Problem:   Acute renal failure (ARF) ([place]) Active Problems:   Hypokalemia   Hypotension   Hypothermia   Renovascular hypertension   History of non-ST elevation myocardial infarction (NSTEMI)   Tobacco use   Diarrhea   Dyslipidemia   Anuria   Rectal mass   Blood in stool    [name]  is a 60 y.o. 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 He presented to his PCP on the morning of 12/18/2015 with chest pain and NSTEMI, underwent coronary angiography the following morning revealing high-grade proximal LAD stenosis for which he underwent successful angioplasty. 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 His blood pressure has been high and uncontrolled. He had been compliant with his medications.  He was discharged home on 12/19/2015, states that he felt well until 4-5 days ago he started having frequent diarrhea.EMS reported BP 74/42, HR 50's, 97% on RA, and gave a NS bolus en route.rectal temperature of 95.65F. Creatinine was 8.37 (from recent 1.09) with an anion gap metabolic acidosis and normal lactate. WBC 8.7. K 2.3. Patient developed melena on 8/13, GI consulted  Assessment and plan Acute renal failure: Cr >8 from baseline 1.1-1.2. Prerenal insult due to profound hypoperfusion from hypovolemia and antihypertensives. Also worsened by ACE inhibitor/HCTZ. - Nephrology consulted, patient has been on an ACE inhibitor, had an angiogram on 12/18/15. Preprocedure creatinine was 1.08. Hypotensive on arrival. Started on isotonic bicarbonate solution, now discontinued Needing aggressive potassium/magnesium replacement, Creatinine  improved from 8.37>1.7>1.14 Nephrology has signed off  Melena/acute blood loss anemia -this continues, patient had abdominal pain with increased frequency of bloody stools early this morning Likely ischemic colitis, holding DAPT, mild stable thrombocytopenia, discussed with Dr Gardiner Fanti is aware of the plan, of holding DAPT Ok to proceed with sedation, for GI intervention PPI discontinued by GI but restarted today [facility] consulted -GI plans for colonoscopy on Monday Hemoglobin has changed from 14.3 on 9/21 to 10.4   Hypotension:  resolved. Holding antihypertensive medications. secondary to   GI losses. With hypoperfusion and diarrhea, concerned for ischemic colitis .  Status post aggressive fluid resuscitation  -TSH and cortisol within normal limits Blood cultures no growth so far, received Zosyn/vancomycin on 10/11, for 1 day, now discontinued   hiccups  Dose of baclofen increased to 10 mg 3 times a day  Transaminitis-discontinue Lipitor, follow liver function closely, right upper quadrant ultrasound ordered by GI  Diarrhea: FOBT positive   - GIPP, C. Diff both negative  Hypokalemia/hypomagnesemia:  Continue to replete potassium/magnesium Recheck  History of NSTEMI/CAD s/p PTCA and stenting of proximal/mid LAD w/DES x2: Last month by Dr. Einar Gip Appreciate Dr. Irven Shelling  Consult recommendations Holding  DAPT due to melena, holding antihypertensive medications Restart low-dose beta blocker  Renovascular HTN: s/p R renal artery stent. Continue to hold clonidine, lisinopril/HCTZ, restart low-dose beta blocker  Tobacco use disorder: Quit x2 weeks, placed on zyban at recent discharge - Hold zyban  Dyslipidemia: HDL 17 Hold statin due to transaminitis  Depression/anxiety:  - Hold sedating medications   DVT prophylaxsis heparin  Code Status:  Full code    Family Communication: Discussed in detail with the patient, all imaging results,  lab results explained to the  patient   Disposition Plan:  Continue stepdown pending GI evaluation    Consultants:  Nephrology  Cardiology    Procedures:  None  Antibiotics: Anti-infectives    Start     Dose/Rate Route Frequency Ordered Stop   01/07/16 1600  vancomycin (VANCOCIN) 1,750 mg in sodium chloride 0.9 % 500 mL IVPB     1,750 mg 250 mL/hr over 120 Minutes Intravenous  Once 01/07/16 1521 01/07/16 1849   01/07/16 1530  piperacillin-tazobactam (ZOSYN) IVPB 3.375 g     3.375 g 100 mL/hr over 30 Minutes Intravenous  Once 01/07/16 1521 01/07/16 1606         HPI/Subjective: Patient with complaints of abdominal pain accompanied increased frequency of bloody stools   Objective: Vitals:   01/10/16 2300 01/11/16 0345 01/11/16 0700 01/11/16 0744  BP: (!) 165/109 (!) 142/105  (!) 136/93  Pulse: 92 88    Resp: (!) 21 (!) 24 13 (!) 21  Temp: 98.6 F (37 C) 97.8 F (36.6 C)  97.9 F (36.6 C)  TempSrc: Oral Oral  Oral  SpO2: 99% 97%  98%  Weight:      Height:        Intake/Output Summary (Last 24 hours) at 01/11/16 0810 Last data filed at 01/11/16 0230  Gross per 24 hour  Intake             1070 ml  Output              950 ml  Net              120 ml    Exam:  Examination:  General exam: Appears calm and comfortable  Respiratory system: Clear to auscultation. Respiratory effort normal. Cardiovascular system: S1 & S2 heard, RRR. No JVD, murmurs, rubs, gallops or clicks. No pedal edema. Gastrointestinal system: Abdomen is nondistended, soft and nontender. No organomegaly or masses felt. Normal bowel sounds heard. Central nervous system: Alert and oriented. No focal neurological deficits. Extremities: Symmetric 5 x 5 power. Skin: No rashes, lesions or ulcers Psychiatry: Judgement and insight appear normal. Mood & affect appropriate.     Data Reviewed: I have personally reviewed following labs and imaging studies  Micro Results Recent Results (from the past 240 hour(s))  Blood  Culture (routine x 2)     Status: None (Preliminary result)   Collection Time: 01/07/16  2:22 PM  Result Value Ref Range Status   Specimen Description BLOOD LEFT ANTECUBITAL  Final   Special Requests BOTTLES DRAWN AEROBIC AND ANAEROBIC 5CC  Final   Culture NO GROWTH 3 DAYS  Final   Report Status PENDING  Incomplete  Urine culture     Status: None   Collection Time: 01/07/16  4:44 PM  Result Value Ref Range Status   Specimen Description URINE, CLEAN CATCH  Final   Special Requests NONE  Final   Culture NO GROWTH  Final   Report Status 01/08/2016 FINAL  Final  Blood Culture (routine x 2)     Status: None (Preliminary result)   Collection Time: 01/07/16  4:56 PM  Result Value Ref Range Status   Specimen Description BLOOD UNK  Final   Special Requests BOTTLES DRAWN AEROBIC AND ANAEROBIC 5CC  Final   Culture NO GROWTH 3 DAYS  Final   Report Status PENDING  Incomplete  MRSA PCR Screening     Status: None   Collection Time: 01/07/16  8:55 PM  Result Value Ref Range  Status   MRSA by PCR NEGATIVE NEGATIVE Final    Comment:        The GeneXpert MRSA Assay (FDA approved for NASAL specimens only), is one component of a comprehensive MRSA colonization surveillance program. It is not intended to diagnose MRSA infection nor to guide or monitor treatment for MRSA infections.   C difficile quick scan w PCR reflex     Status: None   Collection Time: 01/08/16  3:56 AM  Result Value Ref Range Status   C Diff antigen NEGATIVE NEGATIVE Final   C Diff toxin NEGATIVE NEGATIVE Final   C Diff interpretation No C. difficile detected.  Final  Gastrointestinal Panel by PCR , Stool     Status: None   Collection Time: 01/08/16  3:56 AM  Result Value Ref Range Status   Campylobacter species NOT DETECTED NOT DETECTED Final   Plesimonas shigelloides NOT DETECTED NOT DETECTED Final   Salmonella species NOT DETECTED NOT DETECTED Final   Yersinia enterocolitica NOT DETECTED NOT DETECTED Final   Vibrio  species NOT DETECTED NOT DETECTED Final   Vibrio cholerae NOT DETECTED NOT DETECTED Final   Enteroaggregative E coli (EAEC) NOT DETECTED NOT DETECTED Final   Enteropathogenic E coli (EPEC) NOT DETECTED NOT DETECTED Final   Enterotoxigenic E coli (ETEC) NOT DETECTED NOT DETECTED Final   Shiga like toxin producing E coli (STEC) NOT DETECTED NOT DETECTED Final   Shigella/Enteroinvasive E coli (EIEC) NOT DETECTED NOT DETECTED Final   Cryptosporidium NOT DETECTED NOT DETECTED Final   Cyclospora cayetanensis NOT DETECTED NOT DETECTED Final   Entamoeba histolytica NOT DETECTED NOT DETECTED Final   Giardia lamblia NOT DETECTED NOT DETECTED Final   Adenovirus F40/41 NOT DETECTED NOT DETECTED Final   Astrovirus NOT DETECTED NOT DETECTED Final   Norovirus GI/GII NOT DETECTED NOT DETECTED Final   Rotavirus A NOT DETECTED NOT DETECTED Final   Sapovirus (I, II, IV, and V) NOT DETECTED NOT DETECTED Final    Radiology Reports Dg Chest 2 View  Result Date: 12/18/2015 CLINICAL DATA:  Midline upper and lower chest pain for 3 days with sweats. EXAM: CHEST  2 VIEW COMPARISON:  03/25/2009 FINDINGS: The lungs are clear wiithout focal pneumonia, edema, pneumothorax or pleural effusion. The cardiopericardial silhouette is within normal limits for size. The visualized bony structures of the thorax are intact. IMPRESSION: No active cardiopulmonary disease. Electronically Signed   By: Misty Stanley M.D.   On: 12/18/2015 12:45   US Renal  Result Date: 01/08/2016 CLINICAL DATA:  Acute renal failure EXAM: RENAL / URINARY TRACT ULTRASOUND COMPLETE COMPARISON:  CT scan of the abdomen and pelvis of May 01, 2008 FINDINGS: Right Kidney: Length: 11.9 cm. The renal cortical echotexture remains lower than that of the adjacent liver. There is no hydronephrosis. There is no focal mass nor evidence of stones. Left Kidney: Length: 11.9 cm. The renal cortical echotexture is similar to that of the right kidney. There is a mid to  lower pole cyst measuring 1.9 cm in greatest dimension. There is no hydronephrosis, solid mass, nor evidence of stones. Bladder: The partially distended urinary bladder is normal. IMPRESSION: No acute or significant chronic abnormality of the kidneys or urinary bladder. Electronically Signed   By: David  Martinique M.D.   On: 01/08/2016 10:05   Dg Chest Port 1 View  Result Date: 01/07/2016 CLINICAL DATA:  Hypotension and lightheadedness. EXAM: PORTABLE CHEST 1 VIEW COMPARISON:  12/18/2015 FINDINGS: Mild bibasilar atelectasis. No acute consolidation or effusion. Stable borderline cardiomegaly.  Pulmonary vascularity within normal limits. No pneumothorax. IMPRESSION: Minimal bibasilar atelectasis Electronically Signed   By: Donavan Foil M.D.   On: 01/07/2016 15:20     CBC  Recent Labs Lab 01/07/16 1415  01/09/16 1036 01/09/16 1623 01/09/16 2207 01/10/16 0431 01/11/16 0320  WBC 8.7  < > 5.9 6.9 6.3 6.9 9.3  HGB 12.3*  < > 11.5* 12.4* 11.0* 11.2* 10.4*  HCT 34.8*  < > 32.5* 35.1* 32.1* 32.9* 30.8*  PLT 105*  < > 119* 117* 111* 119* 137*  MCV 86.8  < > 86.7 87.3 88.2 88.2 90.3  MCH 30.7  < > 30.7 30.8 30.2 30.0 30.5  MCHC 35.3  < > 35.4 35.3 34.3 34.0 33.8  RDW 14.3  < > 13.8 13.8 13.9 14.0 14.3  LYMPHSABS 2.3  --   --   --   --   --   --   MONOABS 0.7  --   --   --   --   --   --   EOSABS 0.2  --   --   --   --   --   --   BASOSABS 0.0  --   --   --   --   --   --   < > = values in this interval not displayed.  Chemistries   Recent Labs Lab 01/07/16 1415 01/07/16 2130 01/08/16 0555 01/09/16 0206 01/09/16 0718 01/10/16 0431  NA 133*  --  138 138  --  138  K 2.3*  --  2.3* 2.7*  --  3.2*  CL 98*  --  106 98*  --  101  CO2 15*  --  20* 30  --  26  GLUCOSE 122*  --  96 102*  --  121*  BUN 90*  --  74* 42*  --  16  CREATININE 8.37* 6.34* 4.32* 1.70*  --  1.14  CALCIUM 8.2*  --  7.6* 7.8*  --  7.7*  MG  --   --  1.9  --  1.3* 1.5*  AST 73*  --   --  63*  --  85*  ALT 105*  --    --  77*  --  105*  ALKPHOS 79  --   --  85  --  80  BILITOT 0.3  --   --  0.8  --  1.0   ------------------------------------------------------------------------------------------------------------------ estimated creatinine clearance is 67.6 mL/min (by C-G formula based on SCr of 1.14 mg/dL). ------------------------------------------------------------------------------------------------------------------ No results for input(s): HGBA1C in the last 72 hours. ------------------------------------------------------------------------------------------------------------------ No results for input(s): CHOL, HDL, LDLCALC, TRIG, CHOLHDL, LDLDIRECT in the last 72 hours. ------------------------------------------------------------------------------------------------------------------ No results for input(s): TSH, T4TOTAL, T3FREE, THYROIDAB in the last 72 hours.  Invalid input(s): FREET3 ------------------------------------------------------------------------------------------------------------------ No results for input(s): VITAMINB12, FOLATE, FERRITIN, TIBC, IRON, RETICCTPCT in the last 72 hours.  Coagulation profile No results for input(s): INR, PROTIME in the last 168 hours.  No results for input(s): DDIMER in the last 72 hours.  Cardiac Enzymes No results for input(s): CKMB, TROPONINI, MYOGLOBIN in the last 168 hours.  Invalid input(s): CK ------------------------------------------------------------------------------------------------------------------ Invalid input(s): POCBNP   CBG:  Recent Labs Lab 01/07/16 1459 01/07/16 2052  GLUCAP 114* 107*       Studies: No results found.    Lab Results  Component Value Date   HGBA1C  05/02/2008    5.8 (NOTE)   The ADA recommends the following therapeutic goal for glycemic   control related to  Hgb A1C measurement:   Goal of Therapy:   < 7.0% Hgb A1C   Reference: American Diabetes Association: Clinical Practice   Recommendations  2008, Diabetes Care,  2008, 31:(Suppl 1).   Lab Results  Component Value Date   LDLCALC 46 12/18/2015   CREATININE 1.14 01/10/2016       Scheduled Meds: . baclofen  10 mg Oral TID  . feeding supplement  1 Container Oral QHS  . metoprolol tartrate  12.5 mg Oral BID  . pantoprazole  40 mg Oral Q0600  . potassium chloride  40 mEq Oral Once  . sodium chloride flush  3 mL Intravenous Q12H   Continuous Infusions: . 0.9 % sodium chloride with kcl 75 mL/hr at 01/11/16 0700     LOS: 4 days    Time spent: >30 MINS    [facility] Pager [phone]. If 7PM-7AM, please contact night-coverage at [email or web address], password [email or web address] 01/11/2016, 8:10 AM  LOS: 4 days

## 2016-01-11 NOTE — Progress Notes (Addendum)
Progress Note    [facility] Attending   3:47 PM Addendum:  KUB ok Hgb down another gram K 5.7 Last stool no blood  Proceed w/ colon prep  Gatha Mayer, MD, [facility] 3:48 PM'     I have taken an indeendent history, reviewed the chart and examined the patient. I agree with the Advanced Practitioner's note, impression and recommendations.   Also:  Abdominal pain and distention are new Bleeding picking up Fleshy firm anterior rectal mass K now 5.5   Cancel [place] for abnl LFT Will get AXR CBC, BMET, PT/INR Will need a CT at some point was thinking after colonoscopy but could need before now - ? If he is developing colonic obstruction from more proximal aspect of rectal mass - what I can feel is not obstructing however  Brillinta still around - Hgb not dangerously low so have not given PLT to counteract  Gatha Mayer, MD, [facility] [phone] (pager)  01/11/2016 12:13 PM     Assessment / Plan:    Assessment: * Bloody stool, painless in beginning now with abdominal distention and pain  * anterior Rectal mass on digital exam.  * 9/21 tandem DES and angioplasties. Non STEMI at presentation. On 81 ASA, Brlinita. Brilinta now discontinued, last dose 0900 01/09/16   * Hiccups. No previous hx or sxs to suggest GERD. Resolved.  * Thrombocytopenia.   * AKI. Hard to correct hypokalemia. Hypomagnesemia. Hyperkalemic today  * Remote hx crack cocaine addiction. Hepatitis labs negative  Plan: 1. Colonoscopy planned tomorrow with Dr. Havery Moros. Discussed risks, benefits, limitations and alternatives and the patient agrees to proceed. 2. Clears today, NPO after midnight 3. Will discuss above with Dr. Carlean Purl, please await any further recommendations  Thank you for your kind consultation we will continue to follow.   LOS: 4 days   Levin Erp  01/11/2016, 10:02 AM  Pager # [phone]  Subjective  Chief  Complaint:Bloody stool, rectal mass  Pt found laying in bed this morning, he tells me he has a fair amount of lower abdominal discomfort. This comes in "waves" and is worse while he is passing blood/BM. He has continued with BRBPR overnight in "quite large amounts" per nursing. His hiccups have resolved.    Objective   Vital signs in last 24 hours: Temp:  [97.8 F (36.6 C)-98.6 F (37 C)] 97.9 F (36.6 C) (10/15 0744) Pulse Rate:  [71-92] 88 (10/15 0345) Resp:  [13-30] 21 (10/15 0744) BP: (136-165)/(92-109) 136/93 (10/15 0744) SpO2:  [96 %-99 %] 98 % (10/15 0744) Last BM Date: 01/10/16 General:    white male in NAD Heart:  Regular rate and rhythm; no murmurs Lungs: Respirations even and unlabored, lungs CTA bilaterally Abdomen:  Soft, mild ttp lower abdomen and nondistended. Normal bowel sounds. Extremities:  Without edema. Neurologic:  Alert and oriented,  grossly normal neurologically. Psych:  Cooperative. Normal mood and affect.  Intake/Output from previous day: 10/14 0701 - 10/15 0700 In: 1070 [P.O.:970; I.V.:50; IV Piggyback:50] Out: 950 [Urine:950]  Lab Results:  Recent Labs  01/09/16 2207 01/10/16 0431 01/11/16 0320  WBC 6.3 6.9 9.3  HGB 11.0* 11.2* 10.4*  HCT 32.1* 32.9* 30.8*  PLT 111* 119* 137*   BMET  Recent Labs  01/09/16 0206 01/10/16 0431  NA 138 138  K 2.7* 3.2*  CL 98* 101  CO2 30 26  GLUCOSE 102* 121*  BUN 42* 16  CREATININE 1.70* 1.14  CALCIUM 7.8* 7.7*   LFT  Recent Labs  01/10/16 0431  PROT 6.0*  ALBUMIN 2.4*  AST 85*  ALT 105*  ALKPHOS 80  BILITOT 1.0

## 2016-01-11 NOTE — Progress Notes (Signed)
Patient has been resting through out the day. He has had multiple episodes of loose, bloody stool. He has tolerated the clear liquid diet with no nausea or vomiting. Pain has been controlled with PRN pain medication. Patient currently drinking prep for tomorrows colonoscopy. Consent has been signed and is in the chart. Patient is aware he will be NPO after midnight. No questions or concerns at this time. Call bell in reach, will continue to monitor.

## 2016-01-12 ENCOUNTER — Inpatient Hospital Stay (HOSPITAL_COMMUNITY): Payer: Medicaid Other | Admitting: Anesthesiology

## 2016-01-12 ENCOUNTER — Encounter (HOSPITAL_COMMUNITY)
Admission: EM | Disposition: A | Discharge status: Home / Self Care | Payer: Self-pay | Source: Home / Self Care | Attending: Internal Medicine

## 2016-01-12 ENCOUNTER — Encounter (HOSPITAL_COMMUNITY): Payer: Self-pay

## 2016-01-12 DIAGNOSIS — D125 Benign neoplasm of sigmoid colon: Secondary | ICD-10-CM

## 2016-01-12 DIAGNOSIS — K7689 Other specified diseases of liver: Secondary | ICD-10-CM

## 2016-01-12 DIAGNOSIS — R945 Abnormal results of liver function studies: Secondary | ICD-10-CM

## 2016-01-12 HISTORY — PX: COLONOSCOPY: SHX5424

## 2016-01-12 LAB — CULTURE, BLOOD (ROUTINE X 2)
Culture: NO GROWTH
Culture: NO GROWTH

## 2016-01-12 LAB — COMPREHENSIVE METABOLIC PANEL
ALT: 142 U/L — ABNORMAL HIGH (ref 17–63)
AST: 90 U/L — ABNORMAL HIGH (ref 15–41)
Albumin: 2.5 g/dL — ABNORMAL LOW (ref 3.5–5.0)
Alkaline Phosphatase: 86 U/L (ref 38–126)
Anion gap: 7 (ref 5–15)
BUN: 8 mg/dL (ref 6–20)
CO2: 18 mmol/L — ABNORMAL LOW (ref 22–32)
Calcium: 8 mg/dL — ABNORMAL LOW (ref 8.9–10.3)
Chloride: 114 mmol/L — ABNORMAL HIGH (ref 101–111)
Creatinine, Ser: 0.88 mg/dL (ref 0.61–1.24)
GFR calc Af Amer: >60 mL/min (ref >60)
GFR calc non Af Amer: >60 mL/min (ref >60)
Glucose, Bld: 108 mg/dL — ABNORMAL HIGH (ref 65–99)
Potassium: 4.9 mmol/L (ref 3.5–5.1)
Sodium: 139 mmol/L (ref 135–145)
Total Bilirubin: 0.7 mg/dL (ref 0.3–1.2)
Total Protein: 6 g/dL — ABNORMAL LOW (ref 6.5–8.1)

## 2016-01-12 LAB — CBC
HCT: 30.9 % — ABNORMAL LOW (ref 39.0–52.0)
Hemoglobin: 10 g/dL — ABNORMAL LOW (ref 13.0–17.0)
MCH: 30.1 pg (ref 26.0–34.0)
MCHC: 32.4 g/dL (ref 30.0–36.0)
MCV: 93.1 fL (ref 78.0–100.0)
Platelets: 126 10*3/uL — ABNORMAL LOW (ref 150–400)
RBC: 3.32 MIL/uL — ABNORMAL LOW (ref 4.22–5.81)
RDW: 14.5 % (ref 11.5–15.5)
WBC: 7.4 10*3/uL (ref 4.0–10.5)

## 2016-01-12 LAB — MAGNESIUM: Magnesium: 1.7 mg/dL (ref 1.7–2.4)

## 2016-01-12 LAB — GLUCOSE, CAPILLARY: Glucose-Capillary: 87 mg/dL (ref 65–99)

## 2016-01-12 SURGERY — COLONOSCOPY
Anesthesia: Monitor Anesthesia Care

## 2016-01-12 MED ORDER — PHENYLEPHRINE HCL 10 MG/ML IJ SOLN
INTRAMUSCULAR | Status: DC | PRN
  Administered 2016-01-12: 80 ug via INTRAVENOUS
  Administered 2016-01-12: 40 ug via INTRAVENOUS
  Administered 2016-01-12: 120 ug via INTRAVENOUS
  Administered 2016-01-12: 40 ug via INTRAVENOUS

## 2016-01-12 MED ORDER — SODIUM CHLORIDE 0.9 % IV SOLN: INTRAVENOUS | Status: DC

## 2016-01-12 MED ORDER — NONFORMULARY OR COMPOUNDED ITEM
1.0000 "application " | Freq: Three times a day (TID) | Status: DC
  Administered 2016-01-12 – 2016-01-13 (×2): 1 via RECTAL
  Filled 2016-01-12 (×25): qty 1

## 2016-01-12 MED ORDER — EPHEDRINE SULFATE 50 MG/ML IJ SOLN
INTRAMUSCULAR | Status: DC | PRN
  Administered 2016-01-12: 10 mg via INTRAVENOUS

## 2016-01-12 MED ORDER — ASPIRIN EC 81 MG PO TBEC
81.0000 mg | DELAYED_RELEASE_TABLET | Freq: Every day | ORAL | Status: DC
  Administered 2016-01-12 – 2016-01-13 (×2): 81 mg via ORAL
  Filled 2016-01-12: qty 1

## 2016-01-12 MED ORDER — HYDRALAZINE HCL 20 MG/ML IJ SOLN
10.0000 mg | Freq: Four times a day (QID) | INTRAMUSCULAR | Status: DC | PRN
  Administered 2016-01-12: 10 mg via INTRAVENOUS
  Filled 2016-01-12: qty 1

## 2016-01-12 MED ORDER — PROPOFOL 10 MG/ML IV BOLUS
INTRAVENOUS | Status: DC | PRN
  Administered 2016-01-12: 50 mg via INTRAVENOUS

## 2016-01-12 MED ORDER — LACTATED RINGERS IV SOLN
INTRAVENOUS | Status: DC
  Administered 2016-01-12 (×2): via INTRAVENOUS

## 2016-01-12 MED ORDER — PROPOFOL 500 MG/50ML IV EMUL
INTRAVENOUS | Status: DC | PRN
  Administered 2016-01-12: 125 ug/kg/min via INTRAVENOUS

## 2016-01-12 MED ORDER — LIDOCAINE HCL (CARDIAC) 20 MG/ML IV SOLN
INTRAVENOUS | Status: DC | PRN
  Administered 2016-01-12: 100 mg via INTRATRACHEAL

## 2016-01-12 MED ORDER — NONFORMULARY OR COMPOUNDED ITEM
1.0000 "application " | Freq: Three times a day (TID) | Status: DC
  Filled 2016-01-12 (×28): qty 1

## 2016-01-12 MED ORDER — MAGNESIUM SULFATE 2 GM/50ML IV SOLN
2.0000 g | Freq: Once | INTRAVENOUS | Status: AC
  Administered 2016-01-12: 2 g via INTRAVENOUS
  Filled 2016-01-12: qty 50

## 2016-01-12 NOTE — Progress Notes (Signed)
Ultrasound technician called but patient had already eaten. Per their instructions he must be NPO for 8 hours for the ordered exam. Will hold patient NPO after midnight and have requested ultrasound first thing Tuesday morning.

## 2016-01-12 NOTE — Op Note (Signed)
[facility] Patient Name: [name] Procedure Date : 01/12/2016 MRN: [id number] Attending MD: Carlota Raspberry. Havery Moros , MD Date of Birth: [birth year] CSN: [id number] Age: 60 Admit Type: Inpatient Procedure:                Colonoscopy Indications:              Evaluation of unexplained GI bleeding, on Brilinta                            (last dose 3 days ago), aspirin - both held for                            bleeding, DES placed last month Providers:                Carlota Raspberry. Havery Moros, MD, Cleda Daub, RN, Despina Pole Tech, Technician, Lance Coon, CRNA Referring MD:              Medicines:                Monitored Anesthesia Care Complications:            No immediate complications. Estimated blood loss:                            Minimal. Estimated Blood Loss:     Estimated blood loss was minimal. Procedure:                Pre-Anesthesia Assessment:                           - Prior to the procedure, a History and Physical                            was performed, and patient medications and                            allergies were reviewed. The patient's tolerance of                            previous anesthesia was also reviewed. The risks                            and benefits of the procedure and the sedation                            options and risks were discussed with the patient.                            All questions were answered, and informed consent                            was obtained. Prior Anticoagulants: The patient has  taken antiplatelet medication, last dose was 3 days                            prior to procedure. ASA Grade Assessment: III - A                            patient with severe systemic disease. After                            reviewing the risks and benefits, the patient was                            deemed in satisfactory condition to undergo the             procedure.                           After obtaining informed consent, the colonoscope                            was passed under direct vision. Throughout the                            procedure, the patient's blood pressure, pulse, and                            oxygen saturations were monitored continuously. The                            [id number] scope was introduced through                            the anus and advanced to the the terminal ileum,                            with identification of the appendiceal orifice and                            IC valve. The colonoscopy was performed without                            difficulty. The patient tolerated the procedure                            well. The quality of the bowel preparation was                            fair. The terminal ileum, ileocecal valve,                            appendiceal orifice, and rectum were photographed. Scope In: 11:59:26 AM Scope Out: 12:31:19 PM Scope Withdrawal Time: 0 hours 26 minutes 19 seconds  Total Procedure Duration: 0 hours 31 minutes 53 seconds  Findings:      The perianal exam  findings include non-thrombosed external hemorrhoids,       and promnent palpable coxxyx.      A 4 mm polyp was found in the sigmoid colon. The polyp was sessile. The       polyp was removed with a cold biopsy forceps (given the patient's       Brilinta use cold snare not used). Resection and retrieval were complete.      A single medium-mouthed diverticulum was found in the ascending colon.      Internal hemorrhoids were found during retroflexion and during       endoscopy. There was a focal area of mild oozing at a hemoirrhoid with       small area of suspected fissure.      The terminal ileum appeared normal.      A large amount of liquid stool was found in the entire colon, making       visualization difficult on entrance. Lavage of the area was performed       using copious amounts,  resulting in clearance with fair visualization.       No other large polyps or mass lesions noted, small or very flat polyps       may not have been appreciated during this exam.      The exam was otherwise normal throughout the examined colon. NO rectal       mass, palpable coxxyx which is prominent. Impression:               - Preparation of the colon was fair, lavage                            performed, no large mass lesions or polyps noted.                           - Non-thrombosed external hemorrhoids, and promnent                            palpable coxxyx found on perianal exam. NO mass                            lesion.                           - One 4 mm polyp in the sigmoid colon, removed with                            a cold biopsy forceps. Resected and retrieved.                           - Diverticulosis in the ascending colon.                           - Internal hemorrhoids with suspected fissure and                            the likely cause of the patient's symptoms                           - The examined  portion of the ileum was normal. Moderate Sedation:      No moderate sedation, case performed with MAC Recommendation:           - Return patient to hospital ward for ongoing care.                           - Resume previous diet.                           - Continue present medications                           - Resume aspirin now                           - Hold brilinta for now, consider resuming tomorrow                            if no significant bleeding                           - topical nitroglycerin 0.125% ointment every 8                            hours for treatment of fissure                           - fiber supplement daily                           - Await pathology results. Patient will warrant a                            repeat screening colonoscopy further out from his                            DES placement given fair bowel prep today                            - If rectal bleeding persists from hemorrhoids,                            recommend surgical consultation for further                            evaluation. Patient is not a candidate for banding                            given antiplatelet therapy                           - Please call with further questions / concerns Procedure Code(s):        --- Professional ---                           [phone], Colonoscopy, flexible; with biopsy,  single                            or multiple Diagnosis Code(s):        --- Professional ---                           K64.8, Other hemorrhoids                           D12.5, Benign neoplasm of sigmoid colon                           K92.2, Gastrointestinal hemorrhage, unspecified                           K57.30, Diverticulosis of large intestine without                            perforation or abscess without bleeding CPT copyright 2016 [employer]. All rights reserved. The codes documented in this report are preliminary and upon coder review may  be revised to meet current compliance requirements. Remo Lipps P. Anan Dapolito, MD 01/12/2016 12:53:21 PM This report has been signed electronically. Number of Addenda: 0

## 2016-01-12 NOTE — Interval H&P Note (Signed)
History and Physical Interval Note:  01/12/2016 11:47 AM  [name]  has presented today for surgery, with the diagnosis of Rectal bleeding, Rectal mass  The various methods of treatment have been discussed with the patient and family. After consideration of risks, benefits and other options for treatment, the patient has consented to  Procedure(s): COLONOSCOPY (N/A) as a surgical intervention .  The patient's history has been reviewed, patient examined, no change in status, stable for surgery.  I have reviewed the patient's chart and labs.  Questions were answered to the patient's satisfaction.     [name]

## 2016-01-12 NOTE — Anesthesia Preprocedure Evaluation (Signed)
Anesthesia Evaluation  Patient identified by MRN, date of birth, ID band Patient awake    Reviewed: Allergy & Precautions, NPO status , Patient's Chart, lab work & pertinent test results  Airway Mallampati: II  TM Distance: >3 FB Neck ROM: Full    Dental no notable dental hx.    Pulmonary neg pulmonary ROS, Current Smoker,    Pulmonary exam normal breath sounds clear to auscultation       Cardiovascular hypertension, negative cardio ROS Normal cardiovascular exam Rhythm:Regular Rate:Normal     Neuro/Psych Bipolar Disorder TIA   GI/Hepatic negative GI ROS, Neg liver ROS,   Endo/Other  diabetes  Renal/GU negative Renal ROS  negative genitourinary   Musculoskeletal negative musculoskeletal ROS (+)   Abdominal   Peds negative pediatric ROS (+)  Hematology  (+) anemia ,   Anesthesia Other Findings   Reproductive/Obstetrics negative OB ROS                             Anesthesia Physical Anesthesia Plan  ASA: III  Anesthesia Plan: MAC   Post-op Pain Management:    Induction: Intravenous  Airway Management Planned: Simple Face Mask  Additional Equipment:   Intra-op Plan:   Post-operative Plan:   Informed Consent: I have reviewed the patients History and Physical, chart, labs and discussed the procedure including the risks, benefits and alternatives for the proposed anesthesia with the patient or authorized representative who has indicated his/her understanding and acceptance.   Dental advisory given  Plan Discussed with: CRNA and Surgeon  Anesthesia Plan Comments:         Anesthesia Quick Evaluation

## 2016-01-12 NOTE — Transfer of Care (Signed)
Immediate Anesthesia Transfer of Care Note  Patient: [name]  Procedure(s) Performed: Procedure(s): COLONOSCOPY (N/A)  Patient Location: Endoscopy Unit  Anesthesia Type:MAC  Level of Consciousness: awake and patient cooperative  Airway & Oxygen Therapy: Patient Spontanous Breathing and Patient connected to nasal cannula oxygen  Post-op Assessment: Report given to RN and Post -op Vital signs reviewed and stable  Post vital signs: Reviewed and stable  Last Vitals:  Vitals:   01/12/16 0738 01/12/16 1101  BP: (!) 145/84 (!) 181/84  Pulse: 67 62  Resp: 18 (!) 22  Temp: 36.7 C 36.8 C    Last Pain:  Vitals:   01/12/16 1101  TempSrc: Oral  PainSc:       Patients Stated Pain Goal: 4 ([id number])  Complications: No apparent anesthesia complications

## 2016-01-12 NOTE — Anesthesia Procedure Notes (Signed)
Procedure Name: MAC Date/Time: 01/12/2016 11:51 AM Performed by: Lance Coon Pre-anesthesia Checklist: Patient identified, Emergency Drugs available, Suction available, Timeout performed and Patient being monitored Patient Re-evaluated:Patient Re-evaluated prior to inductionOxygen Delivery Method: Nasal cannula Intubation Type: IV induction

## 2016-01-12 NOTE — H&P (View-Only) (Signed)
Progress Note    [facility] Attending   3:47 PM Addendum:  KUB ok Hgb down another gram K 5.7 Last stool no blood  Proceed w/ colon prep  Gatha Mayer, MD, [facility] 3:48 PM'     I have taken an indeendent history, reviewed the chart and examined the patient. I agree with the Advanced Practitioner's note, impression and recommendations.   Also:  Abdominal pain and distention are new Bleeding picking up Fleshy firm anterior rectal mass K now 5.5   Cancel [place] for abnl LFT Will get AXR CBC, BMET, PT/INR Will need a CT at some point was thinking after colonoscopy but could need before now - ? If he is developing colonic obstruction from more proximal aspect of rectal mass - what I can feel is not obstructing however  Brillinta still around - Hgb not dangerously low so have not given PLT to counteract  Gatha Mayer, MD, [facility] [phone] (pager)  01/11/2016 12:13 PM     Assessment / Plan:    Assessment: * Bloody stool, painless in beginning now with abdominal distention and pain  * anterior Rectal mass on digital exam.  * 9/21 tandem DES and angioplasties. Non STEMI at presentation. On 81 ASA, Brlinita. Brilinta now discontinued, last dose 0900 01/09/16   * Hiccups. No previous hx or sxs to suggest GERD. Resolved.  * Thrombocytopenia.   * AKI. Hard to correct hypokalemia. Hypomagnesemia. Hyperkalemic today  * Remote hx crack cocaine addiction. Hepatitis labs negative  Plan: 1. Colonoscopy planned tomorrow with Dr. Havery Moros. Discussed risks, benefits, limitations and alternatives and the patient agrees to proceed. 2. Clears today, NPO after midnight 3. Will discuss above with Dr. Carlean Purl, please await any further recommendations  Thank you for your kind consultation we will continue to follow.   LOS: 4 days   Levin Erp  01/11/2016, 10:02 AM  Pager # [phone]  Subjective  Chief  Complaint:Bloody stool, rectal mass  Pt found laying in bed this morning, he tells me he has a fair amount of lower abdominal discomfort. This comes in "waves" and is worse while he is passing blood/BM. He has continued with BRBPR overnight in "quite large amounts" per nursing. His hiccups have resolved.    Objective   Vital signs in last 24 hours: Temp:  [97.8 F (36.6 C)-98.6 F (37 C)] 97.9 F (36.6 C) (10/15 0744) Pulse Rate:  [71-92] 88 (10/15 0345) Resp:  [13-30] 21 (10/15 0744) BP: (136-165)/(92-109) 136/93 (10/15 0744) SpO2:  [96 %-99 %] 98 % (10/15 0744) Last BM Date: 01/10/16 General:    white male in NAD Heart:  Regular rate and rhythm; no murmurs Lungs: Respirations even and unlabored, lungs CTA bilaterally Abdomen:  Soft, mild ttp lower abdomen and nondistended. Normal bowel sounds. Extremities:  Without edema. Neurologic:  Alert and oriented,  grossly normal neurologically. Psych:  Cooperative. Normal mood and affect.  Intake/Output from previous day: 10/14 0701 - 10/15 0700 In: 1070 [P.O.:970; I.V.:50; IV Piggyback:50] Out: 950 [Urine:950]  Lab Results:  Recent Labs  01/09/16 2207 01/10/16 0431 01/11/16 0320  WBC 6.3 6.9 9.3  HGB 11.0* 11.2* 10.4*  HCT 32.1* 32.9* 30.8*  PLT 111* 119* 137*   BMET  Recent Labs  01/09/16 0206 01/10/16 0431  NA 138 138  K 2.7* 3.2*  CL 98* 101  CO2 30 26  GLUCOSE 102* 121*  BUN 42* 16  CREATININE 1.70* 1.14  CALCIUM 7.8* 7.7*   LFT  Recent Labs  01/10/16 0431  PROT 6.0*  ALBUMIN 2.4*  AST 85*  ALT 105*  ALKPHOS 80  BILITOT 1.0

## 2016-01-12 NOTE — Progress Notes (Signed)
[facility] PROGRESS NOTE  [name] [id number] DOB: 1955-11-08 DOA: 01/07/2016   PCP: Ricke Hey, MD     Assessment/Plan: Principal Problem:   Acute renal failure (ARF) ([place]) Active Problems:   Hypokalemia   Hypotension   Hypothermia   Renovascular hypertension   History of non-ST elevation myocardial infarction (NSTEMI)   Tobacco use   Diarrhea   Dyslipidemia   Anuria   Rectal mass   Blood in stool    [name]  is a 60 y.o. 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 He presented to his PCP on the morning of 12/18/2015 with chest pain and NSTEMI, underwent coronary angiography the following morning revealing high-grade proximal LAD stenosis for which he underwent successful angioplasty. 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 His blood pressure has been high and uncontrolled. He had been compliant with his medications.  He was discharged home on 12/19/2015, states that he felt well until 4-5 days ago he started having frequent diarrhea.EMS reported BP 74/42, HR 50's, 97% on RA, and gave a NS bolus en route.rectal temperature of 95.79F. Creatinine was 8.37 (from recent 1.09) with an anion gap metabolic acidosis and normal lactate. WBC 8.7. K 2.3. Patient developed melena on 8/13, GI consulted  Assessment and plan Acute renal failure: Cr >8 from baseline 1.1-1.2. Prerenal insult due to profound hypoperfusion from hypovolemia and antihypertensives. Also worsened by ACE inhibitor/HCTZ. - Nephrology consulted, patient has been on an ACE inhibitor, had an angiogram on 12/18/15. Preprocedure creatinine was 1.08. Hypotensive on arrival. Started on isotonic bicarbonate solution, now discontinued Needing aggressive potassium/magnesium replacement, Creatinine  improved from 8.37>1.7>0.88 Nephrology has signed off  Melena/acute blood loss anemia -this continues, patient had abdominal pain with increased frequency of bloody stools overnight Likely ischemic colitis, holding DAPT, mild stable thrombocytopenia, discussed with Dr Gardiner Fanti is aware of the plan, of holding DAPT Ok to proceed with sedation, for GI intervention, colonoscopy for rectal mass  PPI   [place] [facility] consulted -GI plans for colonoscopy on Monday Hemoglobin has changed from 14.3 on 9/21 to 10.4>10.0   Hypotension:  resolved. Holding antihypertensive medications. secondary to   GI losses. With hypoperfusion and diarrhea, concerned for ischemic colitis .  Status post aggressive fluid resuscitation  -TSH and cortisol within normal limits Blood cultures no growth so far, received Zosyn/vancomycin on 10/11, for 1 day, now discontinued   hiccups  Dose of baclofen increased to 10 mg 3 times a day  Transaminitis-discontinued Lipitor, follow liver function closely, right upper quadrant ultrasound ordered by GI  Diarrhea: FOBT positive   - GIPP, C. Diff both negative  Hypokalemia/hypomagnesemia:  Continue to replete potassium/magnesium Recheck  History of NSTEMI/CAD s/p PTCA and stenting of proximal/mid LAD w/DES x2: Last month by Dr. Einar Gip Appreciate Dr. Irven Shelling  Consult recommendations Holding  DAPT due to melena, holding antihypertensive medications Restart low-dose beta blocker  Renovascular HTN: s/p R renal artery stent. Continue to hold clonidine, lisinopril/HCTZ, restart low-dose beta blocker  Tobacco use disorder: Quit x2 weeks, placed on zyban at recent discharge - Hold zyban  Dyslipidemia: HDL 17 Hold statin due to transaminitis  Depression/anxiety:  - Hold sedating medications   DVT prophylaxsis heparin  Code Status:  Full code    Family Communication: Discussed in detail with the patient, all imaging results, lab  results explained to the patient    Disposition Plan:  tx to tele post procedure     Consultants:  Nephrology  Cardiology    Procedures:  None  Antibiotics: Anti-infectives    Start     Dose/Rate Route Frequency Ordered Stop   01/07/16 1600  vancomycin (VANCOCIN) 1,750 mg in sodium chloride 0.9 % 500 mL IVPB     1,750 mg 250 mL/hr over 120 Minutes Intravenous  Once 01/07/16 1521 01/07/16 1849   01/07/16 1530  piperacillin-tazobactam (ZOSYN) IVPB 3.375 g     3.375 g 100 mL/hr over 30 Minutes Intravenous  Once 01/07/16 1521 01/07/16 1606         HPI/Subjective: Continue  Frequent  bloody stools   Objective: Vitals:   01/12/16 0507 01/12/16 0700 01/12/16 0738 01/12/16 1101  BP: (!) 147/81  (!) 145/84 (!) 181/84  Pulse:   67 62  Resp: 16 (!) 28 18 (!) 22  Temp:   98.1 F (36.7 C) 98.2 F (36.8 C)  TempSrc:   Oral Oral  SpO2:    98%  Weight:    77.9 kg (171 lb 11.8 oz)  Height:    5\' 6"  (1.676 m)    Intake/Output Summary (Last 24 hours) at 01/12/16 1213 Last data filed at 01/12/16 0700  Gross per 24 hour  Intake             2250 ml  Output             2054 ml  Net              196 ml    Exam:  Examination:  General exam: Appears calm and comfortable  Respiratory system: Clear to auscultation. Respiratory effort normal. Cardiovascular system: S1 & S2 heard, RRR. No JVD, murmurs, rubs, gallops or clicks. No pedal edema. Gastrointestinal system: Abdomen is nondistended, soft and nontender. No organomegaly or masses felt. Normal bowel sounds heard. Central nervous system: Alert and oriented. No focal neurological deficits. Extremities: Symmetric 5 x 5 power. Skin: No rashes, lesions or ulcers Psychiatry: Judgement and insight appear normal. Mood & affect appropriate.     Data Reviewed: I have personally reviewed following labs and imaging studies  Micro Results Recent Results (from the past 240 hour(s))  Blood Culture (routine x 2)     Status: None (Preliminary result)    Collection Time: 01/07/16  2:22 PM  Result Value Ref Range Status   Specimen Description BLOOD LEFT ANTECUBITAL  Final   Special Requests BOTTLES DRAWN AEROBIC AND ANAEROBIC 5CC  Final   Culture NO GROWTH 4 DAYS  Final   Report Status PENDING  Incomplete  Urine culture     Status: None   Collection Time: 01/07/16  4:44 PM  Result Value Ref Range Status   Specimen Description URINE, CLEAN CATCH  Final   Special Requests NONE  Final   Culture NO GROWTH  Final   Report Status 01/08/2016 FINAL  Final  Blood Culture (routine x 2)     Status: None (Preliminary result)   Collection Time: 01/07/16  4:56 PM  Result Value Ref Range Status   Specimen Description BLOOD UNK  Final   Special Requests BOTTLES DRAWN AEROBIC AND ANAEROBIC 5CC  Final   Culture NO GROWTH 4 DAYS  Final   Report Status PENDING  Incomplete  MRSA PCR Screening     Status: None   Collection Time: 01/07/16  8:55 PM  Result Value Ref Range Status  MRSA by PCR NEGATIVE NEGATIVE Final    Comment:        The GeneXpert MRSA Assay (FDA approved for NASAL specimens only), is one component of a comprehensive MRSA colonization surveillance program. It is not intended to diagnose MRSA infection nor to guide or monitor treatment for MRSA infections.   C difficile quick scan w PCR reflex     Status: None   Collection Time: 01/08/16  3:56 AM  Result Value Ref Range Status   C Diff antigen NEGATIVE NEGATIVE Final   C Diff toxin NEGATIVE NEGATIVE Final   C Diff interpretation No C. difficile detected.  Final  Gastrointestinal Panel by PCR , Stool     Status: None   Collection Time: 01/08/16  3:56 AM  Result Value Ref Range Status   Campylobacter species NOT DETECTED NOT DETECTED Final   Plesimonas shigelloides NOT DETECTED NOT DETECTED Final   Salmonella species NOT DETECTED NOT DETECTED Final   Yersinia enterocolitica NOT DETECTED NOT DETECTED Final   Vibrio species NOT DETECTED NOT DETECTED Final   Vibrio cholerae NOT  DETECTED NOT DETECTED Final   Enteroaggregative E coli (EAEC) NOT DETECTED NOT DETECTED Final   Enteropathogenic E coli (EPEC) NOT DETECTED NOT DETECTED Final   Enterotoxigenic E coli (ETEC) NOT DETECTED NOT DETECTED Final   Shiga like toxin producing E coli (STEC) NOT DETECTED NOT DETECTED Final   Shigella/Enteroinvasive E coli (EIEC) NOT DETECTED NOT DETECTED Final   Cryptosporidium NOT DETECTED NOT DETECTED Final   Cyclospora cayetanensis NOT DETECTED NOT DETECTED Final   Entamoeba histolytica NOT DETECTED NOT DETECTED Final   Giardia lamblia NOT DETECTED NOT DETECTED Final   Adenovirus F40/41 NOT DETECTED NOT DETECTED Final   Astrovirus NOT DETECTED NOT DETECTED Final   Norovirus GI/GII NOT DETECTED NOT DETECTED Final   Rotavirus A NOT DETECTED NOT DETECTED Final   Sapovirus (I, II, IV, and V) NOT DETECTED NOT DETECTED Final    Radiology Reports Dg Chest 2 View  Result Date: 12/18/2015 CLINICAL DATA:  Midline upper and lower chest pain for 3 days with sweats. EXAM: CHEST  2 VIEW COMPARISON:  03/25/2009 FINDINGS: The lungs are clear wiithout focal pneumonia, edema, pneumothorax or pleural effusion. The cardiopericardial silhouette is within normal limits for size. The visualized bony structures of the thorax are intact. IMPRESSION: No active cardiopulmonary disease. Electronically Signed   By: Misty Stanley M.D.   On: 12/18/2015 12:45   US Renal  Result Date: 01/08/2016 CLINICAL DATA:  Acute renal failure EXAM: RENAL / URINARY TRACT ULTRASOUND COMPLETE COMPARISON:  CT scan of the abdomen and pelvis of May 01, 2008 FINDINGS: Right Kidney: Length: 11.9 cm. The renal cortical echotexture remains lower than that of the adjacent liver. There is no hydronephrosis. There is no focal mass nor evidence of stones. Left Kidney: Length: 11.9 cm. The renal cortical echotexture is similar to that of the right kidney. There is a mid to lower pole cyst measuring 1.9 cm in greatest dimension. There is  no hydronephrosis, solid mass, nor evidence of stones. Bladder: The partially distended urinary bladder is normal. IMPRESSION: No acute or significant chronic abnormality of the kidneys or urinary bladder. Electronically Signed   By: David  Martinique M.D.   On: 01/08/2016 10:05   Dg Chest Port 1 View  Result Date: 01/07/2016 CLINICAL DATA:  Hypotension and lightheadedness. EXAM: PORTABLE CHEST 1 VIEW COMPARISON:  12/18/2015 FINDINGS: Mild bibasilar atelectasis. No acute consolidation or effusion. Stable borderline cardiomegaly. Pulmonary vascularity within  normal limits. No pneumothorax. IMPRESSION: Minimal bibasilar atelectasis Electronically Signed   By: Donavan Foil M.D.   On: 01/07/2016 15:20   Dg Abd Portable 1v  Result Date: 01/11/2016 CLINICAL DATA:  Generalized abdominal pain and distention. EXAM: PORTABLE ABDOMEN - 1 VIEW COMPARISON:  CT the abdomen and pelvis 05/01/2008. Renal ultrasound 01/08/2016 FINDINGS: The bowel gas pattern is normal. No radio-opaque calculi or other significant radiographic abnormality are seen. IMPRESSION: Negative one view abdomen. Electronically Signed   By: San Morelle M.D.   On: 01/11/2016 15:45     CBC  Recent Labs Lab 01/07/16 1415  01/09/16 2207 01/10/16 0431 01/11/16 0320 01/11/16 1231 01/12/16 0228  WBC 8.7  < > 6.3 6.9 9.3 7.2 7.4  HGB 12.3*  < > 11.0* 11.2* 10.4* 9.8* 10.0*  HCT 34.8*  < > 32.1* 32.9* 30.8* 29.6* 30.9*  PLT 105*  < > 111* 119* 137* 113* 126*  MCV 86.8  < > 88.2 88.2 90.3 91.9 93.1  MCH 30.7  < > 30.2 30.0 30.5 30.4 30.1  MCHC 35.3  < > 34.3 34.0 33.8 33.1 32.4  RDW 14.3  < > 13.9 14.0 14.3 14.2 14.5  LYMPHSABS 2.3  --   --   --   --   --   --   MONOABS 0.7  --   --   --   --   --   --   EOSABS 0.2  --   --   --   --   --   --   BASOSABS 0.0  --   --   --   --   --   --   < > = values in this interval not displayed.  Chemistries   Recent Labs Lab 01/07/16 1415  01/08/16 0555 01/09/16 0206 01/09/16 0718  01/10/16 0431 01/11/16 0847 01/11/16 1231 01/12/16 0228  NA 133*  --  138 138  --  138 138 137 139  K 2.3*  --  2.3* 2.7*  --  3.2* 5.5* 5.7* 4.9  CL 98*  --  106 98*  --  101 112* 112* 114*  CO2 15*  --  20* 30  --  26 21* 21* 18*  GLUCOSE 122*  --  96 102*  --  121* 94 93 108*  BUN 90*  --  74* 42*  --  16 9 9 8   CREATININE 8.37*  < > 4.32* 1.70*  --  1.14 0.96 0.96 0.88  CALCIUM 8.2*  --  7.6* 7.8*  --  7.7* 7.6* 7.9* 8.0*  MG  --   --  1.9  --  1.3* 1.5*  --   --  1.7  AST 73*  --   --  63*  --  85* 89*  --  90*  ALT 105*  --   --  77*  --  105* 126*  --  142*  ALKPHOS 79  --   --  85  --  80 73  --  86  BILITOT 0.3  --   --  0.8  --  1.0 0.8  --  0.7  < > = values in this interval not displayed. ------------------------------------------------------------------------------------------------------------------ estimated creatinine clearance is 87.6 mL/min (by C-G formula based on SCr of 0.88 mg/dL). ------------------------------------------------------------------------------------------------------------------ No results for input(s): HGBA1C in the last 72 hours. ------------------------------------------------------------------------------------------------------------------ No results for input(s): CHOL, HDL, LDLCALC, TRIG, CHOLHDL, LDLDIRECT in the last 72 hours. ------------------------------------------------------------------------------------------------------------------ No results for input(s): TSH, T4TOTAL, T3FREE,  THYROIDAB in the last 72 hours.  Invalid input(s): FREET3 ------------------------------------------------------------------------------------------------------------------ No results for input(s): VITAMINB12, FOLATE, FERRITIN, TIBC, IRON, RETICCTPCT in the last 72 hours.  Coagulation profile  Recent Labs Lab 01/11/16 1231  INR 1.18    No results for input(s): DDIMER in the last 72 hours.  Cardiac Enzymes No results for input(s): CKMB, TROPONINI,  MYOGLOBIN in the last 168 hours.  Invalid input(s): CK ------------------------------------------------------------------------------------------------------------------ Invalid input(s): POCBNP   CBG:  Recent Labs Lab 01/07/16 1459 01/07/16 2052 01/12/16 1119  GLUCAP 114* 107* 87       Studies: Dg Abd Portable 1v  Result Date: 01/11/2016 CLINICAL DATA:  Generalized abdominal pain and distention. EXAM: PORTABLE ABDOMEN - 1 VIEW COMPARISON:  CT the abdomen and pelvis 05/01/2008. Renal ultrasound 01/08/2016 FINDINGS: The bowel gas pattern is normal. No radio-opaque calculi or other significant radiographic abnormality are seen. IMPRESSION: Negative one view abdomen. Electronically Signed   By: San Morelle M.D.   On: 01/11/2016 15:45      Lab Results  Component Value Date   HGBA1C  05/02/2008    5.8 (NOTE)   The ADA recommends the following therapeutic goal for glycemic   control related to Hgb A1C measurement:   Goal of Therapy:   < 7.0% Hgb A1C   Reference: American Diabetes Association: Clinical Practice   Recommendations 2008, Diabetes Care,  2008, 31:(Suppl 1).   Lab Results  Component Value Date   LDLCALC 46 12/18/2015   CREATININE 0.88 01/12/2016       Scheduled Meds: . [MAR Hold] baclofen  10 mg Oral TID  . [MAR Hold] feeding supplement  1 Container Oral QHS  . [MAR Hold] metoprolol tartrate  12.5 mg Oral BID  . [MAR Hold] pantoprazole  40 mg Oral Q0600  . [MAR Hold] sodium chloride flush  3 mL Intravenous Q12H   Continuous Infusions: . sodium chloride    . sodium chloride 75 mL/hr at 01/12/16 0036  . lactated ringers 10 mL/hr at 01/12/16 1114     LOS: 5 days    Time spent: >30 MINS    [facility] Pager [phone]. If 7PM-7AM, please contact night-coverage at [email or web address], password [facility] 01/12/2016, 12:13 PM  LOS: 5 days

## 2016-01-12 NOTE — Anesthesia Postprocedure Evaluation (Signed)
Anesthesia Post Note  Patient: [name]  Procedure(s) Performed: Procedure(s) (LRB): COLONOSCOPY (N/A)  Patient location during evaluation: PACU Anesthesia Type: MAC Level of consciousness: awake and alert Pain management: pain level controlled Vital Signs Assessment: post-procedure vital signs reviewed and stable Respiratory status: spontaneous breathing, nonlabored ventilation, respiratory function stable and patient connected to nasal cannula oxygen Cardiovascular status: stable and blood pressure returned to baseline Anesthetic complications: no    Last Vitals:  Vitals:   01/12/16 1101 01/12/16 1241  BP: (!) 181/84 (!) 161/76  Pulse: 62 (!) 59  Resp: (!) 22 (!) 23  Temp: 36.8 C 36.6 C    Last Pain:  Vitals:   01/12/16 1241  TempSrc: Oral  PainSc:                  Kori Goins S

## 2016-01-13 ENCOUNTER — Inpatient Hospital Stay (HOSPITAL_COMMUNITY): Payer: Medicaid Other

## 2016-01-13 DIAGNOSIS — R945 Abnormal results of liver function studies: Secondary | ICD-10-CM

## 2016-01-13 DIAGNOSIS — R7989 Other specified abnormal findings of blood chemistry: Secondary | ICD-10-CM

## 2016-01-13 DIAGNOSIS — K5730 Diverticulosis of large intestine without perforation or abscess without bleeding: Secondary | ICD-10-CM

## 2016-01-13 MED ORDER — BOOST / RESOURCE BREEZE PO LIQD: 1.0000 | Freq: Every day | ORAL | 0 refills | Status: DC

## 2016-01-13 MED ORDER — CLONIDINE HCL 0.2 MG PO TABS: 0.2000 mg | ORAL_TABLET | Freq: Three times a day (TID) | ORAL | 0 refills | Status: DC

## 2016-01-13 MED ORDER — METOPROLOL TARTRATE 100 MG PO TABS: 50.0000 mg | ORAL_TABLET | Freq: Two times a day (BID) | ORAL | 1 refills | Status: DC

## 2016-01-13 MED ORDER — NONFORMULARY OR COMPOUNDED ITEM
1.0000 | Freq: Three times a day (TID) | 5 refills | Status: DC
Start: 2016-01-13 — End: 2016-03-31

## 2016-01-13 MED ORDER — PANTOPRAZOLE SODIUM 40 MG PO TBEC: 40.0000 mg | DELAYED_RELEASE_TABLET | Freq: Every day | ORAL | 1 refills | Status: DC

## 2016-01-13 NOTE — Care Management Note (Signed)
Case Management Note  Patient Details  Name: [name] MRN: [id number] Date of Birth: [birth year]  Subjective/Objective:       Adm w renal failure             Action/Plan:lives w fam, pcp dr Alyson Ingles  Expected Discharge Date:                  Expected Discharge Plan:  Home/Self Care  In-House Referral:     Discharge planning Services     Post Acute Care Choice:  Durable Medical Equipment Choice offered to:     DME Arranged:  Walker rolling DME Agency:  [place]:    [place]:     Status of Service:  Completed, signed off  If discussed at [place] of Stay Meetings, dates discussed:    Additional Comments: pt has gout. Needs rolling walker. rw ordered from [facility] and they will del to room.  Lacretia Leigh, RN 01/13/2016, 11:11 AM

## 2016-01-13 NOTE — Progress Notes (Signed)
Progress Note   Subjective  Patient did well overnight. He denies any further rectal bleeding. Tolerating diet. Hgb stable.    Objective   Vital signs in last 24 hours: Temp:  [97.4 F (36.3 C)-99.1 F (37.3 C)] 97.6 F (36.4 C) (10/17 0821) Pulse Rate:  [58-110] 91 (10/17 0821) Resp:  [16-38] 18 (10/17 0821) BP: (104-181)/(60-96) 166/96 (10/17 0821) SpO2:  [97 %-100 %] 99 % (10/17 0821) Weight:  [171 lb 11.8 oz (77.9 kg)] 171 lb 11.8 oz (77.9 kg) (10/16 1101) Last BM Date: 01/12/16 General:    white male in NAD Heart:  Regular rate and rhythm; Lungs: Respirations even and unlabored, lungs CTA bilaterally Abdomen:  Soft, nontender and nondistended.  Extremities:  Without edema. Neurologic:  Alert and oriented,  grossly normal neurologically. Psych:  Cooperative. Normal mood and affect.  Intake/Output from previous day: 10/16 0701 - 10/17 0700 In: 2605 [P.O.:477; I.V.:2128] Out: 951 [Urine:950; Stool:1] Intake/Output this shift: Total I/O In: -  Out: 250 [Urine:250]  Lab Results:  Recent Labs  01/11/16 0320 01/11/16 1231 01/12/16 0228  WBC 9.3 7.2 7.4  HGB 10.4* 9.8* 10.0*  HCT 30.8* 29.6* 30.9*  PLT 137* 113* 126*   BMET  Recent Labs  01/11/16 0847 01/11/16 1231 01/12/16 0228  NA 138 137 139  K 5.5* 5.7* 4.9  CL 112* 112* 114*  CO2 21* 21* 18*  GLUCOSE 94 93 108*  BUN 9 9 8   CREATININE 0.96 0.96 0.88  CALCIUM 7.6* 7.9* 8.0*   LFT  Recent Labs  01/12/16 0228  PROT 6.0*  ALBUMIN 2.5*  AST 90*  ALT 142*  ALKPHOS 86  BILITOT 0.7   PT/INR  Recent Labs  01/11/16 1231  LABPROT 15.1  INR 1.18    Studies/Results: US Abdomen Limited  Result Date: 01/13/2016 CLINICAL DATA:  Elevated liver function tests, diabetes, hypertension EXAM: US ABDOMEN LIMITED - RIGHT UPPER QUADRANT COMPARISON:  Ultrasound the abdomen of 01/08/2016 and CT abdomen pelvis of 05/01/2008 FINDINGS: Gallbladder: The gallbladder is somewhat contracted and  therefore the wall is slightly prominent. However there is no pain over the gallbladder and no gallstones are noted. The slightly prominent gallbladder wall may be due to lack of distension. No pericholecystic fluid is seen. Common bile duct: Diameter: The common bile duct is normal measuring 4.6 mm. Liver: The liver has a normal echogenic pattern. No focal hepatic abnormality is seen. The contours of the liver are unremarkable. IMPRESSION: 1. Slightly contracted gallbladder with minimally prominent gallbladder wall. No pain over the gallbladder. No gallstones. 2. No abnormality of the liver is noted by ultrasound. Electronically Signed   By: Ivar Drape M.D.   On: 01/13/2016 08:30   Dg Abd Portable 1v  Result Date: 01/11/2016 CLINICAL DATA:  Generalized abdominal pain and distention. EXAM: PORTABLE ABDOMEN - 1 VIEW COMPARISON:  CT the abdomen and pelvis 05/01/2008. Renal ultrasound 01/08/2016 FINDINGS: The bowel gas pattern is normal. No radio-opaque calculi or other significant radiographic abnormality are seen. IMPRESSION: Negative one view abdomen. Electronically Signed   By: San Morelle M.D.   On: 01/11/2016 15:45       Assessment / Plan:   60 y/o male with NSTEMI with DES / angioplasty on 9/21 on Brilinta and aspirin, who presented with painless rectal bleeding. Colonoscopy done yesterday, one small polyp removed, otherwise fair prep which required extensive lavage but no other large polyps or mass lesions. No rectal mass noted, palpable lesion is actually prominent coccyx.  Internal hemorrhoids with suspected fissure the likely culprit based on this exam, which had some stigmata of recent bleeding. He has done well since the procedure with no further bleeding on aspirin.   Otherwise has been noted to have thrombocytopenia with elevated ALT/AST. US done overnight to ensure no evidence of cirrhosis and was normal. Testing for hep B/C was negative.   At this time recommend the  following: - daily fiber supplement for hemorrhoids - nitroglycerin topical ointment 0.2% applied within the anal canal every 8 hours for treatment of fissure - continue aspirin - given recent DES placement and no bleeding yesterday, would resume Brilinta today - regular diet okay - patient will need further workup for elevated ALT - please send ferritin, TIBC, ANA, total IgG, SMA, can be done as outpatient if ALT is stable. No classic drugs he is on for drug induced liver injury but would monitor. No evidence of cirrhosis / fatty liver on imaging.   We will sign off for now, and coordinate follow up as outpatient once discharged.   Please call with questions.  [place] Cellar, MD [facility] Pager [phone]

## 2016-01-13 NOTE — Discharge Summary (Addendum)
Physician Discharge Summary  [name] MRN: [id number] DOB/AGE: 1956-03-27 60 y.o.  PCP: Ricke Hey, MD   Admit date: 01/07/2016 Discharge date: 01/13/2016  Discharge Diagnoses:    Principal Problem:   Acute renal failure (ARF) ([place]) Active Problems:   Hypokalemia   Hypotension   Hypothermia   Renovascular hypertension   History of non-ST elevation myocardial infarction (NSTEMI)   Tobacco use   Diarrhea   Dyslipidemia   Anuria   Rectal mass   Blood in stool   Abnormal liver function    Follow-up recommendations Follow-up with PCP in 3-5 days , including all  additional recommended appointments as below Follow-up CBC, CMP in 3-5 days Patient to follow-up with [facility]-to discuss pathology results from the biopsy, repeat screening colonoscopy? Patient needs close outpatient workup by PCP for elevated AST/ALT-see recommendations below       Medication List    STOP taking these medications   atorvastatin 80 MG tablet Commonly known as:  LIPITOR   indomethacin 50 MG capsule Commonly known as:  INDOCIN   lisinopril-hydrochlorothiazide 20-12.5 MG tablet Commonly known as:  PRINZIDE,ZESTORETIC   predniSONE 20 MG tablet Commonly known as:  DELTASONE   QUEtiapine 400 MG 24 hr tablet Commonly known as:  SEROQUEL XR   sertraline 100 MG tablet Commonly known as:  ZOLOFT     TAKE these medications   aspirin 81 MG chewable tablet Chew 1 tablet (81 mg total) by mouth daily.   buPROPion 150 MG 12 hr tablet Commonly known as:  WELLBUTRIN SR Take 1 tablet (150 mg total) by mouth 2 (two) times daily.   cloNIDine 0.2 MG tablet Commonly known as:  CATAPRES Take 1 tablet (0.2 mg total) by mouth 3 (three) times daily. What changed:  when to take this   colchicine 0.6 MG tablet Take 1 tablet (0.6 mg total) by mouth daily.   feeding supplement Liqd Take 1 Container by mouth at bedtime.   HYDROcodone-acetaminophen 5-325 MG tablet Commonly known as:   NORCO/VICODIN Take 1 tablet by mouth every 4 (four) hours as needed.   LORazepam 1 MG tablet Commonly known as:  ATIVAN Take 1 tablet (1 mg total) by mouth every morning.   metoprolol 100 MG tablet Commonly known as:  LOPRESSOR Take 0.5 tablets (50 mg total) by mouth 2 (two) times daily. What changed:  how much to take   nitroGLYCERIN 0.4 MG SL tablet Commonly known as:  NITROSTAT Place 0.4 mg under the tongue every 5 (five) minutes as needed. For chest pain   NONFORMULARY OR COMPOUNDED ITEM Place 1 application rectally 3 (three) times daily.   pantoprazole 40 MG tablet Commonly known as:  PROTONIX Take 1 tablet (40 mg total) by mouth daily at 6 (six) AM. Start taking on:  01/14/2016   ticagrelor 90 MG Tabs tablet Commonly known as:  BRILINTA Take 1 tablet (90 mg total) by mouth 2 (two) times daily.          Discharge Condition: Stable    Discharge Instructions Get Medicines reviewed and adjusted: Please take all your medications with you for your next visit with your Primary MD  Please request your Primary MD to go over all hospital tests and procedure/radiological results at the follow up, please ask your Primary MD to get all Hospital records sent to his/her office.  If you experience worsening of your admission symptoms, develop shortness of breath, life threatening emergency, suicidal or homicidal thoughts you must seek medical attention immediately by  calling 911 or calling your MD immediately if symptoms less severe.  You must read complete instructions/literature along with all the possible adverse reactions/side effects for all the Medicines you take and that have been prescribed to you. Take any new Medicines after you have completely understood and accpet all the possible adverse reactions/side effects.   Do not drive when taking Pain medications.   Do not take more than prescribed Pain, Sleep and Anxiety Medications  Special Instructions: If you have  smoked or chewed Tobacco in the last 2 yrs please stop smoking, stop any regular Alcohol and or any Recreational drug use.  Wear Seat belts while driving.  Please note  You were cared for by a hospitalist during your hospital stay. Once you are discharged, your primary care physician will handle any further medical issues. Please note that NO REFILLS for any discharge medications will be authorized once you are discharged, as it is imperative that you return to your primary care physician (or establish a relationship with a primary care physician if you do not have one) for your aftercare needs so that they can reassess your need for medications and monitor your lab values.     No Known Allergies    Disposition: 01-Home or Self Care   Consults:  GI     Significant Diagnostic Studies:  Dg Chest 2 View  Result Date: 12/18/2015 CLINICAL DATA:  Midline upper and lower chest pain for 3 days with sweats. EXAM: CHEST  2 VIEW COMPARISON:  03/25/2009 FINDINGS: The lungs are clear wiithout focal pneumonia, edema, pneumothorax or pleural effusion. The cardiopericardial silhouette is within normal limits for size. The visualized bony structures of the thorax are intact. IMPRESSION: No active cardiopulmonary disease. Electronically Signed   By: Misty Stanley M.D.   On: 12/18/2015 12:45   US Renal  Result Date: 01/08/2016 CLINICAL DATA:  Acute renal failure EXAM: RENAL / URINARY TRACT ULTRASOUND COMPLETE COMPARISON:  CT scan of the abdomen and pelvis of May 01, 2008 FINDINGS: Right Kidney: Length: 11.9 cm. The renal cortical echotexture remains lower than that of the adjacent liver. There is no hydronephrosis. There is no focal mass nor evidence of stones. Left Kidney: Length: 11.9 cm. The renal cortical echotexture is similar to that of the right kidney. There is a mid to lower pole cyst measuring 1.9 cm in greatest dimension. There is no hydronephrosis, solid mass, nor evidence of stones.  Bladder: The partially distended urinary bladder is normal. IMPRESSION: No acute or significant chronic abnormality of the kidneys or urinary bladder. Electronically Signed   By: David  Martinique M.D.   On: 01/08/2016 10:05   Dg Chest Port 1 View  Result Date: 01/07/2016 CLINICAL DATA:  Hypotension and lightheadedness. EXAM: PORTABLE CHEST 1 VIEW COMPARISON:  12/18/2015 FINDINGS: Mild bibasilar atelectasis. No acute consolidation or effusion. Stable borderline cardiomegaly. Pulmonary vascularity within normal limits. No pneumothorax. IMPRESSION: Minimal bibasilar atelectasis Electronically Signed   By: Donavan Foil M.D.   On: 01/07/2016 15:20   Dg Abd Portable 1v  Result Date: 01/11/2016 CLINICAL DATA:  Generalized abdominal pain and distention. EXAM: PORTABLE ABDOMEN - 1 VIEW COMPARISON:  CT the abdomen and pelvis 05/01/2008. Renal ultrasound 01/08/2016 FINDINGS: The bowel gas pattern is normal. No radio-opaque calculi or other significant radiographic abnormality are seen. IMPRESSION: Negative one view abdomen. Electronically Signed   By: San Morelle M.D.   On: 01/11/2016 15:45        Filed Weights   01/07/16 2050 01/09/16 0410  01/12/16 1101  Weight: 79.2 kg (174 lb 9.7 oz) 77.9 kg (171 lb 11.8 oz) 77.9 kg (171 lb 11.8 oz)     Microbiology: Recent Results (from the past 240 hour(s))  Blood Culture (routine x 2)     Status: None   Collection Time: 01/07/16  2:22 PM  Result Value Ref Range Status   Specimen Description BLOOD LEFT ANTECUBITAL  Final   Special Requests BOTTLES DRAWN AEROBIC AND ANAEROBIC 5CC  Final   Culture NO GROWTH 5 DAYS  Final   Report Status 01/12/2016 FINAL  Final  Urine culture     Status: None   Collection Time: 01/07/16  4:44 PM  Result Value Ref Range Status   Specimen Description URINE, CLEAN CATCH  Final   Special Requests NONE  Final   Culture NO GROWTH  Final   Report Status 01/08/2016 FINAL  Final  Blood Culture (routine x 2)     Status:  None   Collection Time: 01/07/16  4:56 PM  Result Value Ref Range Status   Specimen Description BLOOD UNK  Final   Special Requests BOTTLES DRAWN AEROBIC AND ANAEROBIC 5CC  Final   Culture NO GROWTH 5 DAYS  Final   Report Status 01/12/2016 FINAL  Final  MRSA PCR Screening     Status: None   Collection Time: 01/07/16  8:55 PM  Result Value Ref Range Status   MRSA by PCR NEGATIVE NEGATIVE Final    Comment:        The GeneXpert MRSA Assay (FDA approved for NASAL specimens only), is one component of a comprehensive MRSA colonization surveillance program. It is not intended to diagnose MRSA infection nor to guide or monitor treatment for MRSA infections.   C difficile quick scan w PCR reflex     Status: None   Collection Time: 01/08/16  3:56 AM  Result Value Ref Range Status   C Diff antigen NEGATIVE NEGATIVE Final   C Diff toxin NEGATIVE NEGATIVE Final   C Diff interpretation No C. difficile detected.  Final  Gastrointestinal Panel by PCR , Stool     Status: None   Collection Time: 01/08/16  3:56 AM  Result Value Ref Range Status   Campylobacter species NOT DETECTED NOT DETECTED Final   Plesimonas shigelloides NOT DETECTED NOT DETECTED Final   Salmonella species NOT DETECTED NOT DETECTED Final   Yersinia enterocolitica NOT DETECTED NOT DETECTED Final   Vibrio species NOT DETECTED NOT DETECTED Final   Vibrio cholerae NOT DETECTED NOT DETECTED Final   Enteroaggregative E coli (EAEC) NOT DETECTED NOT DETECTED Final   Enteropathogenic E coli (EPEC) NOT DETECTED NOT DETECTED Final   Enterotoxigenic E coli (ETEC) NOT DETECTED NOT DETECTED Final   Shiga like toxin producing E coli (STEC) NOT DETECTED NOT DETECTED Final   Shigella/Enteroinvasive E coli (EIEC) NOT DETECTED NOT DETECTED Final   Cryptosporidium NOT DETECTED NOT DETECTED Final   Cyclospora cayetanensis NOT DETECTED NOT DETECTED Final   Entamoeba histolytica NOT DETECTED NOT DETECTED Final   Giardia lamblia NOT DETECTED  NOT DETECTED Final   Adenovirus F40/41 NOT DETECTED NOT DETECTED Final   Astrovirus NOT DETECTED NOT DETECTED Final   Norovirus GI/GII NOT DETECTED NOT DETECTED Final   Rotavirus A NOT DETECTED NOT DETECTED Final   Sapovirus (I, II, IV, and V) NOT DETECTED NOT DETECTED Final       Blood Culture    Component Value Date/Time   SDES BLOOD UNK 01/07/2016 1656   SPECREQUEST BOTTLES  DRAWN AEROBIC AND ANAEROBIC 5CC 01/07/2016 1656   CULT NO GROWTH 5 DAYS 01/07/2016 1656   REPTSTATUS 01/12/2016 FINAL 01/07/2016 1656      Labs: Results for orders placed or performed during the hospital encounter of 01/07/16 (from the past 48 hour(s))  Comprehensive metabolic panel     Status: Abnormal   Collection Time: 01/11/16  8:47 AM  Result Value Ref Range   Sodium 138 135 - 145 mmol/L   Potassium 5.5 (H) 3.5 - 5.1 mmol/L    Comment: DELTA CHECK NOTED   Chloride 112 (H) 101 - 111 mmol/L   CO2 21 (L) 22 - 32 mmol/L   Glucose, Bld 94 65 - 99 mg/dL   BUN 9 6 - 20 mg/dL   Creatinine, Ser 0.96 0.61 - 1.24 mg/dL   Calcium 7.6 (L) 8.9 - 10.3 mg/dL   Total Protein 5.5 (L) 6.5 - 8.1 g/dL   Albumin 2.2 (L) 3.5 - 5.0 g/dL   AST 89 (H) 15 - 41 U/L   ALT 126 (H) 17 - 63 U/L   Alkaline Phosphatase 73 38 - 126 U/L   Total Bilirubin 0.8 0.3 - 1.2 mg/dL   GFR calc non Af Amer >60 >60 mL/min   GFR calc Af Amer >60 >60 mL/min    Comment: (NOTE) The eGFR has been calculated using the CKD EPI equation. This calculation has not been validated in all clinical situations. eGFR's persistently <60 mL/min signify possible Chronic Kidney Disease.    Anion gap 5 5 - 15  CBC     Status: Abnormal   Collection Time: 01/11/16 12:31 PM  Result Value Ref Range   WBC 7.2 4.0 - 10.5 K/uL   RBC 3.22 (L) 4.22 - 5.81 MIL/uL   Hemoglobin 9.8 (L) 13.0 - 17.0 g/dL   HCT 29.6 (L) 39.0 - 52.0 %   MCV 91.9 78.0 - 100.0 fL   MCH 30.4 26.0 - 34.0 pg   MCHC 33.1 30.0 - 36.0 g/dL   RDW 14.2 11.5 - 15.5 %   Platelets 113 (L)  150 - 400 K/uL    Comment: REPEATED TO VERIFY PLATELET COUNT CONFIRMED BY SMEAR   Basic metabolic panel     Status: Abnormal   Collection Time: 01/11/16 12:31 PM  Result Value Ref Range   Sodium 137 135 - 145 mmol/L   Potassium 5.7 (H) 3.5 - 5.1 mmol/L   Chloride 112 (H) 101 - 111 mmol/L   CO2 21 (L) 22 - 32 mmol/L   Glucose, Bld 93 65 - 99 mg/dL   BUN 9 6 - 20 mg/dL   Creatinine, Ser 0.96 0.61 - 1.24 mg/dL   Calcium 7.9 (L) 8.9 - 10.3 mg/dL   GFR calc non Af Amer >60 >60 mL/min   GFR calc Af Amer >60 >60 mL/min    Comment: (NOTE) The eGFR has been calculated using the CKD EPI equation. This calculation has not been validated in all clinical situations. eGFR's persistently <60 mL/min signify possible Chronic Kidney Disease.    Anion gap 4 (L) 5 - 15  Protime-INR     Status: None   Collection Time: 01/11/16 12:31 PM  Result Value Ref Range   Prothrombin Time 15.1 11.4 - 15.2 seconds   INR 1.18   CBC     Status: Abnormal   Collection Time: 01/12/16  2:28 AM  Result Value Ref Range   WBC 7.4 4.0 - 10.5 K/uL   RBC 3.32 (L) 4.22 - 5.81 MIL/uL  Hemoglobin 10.0 (L) 13.0 - 17.0 g/dL   HCT 30.9 (L) 39.0 - 52.0 %   MCV 93.1 78.0 - 100.0 fL   MCH 30.1 26.0 - 34.0 pg   MCHC 32.4 30.0 - 36.0 g/dL   RDW 14.5 11.5 - 15.5 %   Platelets 126 (L) 150 - 400 K/uL  Comprehensive metabolic panel     Status: Abnormal   Collection Time: 01/12/16  2:28 AM  Result Value Ref Range   Sodium 139 135 - 145 mmol/L   Potassium 4.9 3.5 - 5.1 mmol/L   Chloride 114 (H) 101 - 111 mmol/L   CO2 18 (L) 22 - 32 mmol/L   Glucose, Bld 108 (H) 65 - 99 mg/dL   BUN 8 6 - 20 mg/dL   Creatinine, Ser 0.88 0.61 - 1.24 mg/dL   Calcium 8.0 (L) 8.9 - 10.3 mg/dL   Total Protein 6.0 (L) 6.5 - 8.1 g/dL   Albumin 2.5 (L) 3.5 - 5.0 g/dL   AST 90 (H) 15 - 41 U/L   ALT 142 (H) 17 - 63 U/L   Alkaline Phosphatase 86 38 - 126 U/L   Total Bilirubin 0.7 0.3 - 1.2 mg/dL   GFR calc non Af Amer >60 >60 mL/min   GFR calc Af  Amer >60 >60 mL/min    Comment: (NOTE) The eGFR has been calculated using the CKD EPI equation. This calculation has not been validated in all clinical situations. eGFR's persistently <60 mL/min signify possible Chronic Kidney Disease.    Anion gap 7 5 - 15  Magnesium     Status: None   Collection Time: 01/12/16  2:28 AM  Result Value Ref Range   Magnesium 1.7 1.7 - 2.4 mg/dL  Glucose, capillary     Status: None   Collection Time: 01/12/16 11:19 AM  Result Value Ref Range   Glucose-Capillary 87 65 - 99 mg/dL     Lipid Panel     Component Value Date/Time   CHOL 88 12/18/2015 1525   TRIG 127 12/18/2015 1525   HDL 17 (L) 12/18/2015 1525   CHOLHDL 5.2 12/18/2015 1525   VLDL 25 12/18/2015 1525   LDLCALC 46 12/18/2015 1525     Lab Results  Component Value Date   HGBA1C  05/02/2008    5.8 (NOTE)   The ADA recommends the following therapeutic goal for glycemic   control related to Hgb A1C measurement:   Goal of Therapy:   < 7.0% Hgb A1C   Reference: American Diabetes Association: Clinical Practice   Recommendations 2008, Diabetes Care,  2008, 31:(Suppl 1).        HPI : [name]  is a 60 y.o. 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 He presented to his PCP on the morning of 12/18/2015 with chest pain and NSTEMI, underwent coronary angiography the following morning revealing high-grade proximal LAD stenosis for which he underwent successful angioplasty. 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 His blood pressure has been high and uncontrolled. He had been compliant with his medications. Patient developed diarrhea around October 10, stated that his blood pressure was very low. He was advised to hold his metoprolol. Subsequently presented to emergency room with altered mental status,  hypotension and hypothermia and acute renal failure.  In the ED heart rate in the 50s, blood pressure 74/42, rectal temperature  95.5. He was in acute kidney injury. Hypokalemic 2.3 . Creatinine was greater than 8.0.Since being evaluated in the emergency room and has been resuscitated with fluids, warm blankets placed, patient is alert and oriented 3.. Patient developed melena on 10/13, GI consulted   HOSPITAL COURSE:    Acute renal failure:Cr >8 from baseline 1.1-1.2. Prerenal insult due to profound hypoperfusion from hypovolemia and antihypertensives. Also worsened by ACE inhibitor/HCTZ. - Nephrology consulted, patient has been on an ACE inhibitor, had an angiogram on 12/18/15. Preprocedure creatinine was 1.08. Hypotensive on arrival. Started on isotonic bicarbonate solution, had aggressive volume replacement Also needed aggressive potassium/magnesium replacement, Creatinine improved from 8.37>1.7>0.88 Holding lisinopril/HCTZ pending PCP follow-up   Melena/acute blood loss anemia - patient had abdominal pain with increased frequency of bloody stools overnight Initially thought to be   ischemic colitis, held DAPT, mild stable thrombocytopenia, discussed with Dr Faythe Dingwall of holding DAPT Ok to proceed with sedation, for GI intervention, colonoscopy   PPI   [facility] consulted -patient underwent colonoscopy on 10/16 Preparation was fair, nonthrombosed external hemorrhoids, no masses, internal hemorrhoids with fissure and likely the cause of patient's symptoms Hemoglobin has changed from 14.3 on 9/21 to 10.4>10.0. Patient did not require transfusion. Patient started on topical nitroglycerin ointment every 8 hours. Fiber supplement daily. He will warrant a full colonoscopy in the near future.  Hypotension:  resolved. Held antihypertensive medications. secondary to   GI losses. With hypoperfusion and diarrhea, concerned for ischemic colitis .  Status post aggressive fluid resuscitation   -TSH and cortisol within normal limits Blood cultures no growth so far, received Zosyn/vancomycin on 10/11, for 1 day, now discontinued No resume clonidine and metoprolol, holding lisinopril HCTZ   hiccups  Given baclofen   10 mg 3 times a day with resolution  Transaminitis-discontinued Lipitor,  liver function shows slowly rising ALT, right upper quadrant ultrasound ordered by GI-slightly contracted gallbladder without any gallstones,  patient will need further workup for elevated ALT - please check ferritin, TIBC, ANA, total IgG, SMA, can be done as outpatient .  No classic drugs he is on for drug induced liver injury but would monitor. No evidence of cirrhosis / fatty liver on imaging Continue to hold statin   Diarrhea:FOBT positive   - GIPP, C. Diff both negative  Hypokalemia/hypomagnesemia:  Continue to replete potassium/magnesium Recheck  History of NSTEMI/CAD s/p PTCA and stenting of proximal/mid LAD w/DES x2: Last month by Dr. Einar Gip Appreciate Dr. Irven Shelling  Consult recommendations Holding  DAPT due to melena, holding antihypertensive medications Restart low-dose beta blocker  Renovascular HTN: s/p R renal artery stent. Continue to hold clonidine, lisinopril/HCTZ, restart low-dose beta blocker  Tobacco use disorder: Quit x2 weeks, placed on zyban at recent discharge - Hold zyban  Dyslipidemia: HDL 17 Held statin due to transaminitis. Patient underwent right upper quadrant ultrasound Statin resumed at discharge due to recent PCI   Depression/anxiety:  - Hold sedating medications  Discharge Exam: *  Blood pressure (!) 142/79, pulse (!) 110, temperature 99.1 F (37.3 C), temperature source Oral, resp. rate 20, height '5\' 6"'  (1.676 m), weight 77.9 kg (171 lb 11.8 oz), SpO2 98 %.  General exam: Appears calm and comfortable  Respiratory system: Clear to auscultation. Respiratory effort normal. Cardiovascular system: S1 & S2 heard, RRR. No JVD, murmurs, rubs,  gallops or clicks. No pedal edema. Gastrointestinal system: Abdomen is nondistended, soft and nontender. No organomegaly or masses felt. Normal bowel sounds heard. Central nervous system: Alert and oriented. No focal neurological  deficits. Extremities: Symmetric 5 x 5 power. Skin: No rashes, lesions or ulcers Psychiatry: Judgement and insight appear normal. Mood & affect appropriate.    Follow-up Information    Ricke Hey, MD. Schedule an appointment as soon as possible for a visit in 2 day(s).   Specialty:  Family Medicine Why:  Hospital follow-up please call to make this appointment as soon as possible Contact information: [address] [place] [address] [phone]        Manus Gunning, MD. Schedule an appointment as soon as possible for a visit in 1 week(s).   Specialty:  Gastroenterology Why:  Please call office to discuss follow up appointments/next  colonoscopy Contact information: [address] [place] [address] [phone]           Signed: Reyne Dumas 01/13/2016, 8:13 AM        Time spent >45 mins

## 2016-01-13 NOTE — Discharge Instructions (Signed)
daily fiber supplement for hemorrhoids - nitroglycerin topical ointment 0.2% applied within the anal canal every 8 hours for treatment of fissure - continue aspirin - given recent DES placement and no bleeding yesterday, would resume Brilinta today - regular diet okay

## 2016-01-13 NOTE — Progress Notes (Signed)
Letter mailed 10/17. jf

## 2016-01-14 MED FILL — Medication: Qty: 1 | Status: AC

## 2016-01-19 ENCOUNTER — Encounter: Payer: Self-pay | Admitting: Gastroenterology

## 2016-01-20 NOTE — Progress Notes (Signed)
Letter mailed 10/24.jf

## 2016-02-09 ENCOUNTER — Ambulatory Visit: Payer: Medicaid Other | Admitting: Nurse Practitioner

## 2016-03-31 ENCOUNTER — Emergency Department (HOSPITAL_COMMUNITY)
Admission: EM | Admit: 2016-03-31 | Discharge: 2016-03-31 | Disposition: A | Discharge status: Home / Self Care | Payer: Medicaid Other | Attending: Emergency Medicine | Admitting: Emergency Medicine

## 2016-03-31 ENCOUNTER — Encounter (HOSPITAL_COMMUNITY): Payer: Self-pay | Admitting: Emergency Medicine

## 2016-03-31 ENCOUNTER — Emergency Department (HOSPITAL_COMMUNITY): Payer: Medicaid Other

## 2016-03-31 DIAGNOSIS — W501XXA Accidental kick by another person, initial encounter: Secondary | ICD-10-CM | POA: Diagnosis not present

## 2016-03-31 DIAGNOSIS — I252 Old myocardial infarction: Secondary | ICD-10-CM | POA: Insufficient documentation

## 2016-03-31 DIAGNOSIS — F17200 Nicotine dependence, unspecified, uncomplicated: Secondary | ICD-10-CM | POA: Insufficient documentation

## 2016-03-31 DIAGNOSIS — S20212A Contusion of left front wall of thorax, initial encounter: Secondary | ICD-10-CM | POA: Diagnosis not present

## 2016-03-31 DIAGNOSIS — Z7982 Long term (current) use of aspirin: Secondary | ICD-10-CM | POA: Insufficient documentation

## 2016-03-31 DIAGNOSIS — Z79899 Other long term (current) drug therapy: Secondary | ICD-10-CM | POA: Insufficient documentation

## 2016-03-31 DIAGNOSIS — S299XXA Unspecified injury of thorax, initial encounter: Secondary | ICD-10-CM | POA: Diagnosis present

## 2016-03-31 DIAGNOSIS — Y929 Unspecified place or not applicable: Secondary | ICD-10-CM | POA: Insufficient documentation

## 2016-03-31 DIAGNOSIS — Y939 Activity, unspecified: Secondary | ICD-10-CM | POA: Diagnosis not present

## 2016-03-31 DIAGNOSIS — Z8673 Personal history of transient ischemic attack (TIA), and cerebral infarction without residual deficits: Secondary | ICD-10-CM | POA: Insufficient documentation

## 2016-03-31 DIAGNOSIS — Y999 Unspecified external cause status: Secondary | ICD-10-CM | POA: Diagnosis not present

## 2016-03-31 DIAGNOSIS — I10 Essential (primary) hypertension: Secondary | ICD-10-CM | POA: Diagnosis not present

## 2016-03-31 DIAGNOSIS — E119 Type 2 diabetes mellitus without complications: Secondary | ICD-10-CM | POA: Diagnosis not present

## 2016-03-31 LAB — COMPREHENSIVE METABOLIC PANEL
ALT: 21 U/L (ref 17–63)
AST: 27 U/L (ref 15–41)
Albumin: 4.2 g/dL (ref 3.5–5.0)
Alkaline Phosphatase: 112 U/L (ref 38–126)
Anion gap: 10 (ref 5–15)
BUN: 18 mg/dL (ref 6–20)
CO2: 23 mmol/L (ref 22–32)
Calcium: 8.6 mg/dL — ABNORMAL LOW (ref 8.9–10.3)
Chloride: 104 mmol/L (ref 101–111)
Creatinine, Ser: 1.32 mg/dL — ABNORMAL HIGH (ref 0.61–1.24)
GFR calc Af Amer: >60 mL/min (ref >60)
GFR calc non Af Amer: 57 mL/min — ABNORMAL LOW (ref >60)
Glucose, Bld: 109 mg/dL — ABNORMAL HIGH (ref 65–99)
Potassium: 3.6 mmol/L (ref 3.5–5.1)
Sodium: 137 mmol/L (ref 135–145)
Total Bilirubin: 0.5 mg/dL (ref 0.3–1.2)
Total Protein: 7.2 g/dL (ref 6.5–8.1)

## 2016-03-31 LAB — CBC WITH DIFFERENTIAL/PLATELET
Basophils Absolute: 0 10*3/uL (ref 0.0–0.1)
Basophils Relative: 0 %
Eosinophils Absolute: 0.2 10*3/uL (ref 0.0–0.7)
Eosinophils Relative: 2 %
HCT: 39.9 % (ref 39.0–52.0)
Hemoglobin: 13.4 g/dL (ref 13.0–17.0)
Lymphocytes Relative: 37 %
Lymphs Abs: 3.2 10*3/uL (ref 0.7–4.0)
MCH: 31.3 pg (ref 26.0–34.0)
MCHC: 33.6 g/dL (ref 30.0–36.0)
MCV: 93.2 fL (ref 78.0–100.0)
Monocytes Absolute: 0.8 10*3/uL (ref 0.1–1.0)
Monocytes Relative: 9 %
Neutro Abs: 4.5 10*3/uL (ref 1.7–7.7)
Neutrophils Relative %: 52 %
Platelets: 168 10*3/uL (ref 150–400)
RBC: 4.28 MIL/uL (ref 4.22–5.81)
RDW: 14.5 % (ref 11.5–15.5)
WBC: 8.7 10*3/uL (ref 4.0–10.5)

## 2016-03-31 LAB — I-STAT TROPONIN, ED: Troponin i, poc: 0.02 ng/mL (ref 0.00–0.08)

## 2016-03-31 MED ORDER — MORPHINE SULFATE 15 MG PO TABS
15.0000 mg | ORAL_TABLET | Freq: Once | ORAL | Status: AC
  Administered 2016-03-31: 15 mg via ORAL
  Filled 2016-03-31: qty 1

## 2016-03-31 MED ORDER — MORPHINE SULFATE 15 MG PO TABS: 15.0000 mg | ORAL_TABLET | Freq: Four times a day (QID) | ORAL | 0 refills | Status: DC | PRN

## 2016-03-31 NOTE — ED Notes (Signed)
Below order not completed by MM.
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
## 2016-04-07 ENCOUNTER — Encounter (HOSPITAL_COMMUNITY): Payer: Self-pay | Admitting: *Deleted

## 2016-04-07 ENCOUNTER — Emergency Department (HOSPITAL_COMMUNITY)
Admission: EM | Admit: 2016-04-07 | Discharge: 2016-04-07 | Disposition: A | Discharge status: Home / Self Care | Payer: Medicaid Other | Attending: Emergency Medicine | Admitting: Emergency Medicine

## 2016-04-07 ENCOUNTER — Emergency Department (HOSPITAL_COMMUNITY): Payer: Medicaid Other

## 2016-04-07 DIAGNOSIS — Z8673 Personal history of transient ischemic attack (TIA), and cerebral infarction without residual deficits: Secondary | ICD-10-CM | POA: Diagnosis not present

## 2016-04-07 DIAGNOSIS — I252 Old myocardial infarction: Secondary | ICD-10-CM | POA: Insufficient documentation

## 2016-04-07 DIAGNOSIS — F17200 Nicotine dependence, unspecified, uncomplicated: Secondary | ICD-10-CM | POA: Insufficient documentation

## 2016-04-07 DIAGNOSIS — S20212D Contusion of left front wall of thorax, subsequent encounter: Secondary | ICD-10-CM

## 2016-04-07 DIAGNOSIS — Z7982 Long term (current) use of aspirin: Secondary | ICD-10-CM | POA: Insufficient documentation

## 2016-04-07 DIAGNOSIS — E119 Type 2 diabetes mellitus without complications: Secondary | ICD-10-CM | POA: Insufficient documentation

## 2016-04-07 MED ORDER — HYDROMORPHONE HCL 2 MG/ML IJ SOLN: 1.0000 mg | Freq: Once | INTRAMUSCULAR | Status: DC

## 2016-04-07 MED ORDER — OXYCODONE-ACETAMINOPHEN 5-325 MG PO TABS
2.0000 | ORAL_TABLET | Freq: Once | ORAL | Status: AC
  Administered 2016-04-07: 2 via ORAL
  Filled 2016-04-07: qty 2

## 2016-04-07 MED ORDER — NAPROXEN 500 MG PO TABS: 500.0000 mg | ORAL_TABLET | Freq: Two times a day (BID) | ORAL | 0 refills | Status: DC
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
## 2016-04-07 NOTE — ED Notes (Signed)
Called pt to be brought back to room 3x, with no response. Unable to find pt in waiting room at this time.

## 2016-04-07 NOTE — ED Notes (Signed)
Pt walked [place] [employer] [place] a snack. Pt is now in his room and ready to be assessed.

## 2016-04-07 NOTE — Discharge Instructions (Signed)
Your xray today was normal.  Take naproxen twice daily.  The ED is not able to refill narcotic pain medications.  Please go to your primary care physician tomorrow or Friday for continued management. Return to the ED for any new or concerning symptoms.
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
## 2016-04-07 NOTE — ED Notes (Signed)
Declined W/C at D/C and was escorted to lobby by RN. 

## 2016-04-22 ENCOUNTER — Ambulatory Visit (HOSPITAL_COMMUNITY)
Admission: EM | Admit: 2016-04-22 | Discharge: 2016-04-22 | Disposition: A | Discharge status: Home / Self Care | Payer: Medicaid Other | Attending: Family Medicine | Admitting: Family Medicine

## 2016-04-22 ENCOUNTER — Encounter (HOSPITAL_COMMUNITY): Payer: Self-pay | Admitting: Emergency Medicine

## 2016-04-22 DIAGNOSIS — I959 Hypotension, unspecified: Secondary | ICD-10-CM

## 2016-04-22 DIAGNOSIS — M109 Gout, unspecified: Secondary | ICD-10-CM | POA: Diagnosis not present

## 2016-04-22 MED ORDER — PREDNISONE 10 MG (21) PO TBPK
ORAL_TABLET | ORAL | 0 refills | Status: DC
Start: 2016-04-22 — End: 2019-07-10

## 2016-04-22 MED ORDER — INDOMETHACIN 50 MG PO CAPS: 50.0000 mg | ORAL_CAPSULE | Freq: Three times a day (TID) | ORAL | 0 refills | Status: AC

## 2016-04-22 NOTE — Discharge Instructions (Signed)
With regard to your low blood pressure, I would recommend you stop taking your blood pressure medicines and contact your primary care provider and try to schedule an appointment as soon as possible for evaluation. If your symptoms worsen, I would consider going to the emergency room for evaluation.  For your gout, continue to take your colchicine as prescribed, I have sent a prescription for prednisone, take 6 tablets today, then decrease by 1 each day till finished (6,5,4,3,2,1) I have also prescribed indomethacine, take one tablet three times a day with food.  This medicine can be hard on your kidneys so drink plenty of fluids.  Should your symptoms fail to resolve or worsen, follow up with your primary care provider or return to clinic as needed.

## 2016-04-22 NOTE — ED Provider Notes (Signed)
CSN: [id number]     Arrival date & time 04/22/16  1609 History   None    Chief Complaint  Patient presents with  . Gout  . Hypotension   (Consider location/radiation/quality/duration/timing/severity/associated sxs/prior Treatment) 61 year old male presents to clinic with chief complaint of left foot pain. He has past history of gout in both feet and this current attack is consistent with prior gouty attacks. He reports he has been taking his colchicine as prescribed but does not have pain medication to treat his symptoms. He does not have any fever, nausea, or other systemic symptoms at time of assessment.  He also complains of low blood pressure. He states over the last few days his systolic blood pressure has been between high 70s and low 80s. He reports he has felt weak and dizzy at times. He continues to take his BP medicine as prescribed.   The history is provided by the patient.    Past Medical History:  Diagnosis Date  . Anxiety   . Arthritis   . Dementia   . Depression   . Diabetes mellitus   . Gout   . Hypertension   . TIA (transient ischemic attack)    Past Surgical History:  Procedure Laterality Date  . CARDIAC CATHETERIZATION N/A 12/18/2015   Procedure: Left Heart Cath and Coronary Angiography;  Surgeon: Adrian Prows, MD;  Location: [facility];  Service: Cardiovascular;  Laterality: N/A;  . CARDIAC CATHETERIZATION N/A 12/18/2015   Procedure: Coronary Stent Intervention;  Surgeon: Adrian Prows, MD;  Location: [facility];  Service: Cardiovascular;  Laterality: N/A;  . CARDIAC CATHETERIZATION N/A 12/18/2015   Procedure: Coronary Balloon Angioplasty;  Surgeon: Adrian Prows, MD;  Location: [facility];  Service: Cardiovascular;  Laterality: N/A;  . COLONOSCOPY N/A 01/12/2016   Procedure: COLONOSCOPY;  Surgeon: Manus Gunning, MD;  Location: [facility];  Service: Gastroenterology;  Laterality: N/A;  . HERNIA REPAIR    . PERIPHERAL VASCULAR  CATHETERIZATION N/A 12/18/2015   Procedure: Abdominal Aortogram;  Surgeon: Adrian Prows, MD;  Location: [facility];  Service: Cardiovascular;  Laterality: N/A;   Family History  Problem Relation Age of Onset  . Heart attack Mother   . Heart attack Father   . IgA nephropathy Son    Social History  Substance Use Topics  . Smoking status: Current Every Day Smoker    Packs/day: 0.50  . Smokeless tobacco: Current User  . Alcohol use No     Comment: former, last 5 yrs    Review of Systems  Constitutional: Negative for chills and fever.  HENT: Negative.   Eyes: Negative.   Respiratory: Negative.   Cardiovascular: Negative for chest pain, palpitations and leg swelling.  Gastrointestinal: Negative.   Genitourinary: Negative.   Skin: Negative.   Neurological: Positive for dizziness, weakness and light-headedness. Negative for syncope.  All other systems reviewed and are negative.   Allergies  Vicodin [hydrocodone-acetaminophen]  Home Medications   Prior to Admission medications   Medication Sig Start Date End Date Taking? Authorizing Provider  aspirin 81 MG chewable tablet Chew 1 tablet (81 mg total) by mouth daily. 12/19/15  Yes Neldon Labella, NP  atorvastatin (LIPITOR) 80 MG tablet Take 80 mg by mouth daily. 03/26/16  Yes Historical Provider, MD  buPROPion (WELLBUTRIN SR) 150 MG 12 hr tablet Take 1 tablet (150 mg total) by mouth 2 (two) times daily. 12/19/15  Yes Neldon Labella, NP  cloNIDine (CATAPRES) 0.2 MG tablet Take  1 tablet (0.2 mg total) by mouth 3 (three) times daily. 01/13/16  Yes Reyne Dumas, MD  Colchicine 0.6 MG CAPS take 1 capsule by mouth once daily 02/12/16  Yes Historical Provider, MD  escitalopram (LEXAPRO) 20 MG tablet Take 20 mg by mouth at bedtime. 12/25/15  Yes Historical Provider, MD  lisinopril-hydrochlorothiazide (PRINZIDE,ZESTORETIC) 20-12.5 MG tablet Take 1 tablet by mouth daily.  03/17/16  Yes Historical Provider, MD  metoprolol (LOPRESSOR)  100 MG tablet Take 0.5 tablets (50 mg total) by mouth 2 (two) times daily. 01/13/16  Yes Reyne Dumas, MD  nitroGLYCERIN (NITROSTAT) 0.4 MG SL tablet Place 0.4 mg under the tongue every 5 (five) minutes as needed. For chest pain   Yes Historical Provider, MD  pantoprazole (PROTONIX) 40 MG tablet Take 1 tablet (40 mg total) by mouth daily at 6 (six) AM. 01/14/16  Yes Reyne Dumas, MD  SEROQUEL XR 400 MG 24 hr tablet Take 400 mg by mouth at bedtime.  03/29/16  Yes Historical Provider, MD  colchicine 0.6 MG tablet Take 1 tablet (0.6 mg total) by mouth daily. 05/18/11 01/07/16  Tiffany Carlota Raspberry, PA-C  indomethacin (INDOCIN) 50 MG capsule Take 1 capsule (50 mg total) by mouth 3 (three) times daily with meals. 04/22/16 04/26/16  Barnet Glasgow, NP  LORazepam (ATIVAN) 1 MG tablet Take 1 tablet (1 mg total) by mouth every morning. 08/04/11   Kingsley Spittle, MD  morphine (MSIR) 15 MG tablet Take 1 tablet (15 mg total) by mouth every 6 (six) hours as needed for severe pain. 03/31/16   Leo Grosser, MD  naproxen (NAPROSYN) 500 MG tablet Take 1 tablet (500 mg total) by mouth 2 (two) times daily. 04/07/16   Kayla Rose, PA-C  ondansetron (ZOFRAN-ODT) 4 MG disintegrating tablet DISSOLVE 1 TABLET BY MOUTH A NEEDED FOR NAUSEA 01/31/16   Historical Provider, MD  predniSONE (STERAPRED UNI-PAK 21 TAB) 10 MG (21) TBPK tablet Take 6 tablets today, then decrease by 1 each day till finished 04/22/16   Barnet Glasgow, NP  ticagrelor (BRILINTA) 90 MG TABS tablet Take 1 tablet (90 mg total) by mouth 2 (two) times daily. 12/19/15   Neldon Labella, NP   Meds Ordered and Administered this Visit  Medications - No data to display  BP 95/61 (BP Location: Left Arm)   Pulse 61   Temp 97.3 F (36.3 C) (Oral)   Resp 16   SpO2 100%  No data found.   Physical Exam  Constitutional: He is oriented to person, place, and time. He appears well-developed and well-nourished. No distress.  HENT:  Head: Normocephalic and atraumatic.    Cardiovascular: Normal rate and regular rhythm.   Pulmonary/Chest: Effort normal and breath sounds normal.  Musculoskeletal: He exhibits tenderness.       Left ankle: He exhibits no deformity. No tenderness.       Feet:  Neurological: He is alert and oriented to person, place, and time.  Skin: Skin is warm and dry. Capillary refill takes less than 2 seconds. He is not diaphoretic. There is erythema.  Psychiatric: He has a normal mood and affect.  Nursing note and vitals reviewed.   Urgent Care Course     Procedures (including critical care time)  Labs Review Labs Reviewed - No data to display  Imaging Review No results found.   Visual Acuity Review  Right Eye Distance:   Left Eye Distance:   Bilateral Distance:    Right Eye Near:   Left Eye Near:    Bilateral Near:  MDM   1. Hypotension, unspecified hypotension type   2. Acute gout of left foot, unspecified cause    With regard to your low blood pressure, I would recommend you stop taking your blood pressure medicines and contact your primary care provider and try to schedule an appointment as soon as possible for evaluation. If your symptoms worsen, I would consider going to the emergency room for evaluation.  For your gout, continue to take your colchicine as prescribed, I have sent a prescription for prednisone, take 6 tablets today, then decrease by 1 each day till finished (6,5,4,3,2,1) I have also prescribed indomethacine, take one tablet three times a day with food.  This medicine can be hard on your kidneys so drink plenty of fluids.  Should your symptoms fail to resolve or worsen, follow up with your primary care provider or return to clinic as needed.       Barnet Glasgow, NP 04/22/16 1718

## 2016-04-22 NOTE — ED Triage Notes (Signed)
Pt reports gout flare up on left foot onset last night Taking colchicine w/no relief.   Denies inj/trauma  Brought back on wheelchair  Also c/o of hypotension... BP was 72/51 around 1530  Wife at bedside.   A&O x4... NAD

## 2017-08-09 IMAGING — DX DG CHEST 2V
2 series · 2 of 2 positions shown · non-contrast
Comparison: 03/25/2009

CLINICAL DATA: Midline upper and lower chest pain for 3 days with
sweats.

EXAM:
CHEST  2 VIEW

[chest pa]
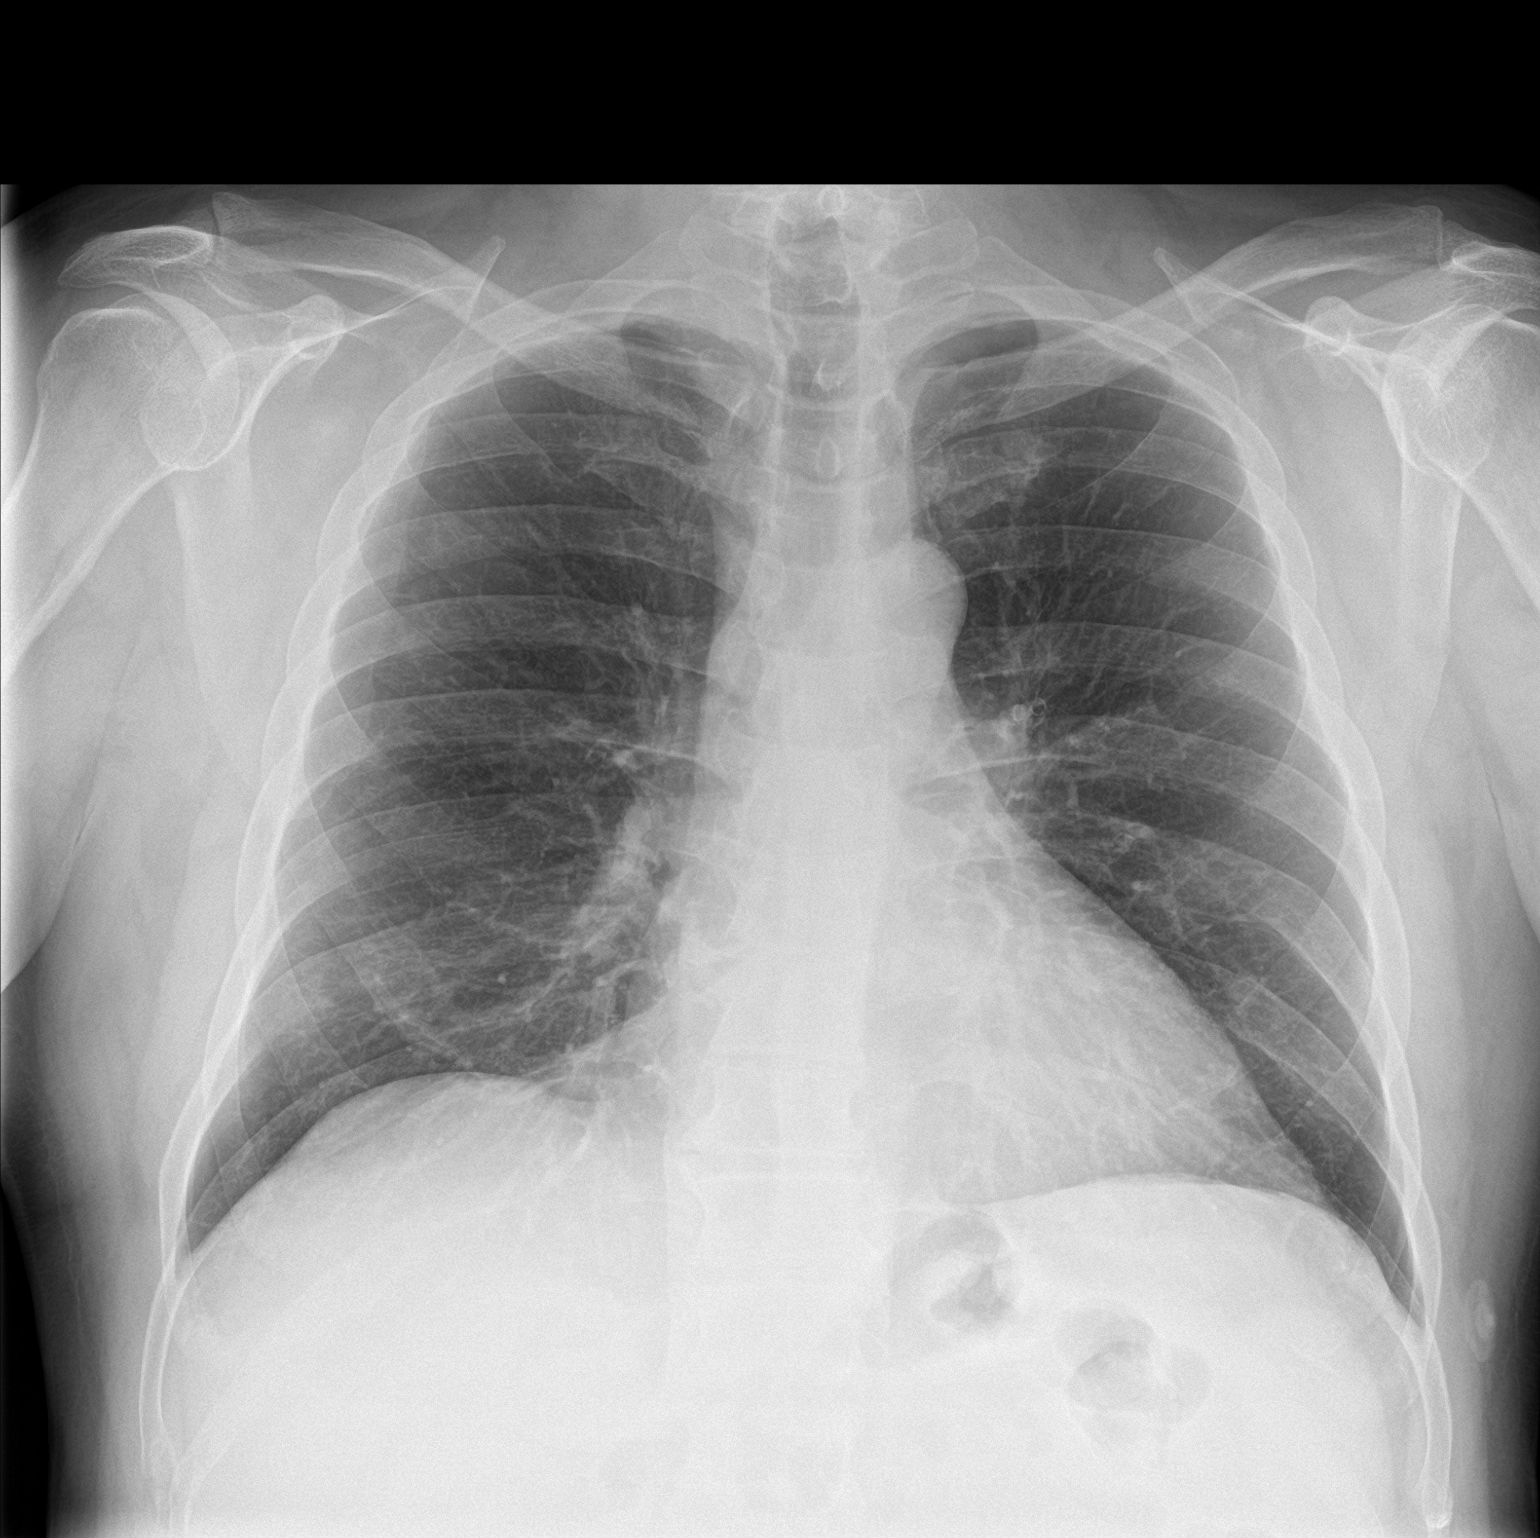

[chest lat]
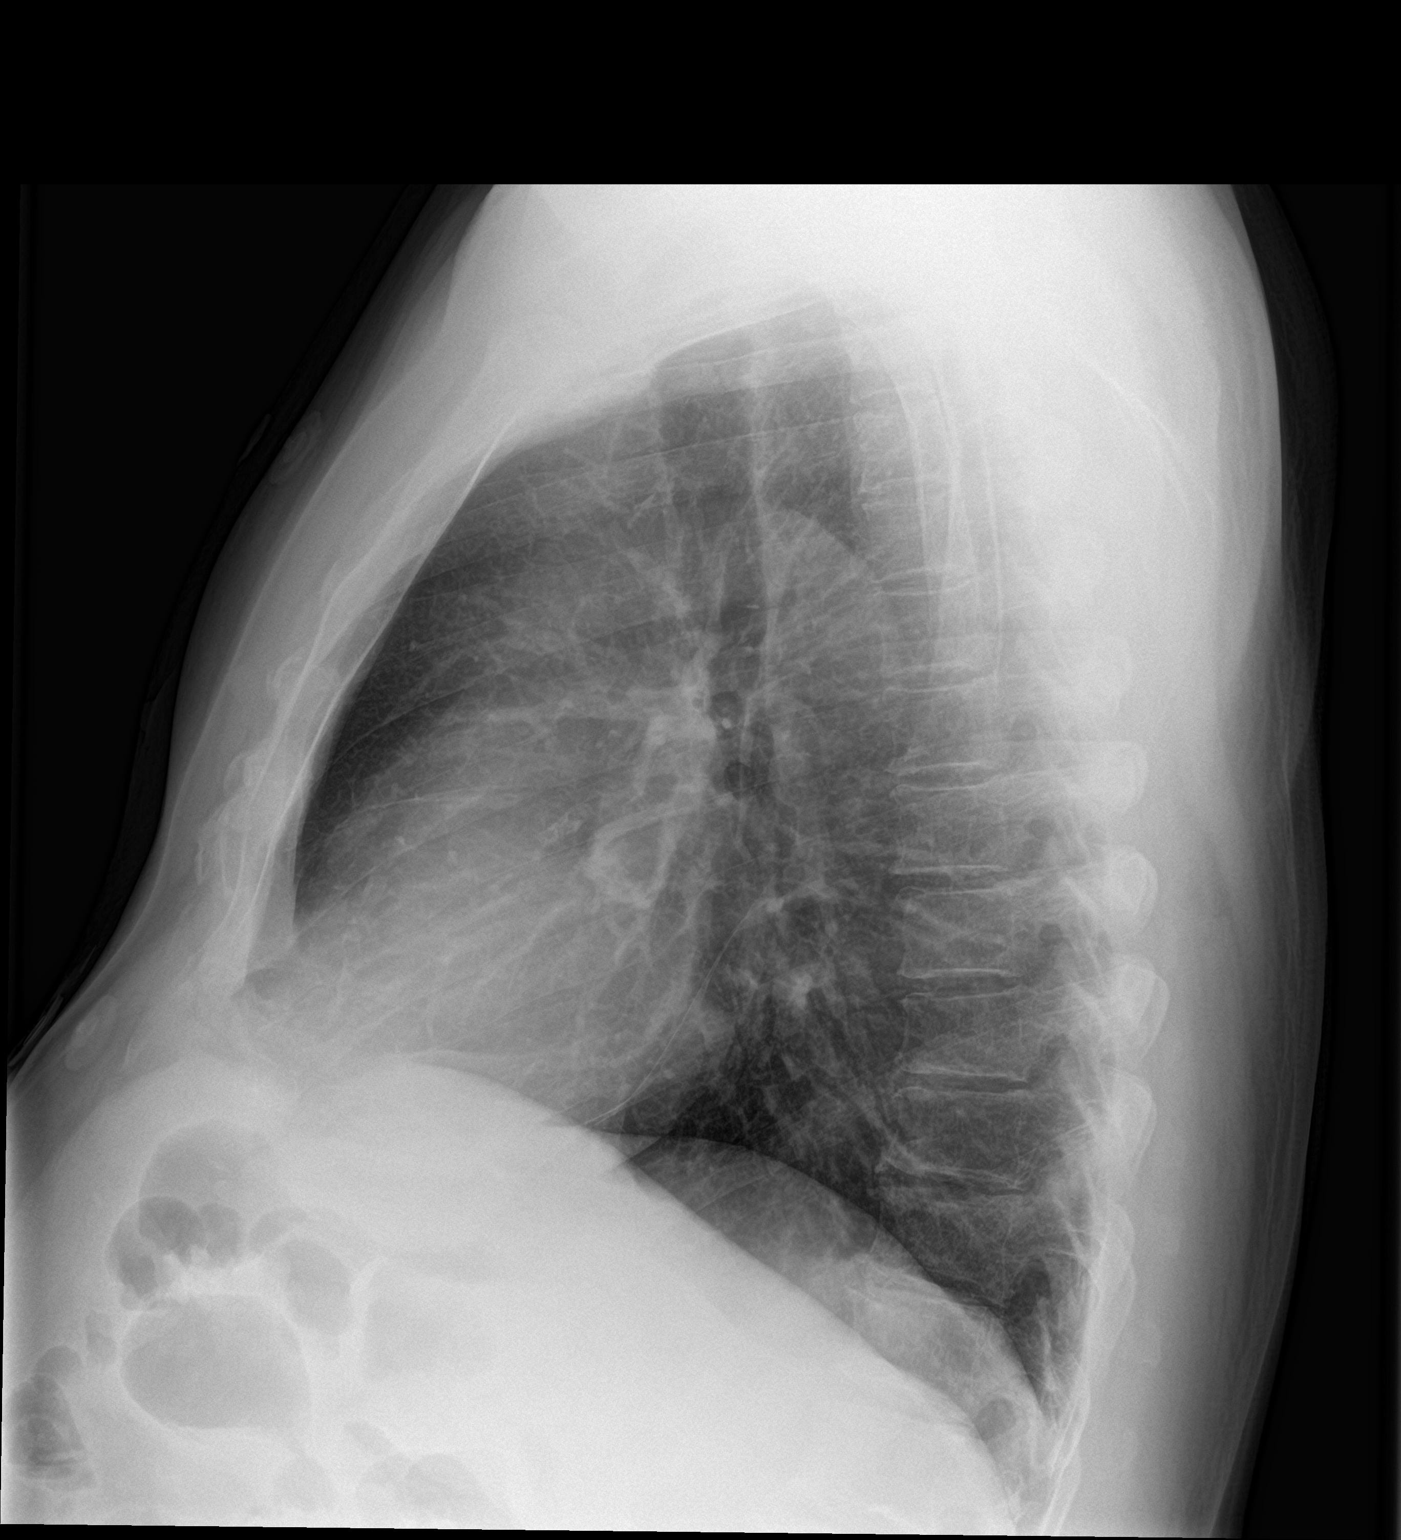

[2 of 2 positions shown; findings below may reference images not displayed]

FINDINGS: The lungs are clear wiithout focal pneumonia, edema, pneumothorax or
pleural effusion. The cardiopericardial silhouette is within normal
limits for size. The visualized bony structures of the thorax are
intact.
IMPRESSION: No active cardiopulmonary disease.

## 2017-09-02 IMAGING — CR DG ABD PORTABLE 1V
1 series · 1 of 1 positions shown · non-contrast
Comparison: CT the abdomen and pelvis 05/01/2008. Renal ultrasound
01/08/2016

CLINICAL DATA: Generalized abdominal pain and distention.

EXAM:
PORTABLE ABDOMEN - 1 VIEW

[AP]
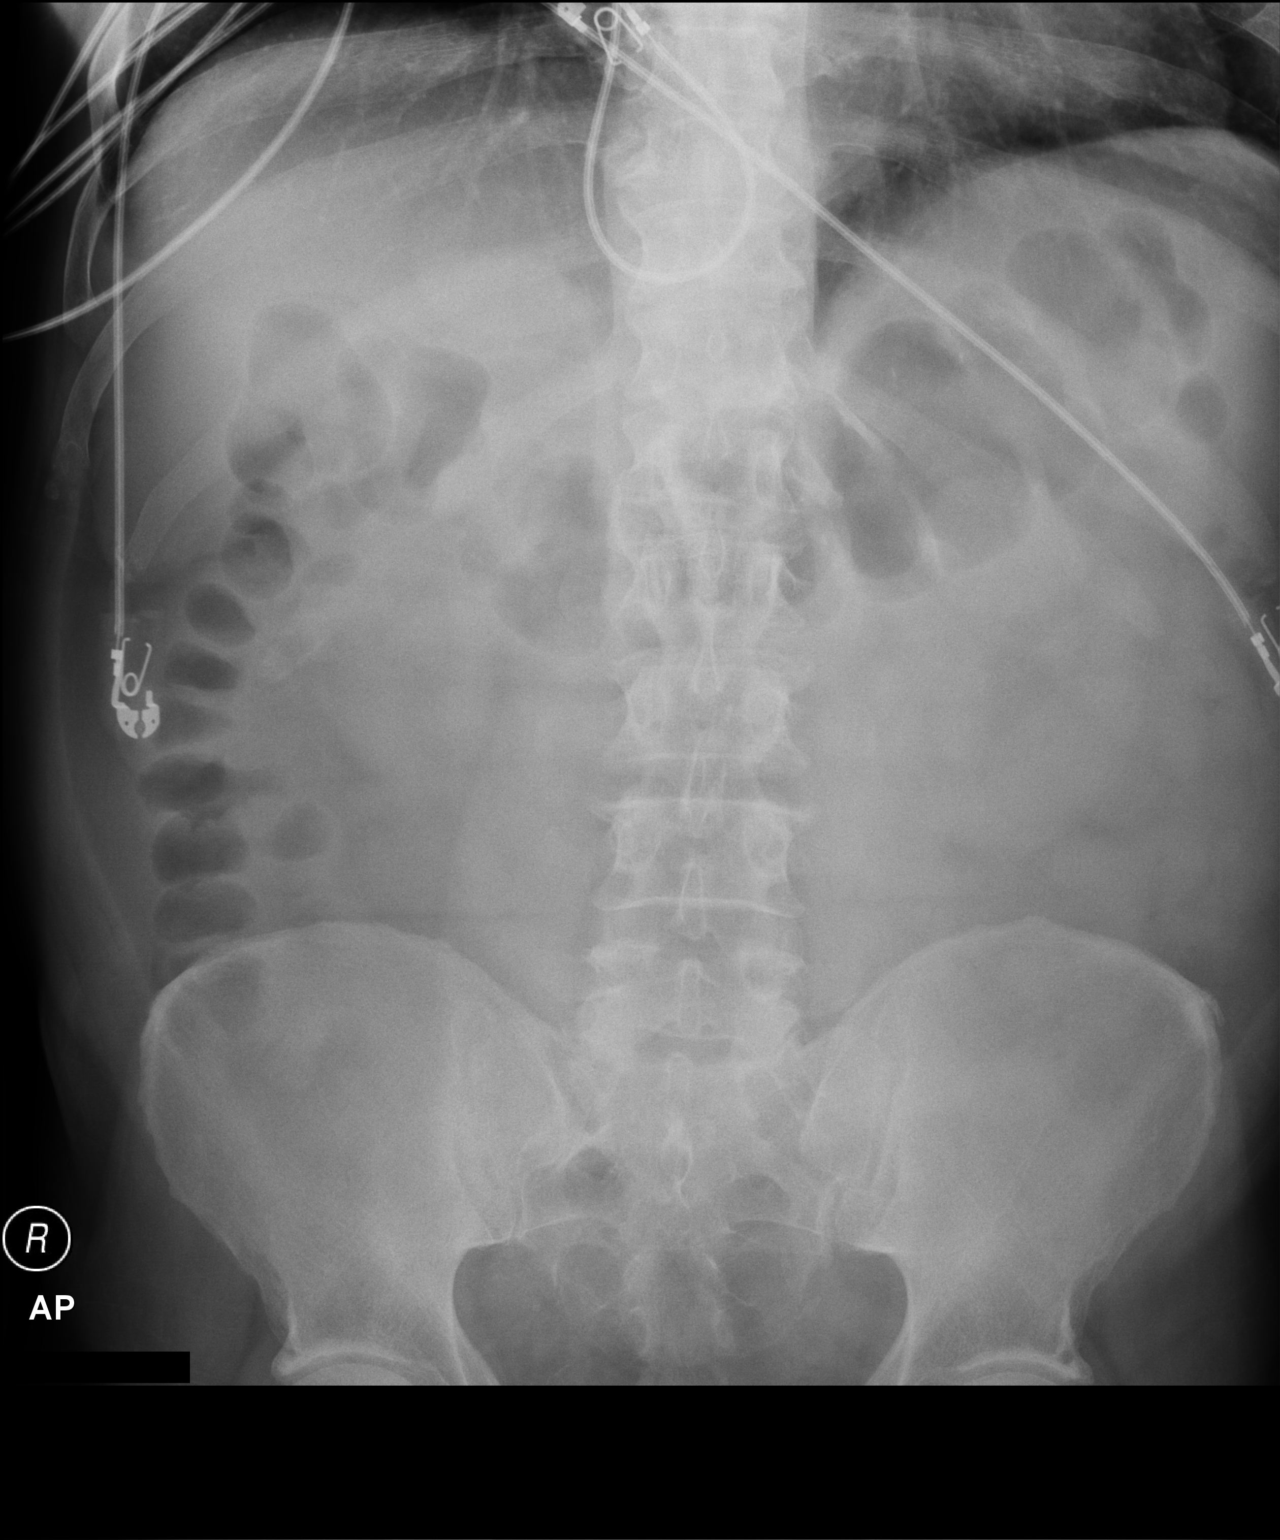

[1 of 1 positions shown; findings below may reference images not displayed]

FINDINGS: The bowel gas pattern is normal. No radio-opaque calculi or other
significant radiographic abnormality are seen.
IMPRESSION: Negative one view abdomen.

## 2018-05-26 IMAGING — US US RENAL
1 series · 14 of 25 positions shown · non-contrast
Comparison: CT scan of the abdomen and pelvis of May 01, 2008

CLINICAL DATA: Acute renal failure

EXAM:
RENAL / URINARY TRACT ULTRASOUND COMPLETE

[Series 1: us renal · 0.23mm/px · 70 acquisitions, 14 frames shown]
[im 1/70]
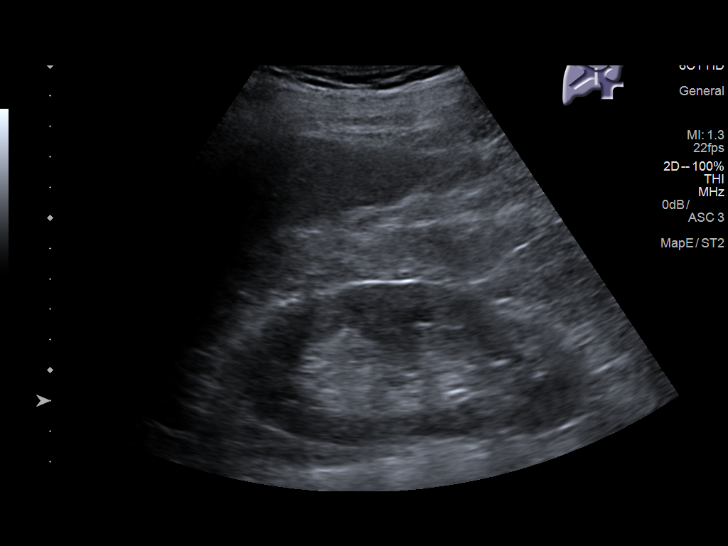
[im 6/70]
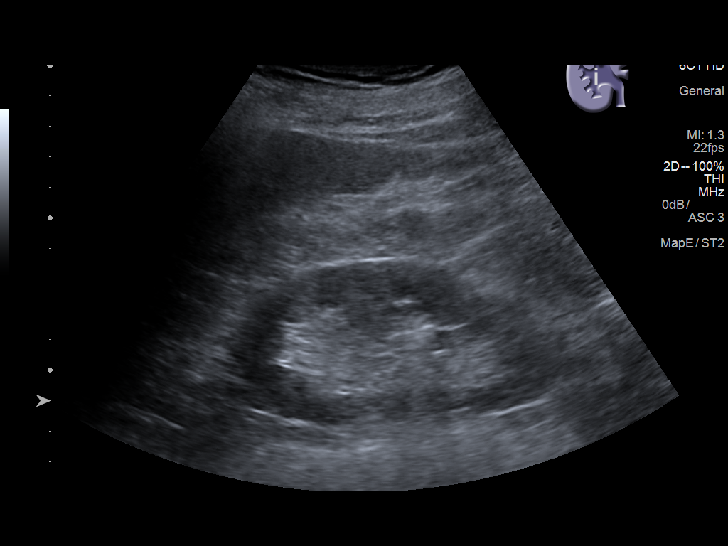
[im 12/70]
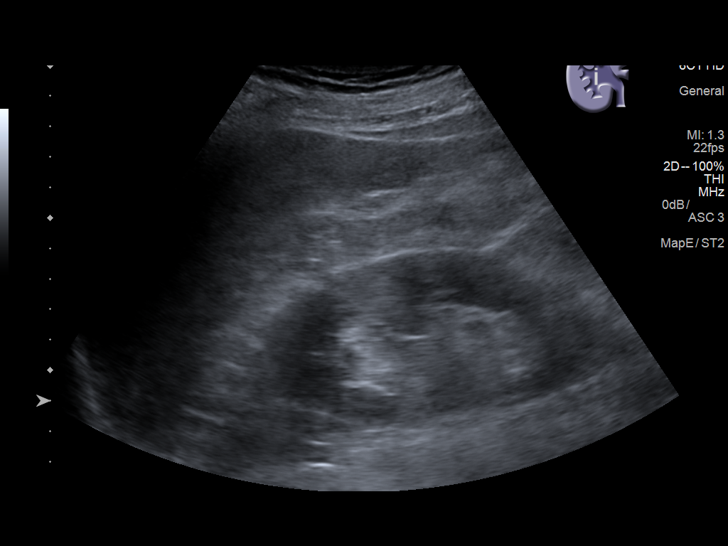
[im 18/70]
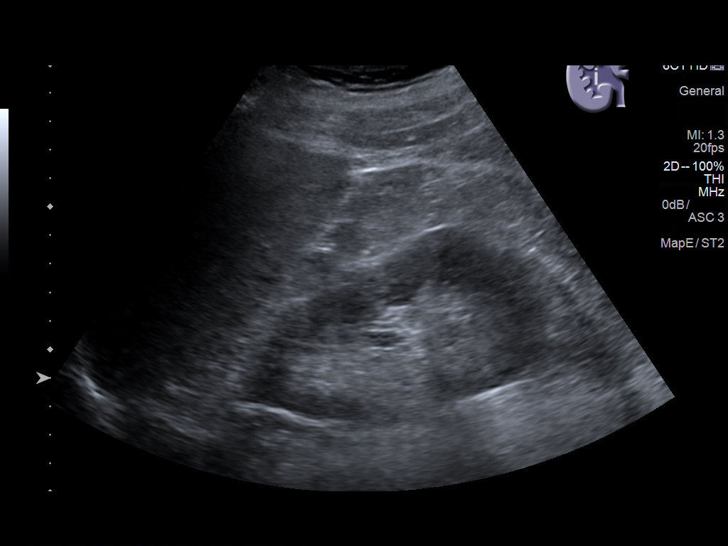
[im 24/70]
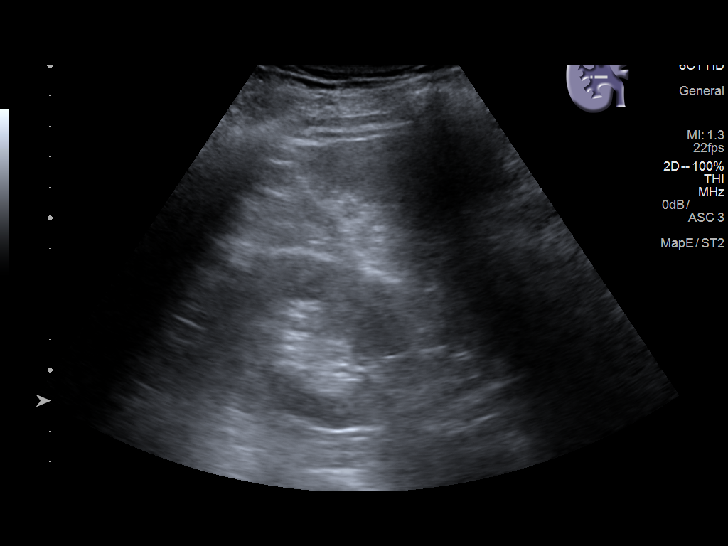
[im 26/70]
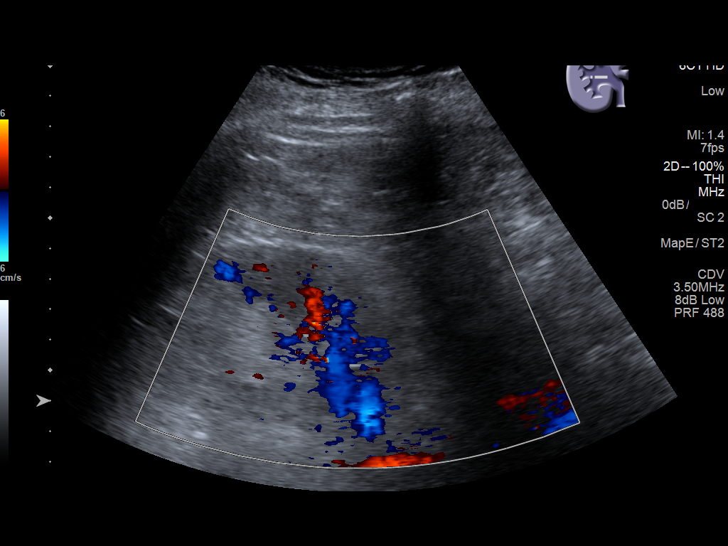
[im 32/70]
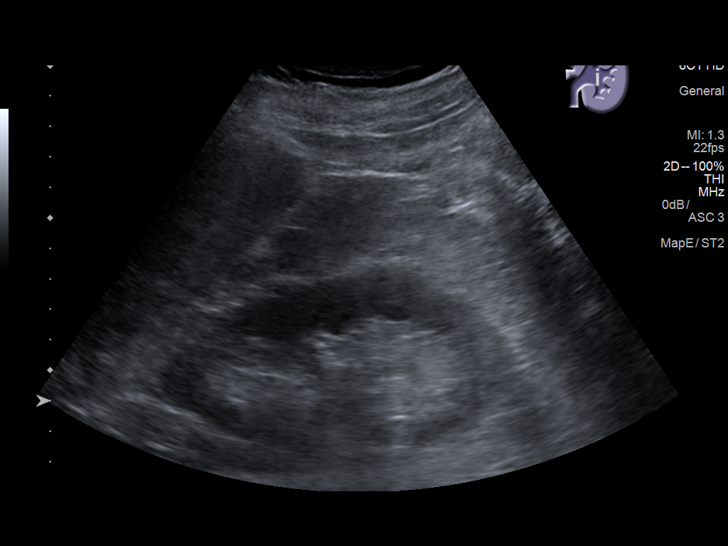
[im 38/70]
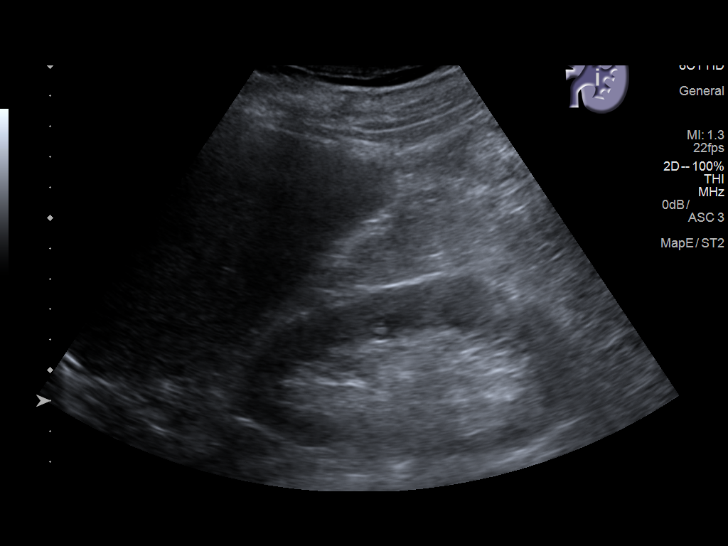
[im 44/70]
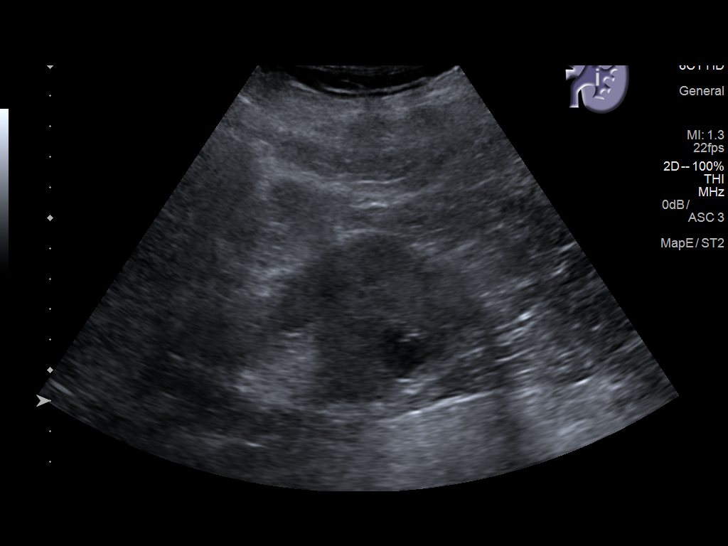
[im 47/70]
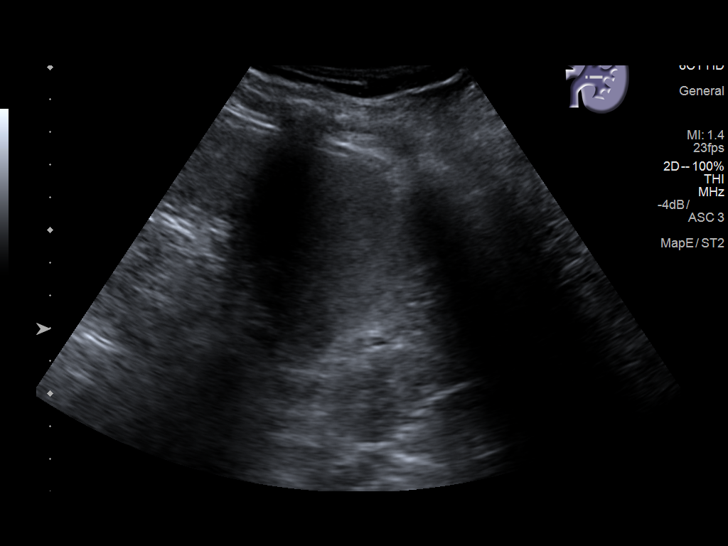
[im 52/70]
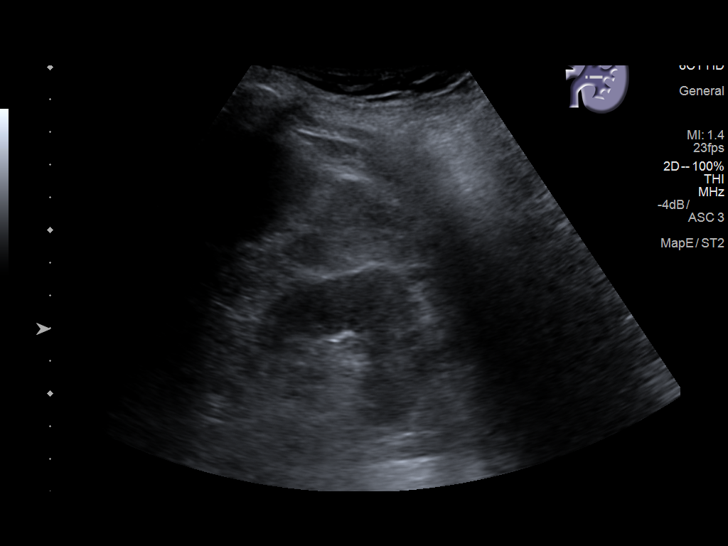
[im 58/70]
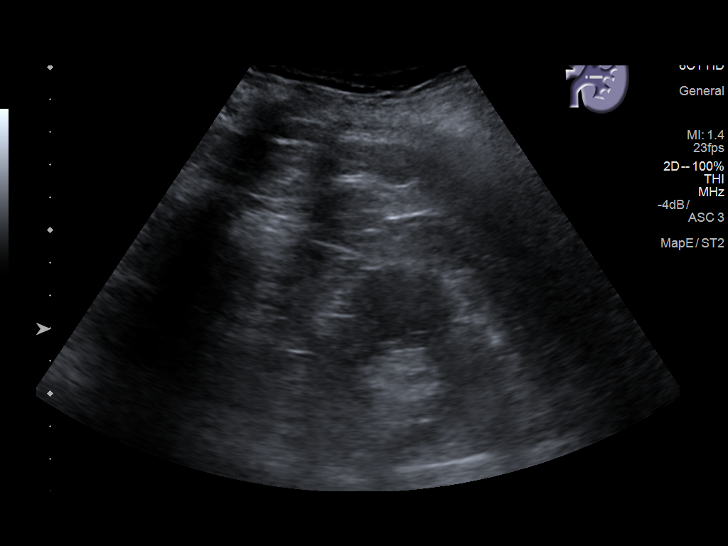
[im 64/70]
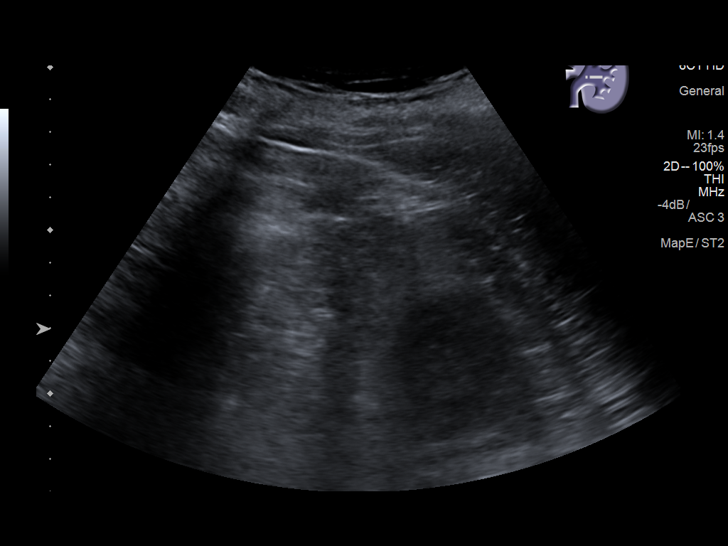
[im 70/70]
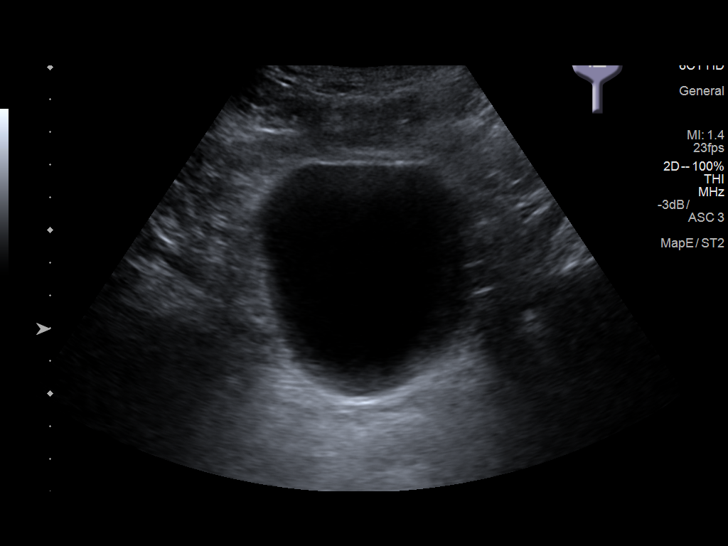

[14 of 25 positions shown; findings below may reference images not displayed]

FINDINGS: Right Kidney:

Length: 11.9 cm. The renal cortical echotexture remains lower than
that of the adjacent liver. There is no hydronephrosis. There is no
focal mass nor evidence of stones.

Left Kidney:

Length: 11.9 cm. The renal cortical echotexture is similar to that
of the right kidney. There is a mid to lower pole cyst measuring
cm in greatest dimension. There is no hydronephrosis, solid mass,
nor evidence of stones.

Bladder:

The partially distended urinary bladder is normal.
IMPRESSION: No acute or significant chronic abnormality of the kidneys or
urinary bladder.

## 2018-05-31 IMAGING — US US ABDOMEN LIMITED
1 series · 14 of 25 positions shown · non-contrast
Comparison: Ultrasound the abdomen of 01/08/2016 and CT abdomen
pelvis of 05/01/2008

CLINICAL DATA: Elevated liver function tests, diabetes,
hypertension

EXAM:
US ABDOMEN LIMITED - RIGHT UPPER QUADRANT

[Series 1: us abdomen limited · 0.20mm/px · 14 of 33 slices shown]
[im 1/33]
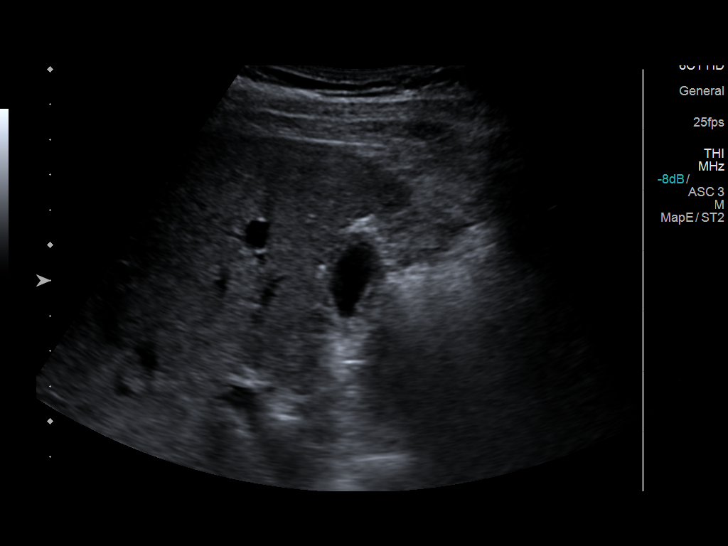
[im 3/33]
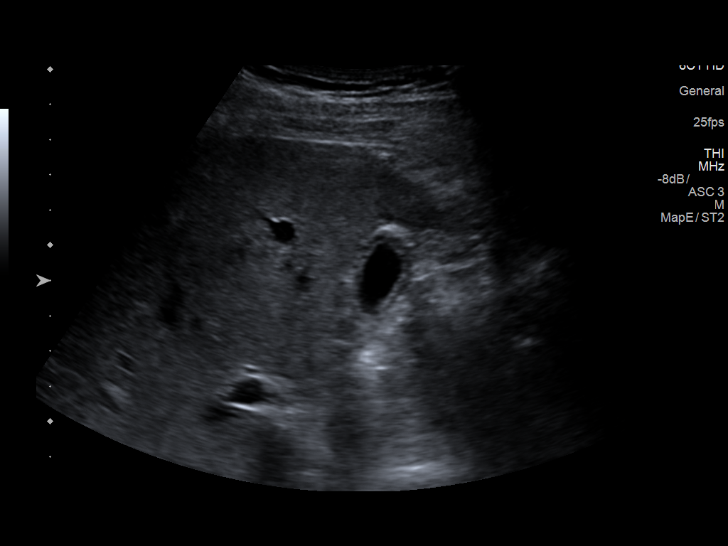
[im 6/33]
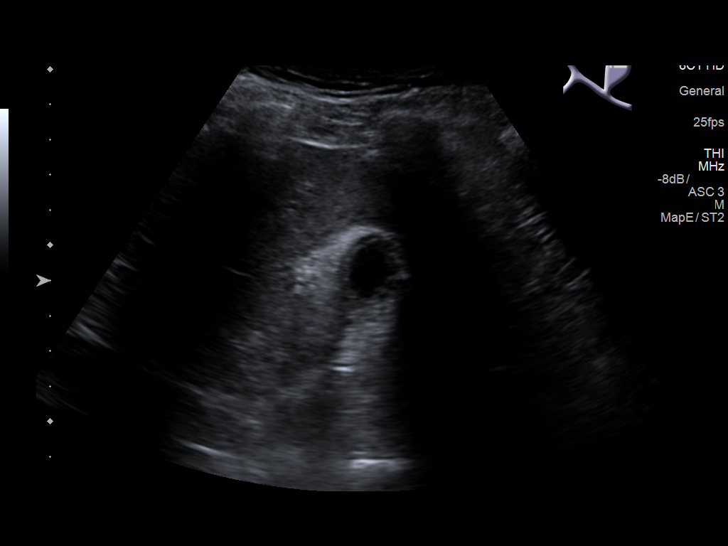
[im 9/33]
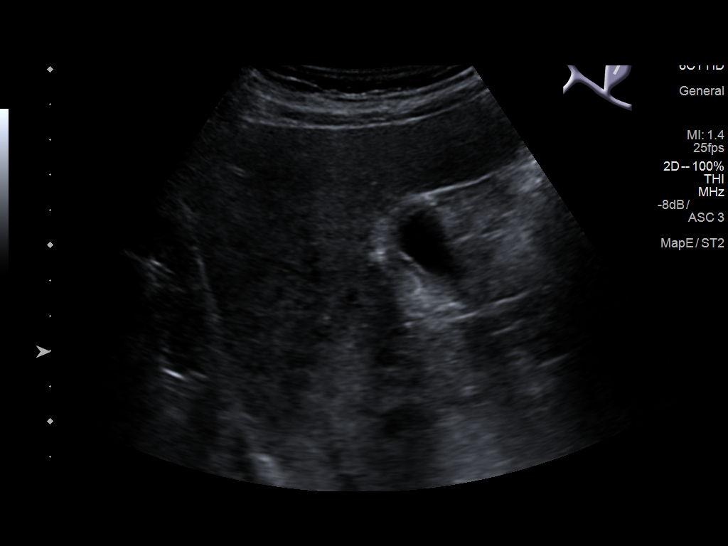
[im 11/33]
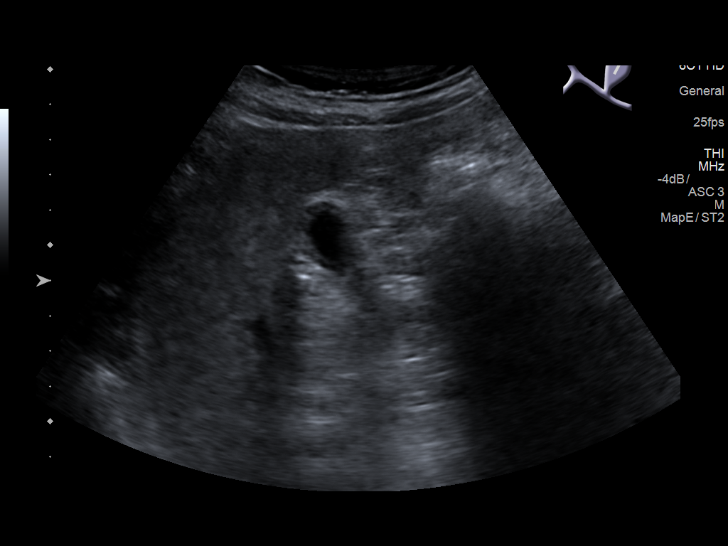
[im 13/33]
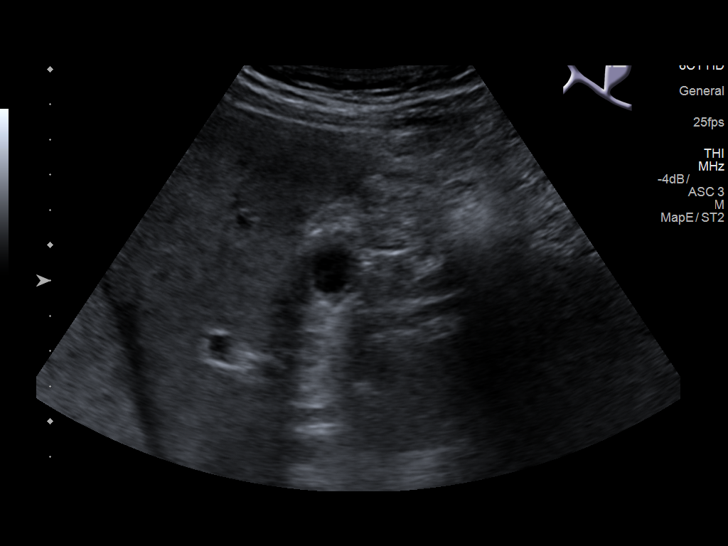
[im 15/33]
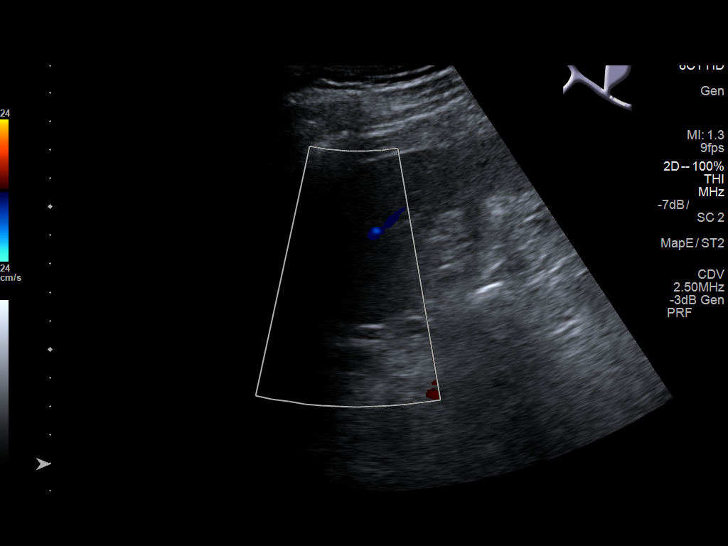
[im 18/33]
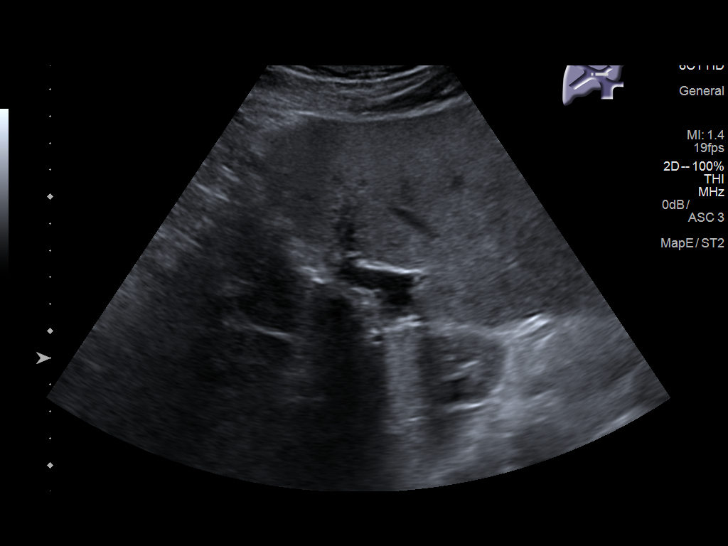
[im 21/33]
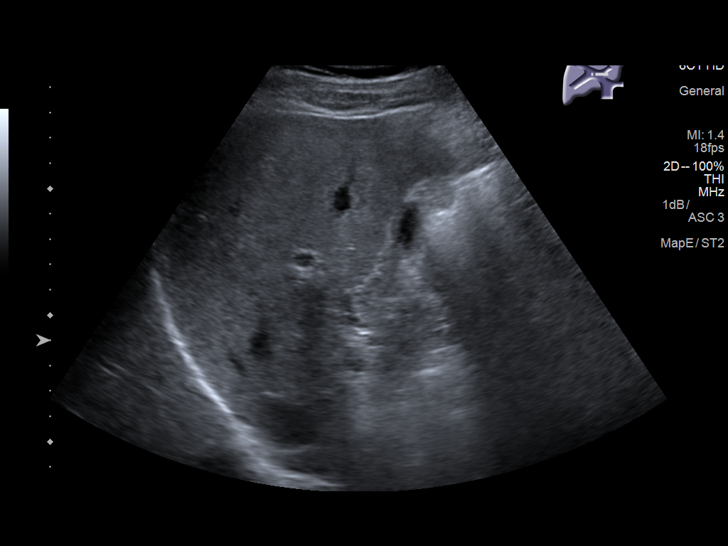
[im 22/33]
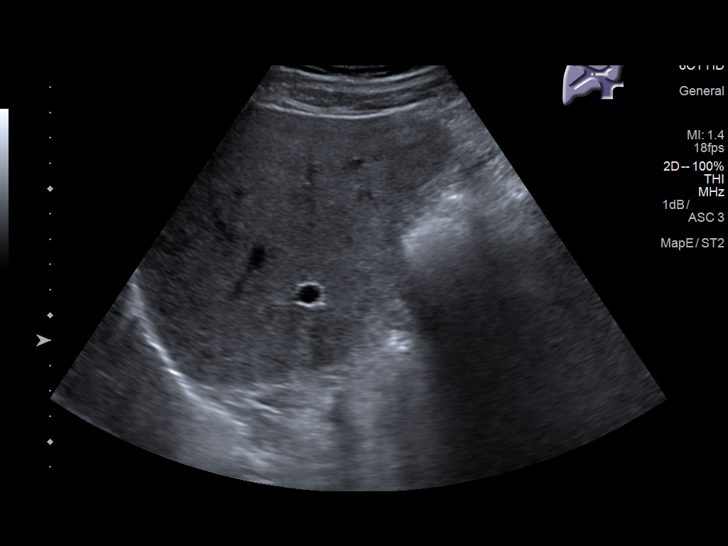
[im 25/33]
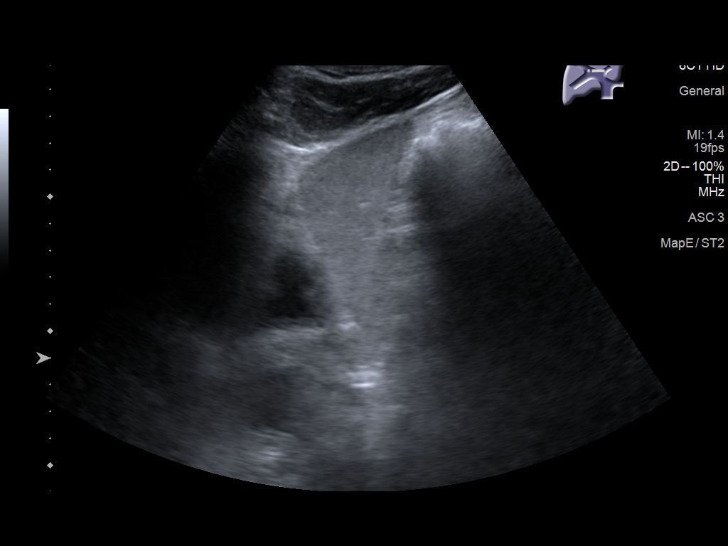
[im 27/33]
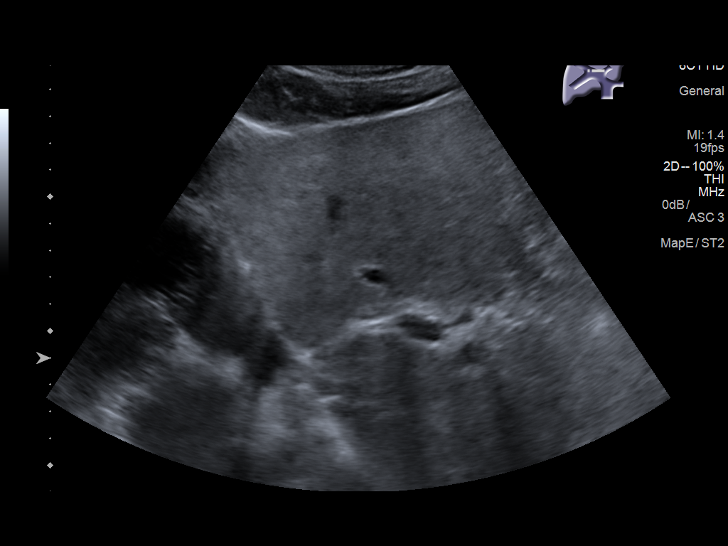
[im 30/33]
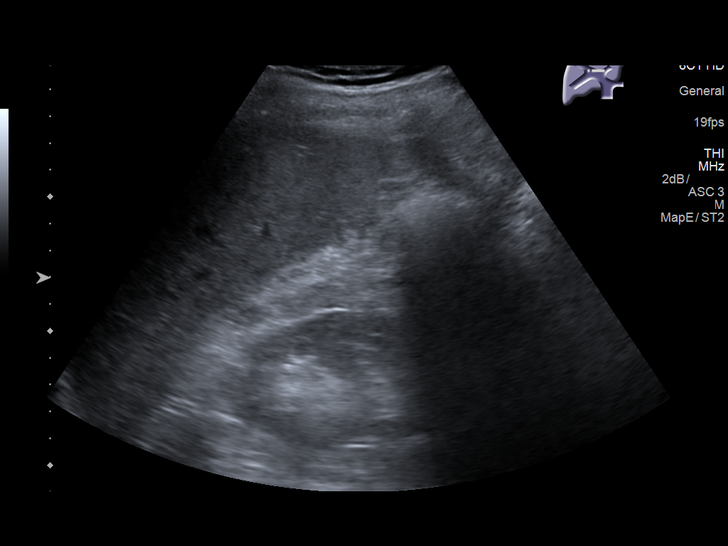
[im 33/33]
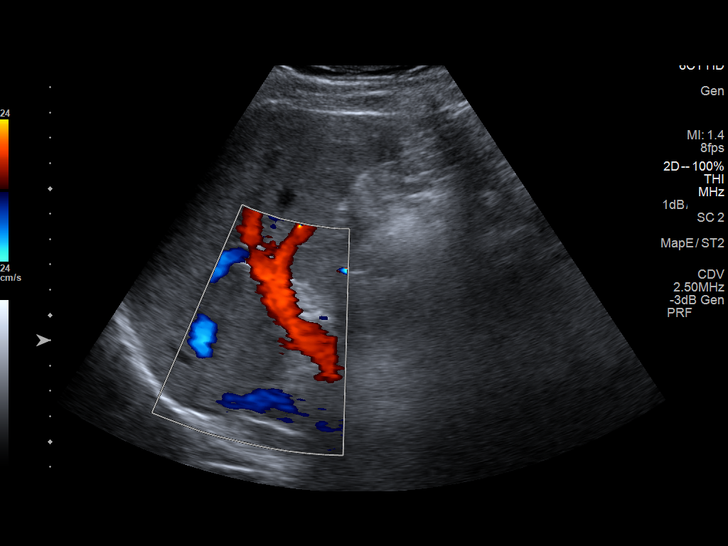

[14 of 25 positions shown; findings below may reference images not displayed]

FINDINGS: Gallbladder:

The gallbladder is somewhat contracted and therefore the wall is
slightly prominent. However there is no pain over the gallbladder
and no gallstones are noted. The slightly prominent gallbladder wall
may be due to lack of distension. No pericholecystic fluid is seen.

Common bile duct:

Diameter: The common bile duct is normal measuring 4.6 mm.

Liver:

The liver has a normal echogenic pattern. No focal hepatic
abnormality is seen. The contours of the liver are unremarkable.
IMPRESSION: 1. Slightly contracted gallbladder with minimally prominent
gallbladder wall. No pain over the gallbladder. No gallstones.
2. No abnormality of the liver is noted by ultrasound.

## 2018-06-02 ENCOUNTER — Other Ambulatory Visit: Payer: Self-pay

## 2018-06-02 MED ORDER — LOSARTAN POTASSIUM 100 MG PO TABS: 100.0000 mg | ORAL_TABLET | Freq: Every day | ORAL | 3 refills | Status: DC

## 2018-06-02 MED ORDER — HYDROCHLOROTHIAZIDE 12.5 MG PO CAPS: 12.5000 mg | ORAL_CAPSULE | Freq: Every day | ORAL | 3 refills | Status: DC

## 2018-07-04 ENCOUNTER — Other Ambulatory Visit: Payer: Self-pay | Admitting: Cardiology

## 2018-09-05 ENCOUNTER — Other Ambulatory Visit: Payer: Self-pay | Admitting: Cardiology

## 2018-09-05 DIAGNOSIS — I10 Essential (primary) hypertension: Secondary | ICD-10-CM

## 2018-09-05 NOTE — Telephone Encounter (Signed)
NO further refills if he does not come in for OV

## 2018-10-21 ENCOUNTER — Other Ambulatory Visit: Payer: Self-pay | Admitting: Cardiology

## 2018-10-21 DIAGNOSIS — I10 Essential (primary) hypertension: Secondary | ICD-10-CM

## 2018-11-07 NOTE — Telephone Encounter (Signed)
Patient needs appt for future refills

## 2019-03-02 ENCOUNTER — Other Ambulatory Visit: Payer: Self-pay

## 2019-03-02 DIAGNOSIS — I10 Essential (primary) hypertension: Secondary | ICD-10-CM

## 2019-03-02 MED ORDER — METOPROLOL TARTRATE 100 MG PO TABS: 50.0000 mg | ORAL_TABLET | Freq: Every day | ORAL | 1 refills | Status: DC

## 2019-03-15 ENCOUNTER — Other Ambulatory Visit: Payer: Self-pay

## 2019-03-15 DIAGNOSIS — I10 Essential (primary) hypertension: Secondary | ICD-10-CM

## 2019-03-15 MED ORDER — METOPROLOL TARTRATE 100 MG PO TABS: 50.0000 mg | ORAL_TABLET | Freq: Every day | ORAL | 1 refills | Status: DC

## 2019-06-27 ENCOUNTER — Other Ambulatory Visit: Payer: Self-pay | Admitting: Cardiology

## 2019-07-10 ENCOUNTER — Ambulatory Visit: Payer: Medicaid Other | Admitting: Cardiology

## 2019-07-10 ENCOUNTER — Other Ambulatory Visit: Payer: Self-pay

## 2019-07-10 ENCOUNTER — Encounter: Payer: Self-pay | Admitting: Cardiology

## 2019-07-10 VITALS — BP 165/85 | HR 64 | Temp 98.2°F | Ht 66.0 in | Wt 202.0 lb

## 2019-07-10 DIAGNOSIS — Z6832 Body mass index (BMI) 32.0-32.9, adult: Secondary | ICD-10-CM

## 2019-07-10 DIAGNOSIS — E66811 Obesity, class 1: Secondary | ICD-10-CM

## 2019-07-10 DIAGNOSIS — Z8249 Family history of ischemic heart disease and other diseases of the circulatory system: Secondary | ICD-10-CM

## 2019-07-10 DIAGNOSIS — E782 Mixed hyperlipidemia: Secondary | ICD-10-CM

## 2019-07-10 DIAGNOSIS — Z955 Presence of coronary angioplasty implant and graft: Secondary | ICD-10-CM

## 2019-07-10 DIAGNOSIS — IMO0001 Reserved for inherently not codable concepts without codable children: Secondary | ICD-10-CM

## 2019-07-10 DIAGNOSIS — R42 Dizziness and giddiness: Secondary | ICD-10-CM

## 2019-07-10 DIAGNOSIS — I252 Old myocardial infarction: Secondary | ICD-10-CM

## 2019-07-10 DIAGNOSIS — I739 Peripheral vascular disease, unspecified: Secondary | ICD-10-CM

## 2019-07-10 DIAGNOSIS — Z131 Encounter for screening for diabetes mellitus: Secondary | ICD-10-CM

## 2019-07-10 DIAGNOSIS — I10 Essential (primary) hypertension: Secondary | ICD-10-CM

## 2019-07-10 DIAGNOSIS — F17200 Nicotine dependence, unspecified, uncomplicated: Secondary | ICD-10-CM

## 2019-07-10 DIAGNOSIS — F1290 Cannabis use, unspecified, uncomplicated: Secondary | ICD-10-CM

## 2019-07-10 DIAGNOSIS — I2510 Atherosclerotic heart disease of native coronary artery without angina pectoris: Secondary | ICD-10-CM

## 2019-07-10 DIAGNOSIS — F1911 Other psychoactive substance abuse, in remission: Secondary | ICD-10-CM

## 2019-07-10 DIAGNOSIS — E6609 Other obesity due to excess calories: Secondary | ICD-10-CM

## 2019-07-10 MED ORDER — LOSARTAN POTASSIUM 50 MG PO TABS: 50.0000 mg | ORAL_TABLET | Freq: Every evening | ORAL | 0 refills | Status: DC

## 2019-07-10 MED ORDER — ATORVASTATIN CALCIUM 80 MG PO TABS: 80.0000 mg | ORAL_TABLET | Freq: Every day | ORAL | Status: DC

## 2019-07-10 NOTE — Patient Instructions (Signed)
Please remember to bring in your medication bottles in at the next visit.   New Medications that were added at today's visit:  Losartan  Medications that were discontinued at today's visit: None  Office will call you to have the following tests scheduled:  Echo. Carotid ultrasound  Please get labs done tomorrow when you are fasting and a week after starting the new medication at the nearest [facility].  Recommend follow up with your PCP as scheduled.

## 2019-07-10 NOTE — Progress Notes (Signed)
[name] Date of Birth: Oct 02, 1955 MRN: [id number] Primary Care Provider:McKenzie, Gwynneth Munson, MD  Date: 07/10/19 Last Office Visit: 02/16/2018  Chief Complaint  Patient presents with  . Follow-up    1 year  . Coronary Artery Disease    HPI  [name] is a 64 y.o.  male who presents to the office with a chief complaint of " follow-up for heart disease." Patient's past medical history and cardiovascular risk factors include: History of non-STEMI, established coronary artery disease with prior angioplasty and stent, peripheral artery disease, family history of premature coronary artery disease, mixed hyperlipidemia, active smoking, active marijuana use, history of substance abuse, hypertension, morbid obesity due to excess calories, history of TIA.  Patient was last seen in the office on February 16, 2018 by Miquel Dunn, MSN, APRN, FNP-C. At that time patient's blood pressure was not well controlled and he was asked to follow-up in 4 weeks for further medication titration. However, patient failed to follow-up and now presents more than 1 year later for management of coronary artery disease.  I am seeing the patient for the first time to reestablish care given his complex past medical history. Since last office visit patient denies any active chest pain or shortness of breath at rest or with effort related activities. His overall functional capacity has remained stable and no change in overall endurance. Patient states that the last year or so has been extremely stressful as he lost his son in February 2021 secondary to worsening kidney disease and he was on hemodialysis for 3 years. Patient states that he knows that he has complex past medical history and has not been working effectively to manage his chronic conditions.  Patient's blood pressure is not well controlled at today's office visit. Patient states that his blood pressure at home usually ranges between systolic blood  pressure of 734-193 and diastolic blood pressure between 90-100 mmHg. Patient has also stopped multiple antihypertensive medications that were listed as active meds in the past. For reasons unknown. I do not have any recent blood work for review as a part of today's office visit. Patient states that he plans to follow-up with his primary physician on Aug 12, 2019 for a full physical and blood work.  He has a significant family history of premature CAD in his father who died at age 47 from a heart attack and in his mother died at age 49 from a heart attack, 1st CABG in her 40s.  Patient's last angiogram was in September 2017 during which he underwent successful PTCA and stenting of the proximal and mid LAD with 2 overlapping 4.0 x 22 and 3.5 x 12 mm resolute integrity DES, stenosis reduced from 99% to 0%, and PTCA and balloon angioplasty of the distal PDA with a 2.5 x 15 mm Emerge with reduction of stenosis from 90% to less than 30%.  Patient states that he also has peripheral vascular disease with prior interventions done back in [place]. He currently does not have any symptoms of claudication. Prior ABIs illustrate decreased perfusion bilaterally with ABI of 0.7.  Patient has a history of polysubstance abuse in the past. And he actively smokes cigarettes and marijuana on a daily basis.  History of NSTEMI, coronary artery disease with prior PCIs, peripheral artery disease with prior interventions, transient ischemic attack. Denies prior history of congestive heart failure, deep venous thrombosis, pulmonary embolism, stroke.  FUNCTIONAL STATUS: He mows 10 yards without effort related symptoms.    ALLERGIES: Allergies  Allergen Reactions  . Vicodin [Hydrocodone-Acetaminophen] Nausea And Vomiting   MEDICATION LIST PRIOR TO VISIT: Current Outpatient Medications on File Prior to Visit  Medication Sig Dispense Refill  . allopurinol (ZYLOPRIM) 100 MG tablet Take 100 mg by mouth daily.    Marland Kitchen amLODipine  (NORVASC) 10 MG tablet Take 10 mg by mouth daily.    Marland Kitchen aspirin 81 MG chewable tablet Chew 1 tablet (81 mg total) by mouth daily. 30 tablet 1  . BRILINTA 60 MG TABS tablet TAKE 1 TABLET BY MOUTH TWICE DAILY 180 tablet 3  . Colchicine 0.6 MG CAPS take 1 capsule by mouth once daily  0  . metoprolol tartrate (LOPRESSOR) 100 MG tablet Take 0.5 tablets (50 mg total) by mouth daily. 30 tablet 1  . nitroGLYCERIN (NITROSTAT) 0.4 MG SL tablet Place 0.4 mg under the tongue every 5 (five) minutes as needed. For chest pain     No current facility-administered medications on file prior to visit.    PAST MEDICAL HISTORY: Past Medical History:  Diagnosis Date  . Anxiety   . Arthritis   . Coronary artery disease   . Dementia ([place])   . Depression   . Diabetes mellitus   . Gout   . Hyperlipidemia   . Hypertension   . PAD (peripheral artery disease) ([place])   . TIA (transient ischemic attack)     PAST SURGICAL HISTORY: Past Surgical History:  Procedure Laterality Date  . CARDIAC CATHETERIZATION N/A 12/18/2015   Procedure: Left Heart Cath and Coronary Angiography;  Surgeon: Adrian Prows, MD;  Location: [facility];  Service: Cardiovascular;  Laterality: N/A;  . CARDIAC CATHETERIZATION N/A 12/18/2015   Procedure: Coronary Stent Intervention;  Surgeon: Adrian Prows, MD;  Location: [facility];  Service: Cardiovascular;  Laterality: N/A;  . CARDIAC CATHETERIZATION N/A 12/18/2015   Procedure: Coronary Balloon Angioplasty;  Surgeon: Adrian Prows, MD;  Location: [facility];  Service: Cardiovascular;  Laterality: N/A;  . COLONOSCOPY N/A 01/12/2016   Procedure: COLONOSCOPY;  Surgeon: Manus Gunning, MD;  Location: [facility];  Service: Gastroenterology;  Laterality: N/A;  . HERNIA REPAIR    . PERIPHERAL VASCULAR CATHETERIZATION N/A 12/18/2015   Procedure: Abdominal Aortogram;  Surgeon: Adrian Prows, MD;  Location: [facility];  Service: Cardiovascular;  Laterality: N/A;    FAMILY  HISTORY: The patient's family history includes Heart attack in his father and mother; IgA nephropathy in his son.   SOCIAL HISTORY:  The patient  reports that he has been smoking. He has been smoking about 0.50 packs per day. He uses smokeless tobacco. He reports current alcohol use. He reports current drug use. Drug: Marijuana.  Review of Systems  Constitution: Negative for chills and fever.  HENT: Negative for ear discharge, ear pain and nosebleeds.   Eyes: Negative for blurred vision and discharge.  Cardiovascular: Negative for chest pain, claudication, dyspnea on exertion, leg swelling, near-syncope, orthopnea, palpitations, paroxysmal nocturnal dyspnea and syncope.  Respiratory: Negative for cough and shortness of breath.   Endocrine: Negative for polydipsia, polyphagia and polyuria.  Hematologic/Lymphatic: Negative for bleeding problem.  Skin: Negative for flushing and nail changes.  Musculoskeletal: Negative for muscle cramps, muscle weakness and myalgias.  Gastrointestinal: Negative for abdominal pain, dysphagia, hematemesis, hematochezia, melena, nausea and vomiting.  Neurological: Positive for dizziness. Negative for focal weakness and light-headedness.    PHYSICAL EXAM: Vitals with BMI 07/10/2019 04/22/2016 04/07/2016  Height _0  - -  Weight 202 lbs - -  BMI  38.17 - -  Systolic 711 95 99  Diastolic 85 61 60  Pulse 64 61 56   CONSTITUTIONAL: Well-developed and well-nourished. No acute distress.  SKIN: Skin is warm and dry. No rash noted. No cyanosis. No pallor. No jaundice HEAD: Normocephalic and atraumatic.  EYES: No scleral icterus MOUTH/THROAT: Moist oral membranes.  NECK: No JVD present. No thyromegaly noted. No carotid bruits  LYMPHATIC: No visible cervical adenopathy.  CHEST Normal respiratory effort. No intercostal retractions  LUNGS: Decreased breath sounds bilaterally.  No stridor. No wheezes. No rales.  CARDIOVASCULAR: Regular rate and rhythm, positive S1-S2,  no murmurs rubs or gallops appreciated. ABDOMINAL: No apparent ascites.  EXTREMITIES: No peripheral edema.  Warm to touch bilaterally.  Decreased hair distribution on the lateral aspects of both lower extremities.  Decrease posterior tibial pulses bilaterally.  2+ dorsalis pedis bilaterally. HEMATOLOGIC: No significant bruising NEUROLOGIC: Oriented to person, place, and time. Nonfocal. Normal muscle tone.  PSYCHIATRIC: Normal mood and affect. Normal behavior. Cooperative  CARDIAC DATABASE: EKG: 07/10/2019: Normal sinus rhythm with a ventricular rate of 63 bpm, normal axis deviation, no evidence of underlying ischemia or injury pattern.  Echocardiogram: 02/25/2016: Mild to moderate concentric hypertrophy, LVEF 65%, mildly dilated left atrium, mild to moderate MR, mild TR, no pulmonary hypertension.  Stress Testing:   More than 5 years ago an outside facility (does not recall where).   Heart Catheterization: 12/18/2015 and successful PTCA and stenting of the proximal and mid LAD with 2 overlapping 4.0 x 22 and 3.5 x 12 mm resolute integrity DES, stenosis reduced from 99% to 0%, and PTCA and balloon angioplasty of the distal PDA with a 2.5 x 15 mm Emerge with reduction of stenosis from 90% to less than 30%.  Carotid duplex: None   Ankle-brachial index: 02/28/2016: Mildly decreased perfusion of the bilateral lower extremities. ABI 0.72 noted at the posterior tibial artery level. Moderately abnormal waveforms bilaterally.  LABORATORY DATA: CBC Latest Ref Rng & Units 03/31/2016 01/12/2016 01/11/2016  WBC 4.0 - 10.5 K/uL 8.7 7.4 7.2  Hemoglobin 13.0 - 17.0 g/dL 13.4 10.0(L) 9.8(L)  Hematocrit 39.0 - 52.0 % 39.9 30.9(L) 29.6(L)  Platelets 150 - 400 K/uL 168 126(L) 113(L)    CMP Latest Ref Rng & Units 03/31/2016 01/12/2016 01/11/2016  Glucose 65 - 99 mg/dL 109(H) 108(H) 93  BUN 6 - 20 mg/dL _0 Creatinine 0.61 - 1.24 mg/dL 1.32(H) 0.88 0.96  Sodium 135 - 145 mmol/L 137 139 137  Potassium  3.5 - 5.1 mmol/L 3.6 4.9 5.7(H)  Chloride 101 - 111 mmol/L 104 114(H) 112(H)  CO2 22 - 32 mmol/L 23 18(L) 21(L)  Calcium 8.9 - 10.3 mg/dL 8.6(L) 8.0(L) 7.9(L)  Total Protein 6.5 - 8.1 g/dL 7.2 6.0(L) -  Total Bilirubin 0.3 - 1.2 mg/dL 0.5 0.7 -  Alkaline Phos 38 - 126 U/L 112 86 -  AST 15 - 41 U/L 27 90(H) -  ALT 17 - 63 U/L 21 142(H) -    Lipid Panel     Component Value Date/Time   CHOL 88 12/18/2015 1525   TRIG 127 12/18/2015 1525   HDL 17 (L) 12/18/2015 1525   CHOLHDL 5.2 12/18/2015 1525   VLDL 25 12/18/2015 1525   LDLCALC 46 12/18/2015 1525   Cardiac Panel (last 3 results) No results for input(s): CKTOTAL, CKMB, TROPONINIHS, RELINDX in the last 72 hours.  FINAL MEDICATION LIST END OF ENCOUNTER: Meds ordered this encounter  Medications  . atorvastatin (LIPITOR) 80 MG tablet  Sig: Take 1 tablet (80 mg total) by mouth at bedtime.  Marland Kitchen losartan (COZAAR) 50 MG tablet    Sig: Take 1 tablet (50 mg total) by mouth every evening.    Dispense:  30 tablet    Refill:  0    Medications Discontinued During This Encounter  Medication Reason  . atorvastatin (LIPITOR) 80 MG tablet   . cloNIDine (CATAPRES) 0.2 MG tablet Patient Preference  . buPROPion (WELLBUTRIN SR) 150 MG 12 hr tablet Patient Preference  . colchicine 0.6 MG tablet Duplicate  . escitalopram (LEXAPRO) 20 MG tablet Patient Preference  . hydrochlorothiazide (HYDRODIURIL) 12.5 MG tablet Patient Preference  . hydrochlorothiazide (MICROZIDE) 12.5 MG capsule Patient Preference  . LORazepam (ATIVAN) 1 MG tablet Patient Preference  . losartan (COZAAR) 100 MG tablet Patient Preference  . morphine (MSIR) 15 MG tablet Patient Preference  . naproxen (NAPROSYN) 500 MG tablet Patient Preference  . ondansetron (ZOFRAN-ODT) 4 MG disintegrating tablet Patient Preference  . pantoprazole (PROTONIX) 40 MG tablet Patient Preference  . predniSONE (STERAPRED UNI-PAK 21 TAB) 10 MG (21) TBPK tablet Patient Preference  . SEROQUEL XR 400 MG  24 hr tablet Patient Preference  . ticagrelor (BRILINTA) 90 MG TABS tablet Duplicate     Current Outpatient Medications:  .  allopurinol (ZYLOPRIM) 100 MG tablet, Take 100 mg by mouth daily., Disp: , Rfl:  .  amLODipine (NORVASC) 10 MG tablet, Take 10 mg by mouth daily., Disp: , Rfl:  .  aspirin 81 MG chewable tablet, Chew 1 tablet (81 mg total) by mouth daily., Disp: 30 tablet, Rfl: 1 .  atorvastatin (LIPITOR) 80 MG tablet, Take 1 tablet (80 mg total) by mouth at bedtime., Disp: , Rfl:  .  BRILINTA 60 MG TABS tablet, TAKE 1 TABLET BY MOUTH TWICE DAILY, Disp: 180 tablet, Rfl: 3 .  Colchicine 0.6 MG CAPS, take 1 capsule by mouth once daily, Disp: , Rfl: 0 .  metoprolol tartrate (LOPRESSOR) 100 MG tablet, Take 0.5 tablets (50 mg total) by mouth daily., Disp: 30 tablet, Rfl: 1 .  losartan (COZAAR) 50 MG tablet, Take 1 tablet (50 mg total) by mouth every evening., Disp: 30 tablet, Rfl: 0 .  nitroGLYCERIN (NITROSTAT) 0.4 MG SL tablet, Place 0.4 mg under the tongue every 5 (five) minutes as needed. For chest pain, Disp: , Rfl:   IMPRESSION:    ICD-10-CM   1. Atherosclerosis of native coronary artery of native heart without angina pectoris  I25.10 EKG 12-Lead    CMP14+EGFR    PCV ECHOCARDIOGRAM COMPLETE  2. Hx of non-ST elevation myocardial infarction (NSTEMI)  I25.2   3. History of coronary angioplasty with insertion of stent  Z95.5   4. Peripheral artery disease (H[facility])  I73.9   5. Family history of premature CAD  Z82.49   6. Mixed hyperlipidemia  E78.2 Lipid Panel With LDL/HDL Ratio    LDL cholesterol, direct    Apolipoprotein B    Lipoprotein A (LPA)    Lipid Panel With LDL/HDL Ratio  7. Smoking  F17.200   8. Marijuana smoker  F12.90   9. Hx of substance abuse ([place])  F19.11   10. Class 1 obesity due to excess calories with serious comorbidity and body mass index (BMI) of 32.0 to 32.9 in adult  E66.09    Z68.32   11. Screening for diabetes mellitus (DM)  Z13.1 HgB A1c  12. Benign  hypertension  I10 losartan (COZAAR) 50 MG tablet    Basic metabolic panel    Basic  metabolic panel  13. Dizziness  R42 PCV CAROTID DUPLEX (BILATERAL)     RECOMMENDATIONS: JERSON FURUKAWA is a 64 y.o. male whose past medical history and cardiovascular risk factors include: History of non-STEMI, established coronary artery disease with prior angioplasty and stent, peripheral artery disease, family history of premature coronary artery disease, mixed hyperlipidemia, active smoking, active marijuana use, history of substance abuse, hypertension, morbid obesity due to excess calories, history of TIA.  Established coronary artery disease with prior angioplasty and stenting without angina pectoris:  Patient denies any active chest pain or anginal equivalent.  Patient's overall functional status remains same without any significant change.  Patient is currently on dual antiplatelet therapy.  Continue statin therapy.  Continue beta-blocker therapy.  Patient will be started on losartan 50 mg p.o. every afternoon.  Echocardiogram will be ordered to evaluate for structural heart disease and left ventricular systolic function.  EKG performed and noted above.  Benign essential hypertension:  Patient's blood pressure is not at goal.  We will restart losartan at 50 mg p.o. q. p.m. He was originally on 100 mg p.o. daily in the past. However I do not have most recent blood work to evaluate his kidney function and potassium levels and therefore reinitiating at 50 mg p.o. daily  Continue Toprol, amlodipine.  Patient will be enrolled into principle care management for monitoring blood pressure. Marland Kitchen He is asked to keep a log of both blood pressure and pulse so that medications can be titrated based on a trend as opposed to isolated blood pressure readings in the office. . Low salt diet recommended. A diet that is rich in fruits, vegetables, legumes, and low-fat dairy products and low in snacks, sweets, and  meats (such as the Dietary Approaches to Stop Hypertension [DASH] diet).   Hyperlipidemia, mixed:  Continue atorvastatin milligrams p.o. nightly.  Check fasting lipid profile, direct LDL, apolipoprotein B, LP(a)   Patient did not endorse any evidence of myalgias.  Peripheral arterial disease without symptomatic claudication:  Currently on aspirin and Brilinta for CAD.  Educated extensively on the importance of complete cessation of marijuana use as well as cigarettes.  Educated in the importance of physical activity 30 minutes a day 5 days a week as tolerated.  Contact statin therapy.  Dizziness: Check carotid duplex to evaluate for carotid artery atherosclerosis.  Orders Placed This Encounter  Procedures  . Lipid Panel With LDL/HDL Ratio  . CMP14+EGFR  . HgB A1c  . LDL cholesterol, direct  . Apolipoprotein B  . Lipoprotein A (LPA)  . Basic metabolic panel  . EKG 12-Lead  . PCV ECHOCARDIOGRAM COMPLETE  . PCV CAROTID DUPLEX (BILATERAL)   --Continue cardiac medications as reconciled in final medication list. --Return in about 8 weeks (around 09/04/2019). Or sooner if needed. --Continue follow-up with your primary care physician regarding the management of your other chronic comorbid conditions.  Total encounter time 60 minutes. *Total Encounter Time as defined by the Centers for Medicare and Medicaid Services includes, in addition to the face-to-face time of a patient visit (documented in the note above) non-face-to-face time: obtaining and reviewing outside history, ordering and reviewing medications, tests or procedures, care coordination (communications with other health care professionals or caregivers) and documentation in the medical record.  Patient's questions and concerns were addressed to his satisfaction. He voices understanding of the instructions provided during this encounter.   During this visit I reviewed and updated: Tobacco history  allergies medication  reconciliation  medical history  surgical history  family history  social history.  This note was created using a voice recognition software as a result there may be grammatical errors inadvertently enclosed that do not reflect the nature of this encounter. Every attempt is made to correct such errors.  Rex Kras, [place], [facility]  Pager: [phone] Office: [phone]

## 2019-07-12 LAB — BASIC METABOLIC PANEL
BUN/Creatinine Ratio: 14 (ref 10–24)
BUN: 15 mg/dL (ref 8–27)
CO2: 18 mmol/L — ABNORMAL LOW (ref 20–29)
Calcium: 8.6 mg/dL (ref 8.6–10.2)
Chloride: 108 mmol/L — ABNORMAL HIGH (ref 96–106)
Creatinine, Ser: 1.04 mg/dL (ref 0.76–1.27)
GFR calc Af Amer: 87 mL/min/{1.73_m2} (ref >59)
GFR calc non Af Amer: 76 mL/min/{1.73_m2} (ref >59)
Glucose: 118 mg/dL — ABNORMAL HIGH (ref 65–99)
Potassium: 3.8 mmol/L (ref 3.5–5.2)
Sodium: 141 mmol/L (ref 134–144)

## 2019-07-12 LAB — LIPID PANEL WITH LDL/HDL RATIO
Cholesterol, Total: 91 mg/dL — ABNORMAL LOW (ref 100–199)
HDL: 36 mg/dL — ABNORMAL LOW (ref >39)
LDL Chol Calc (NIH): 25 mg/dL (ref 0–99)
LDL/HDL Ratio: 0.7 ratio (ref 0.0–3.6)
Triglycerides: 187 mg/dL — ABNORMAL HIGH (ref 0–149)
VLDL Cholesterol Cal: 30 mg/dL (ref 5–40)

## 2019-07-13 LAB — CMP14+EGFR
ALT: 28 IU/L (ref 0–44)
AST: 28 IU/L (ref 0–40)
Albumin/Globulin Ratio: 1.6 (ref 1.2–2.2)
Albumin: 4.1 g/dL (ref 3.8–4.8)
Alkaline Phosphatase: 144 IU/L — ABNORMAL HIGH (ref 39–117)
BUN/Creatinine Ratio: 13 (ref 10–24)
BUN: 14 mg/dL (ref 8–27)
Bilirubin Total: 0.3 mg/dL (ref 0.0–1.2)
CO2: 20 mmol/L (ref 20–29)
Calcium: 9 mg/dL (ref 8.6–10.2)
Chloride: 108 mmol/L — ABNORMAL HIGH (ref 96–106)
Creatinine, Ser: 1.12 mg/dL (ref 0.76–1.27)
GFR calc Af Amer: 80 mL/min/{1.73_m2} (ref >59)
GFR calc non Af Amer: 69 mL/min/{1.73_m2} (ref >59)
Globulin, Total: 2.6 g/dL (ref 1.5–4.5)
Glucose: 116 mg/dL — ABNORMAL HIGH (ref 65–99)
Potassium: 3.8 mmol/L (ref 3.5–5.2)
Sodium: 141 mmol/L (ref 134–144)
Total Protein: 6.7 g/dL (ref 6.0–8.5)

## 2019-07-13 LAB — APOLIPOPROTEIN B: Apolipoprotein B: 44 mg/dL (ref <90)

## 2019-07-13 LAB — HEMOGLOBIN A1C
Est. average glucose Bld gHb Est-mCnc: 131 mg/dL
Hgb A1c MFr Bld: 6.2 % — ABNORMAL HIGH (ref 4.8–5.6)

## 2019-07-13 LAB — LDL CHOLESTEROL, DIRECT: LDL Direct: 27 mg/dL (ref 0–99)

## 2019-07-13 LAB — LIPOPROTEIN A (LPA): Lipoprotein (a): 15.1 nmol/L (ref <75.0)

## 2019-07-16 ENCOUNTER — Telehealth: Payer: Self-pay

## 2019-07-16 ENCOUNTER — Other Ambulatory Visit: Payer: Self-pay | Admitting: Cardiology

## 2019-07-16 DIAGNOSIS — I2510 Atherosclerotic heart disease of native coronary artery without angina pectoris: Secondary | ICD-10-CM

## 2019-07-16 DIAGNOSIS — Z955 Presence of coronary angioplasty implant and graft: Secondary | ICD-10-CM

## 2019-07-16 DIAGNOSIS — I252 Old myocardial infarction: Secondary | ICD-10-CM

## 2019-07-16 MED ORDER — VASCEPA 1 G PO CAPS: 2.0000 g | ORAL_CAPSULE | Freq: Two times a day (BID) | ORAL | 0 refills | Status: DC

## 2019-07-16 NOTE — Telephone Encounter (Signed)
-----   Message from [place], [place] sent at 07/14/2019 11:14 PM EDT ----- Results reviewed.Serum potassium within normal limits.Kidney function is relatively at baseline. LDL, lpa, and ApoB is currently at goal.Patient's triglyceride levels are not at goal, with his history of heart disease with Vascepa to help improve triglycerides.  If patient is agreeable please let me know so we can send a prescription to his pharmacy. His A1c is consistent with prediabetes, please have him discuss this further with primary care provider. His alkaline phosphatase is also higher than normal limit please have him discuss this finding with his primary care provider.Continue current medical therapy.Call if questions arise.

## 2019-07-16 NOTE — Progress Notes (Signed)
Called and spoke with patient regarding his lab results. Patient is agreeable to start [place].

## 2019-07-16 NOTE — Progress Notes (Signed)
Prescription sent to his pharmacy.  I also sent him blood work in 6 weeks to recheck lipid panel, fasting.  If I do not have a follow-up with him please make a follow-up in 7 weeks to evaluate therapy for hypertriglyceridemia, after he gets blood work done.

## 2019-07-16 NOTE — Telephone Encounter (Signed)
Left vm to cb.

## 2019-07-17 NOTE — Progress Notes (Signed)
Called patient, na, no VMbox to leave message.

## 2019-07-18 ENCOUNTER — Other Ambulatory Visit: Payer: Self-pay

## 2019-07-18 ENCOUNTER — Ambulatory Visit: Payer: Medicaid Other

## 2019-07-18 DIAGNOSIS — R42 Dizziness and giddiness: Secondary | ICD-10-CM

## 2019-07-18 DIAGNOSIS — I6523 Occlusion and stenosis of bilateral carotid arteries: Secondary | ICD-10-CM

## 2019-07-18 DIAGNOSIS — I2510 Atherosclerotic heart disease of native coronary artery without angina pectoris: Secondary | ICD-10-CM

## 2019-07-18 LAB — LIPID PANEL WITH LDL/HDL RATIO
Cholesterol, Total: 81 mg/dL — ABNORMAL LOW (ref 100–199)
HDL: 41 mg/dL (ref >39)
LDL Chol Calc (NIH): 19 mg/dL (ref 0–99)
LDL/HDL Ratio: 0.5 ratio (ref 0.0–3.6)
Triglycerides: 114 mg/dL (ref 0–149)
VLDL Cholesterol Cal: 21 mg/dL (ref 5–40)

## 2019-07-18 NOTE — Progress Notes (Signed)
Called patient, unable to leave vm.

## 2019-07-22 ENCOUNTER — Other Ambulatory Visit: Payer: Self-pay | Admitting: Cardiology

## 2019-07-22 DIAGNOSIS — I6523 Occlusion and stenosis of bilateral carotid arteries: Secondary | ICD-10-CM

## 2019-07-24 ENCOUNTER — Telehealth: Payer: Self-pay

## 2019-07-24 NOTE — Telephone Encounter (Signed)
-----   Message from [place], [place] sent at 07/22/2019 11:07 PM EDT ----- Triglycerides improved compared to prior levels. Continue current management.

## 2019-07-24 NOTE — Telephone Encounter (Signed)
Left vm to cb x2.

## 2019-07-27 ENCOUNTER — Other Ambulatory Visit: Payer: Self-pay

## 2019-07-27 DIAGNOSIS — I2510 Atherosclerotic heart disease of native coronary artery without angina pectoris: Secondary | ICD-10-CM

## 2019-07-27 DIAGNOSIS — I252 Old myocardial infarction: Secondary | ICD-10-CM

## 2019-07-27 DIAGNOSIS — Z955 Presence of coronary angioplasty implant and graft: Secondary | ICD-10-CM

## 2019-07-27 MED ORDER — VASCEPA 1 G PO CAPS: 2.0000 g | ORAL_CAPSULE | Freq: Two times a day (BID) | ORAL | 0 refills | Status: DC

## 2019-07-31 ENCOUNTER — Telehealth: Payer: Self-pay

## 2019-07-31 NOTE — Telephone Encounter (Signed)
Relayed information to patient about lab results. Patient voiced understanding.

## 2019-09-03 ENCOUNTER — Telehealth: Payer: Self-pay | Admitting: Pharmacist

## 2019-09-03 DIAGNOSIS — I252 Old myocardial infarction: Secondary | ICD-10-CM

## 2019-09-03 NOTE — Telephone Encounter (Signed)
Called pt for RPM/PCM follow up. Reviewed home BP readings together. BP readings labile and not at goal. BP reading ranging from 128-175/73-104. HR ranging from 59-70. Reviewed readings with pt. Med list reviewed and updated. Pt reports to be doing well and denies any episodes of HA, CP, lightheadedness, dizziness. Recent switch from losartan to losartan-HCTZ from PCP. Has a PCP f/u scheduled for tomorrow. Pt reports that recent family tragedies is affecting pt's mood and overall well being. Hasnt been able to be motivated to exercise like he should. Pt planning on requesting referral to a therapist or a counselor at next [place]. No change recommended at this time. Will continue to monitor and follow up.

## 2019-09-04 ENCOUNTER — Other Ambulatory Visit: Payer: Self-pay | Admitting: Cardiology

## 2019-09-04 DIAGNOSIS — I2510 Atherosclerotic heart disease of native coronary artery without angina pectoris: Secondary | ICD-10-CM

## 2019-09-04 DIAGNOSIS — I252 Old myocardial infarction: Secondary | ICD-10-CM

## 2019-09-04 DIAGNOSIS — Z955 Presence of coronary angioplasty implant and graft: Secondary | ICD-10-CM

## 2019-09-05 ENCOUNTER — Ambulatory Visit: Payer: Medicaid Other | Admitting: Cardiology

## 2019-09-29 ENCOUNTER — Other Ambulatory Visit: Payer: Self-pay | Admitting: Cardiology

## 2019-10-01 ENCOUNTER — Other Ambulatory Visit: Payer: Self-pay | Admitting: Cardiology

## 2019-10-19 ENCOUNTER — Other Ambulatory Visit: Payer: Self-pay | Admitting: Cardiology

## 2019-10-23 MED ORDER — NITROGLYCERIN 0.4 MG SL SUBL: 0.4000 mg | SUBLINGUAL_TABLET | SUBLINGUAL | 0 refills | Status: AC | PRN

## 2019-10-23 NOTE — Addendum Note (Signed)
Addended by: Kela Millin on: 10/23/2019 10:47 PM   Modules accepted: Orders

## 2019-10-23 NOTE — Addendum Note (Signed)
Addended by: Manuela Schwartz T on: 10/23/2019 03:23 PM   Modules accepted: Orders

## 2019-11-06 ENCOUNTER — Other Ambulatory Visit: Payer: Self-pay | Admitting: Cardiology

## 2019-11-06 DIAGNOSIS — I10 Essential (primary) hypertension: Secondary | ICD-10-CM

## 2019-12-28 ENCOUNTER — Other Ambulatory Visit: Payer: Self-pay

## 2019-12-28 ENCOUNTER — Ambulatory Visit: Payer: Medicaid Other

## 2019-12-28 DIAGNOSIS — I6523 Occlusion and stenosis of bilateral carotid arteries: Secondary | ICD-10-CM

## 2019-12-30 ENCOUNTER — Other Ambulatory Visit: Payer: Self-pay | Admitting: Cardiology

## 2019-12-31 NOTE — Progress Notes (Signed)
Discuss on OV soon. May need CTA or referral to vascular eval or angio to evaluate significant stenosis

## 2020-01-09 ENCOUNTER — Ambulatory Visit: Payer: Medicaid Other | Admitting: Cardiology

## 2020-01-09 ENCOUNTER — Encounter: Payer: Self-pay | Admitting: Cardiology

## 2020-01-09 ENCOUNTER — Other Ambulatory Visit: Payer: Self-pay

## 2020-01-09 VITALS — BP 130/72 | HR 47 | Resp 16 | Ht 66.0 in | Wt 185.0 lb

## 2020-01-09 DIAGNOSIS — I739 Peripheral vascular disease, unspecified: Secondary | ICD-10-CM

## 2020-01-09 DIAGNOSIS — I252 Old myocardial infarction: Secondary | ICD-10-CM

## 2020-01-09 DIAGNOSIS — E782 Mixed hyperlipidemia: Secondary | ICD-10-CM

## 2020-01-09 DIAGNOSIS — I2510 Atherosclerotic heart disease of native coronary artery without angina pectoris: Secondary | ICD-10-CM

## 2020-01-09 DIAGNOSIS — F17200 Nicotine dependence, unspecified, uncomplicated: Secondary | ICD-10-CM

## 2020-01-09 DIAGNOSIS — I6523 Occlusion and stenosis of bilateral carotid arteries: Secondary | ICD-10-CM

## 2020-01-09 NOTE — Progress Notes (Signed)
Primary Physician/Referring:  Ricke Hey, MD  Patient ID: [name], male    DOB: 1955-08-04, 64 y.o.   MRN: [id number]  Chief Complaint  Patient presents with  . Hx of non-ST elevation myocardial infarction (NSTEMI  . Follow-up    8 weeks   HPI:    [name]  is a 64 y.o. 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  States that he is presently doing well and denies chest pain or symptoms of claudication.  Except for mild arthritis he has no other specific symptoms today.  Past Medical History:  Diagnosis Date  . Anxiety   . Arthritis   . Coronary artery disease   . Dementia ([place])   . Depression   . Diabetes mellitus   . Gout   . Hyperlipidemia   . Hypertension   . PAD (peripheral artery disease) ([place])   . TIA (transient ischemic attack)    Past Surgical History:  Procedure Laterality Date  . CARDIAC CATHETERIZATION N/A 12/18/2015   Procedure: Left Heart Cath and Coronary Angiography;  Surgeon: Adrian Prows, MD;  Location: [facility];  Service: Cardiovascular;  Laterality: N/A;  . CARDIAC CATHETERIZATION N/A 12/18/2015   Procedure: Coronary Stent Intervention;  Surgeon: Adrian Prows, MD;  Location: [facility];  Service: Cardiovascular;  Laterality: N/A;  . CARDIAC CATHETERIZATION N/A 12/18/2015   Procedure: Coronary Balloon Angioplasty;  Surgeon: Adrian Prows, MD;   Location: [facility];  Service: Cardiovascular;  Laterality: N/A;  . COLONOSCOPY N/A 01/12/2016   Procedure: COLONOSCOPY;  Surgeon: Manus Gunning, MD;  Location: [facility];  Service: Gastroenterology;  Laterality: N/A;  . HERNIA REPAIR    . PERIPHERAL VASCULAR CATHETERIZATION N/A 12/18/2015   Procedure: Abdominal Aortogram;  Surgeon: Adrian Prows, MD;  Location: [facility];  Service: Cardiovascular;  Laterality: N/A;   Family History  Problem Relation Age of Onset  . Heart attack Mother   . Heart attack Father   . IgA nephropathy Son     Social History   Tobacco Use  . Smoking status: Current Some Day Smoker    Packs/day: 0.50    Types: Cigarettes  . Smokeless tobacco: Never Used  Substance Use Topics  . Alcohol use: Not Currently    Comment: occasionally   Marital Status: Legally Separated  ROS  Review of Systems  Cardiovascular: Negative for chest pain, dyspnea on exertion and leg swelling.  Gastrointestinal: Negative for melena.   Objective  Blood pressure 130/72, pulse (!) 47, resp. rate 16, height 5\' 6"  (1.676 m), weight 185 lb (83.9 kg), SpO2 98 %.  Vitals with BMI 01/09/2020 07/10/2019 04/22/2016  Height 5\' 6"  5\' 6"  -  Weight 185 lbs 202 lbs -  BMI 08.65 78.46 -  Systolic 962 952 95  Diastolic 72 85 61  Pulse 47 64 61     Physical Exam Cardiovascular:     Rate and Rhythm:  Normal rate and regular rhythm.     Pulses: Intact distal pulses.          Carotid pulses are on the right side with bruit and on the left side with bruit.      Dorsalis pedis pulses are 1+ on the right side and 1+ on the left side.       Posterior tibial pulses are 1+ on the right side and 1+ on the left side.     Heart sounds: Normal heart sounds. No murmur heard.  No gallop.      Comments: No leg edema, no JVD. Pulmonary:     Effort: Pulmonary effort is normal.     Breath sounds: Normal breath sounds.  Abdominal:     General: Bowel sounds are normal.      Palpations: Abdomen is soft.    Laboratory examination:   Recent Labs    07/11/19 0811 07/11/19 0812  NA 141 141  K 3.8 3.8  CL 108* 108*  CO2 20 18*  GLUCOSE 116* 118*  BUN 14 15  CREATININE 1.12 1.04  CALCIUM 9.0 8.6  GFRNONAA 69 76  GFRAA 80 87   CrCl cannot be calculated (Patient's most recent lab result is older than the maximum 21 days allowed.).  CMP Latest Ref Rng & Units 07/11/2019 07/11/2019 03/31/2016  Glucose 65 - 99 mg/dL 118(H) 116(H) 109(H)  BUN 8 - 27 mg/dL 15 14 18   Creatinine 0.76 - 1.27 mg/dL 1.04 1.12 1.32(H)  Sodium 134 - 144 mmol/L 141 141 137  Potassium 3.5 - 5.2 mmol/L 3.8 3.8 3.6  Chloride 96 - 106 mmol/L 108(H) 108(H) 104  CO2 20 - 29 mmol/L 18(L) 20 23  Calcium 8.6 - 10.2 mg/dL 8.6 9.0 8.6(L)  Total Protein 6.0 - 8.5 g/dL - 6.7 7.2  Total Bilirubin 0.0 - 1.2 mg/dL - 0.3 0.5  Alkaline Phos 39 - 117 IU/L - 144(H) 112  AST 0 - 40 IU/L - 28 27  ALT 0 - 44 IU/L - 28 21   CBC Latest Ref Rng & Units 03/31/2016 01/12/2016 01/11/2016  WBC 4.0 - 10.5 K/uL 8.7 7.4 7.2  Hemoglobin 13.0 - 17.0 g/dL 13.4 10.0(L) 9.8(L)  Hematocrit 39 - 52 % 39.9 30.9(L) 29.6(L)  Platelets 150 - 400 K/uL 168 126(L) 113(L)   Lipid Panel Recent Labs    07/11/19 0811 07/11/19 0813 07/17/19 1304  CHOL  --  91* 81*  TRIG  --  187* 114  LDLCALC  --  25 19  HDL  --  36* 41  LDLDIRECT 27  --   --     HEMOGLOBIN A1C Lab Results  Component Value Date   HGBA1C 6.2 (H) 07/11/2019   MPG 120 05/02/2008   TSH No results for input(s): TSH in the last 8760 hours.  Lipoprotein (a) <75.0 nmol/L 15.1, apolipoprotein B normal at 44.   Date 07/11/2019  Medications and allergies   Allergies  Allergen Reactions  . Vicodin [Hydrocodone-Acetaminophen] Nausea And Vomiting    Outpatient Medications Prior to Visit  Medication Sig Dispense Refill  . allopurinol (ZYLOPRIM) 100 MG tablet Take 100 mg by mouth daily.    Marland Kitchen amLODipine (NORVASC) 10 MG tablet Take 10 mg by mouth daily.  Taking it in the morning    . aspirin 81 MG chewable tablet Chew 1 tablet (81 mg total) by mouth daily. 30 tablet 1  . atorvastatin (LIPITOR) 80 MG tablet TAKE 1 TABLET BY MOUTH DAILY (Patient taking differently: Taking  in the evening with food) 90 tablet 3  . BRILINTA 60 MG TABS tablet TAKE 1 TABLET BY MOUTH TWICE DAILY 180 tablet 3  . Colchicine 0.6 MG CAPS take 1 capsule by mouth once daily  0  . escitalopram (LEXAPRO) 20 MG tablet Take 20 mg by mouth daily.    Marland Kitchen esomeprazole (NEXIUM) 20 MG capsule Take 20 mg by mouth daily at 12 noon.    . hydrOXYzine (ATARAX/VISTARIL) 25 MG tablet Take 25 mg by mouth daily as needed (sleep).    . losartan-hydrochlorothiazide (HYZAAR) 100-12.5 MG tablet Take 1 tablet by mouth daily. Taking in the morning    . metoprolol tartrate (LOPRESSOR) 100 MG tablet Take 0.5 tablets (50 mg total) by mouth daily. (Patient taking differently: Take 50 mg by mouth daily. Taking in the morning) 30 tablet 1  . nitroGLYCERIN (NITROSTAT) 0.4 MG SL tablet Place 1 tablet (0.4 mg total) under the tongue every 5 (five) minutes as needed. For chest pain 30 tablet 0  . VASCEPA 1 g capsule TAKE 2 CAPSULES(2 GRAMS) BY MOUTH TWICE DAILY 120 capsule 3  . traZODone (DESYREL) 100 MG tablet Take 100 mg by mouth at bedtime.     No facility-administered medications prior to visit.    Radiology:   No results found.  Cardiac Studies:   Echocardiogram 07/18/2019: Normal LV systolic function with visual EF 50-55%. Left ventricle cavity is normal in size. Moderate left ventricular hypertrophy. Normal global wall motion. Doppler evidence of grade II  diastolic dysfunction, elevated LAP.  Left atrial cavity is mildly dilated. Mild (Grade I) mitral regurgitation. Mild tricuspid regurgitation. Mild pulmonic regurgitation. Prior study dated 02/25/2016 noted LVEF 65%, mild to moderate MR, mild TR.   Carotid artery duplex  12/28/2019: Stenosis in the right internal carotid artery (80-99%).  Stenosis in the right external carotid artery (>50%). Stenosis in the left internal carotid artery (80-99%). Stenosis in the left external carotid artery (<50%). The PSV internal/common carotid artery ratio is consistent with a stenosis of >70% bilaterally, right  7.89 and left 8.67.  Antegrade right vertebral artery flow. Antegrade left vertebral artery flow. Follow up in four months is appropriate if clinically indicated. Peak velocity on the right was 349/117 cm/s and 334/106 cm/s on the left. Compared to the study done on 07/18/2019, there is progression of disease in bilateral internal carotid artery.  Previously right ICA stenosis was 50 to 69% and left was >70%. See attached images.   EKG:   EKG 01/09/2020: Marked sinus bradycardia at rate of 46 bpm, m normal axis, poor R wave progression, probably normal variant.  No evidence of ischemia.    Assessment     ICD-10-CM   1. Atherosclerosis of native coronary artery of native heart without angina pectoris  I25.10 EKG 12-Lead  2. Bilateral carotid artery stenosis  I65.23 Ambulatory referral to Vascular Surgery  3. Mixed hyperlipidemia  E78.2   4. Peripheral artery disease (HCC)  I73.9   5. Tobacco use disorder  F17.200     There are no discontinued medications.  No orders of the defined types were placed in this encounter.  Recommendations:   [name] is a 64 y.o.  Caucasian male whose past medical history and cardiovascular risk factors include: History of non-STEMI, established coronary artery disease with prior angioplasty and stent, peripheral artery disease with interventions to his lower extremity in the remote past in [place], family history of premature coronary artery disease in which his father died at the age  of 95 and his mother at age 91 from [place] with CABG in her 51s, mixed hyperlipidemia, active smoking, active marijuana use, history of substance abuse remotely, hypertension, history of TIA and excessive alcohol use quit in  July 2021 presents for f/u of carotid disease, CAD and hypertension.  He is essentially asymptomatic with regard to coronary disease and peripheral artery disease.  He has now completely quit drinking alcohol since July 2021, unfortunately still smoking, smoking cessation discussed and he appears to be motivated.  He will try his best.  I reviewed the results of his recently performed carotid artery duplex revealing bilateral high-grade stenosis.  I will refer him to be evaluated by Dr. Ruta Hinds.  Advised on appropriate medical therapy, lipids under excellent control, I would like to see him back in 3 months for follow-up.  He has marked sinus bradycardia but asymptomatic.  Continue present medications.   Adrian Prows, MD, [facility] 01/11/2020, 6:03 PM Office: [phone]

## 2020-01-11 ENCOUNTER — Ambulatory Visit: Payer: Medicaid Other | Admitting: Cardiology

## 2020-01-14 ENCOUNTER — Other Ambulatory Visit: Payer: Self-pay | Admitting: Cardiology

## 2020-01-14 DIAGNOSIS — I10 Essential (primary) hypertension: Secondary | ICD-10-CM

## 2020-01-21 ENCOUNTER — Other Ambulatory Visit: Payer: Self-pay

## 2020-01-21 ENCOUNTER — Ambulatory Visit (INDEPENDENT_AMBULATORY_CARE_PROVIDER_SITE_OTHER): Payer: Medicaid Other | Admitting: Vascular Surgery

## 2020-01-21 ENCOUNTER — Encounter: Payer: Self-pay | Admitting: Vascular Surgery

## 2020-01-21 VITALS — BP 123/76 | HR 46 | Temp 97.3°F | Resp 18 | Ht 66.0 in | Wt 183.0 lb

## 2020-01-21 DIAGNOSIS — I6523 Occlusion and stenosis of bilateral carotid arteries: Secondary | ICD-10-CM

## 2020-01-21 NOTE — Progress Notes (Signed)
[facility] [place]  Patient name: [name] MRN: [id number] DOB: [birth year] Sex: male  REASON FOR CONSULT: Evaluation severe bilateral carotid disease  HPI: [name] is a 64 y.o. male, here today for evaluation.  He was found to have severe bilateral carotid disease at [facility] cardiovascular duplex.  Is here for further discussion.  He reports having episodes in the past with neurologic changes but specifically denies any focal neurologic deficits.  He does have history of coronary artery disease with history of myocardial infarction in the past  Past Medical History:  Diagnosis Date  . Anxiety   . Arthritis   . Coronary artery disease   . Dementia ([place])   . Depression   . Diabetes mellitus   . Gout   . Hyperlipidemia   . Hypertension   . PAD (peripheral artery disease) ([place])   . TIA (transient ischemic attack)     Family History  Problem Relation Age of Onset  . Heart attack Mother   . Heart attack Father   . IgA nephropathy Son     SOCIAL HISTORY: Social History   Socioeconomic History  . Marital status: Legally Separated    Spouse name: Not on file  . Number of children: 2  . Years of education: Not on file  . Highest education level: Not on file  Occupational History  . Not on file  Tobacco Use  . Smoking status: Current Some Day Smoker    Packs/day: 0.50    Types: Cigarettes  . Smokeless tobacco: Never Used  Vaping Use  . Vaping Use: Never used  Substance and Sexual Activity  . Alcohol use: Not Currently    Comment: occasionally  . Drug use: Yes    Types: Marijuana  . Sexual activity: Not on file  Other Topics Concern  . Not on file  Social History Narrative  . Not on file   Social Determinants of Health   Financial Resource Strain:   . Difficulty of Paying Living Expenses: Not on file  Food Insecurity:   . Worried About Charity fundraiser in the Last Year: Not on file  . Ran Out of Food in the Last Year:  Not on file  Transportation Needs:   . Lack of Transportation (Medical): Not on file  . Lack of Transportation (Non-Medical): Not on file  Physical Activity:   . Days of Exercise per Week: Not on file  . Minutes of Exercise per Session: Not on file  Stress:   . Feeling of Stress : Not on file  Social Connections:   . Frequency of Communication with Friends and Family: Not on file  . Frequency of Social Gatherings with Friends and Family: Not on file  . Attends Religious Services: Not on file  . Active Member of Clubs or Organizations: Not on file  . Attends Archivist Meetings: Not on file  . Marital Status: Not on file  Intimate Partner Violence:   . Fear of Current or Ex-Partner: Not on file  . Emotionally Abused: Not on file  . Physically Abused: Not on file  . Sexually Abused: Not on file    Allergies  Allergen Reactions  . Vicodin [Hydrocodone-Acetaminophen] Nausea And Vomiting    Current Outpatient Medications  Medication Sig Dispense Refill  . allopurinol (ZYLOPRIM) 100 MG tablet Take 100 mg by mouth daily.    Marland Kitchen amLODipine (NORVASC) 10 MG tablet Take 10 mg by mouth daily. Taking  it in the morning    . aspirin 81 MG chewable tablet Chew 1 tablet (81 mg total) by mouth daily. 30 tablet 1  . atorvastatin (LIPITOR) 80 MG tablet TAKE 1 TABLET BY MOUTH DAILY (Patient taking differently: Taking in the evening with food) 90 tablet 3  . BRILINTA 60 MG TABS tablet TAKE 1 TABLET BY MOUTH TWICE DAILY 180 tablet 3  . Colchicine 0.6 MG CAPS take 1 capsule by mouth once daily  0  . escitalopram (LEXAPRO) 20 MG tablet Take 20 mg by mouth daily.    Marland Kitchen esomeprazole (NEXIUM) 20 MG capsule Take 20 mg by mouth daily at 12 noon.    . hydrOXYzine (ATARAX/VISTARIL) 25 MG tablet Take 25 mg by mouth daily as needed (sleep).    . losartan-hydrochlorothiazide (HYZAAR) 100-12.5 MG tablet Take 1 tablet by mouth daily. Taking in the morning    . metoprolol tartrate (LOPRESSOR) 100 MG tablet  TAKE 1/2 TABLET(50 MG) BY MOUTH DAILY 30 tablet 1  . nitroGLYCERIN (NITROSTAT) 0.4 MG SL tablet Place 1 tablet (0.4 mg total) under the tongue every 5 (five) minutes as needed. For chest pain 30 tablet 0  . traZODone (DESYREL) 100 MG tablet Take 100 mg by mouth at bedtime.    Marland Kitchen VASCEPA 1 g capsule TAKE 2 CAPSULES(2 GRAMS) BY MOUTH TWICE DAILY 120 capsule 3   No current facility-administered medications for this visit.    REVIEW OF SYSTEMS:  [X]  denotes positive finding, [ ]  denotes negative finding Cardiac  Comments:  Chest pain or chest pressure:    Shortness of breath upon exertion:    Short of breath when lying flat:    Irregular heart rhythm:        Vascular    Pain in calf, thigh, or hip brought on by ambulation:    Pain in feet at night that wakes you up from your sleep:     Blood clot in your veins:    Leg swelling:         Pulmonary    Oxygen at home:    Productive cough:     Wheezing:         Neurologic    Sudden weakness in arms or legs:     Sudden numbness in arms or legs:     Sudden onset of difficulty speaking or slurred speech: x   Temporary loss of vision in one eye:     Problems with dizziness:  x       Gastrointestinal    Blood in stool:     Vomited blood:         Genitourinary    Burning when urinating:     Blood in urine:        Psychiatric    Major depression:         Hematologic    Bleeding problems:    Problems with blood clotting too easily:        Skin    Rashes or ulcers:        Constitutional    Fever or chills:      PHYSICAL EXAM: Vitals:   01/21/20 1423  BP: 123/76  Pulse: (!) 46  Resp: 18  Temp: (!) 97.3 F (36.3 C)  TempSrc: Other (Comment)  SpO2: 97%  Weight: 183 lb (83 kg)  Height: 5\' 6"  (1.676 m)    GENERAL: The patient is a well-nourished male, in no acute distress. The vital signs are documented above. CARDIAC: There is  a regular rate and rhythm.  VASCULAR: 2+ radial pulses bilaterally.  I do not hear carotid  bruits bilaterally PULMONARY: There is good air exchange bilaterally without wheezing or rales. ABDOMEN: Soft and non-tender with normal pitched bowel sounds.  MUSCULOSKELETAL: There are no major deformities or cyanosis. NEUROLOGIC: No focal weakness or paresthesias are detected. SKIN: There are no ulcers or rashes noted. PSYCHIATRIC: The patient has a normal affect.  DATA:   Duplex from Dr. Irven Shelling office was reviewed revealing greater than 80% ICA stenoses bilaterally  MEDICAL ISSUES:  Had long discussion with the patient regarding this.  Is not clear as to whether or not he has any focal deficits.  He is a very poor historian.  I have recommended CT angiogram for further evaluation of his probable bilateral critical disease.  I will give him a call following procedure for further recommendations.  I explained that if he does have critical disease that we would recommend endarterectomy.  If this is critical bilaterally would recommend staged bilateral carotid endarterectomies.  He is right-handed so we would recommend left endarterectomy first.  I will contact him following his CT angiogram   [name] [facility] [place] phone [phone]

## 2020-01-22 ENCOUNTER — Other Ambulatory Visit: Payer: Self-pay | Admitting: *Deleted

## 2020-01-22 ENCOUNTER — Other Ambulatory Visit: Payer: Self-pay | Admitting: Cardiology

## 2020-01-22 DIAGNOSIS — Z955 Presence of coronary angioplasty implant and graft: Secondary | ICD-10-CM

## 2020-01-22 DIAGNOSIS — I2510 Atherosclerotic heart disease of native coronary artery without angina pectoris: Secondary | ICD-10-CM

## 2020-01-22 DIAGNOSIS — I6523 Occlusion and stenosis of bilateral carotid arteries: Secondary | ICD-10-CM

## 2020-01-22 DIAGNOSIS — I252 Old myocardial infarction: Secondary | ICD-10-CM

## 2020-01-31 ENCOUNTER — Ambulatory Visit (HOSPITAL_COMMUNITY): Payer: Medicaid Other

## 2020-01-31 ENCOUNTER — Ambulatory Visit (HOSPITAL_COMMUNITY): Admission: RE | Admit: 2020-01-31 | Payer: Medicaid Other | Source: Ambulatory Visit

## 2020-01-31 ENCOUNTER — Other Ambulatory Visit (HOSPITAL_COMMUNITY): Payer: Medicaid Other

## 2020-02-11 ENCOUNTER — Other Ambulatory Visit: Payer: Self-pay

## 2020-02-11 ENCOUNTER — Ambulatory Visit (HOSPITAL_COMMUNITY)
Admission: RE | Admit: 2020-02-11 | Discharge: 2020-02-11 | Disposition: A | Discharge status: Home / Self Care | Payer: Medicaid Other | Source: Ambulatory Visit | Attending: Vascular Surgery | Admitting: Vascular Surgery

## 2020-02-11 DIAGNOSIS — I6523 Occlusion and stenosis of bilateral carotid arteries: Secondary | ICD-10-CM | POA: Diagnosis not present

## 2020-02-11 LAB — POCT I-STAT CREATININE: Creatinine, Ser: 1.2 mg/dL (ref 0.61–1.24)

## 2020-02-11 MED ORDER — IOHEXOL 350 MG/ML SOLN
75.0000 mL | Freq: Once | INTRAVENOUS | Status: AC | PRN
  Administered 2020-02-11: 75 mL via INTRAVENOUS

## 2020-02-14 ENCOUNTER — Telehealth: Payer: Self-pay | Admitting: Vascular Surgery

## 2020-02-14 NOTE — Telephone Encounter (Signed)
Patient underwent CT angiogram for further evaluation.  Duplex had shown severe bilateral carotid stenosis.  Duplex reveals high-grade carotid disease bilaterally.  The radiologist interpretation is 70% left and 90% right.  To my review he has at least 90% stenosis in the left side as well.  I have recommended staged bilateral carotid endarterectomies.  Discussed this with him by telephone today.  He is right-handed so would recommend left carotid endarterectomy first followed by staged right carotid endarterectomy.  I explained that our office would call to coordinate this with him.  I again reviewed the procedure for carotid endarterectomy

## 2020-02-15 ENCOUNTER — Other Ambulatory Visit: Payer: Self-pay

## 2020-02-26 ENCOUNTER — Other Ambulatory Visit (HOSPITAL_COMMUNITY): Payer: Medicaid Other

## 2020-02-26 ENCOUNTER — Ambulatory Visit (HOSPITAL_COMMUNITY): Payer: Medicaid Other

## 2020-02-26 NOTE — Progress Notes (Signed)
Your procedure is scheduled on Wednesday March 05, 2020 .  Report to [facility] Main Entrance "A" at 08:30 A.M., and check in at the Admitting office.  Call this number if you have problems the morning of surgery: [phone]  Call [phone] if you have any questions prior to your surgery date Monday-Friday 8am-4pm   Remember: Do not eat or drink after midnight the night before your surgery  Take these medicines the morning of surgery with A SIP OF WATER: allopurinol (ZYLOPRIM) amLODipine (NORVASC)  escitalopram (LEXAPRO)  metoprolol tartrate (LOPRESSOR)   Follow your surgeon's instructions on when to stop Aspirin and Brilinta.  If no instructions were given by your surgeon then you will need to call the office to get those instructions.    As of today, STOP taking any Aleve, Naproxen, Ibuprofen, Motrin, Advil, Goody's, BC's, all herbal medications, fish oil, and all vitamins.    The Morning of Surgery  Do not wear jewelry  Do not wear lotions, powders, colognes, or deodorant Men may shave face and neck.  Do not bring valuables to the hospital.  [facility] is not responsible for any belongings or valuables.  If you are a smoker, DO NOT Smoke 24 hours prior to surgery  If you wear a CPAP at night please bring your mask the morning of surgery   Remember that you must have someone to transport you home after your surgery, and remain with you for 24 hours if you are discharged the same day.   Please bring cases for contacts, glasses, hearing aids, dentures or bridgework because it cannot be worn into surgery.    Leave your suitcase in the car.  After surgery it may be brought to your room.  For patients admitted to the hospital, discharge time will be determined by your treatment team.  Patients discharged the day of surgery will not be allowed to drive home.    Special instructions:   [facility]- Preparing For Surgery  Before surgery, you can play an  important role. Because skin is not sterile, your skin needs to be as free of germs as possible. You can reduce the number of germs on your skin by washing with CHG (chlorahexidine gluconate) Soap before surgery.  CHG is an antiseptic cleaner which kills germs and bonds with the skin to continue killing germs even after washing.    Oral Hygiene is also important to reduce your risk of infection.  Remember - BRUSH YOUR TEETH THE MORNING OF SURGERY WITH YOUR REGULAR TOOTHPASTE  Please do not use if you have an allergy to CHG or antibacterial soaps. If your skin becomes reddened/irritated stop using the CHG.  Do not shave (including legs and underarms) for at least 48 hours prior to first CHG shower. It is OK to shave your face.  Please follow these instructions carefully.   1. Shower the NIGHT BEFORE SURGERY and the MORNING OF SURGERY with CHG Soap.   2. If you chose to wash your hair and body, wash as usual with your normal shampoo and body-wash/soap.  3. Rinse your hair and body thoroughly to remove the shampoo and soap.  4. Apply CHG directly to the skin (ONLY FROM THE NECK DOWN) and wash gently with a scrungie or a clean washcloth.   5. Do not use on open wounds or open sores. Avoid contact with your eyes, ears, mouth and genitals (private parts). Wash Face and genitals (private parts)  with your normal soap.   6.  Wash thoroughly, paying special attention to the area where your surgery will be performed.  7. Thoroughly rinse your body with warm water from the neck down.  8. DO NOT shower/wash with your normal soap after using and rinsing off the CHG Soap.  9. Pat yourself dry with a CLEAN TOWEL.  10. Wear CLEAN PAJAMAS to bed the night before surgery  11. Place CLEAN SHEETS on your bed the night of your first shower and DO NOT SLEEP WITH PETS.  12. Wear comfortable clothes the morning of surgery.     Day of Surgery:  Please shower the morning of surgery with the CHG soap Do  not apply any deodorants/lotions. Please wear clean clothes to the hospital/surgery center.   Remember to brush your teeth WITH YOUR REGULAR TOOTHPASTE.   Please read over the following fact sheets that you were given.

## 2020-02-27 ENCOUNTER — Encounter (HOSPITAL_COMMUNITY): Payer: Self-pay

## 2020-02-27 ENCOUNTER — Other Ambulatory Visit: Payer: Self-pay

## 2020-02-27 ENCOUNTER — Encounter (HOSPITAL_COMMUNITY)
Admission: RE | Admit: 2020-02-27 | Discharge: 2020-02-27 | Disposition: A | Discharge status: Home / Self Care | Payer: Medicare (Managed Care) | Source: Ambulatory Visit | Attending: Vascular Surgery | Admitting: Vascular Surgery

## 2020-02-27 DIAGNOSIS — E785 Hyperlipidemia, unspecified: Secondary | ICD-10-CM | POA: Diagnosis not present

## 2020-02-27 DIAGNOSIS — F17210 Nicotine dependence, cigarettes, uncomplicated: Secondary | ICD-10-CM | POA: Insufficient documentation

## 2020-02-27 DIAGNOSIS — I10 Essential (primary) hypertension: Secondary | ICD-10-CM | POA: Insufficient documentation

## 2020-02-27 DIAGNOSIS — Z8673 Personal history of transient ischemic attack (TIA), and cerebral infarction without residual deficits: Secondary | ICD-10-CM | POA: Insufficient documentation

## 2020-02-27 DIAGNOSIS — Z7901 Long term (current) use of anticoagulants: Secondary | ICD-10-CM | POA: Insufficient documentation

## 2020-02-27 DIAGNOSIS — Z79899 Other long term (current) drug therapy: Secondary | ICD-10-CM | POA: Insufficient documentation

## 2020-02-27 DIAGNOSIS — Z7982 Long term (current) use of aspirin: Secondary | ICD-10-CM | POA: Insufficient documentation

## 2020-02-27 DIAGNOSIS — I6523 Occlusion and stenosis of bilateral carotid arteries: Secondary | ICD-10-CM | POA: Diagnosis not present

## 2020-02-27 DIAGNOSIS — I2510 Atherosclerotic heart disease of native coronary artery without angina pectoris: Secondary | ICD-10-CM | POA: Insufficient documentation

## 2020-02-27 DIAGNOSIS — I252 Old myocardial infarction: Secondary | ICD-10-CM | POA: Diagnosis not present

## 2020-02-27 DIAGNOSIS — Z01812 Encounter for preprocedural laboratory examination: Secondary | ICD-10-CM | POA: Diagnosis present

## 2020-02-27 HISTORY — DX: Cardiac arrhythmia, unspecified: I49.9

## 2020-02-27 HISTORY — DX: Occlusion and stenosis of unspecified carotid artery: I65.29

## 2020-02-27 HISTORY — DX: Acute myocardial infarction, unspecified: I21.9

## 2020-02-27 LAB — COMPREHENSIVE METABOLIC PANEL
ALT: 51 U/L — ABNORMAL HIGH (ref 0–44)
AST: 35 U/L (ref 15–41)
Albumin: 3.9 g/dL (ref 3.5–5.0)
Alkaline Phosphatase: 101 U/L (ref 38–126)
Anion gap: 12 (ref 5–15)
BUN: 15 mg/dL (ref 8–23)
CO2: 24 mmol/L (ref 22–32)
Calcium: 9 mg/dL (ref 8.9–10.3)
Chloride: 104 mmol/L (ref 98–111)
Creatinine, Ser: 1.35 mg/dL — ABNORMAL HIGH (ref 0.61–1.24)
GFR, Estimated: 59 mL/min — ABNORMAL LOW (ref >60)
Glucose, Bld: 110 mg/dL — ABNORMAL HIGH (ref 70–99)
Potassium: 3.6 mmol/L (ref 3.5–5.1)
Sodium: 140 mmol/L (ref 135–145)
Total Bilirubin: 0.8 mg/dL (ref 0.3–1.2)
Total Protein: 6.8 g/dL (ref 6.5–8.1)

## 2020-02-27 LAB — TYPE AND SCREEN
ABO/RH(D): O NEG
Antibody Screen: NEGATIVE

## 2020-02-27 LAB — URINALYSIS, ROUTINE W REFLEX MICROSCOPIC
Bilirubin Urine: NEGATIVE
Glucose, UA: NEGATIVE mg/dL
Hgb urine dipstick: NEGATIVE
Ketones, ur: NEGATIVE mg/dL
Leukocytes,Ua: NEGATIVE
Nitrite: NEGATIVE
Protein, ur: NEGATIVE mg/dL
Specific Gravity, Urine: 1.012 (ref 1.005–1.030)
pH: 6 (ref 5.0–8.0)

## 2020-02-27 LAB — CBC
HCT: 40.1 % (ref 39.0–52.0)
Hemoglobin: 13.3 g/dL (ref 13.0–17.0)
MCH: 32.2 pg (ref 26.0–34.0)
MCHC: 33.2 g/dL (ref 30.0–36.0)
MCV: 97.1 fL (ref 80.0–100.0)
Platelets: 155 10*3/uL (ref 150–400)
RBC: 4.13 MIL/uL — ABNORMAL LOW (ref 4.22–5.81)
RDW: 14.4 % (ref 11.5–15.5)
WBC: 7.3 10*3/uL (ref 4.0–10.5)
nRBC: 0 % (ref 0.0–0.2)

## 2020-02-27 LAB — PROTIME-INR
INR: 1.1 (ref 0.8–1.2)
Prothrombin Time: 13.3 seconds (ref 11.4–15.2)

## 2020-02-27 LAB — SURGICAL PCR SCREEN
MRSA, PCR: NEGATIVE
Staphylococcus aureus: NEGATIVE

## 2020-02-27 LAB — APTT: aPTT: 31 seconds (ref 24–36)

## 2020-02-27 NOTE — Progress Notes (Addendum)
Your procedure is scheduled on Wednesday March 05, 2020 .  Report to [facility] Main Entrance "A" at 08:30 A.M., and check in at the Admitting office.  Call this number if you have problems the morning of surgery: [phone]  Call [phone] if you have any questions prior to your surgery date Monday-Friday 8am-4pm   Remember: Do not eat or drink after midnight the night before your surgery  Take these medicines the morning of surgery with A SIP OF WATER: allopurinol (ZYLOPRIM) amLODipine (NORVASC)  escitalopram (LEXAPRO)  metoprolol tartrate (LOPRESSOR)  VASCEPA  Continue taking aspirin. Stop taking Brilinta 02/28/2020.   As of today, STOP taking any Aleve, Naproxen, Ibuprofen, Motrin, Advil, Goody's, BC's, all herbal medications, fish oil, and all vitamins.    The Morning of Surgery  Do not wear jewelry  Do not wear lotions, powders, colognes, or deodorant Men may shave face and neck.  Do not bring valuables to the hospital.  [facility] is not responsible for any belongings or valuables.  If you are a smoker, DO NOT Smoke 24 hours prior to surgery  If you wear a CPAP at night please bring your mask the morning of surgery   Remember that you must have someone to transport you home after your surgery, and remain with you for 24 hours if you are discharged the same day.   Please bring cases for contacts, glasses, hearing aids, dentures or bridgework because it cannot be worn into surgery.    Leave your suitcase in the car.  After surgery it may be brought to your room.  For patients admitted to the hospital, discharge time will be determined by your treatment team.  Patients discharged the day of surgery will not be allowed to drive home.    Special instructions:   [place]- Preparing For Surgery  Before surgery, you can play an important role. Because skin is not sterile, your skin needs to be as free of germs as possible. You can reduce the number of  germs on your skin by washing with CHG (chlorahexidine gluconate) Soap before surgery.  CHG is an antiseptic cleaner which kills germs and bonds with the skin to continue killing germs even after washing.    Oral Hygiene is also important to reduce your risk of infection.  Remember - BRUSH YOUR TEETH THE MORNING OF SURGERY WITH YOUR REGULAR TOOTHPASTE  Please do not use if you have an allergy to CHG or antibacterial soaps. If your skin becomes reddened/irritated stop using the CHG.  Do not shave (including legs and underarms) for at least 48 hours prior to first CHG shower. It is OK to shave your face.  Please follow these instructions carefully.   1. Shower the NIGHT BEFORE SURGERY and the MORNING OF SURGERY with CHG Soap.   2. If you chose to wash your hair and body, wash as usual with your normal shampoo and body-wash/soap.  3. Rinse your hair and body thoroughly to remove the shampoo and soap.  4. Apply CHG directly to the skin (ONLY FROM THE NECK DOWN) and wash gently with a scrungie or a clean washcloth.   5. Do not use on open wounds or open sores. Avoid contact with your eyes, ears, mouth and genitals (private parts). Wash Face and genitals (private parts)  with your normal soap.   6. Wash thoroughly, paying special attention to the area where your surgery will be performed.  7. Thoroughly rinse your body with warm water from the  neck down.  8. DO NOT shower/wash with your normal soap after using and rinsing off the CHG Soap.  9. Pat yourself dry with a CLEAN TOWEL.  10. Wear CLEAN PAJAMAS to bed the night before surgery  11. Place CLEAN SHEETS on your bed the night of your first shower and DO NOT SLEEP WITH PETS.  12. Wear comfortable clothes the morning of surgery.     Day of Surgery:  Please shower the morning of surgery with the CHG soap Do not apply any deodorants/lotions. Please wear clean clothes to the hospital/surgery center.   Remember to brush your teeth  WITH YOUR REGULAR TOOTHPASTE.   Please read over the following fact sheets that you were given.

## 2020-02-27 NOTE — Progress Notes (Signed)
PCP - [name] Cardiologist - Dr. Einar Gip  PPM/ICD - denies    Chest x-ray - n/a EKG - 01/09/20 (sinus bradycardia HR 46) Stress Test - over 10 years ago, pt doesn't recall when just knows it was at [facility] ECHO - 07/18/19 Cardiac Cath - 12/18/15  Sleep Study - denies   Pt instructed to take aspirin the day of surgery. Pt also instructed to hold Brilinta started 02/28/2020.   ERAS Protcol -no   COVID TEST- 03/03/2020   Anesthesia review: yes, extensive cardiac history including cardiac cath in 2017. Pt was referred by Dr. Einar Gip to Dr. Oneida Alar on 12/30/2019.  Patient denies shortness of breath, fever, cough and chest pain at PAT appointment   All instructions explained to the patient, with a verbal understanding of the material. Patient agrees to go over the instructions while at home for a better understanding. Patient also instructed to self quarantine after being tested for COVID-19. The opportunity to ask questions was provided.

## 2020-02-28 ENCOUNTER — Encounter (HOSPITAL_COMMUNITY): Payer: Self-pay

## 2020-02-28 NOTE — Progress Notes (Signed)
Anesthesia Chart Review:  Case: [id number] Date/Time: 03/04/20 0715   Procedure: LEFT ENDARTERECTOMY CAROTID (Left )   Anesthesia type: Monitor Anesthesia Care   Pre-op diagnosis: BILATERAL CAROTID STENOSIS   Location: [facility] OR ROOM 12 / [place] OR   Surgeons: Rosetta Posner, MD      DISCUSSION: Patient is a 64 year old male scheduled for the above procedure.  He was referred to Dr. Donnetta Hutching by cardiologist Dr. Einar Gip after carotid ultrasound showed bilateral severe ICA stenosis.  CTA showed 90% on the right and 70% on the left.  He is for staged carotid endarterectomies starting with his left side.  History includes smoking, HTN, CAD (NSTEMI, s/p DES LAD x2 & PTCA PDA 12/18/15), PAD (remote intervention in [place]; right renal artery stent noted 12/18/15 aortogram), TIA (> 20 years ago), carotid artery disease, HLD, dementia (short term memory issues), polysubstance abuse (previous cocaine 2007; uses marijuana weekly, recently quit drink alcohol), AKI (12/2015 in setting of dehydration/diarrhea). He had an episode of afib in 12/2005 in the setting of recent cocaine use.   Reported instructions to hold Brilinta 02/28/20 and continue ASA.   Presurgical COVID-19 test is scheduled for 03/03/2020.  Anesthesia team to evaluate on the day of surgery.   VS: BP (!) 141/80   Pulse (!) 52   Temp 36.6 C (Oral)   Resp 19   Ht 5\' 6"  (1.676 m)   Wt 84.2 kg   SpO2 100%   BMI 29.96 kg/m    PROVIDERS: [name], MD is PCP Adrian Prows, MD is cardiologist last visit 01/09/2020.  Patient asymptomatic in regards to coronary and peripheral vascular disease.  He was still smoking but had recently quit alcohol.  He was markedly sinus bradycardic on EKG but asymptomatic.  No medication changes made.  He was referred to vascular surgery for bilateral carotid artery stenosis.   LABS: Labs reviewed: Acceptable for surgery. (all labs ordered are listed, but only abnormal results are displayed)  Labs Reviewed  CBC - Abnormal;  Notable for the following components:      Result Value   RBC 4.13 (*)    All other components within normal limits  COMPREHENSIVE METABOLIC PANEL - Abnormal; Notable for the following components:   Glucose, Bld 110 (*)    Creatinine, Ser 1.35 (*)    ALT 51 (*)    GFR, Estimated 59 (*)    All other components within normal limits  SURGICAL PCR SCREEN  APTT  PROTIME-INR  URINALYSIS, ROUTINE W REFLEX MICROSCOPIC  TYPE AND SCREEN     IMAGES: CTA head/neck 02/11/20: IMPRESSION: 1. Atherosclerotic changes at the bilateral carotid bifurcations. Focal area of bandlike stenosis in the right carotid bifurcation resulting in approximately 90% stenosis. 2. Atherosclerotic changes of the left carotid bifurcation resulting in approximately 70% stenosis. 3. No acute intracranial abnormality. Remote infarcts within the bilateral basal ganglia. Chronic microangiopathy. 4. Emphysema and aortic atherosclerosis. - Aortic Atherosclerosis (ICD10-I70.0) and Emphysema (ICD10-J43.9).   EKG: EKG 01/09/2020 [facility]): Marked sinus bradycardia at rate of 46 bpm, m normal axis, poor R wave progression, probably normal variant.  No evidence of ischemia. BORDERLINE RHYTHM   CV: Carotid artery duplex 12/28/2019:  Stenosis in the right internal carotid artery (80-99%). Stenosis in the  right external carotid artery (>50%).  Stenosis in the left internal carotid artery (80-99%). Stenosis in the  left external carotid artery (<50%).  The PSV internal/common carotid artery ratio is consistent with a stenosis  of >70% bilaterally, right  7.89 and left 8.67.  Antegrade right vertebral artery flow. Antegrade left vertebral artery  flow.  Follow up in four months is appropriate if clinically indicated. Peak  velocity on the right was 349/117 cm/s and 334/106 cm/s on the left.  Compared to the study done on 07/18/2019, there is progression of disease  in bilateral internal carotid artery.  Previously right ICA stenosis was  50 to 69% and left was >70%.   Echocardiogram 07/18/2019:  Normal LV systolic function with visual EF 50-55%. Left ventricle cavity  is normal in size. Moderate left ventricular hypertrophy. Normal global  wall motion. Doppler evidence of grade II diastolic dysfunction, elevated  LAP.  Left atrial cavity is mildly dilated.  Mild (Grade I) mitral regurgitation.  Mild tricuspid regurgitation.  Mild pulmonic regurgitation.  Prior study dated 02/25/2016 noted LVEF 65%, mild to moderate MR, mild TR.   LHC/Abdominal Aortogram 12/18/15: PCI: Successful PTCA and stenting of the proximal and mid LAD with 2 overlapping 4.0 x 22 and 3.5 x 12 mm resolute integrity DES, stenosis reduced from 99% to 0%. PTCA and balloon angioplasty of the distal PDA with a 2.5 x 15 mm Emerge with reduction of stenosis from 90% to less than 30%.  1. Severe diffuse coronary artery disease, scattered disease evident throughout the RCA, PDA has tandem 60% and a 90% stenosis. Distal lesion reduced to less than 30% with balloon angioplasty. 2. High-grade complex 99% stenosis of the proximal LAD. Successful angioplasty as noted above. 3. Mild disease in the circumflex with intermediate lesions 60-70% in OM 3 and OM 4 and distal circumflex AV groove branch. 4. Abdominal aortogram: No evidence of abdominal aortic aneurysm. There are 2 renal arteries on the left, one renal artery on the right. A renal artery stent is evident on the right, renal artery is visible and patent, in-stent restenosis is present could not be evaluated due to nonselective nature of abdominal aortogram. Consider other modalities to evaluate renal artery stenosis, selective renal arteriogram was not performed as patient was not consented and also procedure performed through radial access.  Recommendation: Patient needs aggressive risk factor modification, smoking cessation, needs dual antiplatelet therapy. A total of 290 mL  contrast utilized.   Past Medical History:  Diagnosis Date  . Anxiety   . Arthritis   . Coronary artery disease   . Dementia ([place])    problems with short term memory  . Depression   . Gout   . Hyperlipidemia   . Hypertension   . Myocardial infarction ([place])    2017  . PAD (peripheral artery disease) ([place])   . TIA (transient ischemic attack)    over 20 years ago    Past Surgical History:  Procedure Laterality Date  . CARDIAC CATHETERIZATION N/A 12/18/2015   Procedure: Left Heart Cath and Coronary Angiography;  Surgeon: Adrian Prows, MD;  Location: [place] CV LAB;  Service: Cardiovascular;  Laterality: N/A;  . CARDIAC CATHETERIZATION N/A 12/18/2015   Procedure: Coronary Stent Intervention;  Surgeon: Adrian Prows, MD;  Location: [place] CV LAB;  Service: Cardiovascular;  Laterality: N/A;  . CARDIAC CATHETERIZATION N/A 12/18/2015   Procedure: Coronary Balloon Angioplasty;  Surgeon: Adrian Prows, MD;  Location: [place] CV LAB;  Service: Cardiovascular;  Laterality: N/A;  . COLONOSCOPY N/A 01/12/2016   Procedure: COLONOSCOPY;  Surgeon: Manus Gunning, MD;  Location: [place];  Service: Gastroenterology;  Laterality: N/A;  . HERNIA REPAIR    . PERIPHERAL VASCULAR CATHETERIZATION N/A 12/18/2015   Procedure:  Abdominal Aortogram;  Surgeon: Adrian Prows, MD;  Location: [place] CV LAB;  Service: Cardiovascular;  Laterality: N/A;    MEDICATIONS: . allopurinol (ZYLOPRIM) 100 MG tablet  . amLODipine (NORVASC) 10 MG tablet  . aspirin 81 MG chewable tablet  . atorvastatin (LIPITOR) 80 MG tablet  . BRILINTA 60 MG TABS tablet  . escitalopram (LEXAPRO) 20 MG tablet  . hydrOXYzine (ATARAX/VISTARIL) 25 MG tablet  . losartan-hydrochlorothiazide (HYZAAR) 100-12.5 MG tablet  . metoprolol tartrate (LOPRESSOR) 100 MG tablet  . nitroGLYCERIN (NITROSTAT) 0.4 MG SL tablet  . traZODone (DESYREL) 100 MG tablet  . VASCEPA 1 g capsule   No current facility-administered medications for this  encounter.    Myra Gianotti, PA-C Surgical Short Stay/Anesthesiology [facility] Phone [phone] [facility] Phone [phone] 02/28/2020 7:27 PM

## 2020-03-03 ENCOUNTER — Other Ambulatory Visit (HOSPITAL_COMMUNITY): Payer: Medicaid Other

## 2020-03-03 NOTE — Anesthesia Preprocedure Evaluation (Addendum)
Anesthesia Evaluation  Patient identified by MRN, date of birth, ID band Patient awake    Reviewed: Allergy & Precautions, H&P , NPO status , Patient's Chart, lab work & pertinent test results  Airway Mallampati: II   Neck ROM: full    Dental   Pulmonary Current Smoker and Patient abstained from smoking.,    breath sounds clear to auscultation       Cardiovascular hypertension, + CAD, + Past MI, + Cardiac Stents and + Peripheral Vascular Disease  + dysrhythmias  Rhythm:regular Rate:Normal     Neuro/Psych PSYCHIATRIC DISORDERS Anxiety Depression    GI/Hepatic   Endo/Other    Renal/GU Renal InsufficiencyRenal diseaseRenal artery stent     Musculoskeletal  (+) Arthritis ,   Abdominal   Peds  Hematology   Anesthesia Other Findings   Reproductive/Obstetrics                             Anesthesia Physical Anesthesia Plan  ASA: III  Anesthesia Plan: General   Post-op Pain Management:    Induction: Intravenous  PONV Risk Score and Plan: 1 and Ondansetron, Dexamethasone and Treatment may vary due to age or medical condition  Airway Management Planned: Oral ETT  Additional Equipment: Arterial line  Intra-op Plan:   Post-operative Plan: Extubation in OR  Informed Consent: I have reviewed the patients History and Physical, chart, labs and discussed the procedure including the risks, benefits and alternatives for the proposed anesthesia with the patient or authorized representative who has indicated his/her understanding and acceptance.       Plan Discussed with: CRNA, Anesthesiologist and Surgeon  Anesthesia Plan Comments: (PAT note written by Myra Gianotti, PA-C. )       Anesthesia Quick Evaluation

## 2020-03-04 ENCOUNTER — Inpatient Hospital Stay (HOSPITAL_COMMUNITY): Payer: Medicare (Managed Care) | Admitting: Certified Registered"

## 2020-03-04 ENCOUNTER — Encounter (HOSPITAL_COMMUNITY): Payer: Self-pay | Admitting: Vascular Surgery

## 2020-03-04 ENCOUNTER — Other Ambulatory Visit: Payer: Self-pay

## 2020-03-04 ENCOUNTER — Inpatient Hospital Stay (HOSPITAL_COMMUNITY)
Admission: RE | Admit: 2020-03-04 | Discharge: 2020-03-06 | DRG: 038 | Disposition: A | Discharge status: Home / Self Care | Payer: Medicare (Managed Care) | Attending: Vascular Surgery | Admitting: Vascular Surgery

## 2020-03-04 ENCOUNTER — Encounter (HOSPITAL_COMMUNITY)
Admission: RE | Disposition: A | Discharge status: Home / Self Care | Payer: Self-pay | Source: Home / Self Care | Attending: Vascular Surgery

## 2020-03-04 ENCOUNTER — Inpatient Hospital Stay (HOSPITAL_COMMUNITY): Payer: Medicare (Managed Care) | Admitting: Vascular Surgery

## 2020-03-04 DIAGNOSIS — E782 Mixed hyperlipidemia: Secondary | ICD-10-CM | POA: Diagnosis present

## 2020-03-04 DIAGNOSIS — I6523 Occlusion and stenosis of bilateral carotid arteries: Secondary | ICD-10-CM | POA: Diagnosis present

## 2020-03-04 DIAGNOSIS — Z7982 Long term (current) use of aspirin: Secondary | ICD-10-CM

## 2020-03-04 DIAGNOSIS — Z955 Presence of coronary angioplasty implant and graft: Secondary | ICD-10-CM | POA: Diagnosis not present

## 2020-03-04 DIAGNOSIS — I252 Old myocardial infarction: Secondary | ICD-10-CM | POA: Diagnosis not present

## 2020-03-04 DIAGNOSIS — I70209 Unspecified atherosclerosis of native arteries of extremities, unspecified extremity: Secondary | ICD-10-CM | POA: Diagnosis not present

## 2020-03-04 DIAGNOSIS — I97191 Other postprocedural cardiac functional disturbances following other surgery: Secondary | ICD-10-CM | POA: Diagnosis not present

## 2020-03-04 DIAGNOSIS — I4891 Unspecified atrial fibrillation: Secondary | ICD-10-CM | POA: Diagnosis not present

## 2020-03-04 DIAGNOSIS — Z20822 Contact with and (suspected) exposure to covid-19: Secondary | ICD-10-CM | POA: Diagnosis present

## 2020-03-04 DIAGNOSIS — Z95820 Peripheral vascular angioplasty status with implants and grafts: Secondary | ICD-10-CM | POA: Diagnosis not present

## 2020-03-04 DIAGNOSIS — R001 Bradycardia, unspecified: Secondary | ICD-10-CM | POA: Diagnosis not present

## 2020-03-04 DIAGNOSIS — F17210 Nicotine dependence, cigarettes, uncomplicated: Secondary | ICD-10-CM | POA: Diagnosis present

## 2020-03-04 DIAGNOSIS — M109 Gout, unspecified: Secondary | ICD-10-CM | POA: Diagnosis present

## 2020-03-04 DIAGNOSIS — I10 Essential (primary) hypertension: Secondary | ICD-10-CM | POA: Diagnosis present

## 2020-03-04 DIAGNOSIS — F419 Anxiety disorder, unspecified: Secondary | ICD-10-CM | POA: Diagnosis not present

## 2020-03-04 DIAGNOSIS — Z885 Allergy status to narcotic agent status: Secondary | ICD-10-CM

## 2020-03-04 DIAGNOSIS — F32A Depression, unspecified: Secondary | ICD-10-CM | POA: Diagnosis not present

## 2020-03-04 DIAGNOSIS — I2510 Atherosclerotic heart disease of native coronary artery without angina pectoris: Secondary | ICD-10-CM | POA: Diagnosis present

## 2020-03-04 DIAGNOSIS — Z79899 Other long term (current) drug therapy: Secondary | ICD-10-CM

## 2020-03-04 DIAGNOSIS — Z8249 Family history of ischemic heart disease and other diseases of the circulatory system: Secondary | ICD-10-CM | POA: Diagnosis not present

## 2020-03-04 DIAGNOSIS — I6522 Occlusion and stenosis of left carotid artery: Secondary | ICD-10-CM | POA: Diagnosis present

## 2020-03-04 DIAGNOSIS — Z7902 Long term (current) use of antithrombotics/antiplatelets: Secondary | ICD-10-CM | POA: Diagnosis not present

## 2020-03-04 DIAGNOSIS — Z8673 Personal history of transient ischemic attack (TIA), and cerebral infarction without residual deficits: Secondary | ICD-10-CM | POA: Diagnosis not present

## 2020-03-04 HISTORY — PX: PATCH ANGIOPLASTY: SHX6230

## 2020-03-04 HISTORY — PX: ENDARTERECTOMY: SHX5162

## 2020-03-04 LAB — SARS CORONAVIRUS 2 BY RT PCR (HOSPITAL ORDER, PERFORMED IN ~~LOC~~ HOSPITAL LAB): SARS Coronavirus 2: NEGATIVE

## 2020-03-04 LAB — GLUCOSE, CAPILLARY: Glucose-Capillary: 134 mg/dL — ABNORMAL HIGH (ref 70–99)

## 2020-03-04 LAB — ABO/RH: ABO/RH(D): O NEG

## 2020-03-04 SURGERY — ENDARTERECTOMY, CAROTID
Anesthesia: General | Site: Neck | Laterality: Left

## 2020-03-04 MED ORDER — HYDRALAZINE HCL 20 MG/ML IJ SOLN
5.0000 mg | Freq: Once | INTRAMUSCULAR | Status: AC
  Administered 2020-03-04: 5 mg via INTRAVENOUS

## 2020-03-04 MED ORDER — ROCURONIUM BROMIDE 10 MG/ML (PF) SYRINGE
PREFILLED_SYRINGE | INTRAVENOUS | Status: DC | PRN
  Administered 2020-03-04: 60 mg via INTRAVENOUS

## 2020-03-04 MED ORDER — MORPHINE SULFATE (PF) 2 MG/ML IV SOLN
2.0000 mg | INTRAVENOUS | Status: DC | PRN
  Administered 2020-03-04: 2 mg via INTRAVENOUS
  Filled 2020-03-04: qty 1

## 2020-03-04 MED ORDER — LOSARTAN POTASSIUM-HCTZ 100-12.5 MG PO TABS: 1.0000 | ORAL_TABLET | Freq: Every day | ORAL | Status: DC

## 2020-03-04 MED ORDER — CEFAZOLIN SODIUM-DEXTROSE 2-4 GM/100ML-% IV SOLN
2.0000 g | Freq: Three times a day (TID) | INTRAVENOUS | Status: AC
  Administered 2020-03-04: 2 g via INTRAVENOUS
  Filled 2020-03-04 (×2): qty 100

## 2020-03-04 MED ORDER — CHLORHEXIDINE GLUCONATE CLOTH 2 % EX PADS: 6.0000 | MEDICATED_PAD | Freq: Once | CUTANEOUS | Status: DC

## 2020-03-04 MED ORDER — HYDROXYZINE HCL 25 MG PO TABS
25.0000 mg | ORAL_TABLET | Freq: Every day | ORAL | Status: DC
  Administered 2020-03-04 – 2020-03-05 (×2): 25 mg via ORAL
  Filled 2020-03-04 (×2): qty 1

## 2020-03-04 MED ORDER — CHLORHEXIDINE GLUCONATE 0.12 % MT SOLN: 15.0000 mL | Freq: Once | OROMUCOSAL | Status: AC

## 2020-03-04 MED ORDER — FENTANYL CITRATE (PF) 250 MCG/5ML IJ SOLN
INTRAMUSCULAR | Status: DC | PRN
  Administered 2020-03-04: 100 ug via INTRAVENOUS

## 2020-03-04 MED ORDER — LIDOCAINE HCL 1 % IJ SOLN
INTRAMUSCULAR | Status: AC
  Filled 2020-03-04: qty 20

## 2020-03-04 MED ORDER — SODIUM CHLORIDE 0.9 % IV SOLN
0.0125 ug/kg/min | INTRAVENOUS | Status: AC
  Administered 2020-03-04: .05 ug/kg/min via INTRAVENOUS
  Filled 2020-03-04: qty 2000

## 2020-03-04 MED ORDER — PROPOFOL 10 MG/ML IV BOLUS
INTRAVENOUS | Status: DC | PRN
  Administered 2020-03-04: 150 mg via INTRAVENOUS

## 2020-03-04 MED ORDER — PHENYLEPHRINE HCL (PRESSORS) 10 MG/ML IV SOLN
INTRAVENOUS | Status: DC | PRN
  Administered 2020-03-04: 80 ug via INTRAVENOUS
  Administered 2020-03-04: 40 ug via INTRAVENOUS
  Administered 2020-03-04: 80 ug via INTRAVENOUS

## 2020-03-04 MED ORDER — FENTANYL CITRATE (PF) 100 MCG/2ML IJ SOLN
INTRAMUSCULAR | Status: AC
  Filled 2020-03-04: qty 2

## 2020-03-04 MED ORDER — SODIUM CHLORIDE 0.9 % IV SOLN
INTRAVENOUS | Status: AC
  Filled 2020-03-04: qty 1.2

## 2020-03-04 MED ORDER — ROCURONIUM BROMIDE 10 MG/ML (PF) SYRINGE
PREFILLED_SYRINGE | INTRAVENOUS | Status: AC
  Filled 2020-03-04: qty 10

## 2020-03-04 MED ORDER — PHENYLEPHRINE HCL-NACL 10-0.9 MG/250ML-% IV SOLN: INTRAVENOUS | Status: DC | PRN

## 2020-03-04 MED ORDER — LIDOCAINE HCL (PF) 2 % IJ SOLN
INTRAMUSCULAR | Status: AC
  Filled 2020-03-04: qty 5

## 2020-03-04 MED ORDER — ONDANSETRON HCL 4 MG/2ML IJ SOLN
INTRAMUSCULAR | Status: DC | PRN
  Administered 2020-03-04: 4 mg via INTRAVENOUS

## 2020-03-04 MED ORDER — OXYCODONE HCL 5 MG PO TABS: 5.0000 mg | ORAL_TABLET | Freq: Once | ORAL | Status: DC | PRN

## 2020-03-04 MED ORDER — HYDROCHLOROTHIAZIDE 12.5 MG PO CAPS
12.5000 mg | ORAL_CAPSULE | Freq: Every day | ORAL | Status: DC
  Administered 2020-03-04 – 2020-03-06 (×3): 12.5 mg via ORAL
  Filled 2020-03-04 (×3): qty 1

## 2020-03-04 MED ORDER — PROTAMINE SULFATE 10 MG/ML IV SOLN
INTRAVENOUS | Status: AC
  Filled 2020-03-04: qty 5

## 2020-03-04 MED ORDER — POLYETHYLENE GLYCOL 3350 17 G PO PACK: 17.0000 g | PACK | Freq: Every day | ORAL | Status: DC | PRN

## 2020-03-04 MED ORDER — OXYCODONE HCL 5 MG/5ML PO SOLN: 5.0000 mg | Freq: Once | ORAL | Status: DC | PRN

## 2020-03-04 MED ORDER — LABETALOL HCL 5 MG/ML IV SOLN
10.0000 mg | INTRAVENOUS | Status: DC | PRN
  Administered 2020-03-05: 10 mg via INTRAVENOUS
  Filled 2020-03-04: qty 4

## 2020-03-04 MED ORDER — ACETAMINOPHEN 325 MG PO TABS: 325.0000 mg | ORAL_TABLET | ORAL | Status: DC | PRN

## 2020-03-04 MED ORDER — MIDAZOLAM HCL 2 MG/2ML IJ SOLN
INTRAMUSCULAR | Status: AC
  Filled 2020-03-04: qty 2

## 2020-03-04 MED ORDER — METOPROLOL TARTRATE 5 MG/5ML IV SOLN: 2.0000 mg | INTRAVENOUS | Status: DC | PRN

## 2020-03-04 MED ORDER — AMLODIPINE BESYLATE 10 MG PO TABS
10.0000 mg | ORAL_TABLET | Freq: Every day | ORAL | Status: DC
  Administered 2020-03-05 – 2020-03-06 (×2): 10 mg via ORAL
  Filled 2020-03-04 (×2): qty 1

## 2020-03-04 MED ORDER — FENTANYL CITRATE (PF) 100 MCG/2ML IJ SOLN
25.0000 ug | INTRAMUSCULAR | Status: DC | PRN
  Administered 2020-03-04: 50 ug via INTRAVENOUS

## 2020-03-04 MED ORDER — HYDRALAZINE HCL 20 MG/ML IJ SOLN
INTRAMUSCULAR | Status: AC
  Filled 2020-03-04: qty 1

## 2020-03-04 MED ORDER — ONDANSETRON HCL 4 MG/2ML IJ SOLN
4.0000 mg | Freq: Four times a day (QID) | INTRAMUSCULAR | Status: DC | PRN
  Administered 2020-03-04: 4 mg via INTRAVENOUS
  Filled 2020-03-04: qty 2

## 2020-03-04 MED ORDER — ALUM & MAG HYDROXIDE-SIMETH 200-200-20 MG/5ML PO SUSP: 15.0000 mL | ORAL | Status: DC | PRN

## 2020-03-04 MED ORDER — SODIUM CHLORIDE 0.9 % IV SOLN: 500.0000 mL | Freq: Once | INTRAVENOUS | Status: DC | PRN

## 2020-03-04 MED ORDER — NITROGLYCERIN 0.4 MG SL SUBL: 0.4000 mg | SUBLINGUAL_TABLET | SUBLINGUAL | Status: DC | PRN

## 2020-03-04 MED ORDER — PROPOFOL 10 MG/ML IV BOLUS
INTRAVENOUS | Status: AC
  Filled 2020-03-04: qty 20

## 2020-03-04 MED ORDER — ORAL CARE MOUTH RINSE: 15.0000 mL | Freq: Once | OROMUCOSAL | Status: AC

## 2020-03-04 MED ORDER — PHENYLEPHRINE HCL-NACL 10-0.9 MG/250ML-% IV SOLN
INTRAVENOUS | Status: DC | PRN
  Administered 2020-03-04: 20 ug/min via INTRAVENOUS

## 2020-03-04 MED ORDER — HEPARIN SODIUM (PORCINE) 1000 UNIT/ML IJ SOLN
INTRAMUSCULAR | Status: DC | PRN
  Administered 2020-03-04: 8000 [IU] via INTRAVENOUS

## 2020-03-04 MED ORDER — GUAIFENESIN-DM 100-10 MG/5ML PO SYRP: 15.0000 mL | ORAL_SOLUTION | ORAL | Status: DC | PRN

## 2020-03-04 MED ORDER — ICOSAPENT ETHYL 1 G PO CAPS
2.0000 g | ORAL_CAPSULE | Freq: Two times a day (BID) | ORAL | Status: DC
  Administered 2020-03-04 – 2020-03-06 (×4): 2 g via ORAL
  Filled 2020-03-04 (×5): qty 2

## 2020-03-04 MED ORDER — HYDRALAZINE HCL 20 MG/ML IJ SOLN
INTRAMUSCULAR | Status: DC | PRN
  Administered 2020-03-04: 2 mg via INTRAVENOUS
  Administered 2020-03-04: 4 mg via INTRAVENOUS
  Administered 2020-03-04 (×2): 2 mg via INTRAVENOUS
  Administered 2020-03-04: 4 mg via INTRAVENOUS

## 2020-03-04 MED ORDER — SODIUM CHLORIDE 0.9 % IV SOLN: INTRAVENOUS | Status: DC

## 2020-03-04 MED ORDER — DEXAMETHASONE SODIUM PHOSPHATE 10 MG/ML IJ SOLN
INTRAMUSCULAR | Status: DC | PRN
  Administered 2020-03-04: 5 mg via INTRAVENOUS

## 2020-03-04 MED ORDER — LOSARTAN POTASSIUM 50 MG PO TABS
100.0000 mg | ORAL_TABLET | Freq: Every day | ORAL | Status: DC
  Administered 2020-03-04 – 2020-03-06 (×3): 100 mg via ORAL
  Filled 2020-03-04 (×3): qty 2

## 2020-03-04 MED ORDER — FENTANYL CITRATE (PF) 250 MCG/5ML IJ SOLN
INTRAMUSCULAR | Status: AC
  Filled 2020-03-04: qty 5

## 2020-03-04 MED ORDER — POTASSIUM CHLORIDE CRYS ER 20 MEQ PO TBCR: 20.0000 meq | EXTENDED_RELEASE_TABLET | Freq: Every day | ORAL | Status: DC | PRN

## 2020-03-04 MED ORDER — ATORVASTATIN CALCIUM 80 MG PO TABS
80.0000 mg | ORAL_TABLET | Freq: Every evening | ORAL | Status: DC
  Administered 2020-03-04 – 2020-03-05 (×2): 80 mg via ORAL
  Filled 2020-03-04 (×2): qty 1

## 2020-03-04 MED ORDER — ASPIRIN 81 MG PO CHEW
81.0000 mg | CHEWABLE_TABLET | Freq: Every day | ORAL | Status: DC
  Administered 2020-03-05 – 2020-03-06 (×2): 81 mg via ORAL
  Filled 2020-03-04 (×2): qty 1

## 2020-03-04 MED ORDER — ESCITALOPRAM OXALATE 20 MG PO TABS
20.0000 mg | ORAL_TABLET | Freq: Every day | ORAL | Status: DC
  Administered 2020-03-05 – 2020-03-06 (×2): 20 mg via ORAL
  Filled 2020-03-04 (×2): qty 1

## 2020-03-04 MED ORDER — CEFAZOLIN SODIUM-DEXTROSE 2-4 GM/100ML-% IV SOLN
2.0000 g | INTRAVENOUS | Status: AC
  Administered 2020-03-04: 2 g via INTRAVENOUS

## 2020-03-04 MED ORDER — CHLORHEXIDINE GLUCONATE 0.12 % MT SOLN
OROMUCOSAL | Status: AC
  Administered 2020-03-04: 15 mL via OROMUCOSAL
  Filled 2020-03-04: qty 15

## 2020-03-04 MED ORDER — SODIUM CHLORIDE 0.9 % IV SOLN
INTRAVENOUS | Status: DC | PRN
  Administered 2020-03-04: 500 mL

## 2020-03-04 MED ORDER — MAGNESIUM SULFATE 2 GM/50ML IV SOLN: 2.0000 g | Freq: Every day | INTRAVENOUS | Status: DC | PRN

## 2020-03-04 MED ORDER — TRAZODONE HCL 100 MG PO TABS
100.0000 mg | ORAL_TABLET | Freq: Every day | ORAL | Status: DC
  Administered 2020-03-04 – 2020-03-05 (×2): 100 mg via ORAL
  Filled 2020-03-04 (×2): qty 1

## 2020-03-04 MED ORDER — PANTOPRAZOLE SODIUM 40 MG PO TBEC
40.0000 mg | DELAYED_RELEASE_TABLET | Freq: Every day | ORAL | Status: DC
  Administered 2020-03-04 – 2020-03-06 (×3): 40 mg via ORAL
  Filled 2020-03-04 (×3): qty 1

## 2020-03-04 MED ORDER — LACTATED RINGERS IV SOLN: INTRAVENOUS | Status: DC

## 2020-03-04 MED ORDER — ACETAMINOPHEN 650 MG RE SUPP: 325.0000 mg | RECTAL | Status: DC | PRN

## 2020-03-04 MED ORDER — PHENOL 1.4 % MT LIQD: 1.0000 | OROMUCOSAL | Status: DC | PRN

## 2020-03-04 MED ORDER — ALLOPURINOL 100 MG PO TABS
100.0000 mg | ORAL_TABLET | Freq: Every day | ORAL | Status: DC
  Administered 2020-03-05 – 2020-03-06 (×2): 100 mg via ORAL
  Filled 2020-03-04 (×2): qty 1

## 2020-03-04 MED ORDER — LIDOCAINE 2% (20 MG/ML) 5 ML SYRINGE
INTRAMUSCULAR | Status: DC | PRN
  Administered 2020-03-04: 40 mg via INTRAVENOUS

## 2020-03-04 MED ORDER — 0.9 % SODIUM CHLORIDE (POUR BTL) OPTIME
TOPICAL | Status: DC | PRN
  Administered 2020-03-04: 1000 mL

## 2020-03-04 MED ORDER — OXYCODONE-ACETAMINOPHEN 5-325 MG PO TABS
1.0000 | ORAL_TABLET | ORAL | Status: DC | PRN
  Administered 2020-03-04 – 2020-03-05 (×2): 2 via ORAL
  Administered 2020-03-06: 1 via ORAL
  Filled 2020-03-04: qty 2
  Filled 2020-03-04: qty 1
  Filled 2020-03-04 (×2): qty 2

## 2020-03-04 MED ORDER — GLYCOPYRROLATE 0.2 MG/ML IJ SOLN
INTRAMUSCULAR | Status: DC | PRN
  Administered 2020-03-04: .2 mg via INTRAVENOUS

## 2020-03-04 MED ORDER — ONDANSETRON HCL 4 MG/2ML IJ SOLN: 4.0000 mg | Freq: Four times a day (QID) | INTRAMUSCULAR | Status: DC | PRN

## 2020-03-04 MED ORDER — CEFAZOLIN SODIUM-DEXTROSE 2-4 GM/100ML-% IV SOLN
INTRAVENOUS | Status: AC
  Administered 2020-03-04: 2 g via INTRAVENOUS
  Filled 2020-03-04: qty 100

## 2020-03-04 MED ORDER — PROTAMINE SULFATE 10 MG/ML IV SOLN
INTRAVENOUS | Status: DC | PRN
  Administered 2020-03-04: 50 mg via INTRAVENOUS

## 2020-03-04 MED ORDER — BISACODYL 10 MG RE SUPP: 10.0000 mg | Freq: Every day | RECTAL | Status: DC | PRN

## 2020-03-04 MED ORDER — DOCUSATE SODIUM 100 MG PO CAPS
100.0000 mg | ORAL_CAPSULE | Freq: Every day | ORAL | Status: DC
  Administered 2020-03-05 – 2020-03-06 (×2): 100 mg via ORAL
  Filled 2020-03-04 (×2): qty 1

## 2020-03-04 MED ORDER — HYDRALAZINE HCL 20 MG/ML IJ SOLN
5.0000 mg | INTRAMUSCULAR | Status: AC | PRN
  Administered 2020-03-04 (×2): 5 mg via INTRAVENOUS
  Filled 2020-03-04 (×2): qty 1

## 2020-03-04 SURGICAL SUPPLY — 39 items
CANISTER SUCT 3000ML PPV (MISCELLANEOUS) ×4 IMPLANT
CANNULA VESSEL 3MM 2 BLNT TIP (CANNULA) ×8 IMPLANT
CATH ROBINSON RED A/P 18FR (CATHETERS) ×4 IMPLANT
CLIP LIGATING EXTRA MED SLVR (CLIP) ×4 IMPLANT
CLIP LIGATING EXTRA SM BLUE (MISCELLANEOUS) ×4 IMPLANT
DERMABOND ADVANCED (GAUZE/BANDAGES/DRESSINGS) ×2
DERMABOND ADVANCED .7 DNX12 (GAUZE/BANDAGES/DRESSINGS) ×2 IMPLANT
DRAIN HEMOVAC 1/8 X 5 (WOUND CARE) IMPLANT
ELECT REM PT RETURN 9FT ADLT (ELECTROSURGICAL) ×4
ELECTRODE REM PT RTRN 9FT ADLT (ELECTROSURGICAL) ×2 IMPLANT
EVACUATOR SILICONE 100CC (DRAIN) IMPLANT
GLOVE BIO SURGEON STRL SZ 6.5 (GLOVE) ×6 IMPLANT
GLOVE BIO SURGEONS STRL SZ 6.5 (GLOVE) ×2
GLOVE ECLIPSE 6.5 STRL STRAW (GLOVE) ×4 IMPLANT
GLOVE SS BIOGEL STRL SZ 7.5 (GLOVE) ×2 IMPLANT
GLOVE SUPERSENSE BIOGEL SZ 7.5 (GLOVE) ×2
GOWN STRL REUS W/ TWL LRG LVL3 (GOWN DISPOSABLE) ×8 IMPLANT
GOWN STRL REUS W/TWL LRG LVL3 (GOWN DISPOSABLE) ×16
KIT BASIN OR (CUSTOM PROCEDURE TRAY) ×4 IMPLANT
KIT SHUNT ARGYLE CAROTID ART 6 (VASCULAR PRODUCTS) IMPLANT
KIT TURNOVER KIT B (KITS) ×4 IMPLANT
NEEDLE 22X1 1/2 (OR ONLY) (NEEDLE) IMPLANT
NS IRRIG 1000ML POUR BTL (IV SOLUTION) ×8 IMPLANT
PACK CAROTID (CUSTOM PROCEDURE TRAY) ×4 IMPLANT
PAD ARMBOARD 7.5X6 YLW CONV (MISCELLANEOUS) ×8 IMPLANT
PATCH HEMASHIELD 8X75 (Vascular Products) ×4 IMPLANT
POSITIONER HEAD DONUT 9IN (MISCELLANEOUS) ×4 IMPLANT
SHUNT CAROTID BYPASS 10 (VASCULAR PRODUCTS) ×4 IMPLANT
SHUNT CAROTID BYPASS 12FRX15.5 (VASCULAR PRODUCTS) IMPLANT
SUT ETHILON 3 0 PS 1 (SUTURE) IMPLANT
SUT PROLENE 6 0 CC (SUTURE) ×4 IMPLANT
SUT SILK 3 0 (SUTURE)
SUT SILK 3-0 18XBRD TIE 12 (SUTURE) IMPLANT
SUT VIC AB 3-0 SH 27 (SUTURE) ×8
SUT VIC AB 3-0 SH 27X BRD (SUTURE) ×4 IMPLANT
SUT VICRYL 4-0 PS2 18IN ABS (SUTURE) ×4 IMPLANT
SYR CONTROL 10ML LL (SYRINGE) IMPLANT
TOWEL GREEN STERILE (TOWEL DISPOSABLE) ×4 IMPLANT
WATER STERILE IRR 1000ML POUR (IV SOLUTION) ×4 IMPLANT

## 2020-03-04 NOTE — H&P (Signed)
Office Visit 01/21/2020 [facility]   Monie Shere, Arvilla Meres, MD Vascular Surgery  Bilateral carotid artery stenosis Dx  New Patient (Initial Visit); Referred by Ricke Hey, MD Reason for Visit  Additional Documentation  Vitals:  BP 123/76  Pulse 46Important   Temp 97.3 F (36.3 C)Important (Other (Comment))  Resp 18  Ht 5\' 6"  (1.676 m)  Wt 83 kg  SpO2 97%  BMI 29.54 kg/m  BSA 1.97 m    More Vitals  Flowsheets:  MEWS Score,  Anthropometrics,  NEWS,  Infectious Disease Screening,  Vital Signs,  Method of Visit    Encounter Info:  Billing Info,  History,  Allergies,  Detailed Report    All Notes  Progress Notes by Rosetta Posner, MD at 01/21/2020 2:40 PM Author: Rosetta Posner, MD Author Type: Physician Filed: 01/21/2020 4:47 PM  Note Status: Signed Cosign: Cosign Not Required Encounter Date: 01/21/2020  Editor: Rosetta Posner, MD (Physician)                  [facility] [place]  Patient name: [name]    MRN: [id number]        DOB: [birth year]        Sex: male  REASON FOR CONSULT: Evaluation severe bilateral carotid disease  HPI: [name] is a 64 y.o. male, here today for evaluation.  He was found to have severe bilateral carotid disease at [facility] cardiovascular duplex.  Is here for further discussion.  He reports having episodes in the past with neurologic changes but specifically denies any focal neurologic deficits.  He does have history of coronary artery disease with history of myocardial infarction in the past      Past Medical History:  Diagnosis Date  . Anxiety   . Arthritis   . Coronary artery disease   . Dementia ([place])   . Depression   . Diabetes mellitus   . Gout   . Hyperlipidemia   . Hypertension   . PAD (peripheral artery disease) ([place])   . TIA (transient ischemic attack)          Family History  Problem Relation Age of Onset  . Heart attack Mother    . Heart attack Father   . IgA nephropathy Son     SOCIAL HISTORY: Social History   Socioeconomic History  . Marital status: Legally Separated    Spouse name: Not on file  . Number of children: 2  . Years of education: Not on file  . Highest education level: Not on file  Occupational History  . Not on file  Tobacco Use  . Smoking status: Current Some Day Smoker    Packs/day: 0.50    Types: Cigarettes  . Smokeless tobacco: Never Used  Vaping Use  . Vaping Use: Never used  Substance and Sexual Activity  . Alcohol use: Not Currently    Comment: occasionally  . Drug use: Yes    Types: Marijuana  . Sexual activity: Not on file  Other Topics Concern  . Not on file  Social History Narrative  . Not on file   Social Determinants of Health      Financial Resource Strain:   . Difficulty of Paying Living Expenses: Not on file  Food Insecurity:   . Worried About Charity fundraiser in the Last Year: Not on file  . Ran Out of Food in the Last Year: Not on file  Transportation Needs:   .  Lack of Transportation (Medical): Not on file  . Lack of Transportation (Non-Medical): Not on file  Physical Activity:   . Days of Exercise per Week: Not on file  . Minutes of Exercise per Session: Not on file  Stress:   . Feeling of Stress : Not on file  Social Connections:   . Frequency of Communication with Friends and Family: Not on file  . Frequency of Social Gatherings with Friends and Family: Not on file  . Attends Religious Services: Not on file  . Active Member of Clubs or Organizations: Not on file  . Attends Archivist Meetings: Not on file  . Marital Status: Not on file  Intimate Partner Violence:   . Fear of Current or Ex-Partner: Not on file  . Emotionally Abused: Not on file  . Physically Abused: Not on file  . Sexually Abused: Not on file        Allergies  Allergen Reactions  . Vicodin [Hydrocodone-Acetaminophen] Nausea And Vomiting           Current Outpatient Medications  Medication Sig Dispense Refill  . allopurinol (ZYLOPRIM) 100 MG tablet Take 100 mg by mouth daily.    Marland Kitchen amLODipine (NORVASC) 10 MG tablet Take 10 mg by mouth daily. Taking it in the morning    . aspirin 81 MG chewable tablet Chew 1 tablet (81 mg total) by mouth daily. 30 tablet 1  . atorvastatin (LIPITOR) 80 MG tablet TAKE 1 TABLET BY MOUTH DAILY (Patient taking differently: Taking in the evening with food) 90 tablet 3  . BRILINTA 60 MG TABS tablet TAKE 1 TABLET BY MOUTH TWICE DAILY 180 tablet 3  . Colchicine 0.6 MG CAPS take 1 capsule by mouth once daily  0  . escitalopram (LEXAPRO) 20 MG tablet Take 20 mg by mouth daily.    Marland Kitchen esomeprazole (NEXIUM) 20 MG capsule Take 20 mg by mouth daily at 12 noon.    . hydrOXYzine (ATARAX/VISTARIL) 25 MG tablet Take 25 mg by mouth daily as needed (sleep).    . losartan-hydrochlorothiazide (HYZAAR) 100-12.5 MG tablet Take 1 tablet by mouth daily. Taking in the morning    . metoprolol tartrate (LOPRESSOR) 100 MG tablet TAKE 1/2 TABLET(50 MG) BY MOUTH DAILY 30 tablet 1  . nitroGLYCERIN (NITROSTAT) 0.4 MG SL tablet Place 1 tablet (0.4 mg total) under the tongue every 5 (five) minutes as needed. For chest pain 30 tablet 0  . traZODone (DESYREL) 100 MG tablet Take 100 mg by mouth at bedtime.    Marland Kitchen VASCEPA 1 g capsule TAKE 2 CAPSULES(2 GRAMS) BY MOUTH TWICE DAILY 120 capsule 3   No current facility-administered medications for this visit.    REVIEW OF SYSTEMS:  [X]  denotes positive finding, [ ]  denotes negative finding Cardiac  Comments:  Chest pain or chest pressure:    Shortness of breath upon exertion:    Short of breath when lying flat:    Irregular heart rhythm:        Vascular    Pain in calf, thigh, or hip brought on by ambulation:    Pain in feet at night that wakes you up from your sleep:     Blood clot in your veins:    Leg swelling:         Pulmonary     Oxygen at home:    Productive cough:     Wheezing:         Neurologic    Sudden weakness in arms or  legs:     Sudden numbness in arms or legs:     Sudden onset of difficulty speaking or slurred speech: x   Temporary loss of vision in one eye:     Problems with dizziness:  x       Gastrointestinal    Blood in stool:     Vomited blood:         Genitourinary    Burning when urinating:     Blood in urine:        Psychiatric    Major depression:         Hematologic    Bleeding problems:    Problems with blood clotting too easily:        Skin    Rashes or ulcers:        Constitutional    Fever or chills:      PHYSICAL EXAM:    Vitals:   01/21/20 1423  BP: 123/76  Pulse: (!) 46  Resp: 18  Temp: (!) 97.3 F (36.3 C)  TempSrc: Other (Comment)  SpO2: 97%  Weight: 183 lb (83 kg)  Height: 5\' 6"  (1.676 m)    GENERAL: The patient is a well-nourished male, in no acute distress. The vital signs are documented above. CARDIAC: There is a regular rate and rhythm.  VASCULAR: 2+ radial pulses bilaterally.  I do not hear carotid bruits bilaterally PULMONARY: There is good air exchange bilaterally without wheezing or rales. ABDOMEN: Soft and non-tender with normal pitched bowel sounds.  MUSCULOSKELETAL: There are no major deformities or cyanosis. NEUROLOGIC: No focal weakness or paresthesias are detected. SKIN: There are no ulcers or rashes noted. PSYCHIATRIC: The patient has a normal affect.  DATA:   Duplex from Dr. Irven Shelling office was reviewed revealing greater than 80% ICA stenoses bilaterally  MEDICAL ISSUES:  Had long discussion with the patient regarding this.  Is not clear as to whether or not he has any focal deficits.  He is a very poor historian.  I have recommended CT angiogram for further evaluation of his probable bilateral critical disease.  I will give him a call following procedure  for further recommendations.  I explained that if he does have critical disease that we would recommend endarterectomy.  If this is critical bilaterally would recommend staged bilateral carotid endarterectomies.  He is right-handed so we would recommend left endarterectomy first.  I will contact him following his CT angiogram   [name] [facility] [place] Office phone [phone]     Addendum:  The patient has been re-examined and re-evaluated.  The patient's history and physical has been reviewed and is unchanged.    [name] is a 64 y.o. male is being admitted with BILATERAL CAROTID STENOSIS. All the risks, benefits and other treatment options have been discussed with the patient. The patient has consented to proceed with Procedure(s): LEFT ENDARTERECTOMY CAROTID as a surgical intervention.  [name] 03/04/2020 7:07 AM Vascular and Vein Surgery

## 2020-03-04 NOTE — Progress Notes (Signed)
Pt transferred to rm [id number], oriented to unit.  CHG completed, VSS.

## 2020-03-04 NOTE — Anesthesia Procedure Notes (Signed)
Arterial Line Insertion Start/End12/09/2019 7:05 AM, 03/04/2020 7:07 AM Performed by: Albertha Ghee, MD, Myna Bright, CRNA, anesthesiologist  Patient location: Pre-op. Preanesthetic checklist: patient identified, IV checked, site marked, risks and benefits discussed, surgical consent, monitors and equipment checked, pre-op evaluation, timeout performed and anesthesia consent Lidocaine 1% used for infiltration Right, radial was placed Catheter size: 20 Fr Hand hygiene performed  and maximum sterile barriers used   Attempts: 1 Procedure performed without using ultrasound guided technique. Following insertion, dressing applied. Post procedure assessment: normal and unchanged  Patient tolerated the procedure well with no immediate complications.

## 2020-03-04 NOTE — Progress Notes (Signed)
  Day of Surgery Note    Subjective:  No complaints; resting comfortably in recovery   Vitals:   03/04/20 1035 03/04/20 1041  BP: (!) 157/59 (!) 173/57  Pulse: 61   Resp: (!) 22   Temp:    SpO2: 92%     Incisions:   Looks good without hematoma Extremities:  Moving all extremities equally Lungs:  Non labored Neuro:  In tact; tongue is midline   Assessment/Plan:  This is a 64 y.o. male who is s/p  Left carotid endarterectomy  -pt doing well in recovery and neuro in tact-moving all extremites and tongue is midline. -hypertensive-keep BP systolic < 623.  -to 4 east later today -anticipate dc tomorrow if uneventful night   Leontine Locket, PA-C 03/04/2020 10:44 AM [phone]

## 2020-03-04 NOTE — Anesthesia Procedure Notes (Signed)
Procedure Name: Intubation Date/Time: 03/04/2020 7:45 AM Performed by: Terrence Dupont, RN Pre-anesthesia Checklist: Patient identified, Emergency Drugs available, Suction available and Patient being monitored Patient Re-evaluated:Patient Re-evaluated prior to induction Oxygen Delivery Method: Circle System Utilized Preoxygenation: Pre-oxygenation with 100% oxygen Induction Type: IV induction Ventilation: Mask ventilation without difficulty Laryngoscope Size: Mac and 4 Grade View: Grade I Tube type: Oral Tube size: 7.5 mm Number of attempts: 1 Airway Equipment and Method: Stylet and Oral airway Placement Confirmation: ETT inserted through vocal cords under direct vision,  positive ETCO2 and breath sounds checked- equal and bilateral Secured at: 22 cm Tube secured with: Tape Dental Injury: Teeth and Oropharynx as per pre-operative assessment

## 2020-03-04 NOTE — Anesthesia Postprocedure Evaluation (Signed)
Anesthesia Post Note  Patient: [name]  Procedure(s) Performed: LEFT ENDARTERECTOMY CAROTID (Left ) PATCH ANGIOPLASTY (Left Neck)     Patient location during evaluation: PACU Anesthesia Type: General Level of consciousness: awake and alert Pain management: pain level controlled Vital Signs Assessment: post-procedure vital signs reviewed and stable Respiratory status: spontaneous breathing, nonlabored ventilation, respiratory function stable and patient connected to nasal cannula oxygen Cardiovascular status: blood pressure returned to baseline and stable Postop Assessment: no apparent nausea or vomiting Anesthetic complications: no   No complications documented.  Last Vitals:  Vitals:   03/04/20 1230 03/04/20 1247  BP: (!) 141/60 (!) 155/61  Pulse: (!) 53 65  Resp: 19 18  Temp: 36.7 C 36.6 C  SpO2: 92% 95%    Last Pain:  Vitals:   03/04/20 1340  TempSrc:   PainSc: [name]

## 2020-03-04 NOTE — Op Note (Signed)
   OPERATIVE REPORT  DATE OF SURGERY: 03/04/2020  PATIENT: [name], 64 y.o. male MRN: [id number]  DOB: [birth year]  PRE-OPERATIVE DIAGNOSIS: Left Carotid Stenosis, Asymptomatic  POST-OPERATIVE DIAGNOSIS:  Same  PROCEDURE:  Left Carotid Endarterectomy with Dacron Patch Angioplasty  SURGEON:  [name], M.D.  PHYSICIAN ASSISTANT: Rhyne  The assistant was needed for exposure and to expedite the case  ANESTHESIA:   general  EBL: Less than 200 ml  Total I/O In: 1100 [I.V.:1000; IV Piggyback:100] Out: 50 [Blood:50]  BLOOD ADMINISTERED: none  DRAINS: none   SPECIMEN: none  COUNTS CORRECT:  YES  PLAN OF CARE: Admit to inpatient   PATIENT DISPOSITION:  PACU - hemodynamically stable and neurologically intact.  PROCEDURE DETAILS: The patient was taken to the operating room placed in supine position.  General anesthesia was administered.  The neck was prepped and draped in the usual sterile fashion.  An incision was made anterior to the sternocleidomastoid and carried down through the platysma with electrocautery.  The sternocleidomastoid was reflected posteriorly and the carotid sheath was opened.  The facial vein was ligated with 2-0 silk ties and divided.  The common carotid artery was encircled with an umbilical tape and Rummel tourniquet.  The vagus nerve was identified and preserved.  Dissection was continued onto the carotid bifurcation.  The superior thyroid artery was encircled with a 2-0 silk Potts tie.  The external carotid was encircled with a blue vessel loop and the internal carotid was encircled with an umbilical tape and Rummel tourniquet.  The hypoglossal nerve was identified and preserved.  The patient was given systemic heparin and after adequate circulation time, the internal, external and common carotid arteries were occluded with vascular clamps.  The common carotid artery was opened with an 11 blade and extended  longitudinally with Potts scissors.  A 10 shunt  was passed up the internal carotid and allowed to backbleed.  It was then passed down the common carotid where it was secured with Rummel tourniquet.  The endarterectomy was begun on the common carotid artery and the plaque was divided proximally with Potts scissors.  The endarterectomy was continued onto the bifurcation.  The external carotid was endarterectomized with an eversion technique and the internal carotid was endarterectomized in an open fashion.  Remaining atheromatous debris was removed from the endarterectomy plane.  A Finesse Hemashield Dacron patch was brought onto the field and was sewn as a patch angioplasty with a running 6-0 Prolene suture.  Prior to completion of the closure the shunt was removed and the usual flushing maneuvers were undertaken.  The anastomosis was completed and flow was restored first to the external and then the internal carotid artery.  Excellent flow characteristics were noted with hand-held Doppler in the internal and external carotid arteries.  The patient was given 50 mg of protamine to reverse the heparin.  The wounds were irrigated with saline.  Hemostasis was obtained with electrocautery.  The wounds were closed with 3-0 Vicryl to reapproximate the sternocleidomastoid over the carotid sheath.  The platysma was lysed with a running 3-0 Vicryl suture.  The skin was closed with a 4-0 subcuticular Vicryl stitch.  Dermabond was applied.  The patient was awakened neurologically intact in the operating room and transferred to the recovery room in stable condition   [name], M.D. 03/04/2020 10:20 AM

## 2020-03-04 NOTE — Transfer of Care (Signed)
Immediate Anesthesia Transfer of Care Note  Patient: [name]  Procedure(s) Performed: LEFT ENDARTERECTOMY CAROTID (Left ) PATCH ANGIOPLASTY (Left Neck)  Patient Location: PACU  Anesthesia Type:General  Level of Consciousness: awake, alert , oriented and patient cooperative  Airway & Oxygen Therapy: Patient Spontanous Breathing and Patient connected to face mask oxygen  Post-op Assessment: Report given to RN, Post -op Vital signs reviewed and stable and Patient moving all extremities  Post vital signs: Reviewed and stable  Last Vitals:  Vitals Value Taken Time  BP 156/60 03/04/20 1019  Temp    Pulse 62 03/04/20 1021  Resp 18 03/04/20 1021  SpO2 97 % 03/04/20 1021  Vitals shown include unvalidated device data.  Last Pain:  Vitals:   03/04/20 0621  TempSrc:   PainSc: 0-No pain         Complications: No complications documented.

## 2020-03-05 ENCOUNTER — Encounter (HOSPITAL_COMMUNITY): Payer: Self-pay | Admitting: Vascular Surgery

## 2020-03-05 LAB — BASIC METABOLIC PANEL
Anion gap: 17 — ABNORMAL HIGH (ref 5–15)
BUN: 18 mg/dL (ref 8–23)
CO2: 17 mmol/L — ABNORMAL LOW (ref 22–32)
Calcium: 8.5 mg/dL — ABNORMAL LOW (ref 8.9–10.3)
Chloride: 105 mmol/L (ref 98–111)
Creatinine, Ser: 1.13 mg/dL (ref 0.61–1.24)
GFR, Estimated: >60 mL/min (ref >60)
Glucose, Bld: 155 mg/dL — ABNORMAL HIGH (ref 70–99)
Potassium: 3.9 mmol/L (ref 3.5–5.1)
Sodium: 139 mmol/L (ref 135–145)

## 2020-03-05 LAB — CBC
HCT: 41.2 % (ref 39.0–52.0)
Hemoglobin: 14 g/dL (ref 13.0–17.0)
MCH: 32.9 pg (ref 26.0–34.0)
MCHC: 34 g/dL (ref 30.0–36.0)
MCV: 96.9 fL (ref 80.0–100.0)
Platelets: 83 10*3/uL — ABNORMAL LOW (ref 150–400)
RBC: 4.25 MIL/uL (ref 4.22–5.81)
RDW: 14.3 % (ref 11.5–15.5)
WBC: 13 10*3/uL — ABNORMAL HIGH (ref 4.0–10.5)
nRBC: 0 % (ref 0.0–0.2)

## 2020-03-05 MED ORDER — METOPROLOL TARTRATE 50 MG PO TABS
50.0000 mg | ORAL_TABLET | Freq: Two times a day (BID) | ORAL | Status: DC
  Administered 2020-03-05 (×2): 50 mg via ORAL
  Filled 2020-03-05: qty 1

## 2020-03-05 MED ORDER — APIXABAN 5 MG PO TABS
5.0000 mg | ORAL_TABLET | Freq: Two times a day (BID) | ORAL | Status: DC
  Administered 2020-03-05 – 2020-03-06 (×2): 5 mg via ORAL
  Filled 2020-03-05 (×2): qty 1

## 2020-03-05 MED ORDER — DILTIAZEM HCL-DEXTROSE 125-5 MG/125ML-% IV SOLN (PREMIX)
5.0000 mg/h | INTRAVENOUS | Status: DC
  Filled 2020-03-05: qty 125

## 2020-03-05 MED ORDER — DILTIAZEM LOAD VIA INFUSION
15.0000 mg | Freq: Once | INTRAVENOUS | Status: DC
  Filled 2020-03-05: qty 15

## 2020-03-05 MED ORDER — DILTIAZEM LOAD VIA INFUSION
10.0000 mg | Freq: Once | INTRAVENOUS | Status: AC
  Administered 2020-03-05: 10 mg via INTRAVENOUS
  Filled 2020-03-05: qty 10

## 2020-03-05 MED ORDER — DILTIAZEM HCL-DEXTROSE 125-5 MG/125ML-% IV SOLN (PREMIX)
5.0000 mg/h | INTRAVENOUS | Status: DC
  Administered 2020-03-05: 5 mg/h via INTRAVENOUS
  Filled 2020-03-05: qty 125

## 2020-03-05 NOTE — Progress Notes (Signed)
ANTICOAGULATION CONSULT NOTE - Initial Consult  Pharmacy Consult for apixaban Indication: atrial fibrillation  Allergies  Allergen Reactions  . Vicodin [Hydrocodone-Acetaminophen] Nausea And Vomiting    Patient Measurements: Height: 5\' 6"  (167.6 cm) Weight: 83.9 kg (185 lb) IBW/kg (Calculated) : 63.8   Vital Signs: Temp: 98.1 F (36.7 C) (12/08 1200) Temp Source: Oral (12/08 1200) BP: 125/75 (12/08 1200) Pulse Rate: 72 (12/08 1200)  Labs: Recent Labs    03/05/20 0157  HGB 14.0  HCT 41.2  PLT 83*  CREATININE 1.13    Estimated Creatinine Clearance: 67.1 mL/min (by C-G formula based on SCr of 1.13 mg/dL).   Medical History: Past Medical History:  Diagnosis Date  . AKI (acute kidney injury) ([place]) 12/2015  . Anxiety   . Arthritis   . Carotid artery stenosis   . Coronary artery disease   . Dementia ([place])    problems with short term memory  . Depression   . Dysrhythmia    episode of afib 12/2005 in setting of cocaine use  . Gout   . Hyperlipidemia   . Hypertension   . Myocardial infarction ([place])    2017  . PAD (peripheral artery disease) ([place])   . TIA (transient ischemic attack)    over 20 years ago    Medications:  Medications Prior to Admission  Medication Sig Dispense Refill Last Dose  . allopurinol (ZYLOPRIM) 100 MG tablet Take 100 mg by mouth daily.   03/04/2020 at 0450  . amLODipine (NORVASC) 10 MG tablet Take 10 mg by mouth daily.    03/04/2020 at 0450  . aspirin 81 MG chewable tablet Chew 1 tablet (81 mg total) by mouth daily. 30 tablet 1 03/03/2020 at Unknown time  . atorvastatin (LIPITOR) 80 MG tablet TAKE 1 TABLET BY MOUTH DAILY (Patient taking differently: Take 80 mg by mouth every evening. ) 90 tablet 3   . BRILINTA 60 MG TABS tablet TAKE 1 TABLET BY MOUTH TWICE DAILY (Patient taking differently: Take 60 mg by mouth daily. ) 180 tablet 3   . escitalopram (LEXAPRO) 20 MG tablet Take 20 mg by mouth daily.   03/04/2020 at 0450  . hydrOXYzine  (ATARAX/VISTARIL) 25 MG tablet Take 25 mg by mouth at bedtime.    03/03/2020 at Unknown time  . losartan-hydrochlorothiazide (HYZAAR) 100-12.5 MG tablet Take 1 tablet by mouth daily.    03/03/2020 at Unknown time  . metoprolol tartrate (LOPRESSOR) 100 MG tablet TAKE 1/2 TABLET(50 MG) BY MOUTH DAILY (Patient taking differently: Take 50 mg by mouth daily. ) 30 tablet 1   . traZODone (DESYREL) 100 MG tablet Take 100 mg by mouth at bedtime.   03/03/2020 at Unknown time  . VASCEPA 1 g capsule TAKE 2 CAPSULES(2 GRAMS) BY MOUTH TWICE DAILY (Patient taking differently: Take 2 g by mouth 2 (two) times daily. ) 120 capsule 3 03/04/2020 at 0450  . nitroGLYCERIN (NITROSTAT) 0.4 MG SL tablet Place 1 tablet (0.4 mg total) under the tongue every 5 (five) minutes as needed. For chest pain (Patient taking differently: Place 0.4 mg under the tongue every 5 (five) minutes as needed for chest pain. ) 30 tablet 0 More than a month at Unknown time   Scheduled:  . allopurinol  100 mg Oral Daily  . amLODipine  10 mg Oral Daily  . aspirin  81 mg Oral Daily  . atorvastatin  80 mg Oral QPM  . docusate sodium  100 mg Oral Daily  . escitalopram  20 mg Oral Daily  .  hydrochlorothiazide  12.5 mg Oral Daily  . hydrOXYzine  25 mg Oral QHS  . icosapent Ethyl  2 g Oral BID  . losartan  100 mg Oral Daily  . metoprolol tartrate  50 mg Oral BID  . pantoprazole  40 mg Oral Daily  . traZODone  100 mg Oral QHS    Assessment: 64 yo male s/p L CEA and noted with afib. Pharmacy consulted to dose apixaban. He was on brilinta 60mg  po bid at home and plans are to discontinue this per cardiology -hg= 14, plt= 83 (history of thrombocytopenia) -SCr= 1.1 -His copay is $4.   Goal of Therapy:  Monitor platelets by anticoagulation protocol: Yes   Plan:  -Apixaban 5mg  po bid -Will provide medication education  Hildred Laser, PharmD Clinical Pharmacist **Pharmacist phone directory can now be found on [email or web address] (PW [email or web address]).  Listed under [place].

## 2020-03-05 NOTE — Discharge Summary (Signed)
Discharge Summary     ASH MCELWAIN 1955/12/07 64 y.o. male  [id number]  Admission Date: 03/04/2020  Discharge Date: 03/06/2020  Physician: Rosetta Posner, MD  Admission Diagnosis: Asymptomatic carotid artery stenosis without infarction, left [I65.22]   HPI:   This is a 64 y.o. male here today for evaluation. He was found to have severe bilateral carotid disease at [facility] cardiovascular duplex. Is here for further discussion. He reports having episodes in the past with neurologic changes but specifically denies any focal neurologic deficits. He does have history of coronary artery disease with history of myocardial infarction in the past  Hospital Course:  The patient was admitted to the hospital and taken to the operating room on 03/04/2020 and underwent left carotid endarterectomy.    The pt tolerated the procedure well and was transported to the PACU in good condition.   By POD 1, the pt neuro status was in tact.  The previous evening, he had gone into afib.  He was started on cardizem gtt.  He is followed by Dr. Einar Gip and he was consulted.  Pt had been on lopressor 50mg  daily at home and has been restarted on lopressor 50mg  bid.  He is also started on Eliquis.  He was kept overnight per cardiology.  On POD 2, pt doing well.  Cardiology increased lopressor to 50mg  tid and felt it was safe for pt to discharge home with f/u with Dr. Einar Gip in 4 weeks.  Pt is discharged home.    Recent Labs    03/05/20 0157  NA 139  K 3.9  CL 105  CO2 17*  GLUCOSE 155*  BUN 18  CALCIUM 8.5*   Recent Labs    03/05/20 0157 03/06/20 0122  WBC 13.0* 14.7*  HGB 14.0 14.2  HCT 41.2 39.6  PLT 83* 150   No results for input(s): INR in the last 72 hours.   Discharge Instructions    Discharge patient   Complete by: As directed    Discharge once pt has meds from transition pharmacy   Discharge disposition: 01-Home or Self Care   Discharge patient date: 03/05/2020       Discharge Diagnosis:  Asymptomatic carotid artery stenosis without infarction, left [I65.22]  Secondary Diagnosis: Patient Active Problem List   Diagnosis Date Noted  . Asymptomatic carotid artery stenosis without infarction, left 03/04/2020  . Elevated LFTs   . Abnormal liver function   . Rectal mass   . Blood in stool   . Anuria   . Acute renal failure (ARF) ([place]) 01/07/2016  . Hypokalemia 01/07/2016  . Hypotension 01/07/2016  . Hypothermia 01/07/2016  . 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 History of non-ST elevation myocardial infarction (NSTEMI) 01/07/2016  . Tobacco use 01/07/2016  . Diarrhea 01/07/2016  . Dyslipidemia 01/07/2016  . Uremic acidosis   . NSTEMI (non-ST elevated myocardial infarction) ([place]) 12/18/2015   Past Medical History:  Diagnosis Date  . AKI (acute kidney injury) ([place]) 12/2015  . Anxiety   . Arthritis   . Carotid artery stenosis   . Coronary artery disease   . Dementia ([place])    problems with short term memory  . Depression   . Dysrhythmia    episode of afib 12/2005 in setting of cocaine use  . Gout   . Hyperlipidemia   . Hypertension   . Myocardial infarction ([place])    2017  . PAD (peripheral artery disease) ([place])   . TIA (transient ischemic attack)    over 20  years ago    Allergies as of 03/06/2020      Reactions   Vicodin [hydrocodone-acetaminophen] Nausea And Vomiting      Medication List    STOP taking these medications   Brilinta 60 MG Tabs tablet Generic drug: ticagrelor     TAKE these medications   allopurinol 100 MG tablet Commonly known as: ZYLOPRIM Take 100 mg by mouth daily.   amLODipine 10 MG tablet Commonly known as: NORVASC Take 10 mg by mouth daily.   apixaban 5 MG Tabs tablet Commonly known as: ELIQUIS Take 1 tablet (5 mg total) by mouth 2 (two) times daily.   aspirin 81 MG chewable tablet Chew 1 tablet (81 mg total) by mouth daily.   atorvastatin 80 MG tablet Commonly known as: LIPITOR TAKE 1  TABLET BY MOUTH DAILY What changed: when to take this   escitalopram 20 MG tablet Commonly known as: LEXAPRO Take 20 mg by mouth daily.   hydrOXYzine 25 MG tablet Commonly known as: ATARAX/VISTARIL Take 25 mg by mouth at bedtime.   losartan-hydrochlorothiazide 100-12.5 MG tablet Commonly known as: HYZAAR Take 1 tablet by mouth daily.   metoprolol tartrate 50 MG tablet Commonly known as: LOPRESSOR Take 1 tablet (50 mg total) by mouth 3 (three) times daily. What changed:   medication strength  See the new instructions.   nitroGLYCERIN 0.4 MG SL tablet Commonly known as: NITROSTAT Place 1 tablet (0.4 mg total) under the tongue every 5 (five) minutes as needed. For chest pain What changed:   reasons to take this  additional instructions   oxyCODONE-acetaminophen 5-325 MG tablet Commonly known as: Percocet Take 1 tablet by mouth every 6 (six) hours as needed.   traZODone 100 MG tablet Commonly known as: DESYREL Take 100 mg by mouth at bedtime.   Vascepa 1 g capsule Generic drug: icosapent Ethyl TAKE 2 CAPSULES(2 GRAMS) BY MOUTH TWICE DAILY What changed: See the new instructions.        [facility] Discharge Instructions Carotid Endarterectomy (CEA)  Please refer to the following instructions for your post-procedure care. Your surgeon or physician assistant will discuss any changes with you.  Activity  You are encouraged to walk as much as you can. You can slowly return to normal activities but must avoid strenuous activity and heavy lifting until your doctor tell you it's OK. Avoid activities such as vacuuming or swinging a golf club. You can drive after one week if you are comfortable and you are no longer taking prescription pain medications. It is normal to feel tired for serval weeks after your surgery. It is also normal to have difficulty with sleep habits, eating, and bowel movements after surgery. These will go away with  time.  Bathing/Showering  You may shower after you come home. Do not soak in a bathtub, hot tub, or swim until the incision heals completely.  Incision Care  Shower every day. Clean your incision with mild soap and water. Pat the area dry with a clean towel. You do not need a bandage unless otherwise instructed. Do not apply any ointments or creams to your incision. You may have skin glue on your incision. Do not peel it off. It will come off on its own in about one week. Your incision may feel thickened and raised for several weeks after your surgery. This is normal and the skin will soften over time. For Men Only: It's OK to shave around the incision but do not shave the  incision itself for 2 weeks. It is common to have numbness under your chin that could last for several months.  Diet  Resume your normal diet. There are no special food restrictions following this procedure. A low fat/low cholesterol diet is recommended for all patients with vascular disease. In order to heal from your surgery, it is CRITICAL to get adequate nutrition. Your body requires vitamins, minerals, and protein. Vegetables are the best source of vitamins and minerals. Vegetables also provide the perfect balance of protein. Processed food has little nutritional value, so try to avoid this.  Medications  Resume taking all of your medications unless your doctor or physician assistant tells you not to.  If your incision is causing pain, you may take over-the- counter pain relievers such as acetaminophen (Tylenol). If you were prescribed a stronger pain medication, please be aware these medications can cause nausea and constipation.  Prevent nausea by taking the medication with a snack or meal. Avoid constipation by drinking plenty of fluids and eating foods with a high amount of fiber, such as fruits, vegetables, and grains.  Do not take Tylenol if you are taking prescription pain medications.  Follow Up  Our office will  schedule a follow up appointment 2-3 weeks following discharge.  Please call us immediately for any of the following conditions  . Increased pain, redness, drainage (pus) from your incision site. . Fever of 101 degrees or higher. . If you should develop stroke (slurred speech, difficulty swallowing, weakness on one side of your body, loss of vision) you should call 911 and go to the nearest emergency room. .  Reduce your risk of vascular disease:  . Stop smoking. If you would like help call [facility] at 1-800-QUIT-NOW 405-552-6367) or [place] at (419) 094-7865. . Manage your cholesterol . Maintain a desired weight . Control your diabetes . Keep your blood pressure down .  If you have any questions, please call the office at [phone].  Prescriptions given: 1.  Roxicet #4 No Refill 2.  Eliquis 5mg  bid #60 one refill (refill per cards) 3.  Lopressor 50mg  tid #90 one refill (refills per cards)  Disposition: home  Patient's condition: is Good  Follow up: 1. Dr. Donnetta Hutching in 2-3 weeks in [place] 2. Dr. Einar Gip in 4 weeks.   Leontine Locket, PA-C [facility] [phone]   --- For [facility] use ---   Modified Rankin score at D/C (0-6): 0  IV medication needed for:  1. Hypertension: No 2. Hypotension: No  Post-op Complications: Yes- new afib  1. Post-op CVA or TIA: No  If yes: Event classification (right eye, left eye, right cortical, left cortical, verterobasilar, other): n/a  If yes: Timing of event (intra-op, <6 hrs post-op, >=6 hrs post-op, unknown): n/a  2. CN injury: No  If yes: CN n/a injuried   3. Myocardial infarction: No  If yes: Dx by (EKG or clinical, Troponin): n/a  4.  CHF: No  5.  Dysrhythmia (new): No  6. Wound infection: No  7. Reperfusion symptoms: No  8. Return to OR: No  If yes: return to OR for (bleeding, neurologic, other CEA incision, other): n/a  Discharge medications: Statin use:  Yes ASA use:  Yes    Beta blocker use:  Yes ACE-Inhibitor use:  No  ARB use:  Yes CCB use: Yes P2Y12 Antagonist use: No, [ ]  Plavix, [ ]  Plasugrel, [ ]  Ticlopinine, [ ]  Ticagrelor, [ ]  Other, [ ]  No for medical reason, [ ]   Non-compliant, [ ]  Not-indicated Anti-coagulant use:  Yes, Eliquis

## 2020-03-05 NOTE — TOC Benefit Eligibility Note (Signed)
Transition of Care [facility]) Benefit Eligibility Note    Patient Details  Name: [name] MRN: [id number] Date of Birth: [birth year]   Medication/Dose: Arne Cleveland  5 MG  Covered?: Yes  Tier: 3 Drug  Prescription Coverage Preferred Pharmacy: Roseanne Kaufman with Person/Company/Phone Number:: FMZU  @  [facility]  RX # [phone] OPT- MEMBER  Co-Pay: $4.00  Prior Approval: No  Deductible: Unmet       Memory Argue Phone Number: 03/05/2020, 5:53 PM

## 2020-03-05 NOTE — Plan of Care (Signed)
  Problem: Activity: Goal: Risk for activity intolerance will decrease Outcome: Progressing   

## 2020-03-05 NOTE — Discharge Instructions (Signed)
 [facility]  Discharge Instructions   Carotid Surgery  Please refer to the following instructions for your post-procedure care. Your surgeon or physician assistant will discuss any changes with you.  Activity  You are encouraged to walk as much as you can. You can slowly return to normal activities but must avoid strenuous activity and heavy lifting until your doctor tell you it's okay. Avoid activities such as vacuuming or swinging a golf club. You can drive after one week if you are comfortable and you are no longer taking prescription pain medications. It is normal to feel tired for serval weeks after your surgery. It is also normal to have difficulty with sleep habits, eating, and bowel movements after surgery. These will go away with time.  Bathing/Showering  Shower daily after you go home. Do not soak in a bathtub, hot tub, or swim until the incision heals completely.  Incision Care  Shower every day. Clean your incision with mild soap and water. Pat the area dry with a clean towel. You do not need a bandage unless otherwise instructed. Do not apply any ointments or creams to your incision. You may have skin glue on your incision. Do not peel it off. It will come off on its own in about one week. Your incision may feel thickened and raised for several weeks after your surgery. This is normal and the skin will soften over time.   For Men Only: It's okay to shave around the incision but do not shave the incision itself for 2 weeks. It is common to have numbness under your chin that could last for several months.  Diet  Resume your normal diet. There are no special food restrictions following this procedure. A low fat/low cholesterol diet is recommended for all patients with vascular disease. In order to heal from your surgery, it is CRITICAL to get adequate nutrition. Your body requires vitamins, minerals, and protein. Vegetables are the best source of  vitamins and minerals. Vegetables also provide the perfect balance of protein. Processed food has little nutritional value, so try to avoid this.  Medications  Resume taking all of your medications unless your doctor or physician assistant tells you not to. If your incision is causing pain, you may take over-the- counter pain relievers such as acetaminophen (Tylenol). If you were prescribed a stronger pain medication, please be aware these medications can cause nausea and constipation. Prevent nausea by taking the medication with a snack or meal. Avoid constipation by drinking plenty of fluids and eating foods with a high amount of fiber, such as fruits, vegetables, and grains.  Do not take Tylenol if you are taking prescription pain medications.  Follow Up  Our office will schedule a follow up appointment 2-3 weeks following discharge.  Please call us immediately for any of the following conditions  Increased pain, redness, drainage (pus) from your incision site. Fever of 101 degrees or higher. If you should develop stroke (slurred speech, difficulty swallowing, weakness on one side of your body, loss of vision) you should call 911 and go to the nearest emergency room.  Reduce your risk of vascular disease:  Stop smoking. If you would like help call [facility] at 1-800-QUIT-NOW (1-800-784-8669) or [facility] at 336-586-4000. Manage your cholesterol Maintain a desired weight Control your diabetes Keep your blood pressure down  If you have any questions, please call the office at [phone].   Information on my medicine - ELIQUIS (apixaban)  Why was Eliquis   prescribed for you? Eliquis was prescribed for you to reduce the risk of a blood clot forming that can cause a stroke if you have a medical condition called atrial fibrillation (a type of irregular heartbeat).  What do You need to know about Eliquis ? Take your Eliquis TWICE DAILY - one tablet in the morning and one tablet  in the evening with or without food. If you have difficulty swallowing the tablet whole please discuss with your pharmacist how to take the medication safely.  Take Eliquis exactly as prescribed by your doctor and DO NOT stop taking Eliquis without talking to the doctor who prescribed the medication.  Stopping may increase your risk of developing a stroke.  Refill your prescription before you run out.  After discharge, you should have regular check-up appointments with your healthcare provider that is prescribing your Eliquis.  In the future your dose may need to be changed if your kidney function or weight changes by a significant amount or as you get older.  What do you do if you miss a dose? If you miss a dose, take it as soon as you remember on the same day and resume taking twice daily.  Do not take more than one dose of ELIQUIS at the same time to make up a missed dose.  Important Safety Information A possible side effect of Eliquis is bleeding. You should call your healthcare provider right away if you experience any of the following: Bleeding from an injury or your nose that does not stop. Unusual colored urine (red or dark brown) or unusual colored stools (red or Paz). Unusual bruising for unknown reasons. A serious fall or if you hit your head (even if there is no bleeding).  Some medicines may interact with Eliquis and might increase your risk of bleeding or clotting while on Eliquis. To help avoid this, consult your healthcare provider or pharmacist prior to using any new prescription or non-prescription medications, including herbals, vitamins, non-steroidal anti-inflammatory drugs (NSAIDs) and supplements.  This website has more information on Eliquis (apixaban): http://www.eliquis.com/eliquis/home  

## 2020-03-05 NOTE — Progress Notes (Addendum)
  Progress Note    03/05/2020 7:07 AM 1 Day Post-Op  Subjective:  "I feel great".  Denies chest pain or shortness of breath or pain.  Has not been out of bed.  No difficulty swallowing.  Pain med made him sick last night.  Afebrile HR 50's-90's SB/afib 161'W-960'A systolic 54% RA  Gtts:  cardizem  Vitals:   03/05/20 0300 03/05/20 0400  BP:  133/71  Pulse:  95  Resp: 20 18  Temp:  98 F (36.7 C)  SpO2:  92%     Physical Exam: Neuro:  In tact; moving all extremities equally; tongue is midline Lungs:  Non labored Incision:  Clean and dry  CBC    Component Value Date/Time   WBC 13.0 (H) 03/05/2020 0157   RBC 4.25 03/05/2020 0157   HGB 14.0 03/05/2020 0157   HCT 41.2 03/05/2020 0157   PLT 83 (L) 03/05/2020 0157   MCV 96.9 03/05/2020 0157   MCH 32.9 03/05/2020 0157   MCHC 34.0 03/05/2020 0157   RDW 14.3 03/05/2020 0157   LYMPHSABS 3.2 03/31/2016 2038   MONOABS 0.8 03/31/2016 2038   EOSABS 0.2 03/31/2016 2038   BASOSABS 0.0 03/31/2016 2038    BMET    Component Value Date/Time   NA 139 03/05/2020 0157   NA 141 07/11/2019 0812   K 3.9 03/05/2020 0157   CL 105 03/05/2020 0157   CO2 17 (L) 03/05/2020 0157   GLUCOSE 155 (H) 03/05/2020 0157   BUN 18 03/05/2020 0157   BUN 15 07/11/2019 0812   CREATININE 1.13 03/05/2020 0157   CALCIUM 8.5 (L) 03/05/2020 0157   GFRNONAA >60 03/05/2020 0157   GFRAA 87 07/11/2019 0812     Intake/Output Summary (Last 24 hours) at 03/05/2020 0707 Last data filed at 03/05/2020 0636 Gross per 24 hour  Intake 2086.67 ml  Output 2075 ml  Net 11.67 ml     Assessment/Plan:  This is a 64 y.o. male who is s/p left CEA  1 Day Post-Op  -pt is doing well this am. -pt neuro exam is in tact -pt in afib last evening and remains in afib - will contact Dr. Einar Gip this morning for cardiac evaluation -pt has not ambulated -pt has voided -DVT prophylaxis:  SCD's -f/u with Dr. Donnetta Hutching in 2 weeks in [facility], PA-C [facility] [phone]  I have examined the patient, reviewed and agree with above.  Doing well overall.  I did discuss with the patient we will get Dr. Einar Gip or his partners to see for recommendations regarding new onset A. fib.  Stable from a carotid standpoint [name], MD 03/05/2020 12:56 PM

## 2020-03-05 NOTE — Progress Notes (Signed)
Stopped Cardizem drip per cardiologist order. Will continue to monitor the pt.  Lavenia Atlas, RN

## 2020-03-05 NOTE — Consult Note (Signed)
CARDIOLOGY CONSULT NOTE  Patient ID: [name] MRN: [id number] DOB/AGE: [id number] 65 y.o.  Admit date: 03/04/2020 Attending physician: Rosetta Posner, MD Primary Physician:  [name], MD Outpatient Cardiologist: Dr. Adrian Prows Inpatient Cardiologist: Rex Kras, DO, [facility]  Chief complaint: elective left carotid endarterectomy surgery Reason of consultation: Atrial Fibrillation.   HPI:  [name] is a 64 y.o. 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  Patient was noted to have bilateral carotid artery stenosis and was referred to vascular surgery for further evaluation and management.  Shared decision was to proceed with left carotid endarterectomy which he underwent yesterday on March 04, 2020.  Postoperatively patient was found to be in atrial fibrillation and therefore cardiology consultation was requested for further evaluation and management.  Currently patient is asymptomatic sitting upright eating breakfast at the time of evaluation.  This is new diagnosis for him according to him.  Patient's ventricular rate has improved as he is currently on Cardizem drip.  He is currently not on anticoagulation most likely secondary to recent surgery.  ALLERGIES: Allergies  Allergen Reactions  . Vicodin [Hydrocodone-Acetaminophen] Nausea And Vomiting    PAST MEDICAL HISTORY: Past Medical History:  Diagnosis Date  . AKI (acute kidney injury) ([place])  12/2015  . Anxiety   . Arthritis   . Carotid artery stenosis   . Coronary artery disease   . Dementia ([place])    problems with short term memory  . Depression   . Dysrhythmia    episode of afib 12/2005 in setting of cocaine use  . Gout   . Hyperlipidemia   . Hypertension   . Myocardial infarction ([place])    2017  . PAD (peripheral artery disease) ([place])   . TIA (transient ischemic attack)    over 20 years ago    PAST SURGICAL HISTORY: Past Surgical History:  Procedure Laterality Date  . CARDIAC CATHETERIZATION N/A 12/18/2015   Procedure: Left Heart Cath and Coronary Angiography;  Surgeon: Adrian Prows, MD;  Location: [facility];  Service: Cardiovascular;  Laterality: N/A;  . CARDIAC CATHETERIZATION N/A 12/18/2015   Procedure: Coronary Stent Intervention;  Surgeon: Adrian Prows, MD;  Location: [facility];  Service: Cardiovascular;  Laterality: N/A;  . CARDIAC CATHETERIZATION N/A 12/18/2015   Procedure: Coronary Balloon Angioplasty;  Surgeon: Adrian Prows, MD;  Location: [facility];  Service: Cardiovascular;  Laterality: N/A;  . COLONOSCOPY N/A 01/12/2016   Procedure: COLONOSCOPY;  Surgeon: Manus Gunning, MD;  Location: [facility];  Service: Gastroenterology;  Laterality: N/A;  . HERNIA REPAIR    . PERIPHERAL VASCULAR CATHETERIZATION N/A 12/18/2015   Procedure: Abdominal Aortogram;  Surgeon: Adrian Prows, MD;  Location: [place] [facility];  Service: Cardiovascular;  Laterality: N/A;    FAMILY HISTORY: The patient family history includes Heart attack in his father and mother; IgA nephropathy in his son.   SOCIAL HISTORY:  The patient  reports that he has been smoking cigarettes. He has a 10.50 pack-year smoking history. He has never used smokeless tobacco. He  reports previous alcohol use. He reports current drug use. Drug: Marijuana.  MEDICATIONS: Current Outpatient Medications  Medication Instructions  . allopurinol (ZYLOPRIM) 100 mg, Oral, Daily  .  amLODipine (NORVASC) 10 mg, Oral, Daily  . aspirin 81 mg, Oral, Daily  . atorvastatin (LIPITOR) 80 MG tablet TAKE 1 TABLET BY MOUTH DAILY  . BRILINTA 60 MG TABS tablet TAKE 1 TABLET BY MOUTH TWICE DAILY  . escitalopram (LEXAPRO) 20 mg, Oral, Daily  . hydrOXYzine (ATARAX/VISTARIL) 25 mg, Oral, Daily at bedtime  . losartan-hydrochlorothiazide (HYZAAR) 100-12.5 MG tablet 1 tablet, Oral, Daily  . metoprolol tartrate (LOPRESSOR) 100 MG tablet TAKE 1/2 TABLET(50 MG) BY MOUTH DAILY  . nitroGLYCERIN (NITROSTAT) 0.4 mg, Sublingual, Every 5 min PRN, For chest pain   . traZODone (DESYREL) 100 mg, Oral, Daily at bedtime  . VASCEPA 1 g capsule TAKE 2 CAPSULES(2 GRAMS) BY MOUTH TWICE DAILY    Review of Systems  Constitutional: Negative for chills and fever.  HENT: Negative for hoarse voice and nosebleeds.   Eyes: Negative for discharge, double vision and pain.  Cardiovascular: Negative for chest pain, claudication, dyspnea on exertion, leg swelling, near-syncope, orthopnea, palpitations, paroxysmal nocturnal dyspnea and syncope.  Respiratory: Negative for hemoptysis and shortness of breath.   Musculoskeletal: Negative for muscle cramps and myalgias.  Gastrointestinal: Negative for abdominal pain, constipation, diarrhea, hematemesis, hematochezia, melena, nausea and vomiting.  Neurological: Negative for dizziness and light-headedness.  All other systems reviewed and are negative.   PHYSICAL EXAM: Vitals with BMI 03/05/2020 03/05/2020 03/05/2020  Height - - -  Weight - - -  BMI - - -  Systolic 115 726 -  Diastolic 85 71 -  Pulse 81 95 78     Intake/Output Summary (Last 24 hours) at 03/05/2020 0904 Last data filed at 03/05/2020 0636 Gross per 24 hour  Intake 1986.67 ml  Output 2075 ml  Net -88.33 ml    CONSTITUTIONAL: Well-developed and well-nourished. No acute distress.  SKIN: Skin is warm and dry. No rash noted. No cyanosis. No pallor. No jaundice HEAD: Normocephalic and atraumatic.  EYES:  No scleral icterus MOUTH/THROAT: Moist oral membranes.  NECK: No JVD present. No thyromegaly noted.  Left carotid endarterectomy site is healing well. LYMPHATIC: No visible cervical adenopathy.  CHEST Normal respiratory effort. No intercostal retractions  LUNGS: Clear to auscultation bilaterally.  No stridor. No wheezes. No rales.  CARDIOVASCULAR: Irregularly irregular, variable O0-B5, soft holosystolic murmur heard at the apex, no gallops or rubs ABDOMINAL: Soft, nontender, nondistended, positive bowel sounds all 4 quadrants, no apparent ascites.  EXTREMITIES: No peripheral edema, SCDs present HEMATOLOGIC: No significant bruising NEUROLOGIC: Oriented to person, place, and time. Nonfocal. Normal muscle tone.  PSYCHIATRIC: Normal mood and affect. Normal behavior. Cooperative  LABORATORY DATA: Lab Results  Component Value Date   WBC 13.0 (H) 03/05/2020   HGB 14.0 03/05/2020   HCT 41.2 03/05/2020   MCV 96.9 03/05/2020   PLT 83 (L) 03/05/2020    Recent Labs  Lab 02/27/20 1600 02/27/20 1600 03/05/20 0157  NA 140   < > 139  K 3.6   < > 3.9  CL 104   < > 105  CO2 24   < > 17*  BUN 15   < > 18  CREATININE 1.35*   < > 1.13  CALCIUM 9.0   < > 8.5*  PROT 6.8  --   --   BILITOT 0.8  --   --   ALKPHOS 101  --   --  ALT 51*  --   --   AST 35  --   --   GLUCOSE 110*   < > 155*   < > = values in this interval not displayed.    Lipid Panel     Component Value Date/Time   CHOL 81 (L) 07/17/2019 1304   TRIG 114 07/17/2019 1304   HDL 41 07/17/2019 1304   CHOLHDL 5.2 12/18/2015 1525   VLDL 25 12/18/2015 1525   LDLCALC 19 07/17/2019 1304    BNP (last 3 results) No results for input(s): BNP in the last 8760 hours.  HEMOGLOBIN A1C Lab Results  Component Value Date   HGBA1C 6.2 (H) 07/11/2019   MPG 120 05/02/2008    Cardiac Panel (last 3 results) No results for input(s): CKTOTAL, CKMB, RELINDX in the last 8760 hours.  Invalid input(s): TROPONINHS  Lab Results  Component  Value Date   CKTOTAL 75 03/26/2009   CKMB 1.1 03/26/2009     TSH No results for input(s): TSH in the last 8760 hours.    RADIOLOGY:  CTA head and neck w/ and w/o contrast:  02/11/2020: 1. Atherosclerotic changes at the bilateral carotid bifurcations. Focal area of bandlike stenosis in the right carotid bifurcation resulting in approximately 90% stenosis. 2. Atherosclerotic changes of the left carotid bifurcation resulting in approximately 70% stenosis. 3. No acute intracranial abnormality. Remote infarcts within the bilateral basal ganglia. Chronic microangiopathy. 4. Emphysema and aortic atherosclerosis.  CARDIAC DATABASE: EKG: 03/04/2020: Atrial fibrillation, 110 bpm, rare PVCs, nonspecific T wave abnormality, without underlying injury pattern.  Echocardiogram: 07/18/2019:  Normal LV systolic function with visual EF 50-55%. Left ventricle cavity  is normal in size. Moderate left ventricular hypertrophy. Normal global  wall motion. Doppler evidence of grade II diastolic dysfunction, elevated  LAP.  Left atrial cavity is mildly dilated.  Mild (Grade I) mitral regurgitation.  Mild tricuspid regurgitation.  Mild pulmonic regurgitation.  Prior study dated 02/25/2016 noted LVEF 65%, mild to moderate MR, mild TR.   Heart Catheterization: 12/18/2015: Successful PTCA and stenting of the proximal and mid LAD with 2 overlapping 4.0 x 22 and 3.5 x 12 mm resolute integrity DES, stenosis reduced from 99% to 0%. PTCA and balloon angioplasty of the distal PDA with a 2.5 x 15 mm Emerge with reduction of stenosis from 90% to less than 30%. 1. Severe diffuse coronary artery disease, scattered disease evident throughout the RCA, PDA has tandem 60% and a 90% stenosis. Distal lesion reduced to less than 30% with balloon angioplasty. 2. High-grade complex 99% stenosis of the proximal LAD. Successful angioplasty as noted above. 3. Mild disease in the circumflex with intermediate lesions 60-70% in OM  3 and OM 4 and distal circumflex AV groove branch. 4. Abdominal aortogram: No evidence of abdominal aortic aneurysm. There are 2 renal arteries on the left, one renal artery on the right. A renal artery stent is evident on the right, renal artery is visible and patent, in-stent restenosis is present could not be evaluated due to nonselective nature of abdominal aortogram. Consider other modalities to evaluate renal artery stenosis, selective renal arteriogram was not performed as patient was not consented and also procedure performed through radial access.  Carotid artery duplex 12/28/2019:  Stenosis in the right internal carotid artery (80-99%). Stenosis in the right external carotid artery (>50%).  Stenosis in the left internal carotid artery (80-99%). Stenosis in the left external carotid artery (<50%).  The PSV internal/common carotid artery ratio is consistent with a stenosis  of >70% bilaterally, right 7.89 and left 8.67.  Antegrade right vertebral artery flow. Antegrade left vertebral artery  flow.  Follow up in four months is appropriate if clinically indicated. Peak  velocity on the right was 349/117 cm/s and 334/106 cm/s on the left.  Compared to the study done on 07/18/2019, there is progression of disease  in bilateral internal carotid artery. Previously right ICA stenosis was  50 to 69% and left was >70%. See attached images.    IMPRESSION & RECOMMENDATIONS: [name] is a 64 y.o. 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  Atrial  fibrillation, postop: . I had a long and detailed discussion with the patient regarding the incidence, etiology, pathophysiology, prognosis, and therapeutic options for atrial fibrillation.  Specifically we discussed oral anticoagulation for stroke prevention. . CHA2DS2-VASc SCORE is 4 (hypertension, history of TIA, vascular disease) which correlates to 4 % risk of stroke per year.  . I reviewed with patient the benefits and risks of initiating/continuing anticoagulation rx, and based on afib treatment guidelines, I recommended long-term anticoagulation for stroke prevention.  Patient agreed. . Patient is currently on dual antiplatelet therapy for his underlying CAD.  Spoke to patient's primary cardiologist and the shared decision was to discontinue Brilinta and continue him on aspirin and oral anticoagulation. . Patient would like to be on direct oral anticoagulants as opposed to Coumadin.  Shared decision was to initiate Eliquis when cleared by vascular surgery. . Continue aspirin given his underlying CAD, PAD and carotid artery disease. . Patient is currently on Lopressor 50 mg p.o. daily we will transition to Lopressor 50 mg p.o. twice daily with holding parameters. Marland Kitchen Spoke to the patient's nurse to discontinue Cardizem drip approximately 2 hours after he takes his morning dose of Lopressor. . Check morning EKG . Recommend outpatient follow-up with Dr. Einar Gip in 4 weeks post hospitalization for reevaluation and possibly consider cardioversion if he continues to be in A. Fib. D/C paperwork updated.  Bilateral carotid artery stenosis status post left carotid endarterectomy: Currently managed by vascular surgery. Continue aspirin 81 mg p.o. daily.  Secondary Diagnosis:  Established coronary artery disease with prior coronary interventions without angina pectoris: Continue guideline directed medical therapy. Educated on importance of risk factor modifications.  Mixed hyperlipidemia: Continue  statin therapy.  Active cigarette smoking: Educated importance of complete smoking cessation given the burden of vascular disease involving coronary, peripheral, and carotid circulation.  Peripheral artery disease: Continue current medical therapy. Encouraged continued smoking cessation and marijuana use. Continue risk factor modifications.  History of TIA: Continue aspirin and statin therapy.  Educated on importance of secondary prevention.  Total encounter time 86 minutes. *Total Encounter Time as defined by the Centers for Medicare and Medicaid Services includes, in addition to the face-to-face time of a patient visit (documented in the note above) non-face-to-face time: obtaining and reviewing outside history, ordering and reviewing medications, tests or procedures, care coordination (communications with other health care professionals or caregivers) and documentation in the medical record.  Patient's questions and concerns were addressed to his satisfaction. He voices understanding of the instructions provided during this encounter.   This  note was created using a voice recognition software as a result there may be grammatical errors inadvertently enclosed that do not reflect the nature of this encounter. Every attempt is made to correct such errors.  Mechele Claude [facility]  Pager: [phone] Office: [phone] 03/05/2020, 9:04 AM

## 2020-03-05 NOTE — Progress Notes (Signed)
Patient on atrial fibrillation with the rate of 90s to 110s per min confirmed by 12 leads EKG,B/P is 820 s to 601V systolic pressure.Pt is alert and oriented not on pain.Dr. Donnetta Hutching made aware with orders.Cardizem drip started with 10 mg IV bolus.Will continue to closely monitor the patient.

## 2020-03-06 ENCOUNTER — Other Ambulatory Visit (HOSPITAL_COMMUNITY): Payer: Self-pay | Admitting: Physician Assistant

## 2020-03-06 LAB — CBC
HCT: 39.6 % (ref 39.0–52.0)
Hemoglobin: 14.2 g/dL (ref 13.0–17.0)
MCH: 33.6 pg (ref 26.0–34.0)
MCHC: 35.9 g/dL (ref 30.0–36.0)
MCV: 93.8 fL (ref 80.0–100.0)
Platelets: 150 10*3/uL (ref 150–400)
RBC: 4.22 MIL/uL (ref 4.22–5.81)
RDW: 14.4 % (ref 11.5–15.5)
WBC: 14.7 10*3/uL — ABNORMAL HIGH (ref 4.0–10.5)
nRBC: 0 % (ref 0.0–0.2)

## 2020-03-06 MED ORDER — METOPROLOL TARTRATE 50 MG PO TABS
50.0000 mg | ORAL_TABLET | Freq: Three times a day (TID) | ORAL | 1 refills | Status: DC
Start: 2020-03-06 — End: 2021-03-03

## 2020-03-06 MED ORDER — APIXABAN 5 MG PO TABS
5.0000 mg | ORAL_TABLET | Freq: Two times a day (BID) | ORAL | 2 refills | Status: DC
Start: 2020-03-06 — End: 2020-07-07

## 2020-03-06 MED ORDER — METOPROLOL TARTRATE 50 MG PO TABS
50.0000 mg | ORAL_TABLET | Freq: Three times a day (TID) | ORAL | Status: DC
  Administered 2020-03-06: 50 mg via ORAL
  Filled 2020-03-06: qty 1

## 2020-03-06 MED ORDER — OXYCODONE-ACETAMINOPHEN 5-325 MG PO TABS
1.0000 | ORAL_TABLET | Freq: Four times a day (QID) | ORAL | 0 refills | Status: DC | PRN
Start: 2020-03-06 — End: 2020-03-11

## 2020-03-06 MED FILL — METOPROLOL TARTRATE 50 MG T: 50 | 30 days supply | Qty: 90 | Fill #0

## 2020-03-06 MED FILL — OXYCODONE-APAP 5-325MG: 5-325 | 1 days supply | Qty: 4 | Fill #0

## 2020-03-06 MED FILL — ELIQUIS 5 MG TABLET: 5 | 30 days supply | Qty: 60 | Fill #0

## 2020-03-06 NOTE — Progress Notes (Signed)
Progress Note  Patient Name: [name] Date of Encounter: 03/06/2020  Attending physician: Rosetta Posner, MD Primary care provider: [name], MD Primary Cardiologist: Dr. Adrian Prows, [place], DO, [facility]  Subjective: [name] is a 64 y.o. male who was seen and examined at bedside at approximately 830am No events overnight. Complains of some discomfort at the incision site. Remains in atrial fibrillation but ventricular rate is improved.  Remains asymptomatic. Denies any chest pain, shortness of breath, orthopnea, paroxysmal nocturnal dyspnea or lower extremity swelling.  Objective: Vital Signs in the last 24 hours: Temp:  [97.7 F (36.5 C)-98.4 F (36.9 C)] 97.9 F (36.6 C) (12/09 1201) Pulse Rate:  [61-95] 68 (12/09 1201) Resp:  [17-24] 20 (12/09 0900) BP: (114-187)/(58-111) 135/96 (12/09 1201) SpO2:  [93 %-96 %] 96 % (12/09 1201)  Intake/Output:  Intake/Output Summary (Last 24 hours) at 03/06/2020 1206 Last data filed at 03/06/2020 0808 Gross per 24 hour  Intake 678.42 ml  Output 1650 ml  Net -971.58 ml    Net IO Since Admission: -1,399.91 mL [03/06/20 1206]  Weights:  Filed Weights   03/04/20 0552  Weight: 83.9 kg    Telemetry: Personally reviewed, A. fib with rare PVCs and ventricular run  Physical examination: PHYSICAL EXAM: Vitals with BMI 03/06/2020 03/06/2020 03/06/2020  Height - - -  Weight - - -  BMI - - -  Systolic 161 096 045  Diastolic 96 58 409  Pulse 68 74 90    CONSTITUTIONAL: Well-developed and well-nourished. No acute distress.  SKIN: Skin is warm and dry. No rash noted. No cyanosis. No pallor. No jaundice HEAD: Normocephalic and atraumatic.  EYES: No scleral icterus MOUTH/THROAT: Moist oral membranes.  NECK: No JVD present. No thyromegaly noted.  Left carotid endarterectomy site is healing well. LYMPHATIC: No visible cervical adenopathy.  CHEST Normal respiratory effort. No intercostal retractions  LUNGS:  Clear to auscultation bilaterally.  No stridor. No wheezes. No rales.  CARDIOVASCULAR: Irregularly irregular, variable W1-X9, soft holosystolic murmur heard at the apex, no gallops or rubs ABDOMINAL: Soft, nontender, nondistended, positive bowel sounds all 4 quadrants, no apparent ascites.  EXTREMITIES: No peripheral edema, SCDs present HEMATOLOGIC: No significant bruising NEUROLOGIC: Oriented to person, place, and time. Nonfocal. Normal muscle tone.  PSYCHIATRIC: Normal mood and affect. Normal behavior. Cooperative  Lab Results: Hematology Recent Labs  Lab 03/05/20 0157 03/06/20 0122  WBC 13.0* 14.7*  RBC 4.25 4.22  HGB 14.0 14.2  HCT 41.2 39.6  MCV 96.9 93.8  MCH 32.9 33.6  MCHC 34.0 35.9  RDW 14.3 14.4  PLT 83* 150    Chemistry Recent Labs  Lab 03/05/20 0157  NA 139  K 3.9  CL 105  CO2 17*  GLUCOSE 155*  BUN 18  CREATININE 1.13  CALCIUM 8.5*  GFRNONAA >60  ANIONGAP 17*     Cardiac Enzymes: Cardiac Panel (last 3 results) No results for input(s): CKTOTAL, CKMB, TROPONINIHS, RELINDX in the last 72 hours.  BNP (last 3 results) No results for input(s): BNP in the last 8760 hours.  ProBNP (last 3 results) No results for input(s): PROBNP in the last 8760 hours.   DDimer No results for input(s): DDIMER in the last 168 hours.   Hemoglobin A1c:  Lab Results  Component Value Date   HGBA1C 6.2 (H) 07/11/2019   MPG 120 05/02/2008    TSH No results for input(s): TSH in the last 8760 hours.  Lipid Panel     Component Value Date/Time  CHOL 81 (L) 07/17/2019 1304   TRIG 114 07/17/2019 1304   HDL 41 07/17/2019 1304   CHOLHDL 5.2 12/18/2015 1525   VLDL 25 12/18/2015 1525   LDLCALC 19 07/17/2019 1304   LDLDIRECT 27 07/11/2019 0811   RADIOLOGY:  CTA head and neck w/ and w/o contrast:  02/11/2020: 1. Atherosclerotic changes at the bilateral carotid bifurcations. Focal area of bandlike stenosis in the right carotid bifurcation resulting in approximately 90%  stenosis. 2. Atherosclerotic changes of the left carotid bifurcation resulting in approximately 70% stenosis. 3. No acute intracranial abnormality. Remote infarcts within the bilateral basal ganglia. Chronic microangiopathy. 4. Emphysema and aortic atherosclerosis.  CARDIAC DATABASE: EKG: 03/04/2020: Atrial fibrillation, 110 bpm, rare PVCs, nonspecific T wave abnormality, without underlying injury pattern.  Echocardiogram: 07/18/2019:  Normal LV systolic function with visual EF 50-55%. Left ventricle cavity  is normal in size. Moderate left ventricular hypertrophy. Normal global  wall motion. Doppler evidence of grade II diastolic dysfunction, elevated  LAP.  Left atrial cavity is mildly dilated.  Mild (Grade I) mitral regurgitation.  Mild tricuspid regurgitation.  Mild pulmonic regurgitation.  Prior study dated 02/25/2016 noted LVEF 65%, mild to moderate MR, mild TR.   Heart Catheterization: 12/18/2015: Successful PTCA and stenting of the proximal and mid LAD with 2 overlapping 4.0 x 22 and 3.5 x 12 mm resolute integrity DES, stenosis reduced from 99% to 0%. PTCA and balloon angioplasty of the distal PDA with a 2.5 x 15 mm Emerge with reduction of stenosis from 90% to less than 30%. 1. Severe diffuse coronary artery disease, scattered disease evident throughout the RCA, PDA has tandem 60% and a 90% stenosis. Distal lesion reduced to less than 30% with balloon angioplasty. 2. High-grade complex 99% stenosis of the proximal LAD. Successful angioplasty as noted above. 3. Mild disease in the circumflex with intermediate lesions 60-70% in OM 3 and OM 4 and distal circumflex AV groove branch. 4. Abdominal aortogram: No evidence of abdominal aortic aneurysm. There are 2 renal arteries on the left, one renal artery on the right. A renal artery stent is evident on the right, renal artery is visible and patent, in-stent restenosis is present could not be evaluated due to nonselective nature of  abdominal aortogram. Consider other modalities to evaluate renal artery stenosis, selective renal arteriogram was not performed as patient was not consented and also procedure performed through radial access.  Carotid artery duplex 12/28/2019:  Stenosis in the right internal carotid artery (80-99%). Stenosis in the right external carotid artery (>50%).  Stenosis in the left internal carotid artery (80-99%). Stenosis in the left external carotid artery (<50%).  The PSV internal/common carotid artery ratio is consistent with a stenosis  of >70% bilaterally, right 7.89 and left 8.67.  Antegrade right vertebral artery flow. Antegrade left vertebral artery  flow.  Follow up in four months is appropriate if clinically indicated. Peak  velocity on the right was 349/117 cm/s and 334/106 cm/s on the left.  Compared to the study done on 07/18/2019, there is progression of disease  in bilateral internal carotid artery. Previously right ICA stenosis was  50 to 69% and left was >70%. See attached images.   Scheduled Meds: . allopurinol  100 mg Oral Daily  . amLODipine  10 mg Oral Daily  . apixaban  5 mg Oral BID  . aspirin  81 mg Oral Daily  . atorvastatin  80 mg Oral QPM  . docusate sodium  100 mg Oral Daily  . escitalopram  20  mg Oral Daily  . hydrochlorothiazide  12.5 mg Oral Daily  . hydrOXYzine  25 mg Oral QHS  . icosapent Ethyl  2 g Oral BID  . losartan  100 mg Oral Daily  . metoprolol tartrate  50 mg Oral TID  . pantoprazole  40 mg Oral Daily  . traZODone  100 mg Oral QHS    Continuous Infusions: . sodium chloride    . sodium chloride 100 mL/hr at 03/05/20 0645  . diltiazem (CARDIZEM) infusion Stopped (03/05/20 1420)  . magnesium sulfate bolus IVPB      PRN Meds: sodium chloride, acetaminophen **OR** acetaminophen, alum & mag hydroxide-simeth, bisacodyl, guaiFENesin-dextromethorphan, labetalol, magnesium sulfate bolus IVPB, metoprolol tartrate, morphine injection, nitroGLYCERIN,  ondansetron, oxyCODONE-acetaminophen, phenol, polyethylene glycol, potassium chloride   IMPRESSION & RECOMMENDATIONS: [name] is a 64 y.o. 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  Postop atrial fibrillation: Rate control: Metoprolol. Rhythm control: N/A. Thromboembolic prophylaxis: Eliquis CHA2DS2-VASc SCORE is 4 which correlates to 4% risk of stroke per year (hypertension, history of TIA, vascular disease). Patient's ventricular rate has improved since yesterday and he is no longer on a Cardizem drip.  Overnight his ventricular rate was elevated compared to the daytime as per telemetry. Increase Lopressor to 50 mg p.o. 3 times daily. Started on Eliquis during this hospitalization first dose was given yesterday and he remains hemodynamically stable. Reeducated on the risks, benefits, and alternatives to oral anticoagulation. From a cardiac standpoint patient can be discharged home and will need to follow-up with Dr. Einar Gip in 4 weeks with an EKG on arrival.  If patient continues to be in atrial fibrillation may consider cardioversion at that time.  Bilateral carotid artery stenosis status post left carotid endarterectomy: Currently managed by vascular surgery. Continue aspirin 81 mg p.o. daily and statins.  Secondary Diagnosis:  Established coronary artery disease with prior coronary interventions without angina pectoris: Continue guideline directed medical therapy. Educated on importance of risk factor modifications.  Mixed hyperlipidemia: Continue  statin therapy.  Active cigarette smoking: Educated importance of complete smoking cessation given the burden of vascular disease involving coronary, peripheral, and carotid circulation.  Peripheral artery disease: Continue current medical therapy. Encouraged continued smoking cessation and marijuana use. Continue risk factor modifications.  History of TIA: Continue aspirin and statin therapy.  Educated on importance of secondary prevention.  Patient's questions and concerns were addressed to his satisfaction. He voices understanding of the instructions provided during this encounter.   This note was created using a voice recognition software as a result there may be grammatical errors inadvertently enclosed that do not reflect the nature of this encounter. Every attempt is made to correct such errors.  Total time spent: 30 minutes.  Rex Kras, DO, [facility]. [place] Office: [phone] 03/06/2020, 12:06 PM

## 2020-03-06 NOTE — Progress Notes (Signed)
D/C instructions given to patient. Wound care and medication instructions given. All questions answered. Signs and symptoms and when to return to hospital discussed. Wife and friend to escort pt home.  Clyde Canterbury, RN

## 2020-03-06 NOTE — Progress Notes (Signed)
  Progress Note    03/06/2020 7:23 AM 2 Days Post-Op  Subjective:  Says his neck is a little sore this morning.  Otherwise, feels ok.   Afebrile HR 30's-150's afib 42-706'C systolic 37% RA  Gtts:  None  Vitals:   03/06/20 0300 03/06/20 0318  BP: 123/83 (!) 114/103  Pulse: 70 82  Resp: 20 20  Temp:  98.2 F (36.8 C)  SpO2: 94% 94%     Physical Exam: Neuro:  In tact; tongue is midline Lungs:  Non labored. Incision:  Clean and dry   CBC    Component Value Date/Time   WBC 14.7 (H) 03/06/2020 0122   RBC 4.22 03/06/2020 0122   HGB 14.2 03/06/2020 0122   HCT 39.6 03/06/2020 0122   PLT 150 03/06/2020 0122   MCV 93.8 03/06/2020 0122   MCH 33.6 03/06/2020 0122   MCHC 35.9 03/06/2020 0122   RDW 14.4 03/06/2020 0122   LYMPHSABS 3.2 03/31/2016 2038   MONOABS 0.8 03/31/2016 2038   EOSABS 0.2 03/31/2016 2038   BASOSABS 0.0 03/31/2016 2038    BMET    Component Value Date/Time   NA 139 03/05/2020 0157   NA 141 07/11/2019 0812   K 3.9 03/05/2020 0157   CL 105 03/05/2020 0157   CO2 17 (L) 03/05/2020 0157   GLUCOSE 155 (H) 03/05/2020 0157   BUN 18 03/05/2020 0157   BUN 15 07/11/2019 0812   CREATININE 1.13 03/05/2020 0157   CALCIUM 8.5 (L) 03/05/2020 0157   GFRNONAA >60 03/05/2020 0157   GFRAA 87 07/11/2019 0812     Intake/Output Summary (Last 24 hours) at 03/06/2020 0723 Last data filed at 03/05/2020 2300 Gross per 24 hour  Intake 618.42 ml  Output 1750 ml  Net -1131.58 ml     Assessment/Plan:  This is a 64 y.o. male who is s/p left CEA 2 Days Post-Op  -pt is doing well this am and neuro intact.  He continues to be in afib.  He was not discharged yesterday due to bradycardia.  His HR dipped into the upper 30's last evening and as high as 150.  Cardiology to see pt this am.  Appreciate their assistance.  -pt has ambulated -pt has voided -f/u with Dr. Donnetta Hutching in 2 weeks in [place], [place] [facility] [phone]

## 2020-03-06 NOTE — Plan of Care (Signed)
Plan of care reviewed. Pt's been progressing.  Problem: Clinical Measurements: S/P left carotid endarterectomy Goal: Will remain free from infection Outcome: Progressing: left neck incision is dry and clean, no hematoma, no drainage.   Problem: Clinical Measurements: on room air, SPO2 95% Goal: Respiratory complications will improve Outcome: Progressing: No distress. Lungs clear auscultated.    Problem: Clinical Measurements: post op Atrial fib, HR is under controlled. Goal: Cardiovascular complication will be avoided Outcome: Progressing: He's hemodynamically stable. No distress.   Problem: Activity: ambulated to bathroom with standby assisted. Goal: Risk for activity intolerance will decrease Outcome: Progressing   Problem: Pain Managment: tolerated well. Able to rest well tonight. Goal: General experience of comfort will improve Outcome: Progressing  Problem: Clinical Measurements: Pt presents minor left facial asymmetrically.  It could be from facial nerve manipulated since post surgery. Otherwise, neuro signs intact. Goal: Diagnostic test results will improve Outcome: Progressing     Kennyth Lose, RN

## 2020-03-07 ENCOUNTER — Telehealth: Payer: Self-pay

## 2020-03-07 ENCOUNTER — Other Ambulatory Visit: Payer: Self-pay | Admitting: Vascular Surgery

## 2020-03-07 MED ORDER — OXYCODONE-ACETAMINOPHEN 5-325 MG PO TABS
1.0000 | ORAL_TABLET | ORAL | 0 refills | Status: DC | PRN
Start: 2020-03-07 — End: 2020-03-11

## 2020-03-07 NOTE — Telephone Encounter (Signed)
Patient called with 10/10 pain s/p L carotid on 12/7. Received #4 tablets of oxycodone on hospital d/c. Discussed with Dr. Donzetta Matters - called in #30 to pharmacy. Patient aware.

## 2020-03-11 ENCOUNTER — Telehealth: Payer: Self-pay | Admitting: Pharmacist

## 2020-03-11 NOTE — Telephone Encounter (Signed)
Med list reviewed and updated. Pt recovering well since recent left endarterectomy procedure. Pt was noted to be in Afib post op and was observed to have elevated ventricular rate and metoprolol dose was changed to TID dosing. HR at home have been in the 50s. Reviewed with pt to hold metoprolol if SBP <100. Encouraged pt to increase his fluid intake and to take his time when going from supine/sitting to standing position. Will continue to closely monitor and follow up closely. Has an OV scheduled with Dr. Einar Gip on 04/05/19

## 2020-03-31 ENCOUNTER — Other Ambulatory Visit: Payer: Self-pay

## 2020-03-31 ENCOUNTER — Encounter: Payer: Self-pay | Admitting: Vascular Surgery

## 2020-03-31 ENCOUNTER — Ambulatory Visit (INDEPENDENT_AMBULATORY_CARE_PROVIDER_SITE_OTHER): Payer: Self-pay | Admitting: Vascular Surgery

## 2020-03-31 VITALS — BP 135/93 | HR 64 | Temp 97.5°F | Resp 18 | Ht 66.0 in | Wt 183.0 lb

## 2020-03-31 DIAGNOSIS — I6523 Occlusion and stenosis of bilateral carotid arteries: Secondary | ICD-10-CM

## 2020-03-31 NOTE — Progress Notes (Signed)
   [facility]  Patient name: [name] MRN: [id number] DOB: 26-Oct-1955 Sex: male  REASON FOR VISIT: Follow-up left carotid endarterectomy 03/04/2020  HPI: [name] is a 65 y.o. male here today for follow-up.  He was found to have severe bilateral asymptomatic internal carotid artery stenosis.  I recommend staged endarterectomies.  He is right-handed therefore underwent left carotid endarterectomy on 03/04/2020.  He did well and was discharged home on postoperative day 1.  He is here today for follow-up.  He has had no postoperative difficulties.  Specifically has had no neurologic deficits.  He has had minimal discomfort with his neck incision and has the typical periincisional numbness  Current Outpatient Medications  Medication Sig Dispense Refill  . allopurinol (ZYLOPRIM) 100 MG tablet Take 100 mg by mouth daily.    Marland Kitchen amLODipine (NORVASC) 10 MG tablet Take 10 mg by mouth daily.     Marland Kitchen apixaban (ELIQUIS) 5 MG TABS tablet Take 1 tablet (5 mg total) by mouth 2 (two) times daily. 60 tablet 2  . aspirin 81 MG chewable tablet Chew 1 tablet (81 mg total) by mouth daily. 30 tablet 1  . atorvastatin (LIPITOR) 80 MG tablet TAKE 1 TABLET BY MOUTH DAILY (Patient taking differently: Take 80 mg by mouth every evening.) 90 tablet 3  . colchicine 0.6 MG tablet Take 0.6 mg by mouth daily as needed.    Marland Kitchen escitalopram (LEXAPRO) 20 MG tablet Take 20 mg by mouth daily.    . hydrOXYzine (ATARAX/VISTARIL) 25 MG tablet Take 25 mg by mouth at bedtime.    Marland Kitchen losartan-hydrochlorothiazide (HYZAAR) 100-12.5 MG tablet Take 1 tablet by mouth daily.     . metoprolol tartrate (LOPRESSOR) 50 MG tablet Take 1 tablet (50 mg total) by mouth 3 (three) times daily. (Patient taking differently: Take 50 mg by mouth in the morning, at noon, and at bedtime.) 90 tablet 1  . nitroGLYCERIN (NITROSTAT) 0.4 MG SL tablet Place 1 tablet (0.4 mg total) under the tongue  every 5 (five) minutes as needed. For chest pain (Patient taking differently: Place 0.4 mg under the tongue every 5 (five) minutes as needed for chest pain.) 30 tablet 0  . traZODone (DESYREL) 100 MG tablet Take 100 mg by mouth at bedtime.    Marland Kitchen VASCEPA 1 g capsule TAKE 2 CAPSULES(2 GRAMS) BY MOUTH TWICE DAILY (Patient taking differently: Take 2 g by mouth 2 (two) times daily.) 120 capsule 3   No current facility-administered medications for this visit.     PHYSICAL EXAM: Vitals:   03/31/20 1519  BP: (!) 135/93  Pulse: 64  Resp: 18  Temp: (!) 97.5 F (36.4 C)  TempSrc: Other (Comment)  SpO2: 96%  Weight: 183 lb (83 kg)  Height: 5\' 6"  (1.676 m)    GENERAL: The patient is a well-nourished male, in no acute distress. The vital signs are documented above. Left neck incision well-healed.  No carotid bruits bilaterally Neurologically he is grossly intact  MEDICAL ISSUES: Stable status post left carotid endarterectomy.  We will schedule right carotid endarterectomy for severe asymptomatic disease   , MD FACS [facility] Tel [phone]

## 2020-04-02 ENCOUNTER — Other Ambulatory Visit: Payer: Self-pay

## 2020-04-04 ENCOUNTER — Encounter: Payer: Self-pay | Admitting: Cardiology

## 2020-04-04 ENCOUNTER — Ambulatory Visit: Payer: Medicaid Other | Admitting: Cardiology

## 2020-04-04 ENCOUNTER — Other Ambulatory Visit: Payer: Self-pay

## 2020-04-04 VITALS — BP 140/84 | HR 60 | Resp 16 | Ht 66.0 in | Wt 187.0 lb

## 2020-04-04 DIAGNOSIS — I2510 Atherosclerotic heart disease of native coronary artery without angina pectoris: Secondary | ICD-10-CM

## 2020-04-04 DIAGNOSIS — I10 Essential (primary) hypertension: Secondary | ICD-10-CM

## 2020-04-04 DIAGNOSIS — I4819 Other persistent atrial fibrillation: Secondary | ICD-10-CM

## 2020-04-04 DIAGNOSIS — I6523 Occlusion and stenosis of bilateral carotid arteries: Secondary | ICD-10-CM

## 2020-04-04 DIAGNOSIS — F17200 Nicotine dependence, unspecified, uncomplicated: Secondary | ICD-10-CM

## 2020-04-04 MED ORDER — BUPROPION HCL ER (SR) 150 MG PO TB12: 150.0000 mg | ORAL_TABLET | Freq: Two times a day (BID) | ORAL | 2 refills | Status: DC

## 2020-04-04 NOTE — Progress Notes (Signed)
Primary Physician/Referring:  [name], MD  Patient ID: [name], male    DOB: 1955/04/20, 65 y.o.   MRN: [id number]  Chief Complaint  Patient presents with  . Bradycardia  . Coronary Artery Disease  . PAD  . Carotid Stenosis  . Follow-up    3 month   HPI:    [name]  is a 65 y.o. Caucasian male with history of non-STEMI and angioplasty and stent, peripheral artery disease with interventions to his lower extremity in the remote past in [place], primary hypertension, mixed hyperlipidemia, active smoking, active marijuana use, history of substance abuse remotely, hypertension, history of TIA and excessive alcohol use quit in July 2021, family history of premature coronary artery disease in which his father died at the age of 26 and his mother at age 45 from MI with CABG in her 27s.    Underwent left carotid endarterectomy on 03/04/2020 and was found to have postop atrial fibrillation with RVR.  Now scheduled for right carotid endarterectomy on 04/08/2020.  He is tolerating all his medications well, except for occasional dizziness when he stands up no specific complaints. States that he is presently doing well and denies chest pain or symptoms of claudication.  Dyspnea has remained stable.    Past Medical History:  Diagnosis Date  . AKI (acute kidney injury) ([place]) 12/2015  . Anxiety   . Arthritis   . Carotid artery stenosis   . Coronary artery disease   . Dementia ([place])    problems with short term memory  . Depression   . Dysrhythmia    episode of afib 12/2005 in setting of cocaine use  . Gout   . Hyperlipidemia   . Hypertension   . Myocardial infarction ([place])    2017  . PAD (peripheral artery disease) ([place])   . TIA (transient ischemic attack)    over 20 years ago   Past Surgical History:  Procedure Laterality Date  . CARDIAC CATHETERIZATION N/A 12/18/2015   Procedure: Left Heart Cath and Coronary Angiography;  Surgeon: Adrian Prows, MD;  Location: [place] [facility];  Service: Cardiovascular;  Laterality: N/A;  . CARDIAC CATHETERIZATION N/A 12/18/2015   Procedure: Coronary Stent Intervention;  Surgeon: Adrian Prows, MD;  Location: [place] [facility];  Service: Cardiovascular;  Laterality: N/A;  . CARDIAC CATHETERIZATION N/A 12/18/2015   Procedure: Coronary Balloon Angioplasty;  Surgeon: Adrian Prows, MD;  Location: [place] [facility];  Service: Cardiovascular;  Laterality: N/A;  . COLONOSCOPY N/A 01/12/2016   Procedure: COLONOSCOPY;  Surgeon: Manus Gunning, MD;  Location: [place];  Service: Gastroenterology;  Laterality: N/A;  . ENDARTERECTOMY Left 03/04/2020   Procedure: LEFT ENDARTERECTOMY CAROTID;  Surgeon: Rosetta Posner, MD;  Location: [facility];  Service: Vascular;  Laterality: Left;  . HERNIA REPAIR    . PATCH ANGIOPLASTY Left 03/04/2020   Procedure: PATCH ANGIOPLASTY;  Surgeon: Rosetta Posner, MD;  Location: [facility];  Service: Vascular;  Laterality: Left;  . PERIPHERAL VASCULAR CATHETERIZATION N/A 12/18/2015   Procedure: Abdominal Aortogram;  Surgeon: Adrian Prows, MD;  Location: [place] [facility];  Service: Cardiovascular;  Laterality: N/A;   Family History  Problem Relation Age of Onset  . Heart attack Mother   . Heart attack Father   . IgA nephropathy Son     Social History   Tobacco Use  . Smoking status: Current Some Day Smoker    Packs/day: 0.50    Years: 21.00    Pack  years: 10.50    Types: Cigarettes  . Smokeless tobacco: Never Used  Substance Use Topics  . Alcohol use: Not Currently    Comment: quit drinking 4 months ago   Marital Status: Legally Separated  ROS  Review of Systems  Cardiovascular: Negative for chest pain, dyspnea on exertion and leg swelling.  Gastrointestinal: Negative for melena.   Objective  Blood pressure 140/84, pulse 60, resp. rate 16, height 5\' 6"  (1.676 m), weight 187 lb (84.8 kg), SpO2 98 %.  Vitals with BMI 04/04/2020 03/31/2020 03/06/2020  Height 5\' 6"  5\' 6"  -  Weight 187 lbs 183 lbs -  BMI [id number]  0000000 -  Systolic [id number] [id number]  Diastolic 84 93 94  Pulse 60 64 59     Physical Exam Cardiovascular:     Rate and Rhythm: Normal rate. Rhythm irregular.     Pulses: Intact distal pulses.          Carotid pulses are on the right side with bruit and on the left side with bruit.      Dorsalis pedis pulses are 1+ on the right side and 1+ on the left side.       Posterior tibial pulses are 1+ on the right side and 1+ on the left side.     Heart sounds: No murmur heard. No gallop.      Comments: S1 is irregular, S2 normal sounding.  No leg edema, no JVD. Left carotid endarterectomy scar has healed well. Pulmonary:     Effort: Pulmonary effort is normal.     Breath sounds: Normal breath sounds.  Abdominal:     General: Bowel sounds are normal.     Palpations: Abdomen is soft.    Laboratory examination:   Recent Labs    07/11/19 0811 07/11/19 0812 02/11/20 1550 02/27/20 1600 03/05/20 0157  NA 141 141  --  140 139  K 3.8 3.8  --  3.6 3.9  CL 108* 108*  --  104 105  CO2 20 18*  --  24 17*  GLUCOSE 116* 118*  --  110* 155*  BUN 14 15  --  15 18  CREATININE 1.12 1.04 1.20 1.35* 1.13  CALCIUM 9.0 8.6  --  9.0 8.5*  GFRNONAA 69 76  --  59* >60  GFRAA 80 87  --   --   --    CrCl cannot be calculated (Patient's most recent lab result is older than the maximum 21 days allowed.).  CMP Latest Ref Rng & Units 03/05/2020 02/27/2020 02/11/2020  Glucose 70 - 99 mg/dL 155(H) 110(H) -  BUN 8 - 23 mg/dL 18 15 -  Creatinine 0.61 - 1.24 mg/dL 1.13 1.35(H) 1.20  Sodium 135 - 145 mmol/L 139 140 -  Potassium 3.5 - 5.1 mmol/L 3.9 3.6 -  Chloride 98 - 111 mmol/L 105 104 -  CO2 22 - 32 mmol/L 17(L) 24 -  Calcium 8.9 - 10.3 mg/dL 8.5(L) 9.0 -  Total Protein 6.5 - 8.1 g/dL - 6.8 -  Total Bilirubin 0.3 - 1.2 mg/dL - 0.8 -  Alkaline Phos 38 - 126 U/L - 101 -  AST 15 - 41 U/L - 35 -  ALT 0 - 44 U/L - 51(H) -   CBC Latest Ref Rng & Units 03/06/2020 03/05/2020 02/27/2020  WBC 4.0 - 10.5 K/uL 14.7(H)  13.0(H) 7.3  Hemoglobin 13.0 - 17.0 g/dL 14.2 14.0 13.3  Hematocrit 39.0 - 52.0 % 39.6 41.2 40.1  Platelets 150 -  400 K/uL 150 83(L) 155   Lipid Panel Recent Labs    07/11/19 0811 07/11/19 0813 07/17/19 1304  CHOL  --  91* 81*  TRIG  --  187* 114  LDLCALC  --  25 19  HDL  --  36* 41  LDLDIRECT 27  --   --     HEMOGLOBIN A1C Lab Results  Component Value Date   HGBA1C 6.2 (H) 07/11/2019   MPG 120 05/02/2008   TSH No results for input(s): TSH in the last 8760 hours.  Lipoprotein (a) <75.0 nmol/L 15.1, apolipoprotein B normal at 44.   Date 07/11/2019  Medications and allergies   Allergies  Allergen Reactions  . Vicodin [Hydrocodone-Acetaminophen] Nausea And Vomiting    Outpatient Medications Prior to Visit  Medication Sig Dispense Refill  . allopurinol (ZYLOPRIM) 100 MG tablet Take 100 mg by mouth daily.    Marland Kitchen amLODipine (NORVASC) 10 MG tablet Take 10 mg by mouth daily.     Marland Kitchen apixaban (ELIQUIS) 5 MG TABS tablet Take 1 tablet (5 mg total) by mouth 2 (two) times daily. 60 tablet 2  . aspirin 81 MG chewable tablet Chew 1 tablet (81 mg total) by mouth daily. 30 tablet 1  . atorvastatin (LIPITOR) 80 MG tablet TAKE 1 TABLET BY MOUTH DAILY 90 tablet 3  . colchicine 0.6 MG tablet Take 0.6 mg by mouth daily as needed (gout flare).    Marland Kitchen escitalopram (LEXAPRO) 20 MG tablet Take 20 mg by mouth daily.    . hydrOXYzine (ATARAX/VISTARIL) 25 MG tablet Take 25 mg by mouth at bedtime.    Marland Kitchen losartan-hydrochlorothiazide (HYZAAR) 100-12.5 MG tablet Take 1 tablet by mouth daily.     . metoprolol tartrate (LOPRESSOR) 50 MG tablet Take 1 tablet (50 mg total) by mouth 3 (three) times daily. (Patient taking differently: Take 50 mg by mouth in the morning, at noon, and at bedtime.) 90 tablet 1  . nitroGLYCERIN (NITROSTAT) 0.4 MG SL tablet Place 1 tablet (0.4 mg total) under the tongue every 5 (five) minutes as needed. For chest pain (Patient taking differently: Place 0.4 mg under the tongue every 5  (five) minutes as needed for chest pain.) 30 tablet 0  . traZODone (DESYREL) 100 MG tablet Take 100 mg by mouth at bedtime.    Marland Kitchen VASCEPA 1 g capsule TAKE 2 CAPSULES(2 GRAMS) BY MOUTH TWICE DAILY (Patient taking differently: Take 2 g by mouth 2 (two) times daily.) 120 capsule 3   No facility-administered medications prior to visit.    Radiology:   CTA head and neck 02/11/2020: 1. Atherosclerotic changes at the bilateral carotid bifurcations. Focal area of bandlike stenosis in the right carotid bifurcation resulting in approximately 90% stenosis. 2. Atherosclerotic changes of the left carotid bifurcation resulting in approximately 70% stenosis. 3. No acute intracranial abnormality. Remote infarcts within the bilateral basal ganglia. Chronic microangiopathy. 4. Emphysema and aortic atherosclerosis.  Cardiac Studies:   Echocardiogram 07/18/2019: Normal LV systolic function with visual EF 50-55%. Left ventricle cavity is normal in size. Moderate left ventricular hypertrophy. Normal global wall motion. Doppler evidence of grade II  diastolic dysfunction, elevated LAP.  Left atrial cavity is mildly dilated. Mild (Grade I) mitral regurgitation. Mild tricuspid regurgitation. Mild pulmonic regurgitation. Prior study dated 02/25/2016 noted LVEF 65%, mild to moderate MR, mild TR.   Carotid artery duplex  12/28/2019: Stenosis in the right internal carotid artery (80-99%). Stenosis in the right external carotid artery (>50%). Stenosis in the left internal carotid artery (80-99%). Stenosis  in the left external carotid artery (<50%). The PSV internal/common carotid artery ratio is consistent with a stenosis of >70% bilaterally, right  7.89 and left 8.67.  Antegrade right vertebral artery flow. Antegrade left vertebral artery flow. Follow up in four months is appropriate if clinically indicated. Peak velocity on the right was 349/117 cm/s and 334/106 cm/s on the left. Compared to the study done on  07/18/2019, there is progression of disease in bilateral internal carotid artery.  Previously right ICA stenosis was 50 to 69% and left was >70%. See attached images.   EKG:     EKG 04/04/2020: Atrial fibrillation with controlled ventricular response at the rate of 67 bpm, normal axis, poor R wave progression, cannot exclude anteroseptal infarct old.  No evidence of ischemia.  No significant change from EKG 03/05/2020.  EKG 01/09/2020: Marked sinus bradycardia at rate of 46 bpm, m normal axis, poor R wave progression, probably normal variant.  No evidence of ischemia.    Assessment     ICD-10-CM   1. Persistent atrial fibrillation (HCC)  I48.19 EKG 12-Lead  2. Atherosclerosis of native coronary artery of native heart without angina pectoris  I25.10   3. Bilateral carotid artery stenosis  I65.23   4. Primary hypertension  I10   5. Tobacco use disorder  F17.200    {CHA2DS2-VASc Score is 5.  Yearly risk of stroke: 7.2% (A, HTN, Vasc Dz, CVA).  Score of 1=0.6; 2=2.2; 3=3.2; 4=4.8; 5=7.2; 6=9.8; 7=>9.8) -(CHF; HTN; vasc disease DM,  Male = 1; Age <65 =0; 65-74 = 1,  >75 =2; stroke/embolism= 2).     There are no discontinued medications.  Meds ordered this encounter  Medications  . buPROPion (WELLBUTRIN SR) 150 MG 12 hr tablet    Sig: Take 1 tablet (150 mg total) by mouth 2 (two) times daily. Start one tab daily for 3 days. Then take morning and afternoon    Dispense:  60 tablet    Refill:  2   Recommendations:   [name] is a 65 y.o.  Caucasian male with history of non-STEMI and angioplasty and stent, peripheral artery disease with interventions to his lower extremity in the remote past in [place], primary hypertension, mixed hyperlipidemia, active smoking, active marijuana use, history of substance abuse remotely, hypertension, history of TIA and excessive alcohol use quit in July 2021, family history of premature coronary artery disease in which his father died at the age of 23 and his  mother at age 7 from MI with CABG in her 65s.    Underwent left carotid endarterectomy on 03/04/2020 and was found to have postop atrial fibrillation with RVR.  Now scheduled for right carotid endarterectomy on 04/08/2020.  He had developed postoperatively atrial fibrillation and now he is has persistent atrial fibrillation.  He is presently on anticoagulation with Eliquis, continue the same.  He is holding the starting tonight for upcoming endarterectomy.  He is also on aspirin, I may consider discontinuing this on his next office visit in 2 to 3 months.  Blood pressure is relatively well controlled, in view of high-grade carotid stenosis I do not want to make any changes to his medications presently.  Lipids are controlled.  He is very motivated in smoking cessation, I prescribed him Wellbutrin for the same.   Adrian Prows, MD, [facility] 04/04/2020, 10:35 AM Office: [phone]

## 2020-04-07 ENCOUNTER — Encounter (HOSPITAL_COMMUNITY): Payer: Self-pay | Admitting: Vascular Surgery

## 2020-04-07 ENCOUNTER — Other Ambulatory Visit (HOSPITAL_COMMUNITY): Payer: Medicare (Managed Care)

## 2020-04-07 ENCOUNTER — Inpatient Hospital Stay (HOSPITAL_COMMUNITY)
Admission: RE | Admit: 2020-04-07 | Discharge: 2020-04-07 | Disposition: A | Discharge status: Home / Self Care | Payer: 59 | Source: Ambulatory Visit

## 2020-04-07 NOTE — Progress Notes (Signed)
Anesthesia Chart Review:  Case: [id number] Date/Time: 04/08/20 1010   Procedure: RIGHT CAROTID ENDARTERECTOMY (Right )   Anesthesia type: General   Pre-op diagnosis: RIGHT CAROTID ARTERY STENOSIS   Location: [facility] OR ROOM 11 / [facility]   Surgeons: Rosetta Posner, MD      DISCUSSION: Patient is a 65 year old male scheduled for the above procedure.  He is s/p left carotid endarterectomy on 03/04/20 as part of a staged approach for bilateral severe carotid artery stenosis. Surgery was scheduled for 04/08/20, but OR scheduling just called to report case is being cancelled.  His PAT had been scheduled for this afternoon (04/07/20).   History includes smoking, HTN, CAD (NSTEMI, s/p DES LAD x2 & PTCA PDA 12/18/15), PAD (remote intervention in [place]; right renal artery stent noted 12/18/15 aortogram), TIA (> 20 years ago), carotid artery disease (s/p left CEA 03/04/20), afib (episode of afib in 12/2005 in the setting of recent cocaine use; recurrent afib 03/04/20 post left CEA), HLD, dementia (short term memory issues), polysubstance abuse (previous cocaine 2007; uses marijuana weekly, quit alcohol 09/2019), AKI (12/2015 in setting of dehydration/diarrhea).  Last visit with cardiologist Dr. Einar Gip on 04/05/19. He is aware of surgery plans. Patient is persistent afib since 03/04/20. He was started on Eliquis. Per posting, he is to hold Eliquis for 3 days prior to surgery and continue ASA.   VS: There were no vitals taken for this visit.  PROVIDERS: [name], MD is PCP Adrian Prows, MD is cardiologist  LABS: Pending PAT.   IMAGES: CTA head/neck 02/11/2020: IMPRESSION: 1. Atherosclerotic changes at the bilateral carotid bifurcations. Focal area of bandlike stenosis in the right carotid bifurcation resulting in approximately 90% stenosis. 2. Atherosclerotic changes of the left carotid bifurcation resulting in approximately 70% stenosis. 3. No acute intracranial abnormality. Remote infarcts within the bilateral basal  ganglia. Chronic microangiopathy. 4. Emphysema and aortic atherosclerosis. (S/p left CEA 03/04/2020)   EKG: EKG 04/04/2020 [facility]): Atrial fibrillation with controlled ventricular response at the rate of 67 bpm, normal axis, poor R wave progression, cannot exclude anteroseptal infarct old.  No evidence of ischemia.  No significant change from EKG 03/05/2020.  EKG 03/05/2020 [facility]):  Atrial fibrillation with premature ventricular or aberrantly conducted complexes Nonspecific T wave abnormality Prolonged QT (QT 438 ms/QTc 482 ms)  EKG 03/04/20 [facility]): Atrial fibrillation with rapid ventricular response with premature ventricular or aberrantly conducted complexes Nonspecific T wave abnormality  EKG 01/09/2020 [facility]): Marked sinus bradycardia at rate of 46 bpm, m normal axis, poor R wave progression, probably normal variant.  No evidence of ischemia.   CV: Carotid artery duplex 12/28/2019:  Stenosis in the right internal carotid artery (80-99%). Stenosis in the  right external carotid artery (>50%).  Stenosis in the left internal carotid artery (80-99%). Stenosis in the  left external carotid artery (<50%).  The PSV internal/common carotid artery ratio is consistent with a stenosis  of >70% bilaterally, right 7.89 and left 8.67.  Antegrade right vertebral artery flow. Antegrade left vertebral artery  flow.  - S/p left CEA 03/04/2020   Echocardiogram 07/18/2019:  Normal LV systolic function with visual EF 50-55%. Left ventricle cavity  is normal in size. Moderate left ventricular hypertrophy. Normal global  wall motion. Doppler evidence of grade II diastolic dysfunction, elevated  LAP.  Left atrial cavity is mildly dilated.  Mild (Grade I) mitral regurgitation.  Mild tricuspid regurgitation.  Mild pulmonic regurgitation.  Prior study dated 02/25/2016 noted LVEF 65%, mild to moderate  MR, mild TR.   LHC/Abdominal Aortogram 12/18/15: PCI:  Successful PTCA and stenting of the proximal and mid LAD with 2 overlapping 4.0 x 22 and 3.5 x 12 mm resolute integrity DES, stenosis reduced from 99% to 0%. PTCA and balloon angioplasty of the distal PDA with a 2.5 x 15 mm Emerge with reduction of stenosis from 90% to less than 30%.  1. Severe diffuse coronary artery disease, scattered disease evident throughout the RCA, PDA has tandem 60% and a 90% stenosis. Distal lesion reduced to less than 30% with balloon angioplasty. 2. High-grade complex 99% stenosis of the proximal LAD. Successful angioplasty as noted above. 3. Mild disease in the circumflex with intermediate lesions 60-70% in OM 3 and OM 4 and distal circumflex AV groove branch. 4. Abdominal aortogram: No evidence of abdominal aortic aneurysm. There are 2 renal arteries on the left, one renal artery on the right. A renal artery stent is evident on the right, renal artery is visible and patent, in-stent restenosis is present could not be evaluated due to nonselective nature of abdominal aortogram. Consider other modalities to evaluate renal artery stenosis, selective renal arteriogram was not performed as patient was not consented and also procedure performed through radial access.  Recommendation: Patient needs aggressive risk factor modification, smoking cessation, needs dual antiplatelet therapy. A total of 290 mL contrast utilized.   Past Medical History:  Diagnosis Date  . AKI (acute kidney injury) ([place]) 12/2015  . Anxiety   . Arthritis   . Carotid artery stenosis   . Coronary artery disease   . Dementia ([place])    problems with short term memory  . Depression   . Dysrhythmia    episode of afib 12/2005 in setting of cocaine use  . Gout   . Hyperlipidemia   . Hypertension   . Myocardial infarction ([place])    2017  . PAD (peripheral artery disease) ([place])   . TIA (transient ischemic attack)    over 20 years ago    Past Surgical History:  Procedure Laterality Date  . CARDIAC  CATHETERIZATION N/A 12/18/2015   Procedure: Left Heart Cath and Coronary Angiography;  Surgeon: Adrian Prows, MD;  Location: [place] [facility];  Service: Cardiovascular;  Laterality: N/A;  . CARDIAC CATHETERIZATION N/A 12/18/2015   Procedure: Coronary Stent Intervention;  Surgeon: Adrian Prows, MD;  Location: [place] [facility];  Service: Cardiovascular;  Laterality: N/A;  . CARDIAC CATHETERIZATION N/A 12/18/2015   Procedure: Coronary Balloon Angioplasty;  Surgeon: Adrian Prows, MD;  Location: [place] [facility];  Service: Cardiovascular;  Laterality: N/A;  . COLONOSCOPY N/A 01/12/2016   Procedure: COLONOSCOPY;  Surgeon: Manus Gunning, MD;  Location: [place];  Service: Gastroenterology;  Laterality: N/A;  . ENDARTERECTOMY Left 03/04/2020   Procedure: LEFT ENDARTERECTOMY CAROTID;  Surgeon: Rosetta Posner, MD;  Location: [place];  Service: Vascular;  Laterality: Left;  . HERNIA REPAIR    . PATCH ANGIOPLASTY Left 03/04/2020   Procedure: PATCH ANGIOPLASTY;  Surgeon: Rosetta Posner, MD;  Location: [place];  Service: Vascular;  Laterality: Left;  . PERIPHERAL VASCULAR CATHETERIZATION N/A 12/18/2015   Procedure: Abdominal Aortogram;  Surgeon: Adrian Prows, MD;  Location: [place] [facility];  Service: Cardiovascular;  Laterality: N/A;    MEDICATIONS: . allopurinol (ZYLOPRIM) 100 MG tablet  . amLODipine (NORVASC) 10 MG tablet  . apixaban (ELIQUIS) 5 MG TABS tablet  . aspirin 81 MG chewable tablet  . atorvastatin (LIPITOR) 80 MG tablet  . buPROPion [facility] SR)  150 MG 12 hr tablet  . colchicine 0.6 MG tablet  . escitalopram (LEXAPRO) 20 MG tablet  . hydrOXYzine (ATARAX/VISTARIL) 25 MG tablet  . losartan-hydrochlorothiazide (HYZAAR) 100-12.5 MG tablet  . metoprolol tartrate (LOPRESSOR) 50 MG tablet  . nitroGLYCERIN (NITROSTAT) 0.4 MG SL tablet  . traZODone (DESYREL) 100 MG tablet  . VASCEPA 1 g capsule   No current facility-administered medications for this encounter.    Myra Gianotti,  PA-C Surgical Short Stay/Anesthesiology [facility] Phone [phone] [facility] Phone [phone] 04/07/2020 9:24 AM

## 2020-04-07 NOTE — Progress Notes (Signed)
Your procedure is scheduled on Tuesday Apr 08, 2020.  Report to [facility] Main Entrance "A" at 08:25 A.M., and check in at the Admitting office.  Call this number if you have problems the morning of surgery: [phone]  Call [phone] if you have any questions prior to your surgery date Monday-Friday 8am-4pm   Remember: Do not eat or drink after midnight the night before your surgery  Take these medicines the morning of surgery with A SIP OF WATER: allopurinol (ZYLOPRIM)  amLODipine (NORVASC) atorvastatin (LIPITOR)  buPROPion (WELLBUTRIN SR)  escitalopram (LEXAPRO) metoprolol tartrate (LOPRESSOR)  If needed:  nitroGLYCERIN (NITROSTAT)  Follow your surgeon's instructions on when to stop Aspirin and apixaban (ELIQUIS).  If no instructions were given by your surgeon then you will need to call the office to get those instructions.    As of today, STOP taking any Aleve, Naproxen, Ibuprofen, Motrin, Advil, Goody's, BC's, all herbal medications, fish oil, and all vitamins.    The Morning of Surgery  Do not wear jewelry  Do not wear lotions, powders, colognes, or deodorant Men may shave face and neck.  Do not bring valuables to the hospital.  [facility] is not responsible for any belongings or valuables.  If you are a smoker, DO NOT Smoke 24 hours prior to surgery  If you wear a CPAP at night please bring your mask the morning of surgery   Remember that you must have someone to transport you home after your surgery, and remain with you for 24 hours if you are discharged the same day.   Please bring cases for contacts, glasses, hearing aids, dentures or bridgework because it cannot be worn into surgery.    Leave your suitcase in the car.  After surgery it may be brought to your room.  For patients admitted to the hospital, discharge time will be determined by your treatment team.  Patients discharged the day of surgery will not be allowed to drive home.    Special  instructions:   [place]- Preparing For Surgery  Before surgery, you can play an important role. Because skin is not sterile, your skin needs to be as free of germs as possible. You can reduce the number of germs on your skin by washing with CHG (chlorahexidine gluconate) Soap before surgery.  CHG is an antiseptic cleaner which kills germs and bonds with the skin to continue killing germs even after washing.    Oral Hygiene is also important to reduce your risk of infection.  Remember - BRUSH YOUR TEETH THE MORNING OF SURGERY WITH YOUR REGULAR TOOTHPASTE  Please do not use if you have an allergy to CHG or antibacterial soaps. If your skin becomes reddened/irritated stop using the CHG.  Do not shave (including legs and underarms) for at least 48 hours prior to first CHG shower. It is OK to shave your face.  Please follow these instructions carefully.   1. Shower the NIGHT BEFORE SURGERY and the MORNING OF SURGERY with CHG Soap.   2. If you chose to wash your hair and body, wash as usual with your normal shampoo and body-wash/soap.  3. Rinse your hair and body thoroughly to remove the shampoo and soap.  4. Apply CHG directly to the skin (ONLY FROM THE NECK DOWN) and wash gently with a scrungie or a clean washcloth.   5. Do not use on open wounds or open sores. Avoid contact with your eyes, ears, mouth and genitals (private parts). Wash Face and  genitals (private parts)  with your normal soap.   6. Wash thoroughly, paying special attention to the area where your surgery will be performed.  7. Thoroughly rinse your body with warm water from the neck down.  8. DO NOT shower/wash with your normal soap after using and rinsing off the CHG Soap.  9. Pat yourself dry with a CLEAN TOWEL.  10. Wear CLEAN PAJAMAS to bed the night before surgery  11. Place CLEAN SHEETS on your bed the night of your first shower and DO NOT SLEEP WITH PETS.  12. Wear comfortable clothes the morning of surgery.      Day of Surgery:  Please shower the morning of surgery with the CHG soap Do not apply any deodorants/lotions. Please wear clean clothes to the hospital/surgery center.   Remember to brush your teeth WITH YOUR REGULAR TOOTHPASTE.   Please read over the following fact sheets that you were given.

## 2020-04-08 ENCOUNTER — Encounter (HOSPITAL_COMMUNITY): Admission: RE | Payer: Self-pay | Source: Home / Self Care

## 2020-04-08 ENCOUNTER — Inpatient Hospital Stay (HOSPITAL_COMMUNITY): Admission: RE | Admit: 2020-04-08 | Payer: 59 | Source: Home / Self Care | Admitting: Vascular Surgery

## 2020-04-08 SURGERY — ENDARTERECTOMY, CAROTID
Anesthesia: General | Laterality: Right

## 2020-04-10 ENCOUNTER — Telehealth: Payer: Self-pay

## 2020-04-10 ENCOUNTER — Ambulatory Visit: Payer: Medicaid Other | Admitting: Cardiology

## 2020-04-10 NOTE — Telephone Encounter (Signed)
Telephone call received from pt stating he previously cxld right CEA surgery due to his wife being sick and is now ready to reschedule. Pt rescheduled with Dr. Donnetta Hutching for 04/22/20. He will hold Eliquis 3 days prior to surgery. Pt verbalized understanding to all instructions.

## 2020-04-15 NOTE — Progress Notes (Signed)
Your procedure is scheduled on  Apr 22, 2020.  Report to [facility] Main Entrance "A" at 7:30 A.M., and check in at the Admitting office.  Call this number if you have problems the morning of surgery: [phone]  Call [phone] if you have any questions prior to your surgery date Monday-Friday 8am-4pm   Remember: Do not eat or drink after midnight the night before your surgery  Take these medicines the morning of surgery with A SIP OF WATER: allopurinol (ZYLOPRIM)  amLODipine (NORVASC) atorvastatin (LIPITOR)  aspirin buPROPion (WELLBUTRIN SR)  escitalopram (LEXAPRO) metoprolol tartrate (LOPRESSOR) VASCEPA  If needed:  nitroGLYCERIN (NITROSTAT)  Stop taking Eliquis 3 days prior to surgery (stop on 1/22). Continue taking aspirin through the day of surgery.   As of today, STOP taking any Aleve, Naproxen, Ibuprofen, Motrin, Advil, Goody's, BC's, all herbal medications, fish oil, and all vitamins.    The Morning of Surgery  Do not wear jewelry  Do not wear lotions, powders, colognes, or deodorant Men may shave face and neck.  Do not bring valuables to the hospital.  [facility] is not responsible for any belongings or valuables.  If you are a smoker, DO NOT Smoke 24 hours prior to surgery  If you wear a CPAP at night please bring your mask the morning of surgery   Remember that you must have someone to transport you home after your surgery, and remain with you for 24 hours if you are discharged the same day.   Please bring cases for contacts, glasses, hearing aids, dentures or bridgework because it cannot be worn into surgery.    Leave your suitcase in the car.  After surgery it may be brought to your room.  For patients admitted to the hospital, discharge time will be determined by your treatment team.  Patients discharged the day of surgery will not be allowed to drive home.    Special instructions:   [place]- Preparing For Surgery  Before surgery,  you can play an important role. Because skin is not sterile, your skin needs to be as free of germs as possible. You can reduce the number of germs on your skin by washing with CHG (chlorahexidine gluconate) Soap before surgery.  CHG is an antiseptic cleaner which kills germs and bonds with the skin to continue killing germs even after washing.    Oral Hygiene is also important to reduce your risk of infection.  Remember - BRUSH YOUR TEETH THE MORNING OF SURGERY WITH YOUR REGULAR TOOTHPASTE  Please do not use if you have an allergy to CHG or antibacterial soaps. If your skin becomes reddened/irritated stop using the CHG.  Do not shave (including legs and underarms) for at least 48 hours prior to first CHG shower. It is OK to shave your face.  Please follow these instructions carefully.   1. Shower the NIGHT BEFORE SURGERY and the MORNING OF SURGERY with CHG Soap.   2. If you chose to wash your hair and body, wash as usual with your normal shampoo and body-wash/soap.  3. Rinse your hair and body thoroughly to remove the shampoo and soap.  4. Apply CHG directly to the skin (ONLY FROM THE NECK DOWN) and wash gently with a scrungie or a clean washcloth.   5. Do not use on open wounds or open sores. Avoid contact with your eyes, ears, mouth and genitals (private parts). Wash Face and genitals (private parts)  with your normal soap.   6. Wash thoroughly,  paying special attention to the area where your surgery will be performed.  7. Thoroughly rinse your body with warm water from the neck down.  8. DO NOT shower/wash with your normal soap after using and rinsing off the CHG Soap.  9. Pat yourself dry with a CLEAN TOWEL.  10. Wear CLEAN PAJAMAS to bed the night before surgery  11. Place CLEAN SHEETS on your bed the night of your first shower and DO NOT SLEEP WITH PETS.  12. Wear comfortable clothes the morning of surgery.     Day of Surgery:  Please shower the morning of surgery with the  CHG soap Do not apply any deodorants/lotions. Please wear clean clothes to the hospital/surgery center.   Remember to brush your teeth WITH YOUR REGULAR TOOTHPASTE.   Please read over the following fact sheets that you were given.

## 2020-04-16 ENCOUNTER — Other Ambulatory Visit: Payer: Self-pay

## 2020-04-16 ENCOUNTER — Encounter (HOSPITAL_COMMUNITY)
Admission: RE | Admit: 2020-04-16 | Discharge: 2020-04-16 | Disposition: A | Discharge status: Home / Self Care | Payer: 59 | Source: Ambulatory Visit | Attending: Vascular Surgery | Admitting: Vascular Surgery

## 2020-04-16 ENCOUNTER — Encounter (HOSPITAL_COMMUNITY): Payer: Self-pay

## 2020-04-16 DIAGNOSIS — I2510 Atherosclerotic heart disease of native coronary artery without angina pectoris: Secondary | ICD-10-CM | POA: Insufficient documentation

## 2020-04-16 DIAGNOSIS — Z7901 Long term (current) use of anticoagulants: Secondary | ICD-10-CM | POA: Diagnosis not present

## 2020-04-16 DIAGNOSIS — J439 Emphysema, unspecified: Secondary | ICD-10-CM | POA: Insufficient documentation

## 2020-04-16 DIAGNOSIS — Z01812 Encounter for preprocedural laboratory examination: Secondary | ICD-10-CM | POA: Insufficient documentation

## 2020-04-16 DIAGNOSIS — Z8673 Personal history of transient ischemic attack (TIA), and cerebral infarction without residual deficits: Secondary | ICD-10-CM | POA: Diagnosis not present

## 2020-04-16 DIAGNOSIS — F17210 Nicotine dependence, cigarettes, uncomplicated: Secondary | ICD-10-CM | POA: Insufficient documentation

## 2020-04-16 DIAGNOSIS — I700 Atherosclerosis of aorta: Secondary | ICD-10-CM | POA: Diagnosis not present

## 2020-04-16 DIAGNOSIS — I10 Essential (primary) hypertension: Secondary | ICD-10-CM | POA: Diagnosis not present

## 2020-04-16 DIAGNOSIS — I4891 Unspecified atrial fibrillation: Secondary | ICD-10-CM | POA: Diagnosis not present

## 2020-04-16 DIAGNOSIS — Z7982 Long term (current) use of aspirin: Secondary | ICD-10-CM | POA: Diagnosis not present

## 2020-04-16 DIAGNOSIS — I340 Nonrheumatic mitral (valve) insufficiency: Secondary | ICD-10-CM | POA: Diagnosis not present

## 2020-04-16 DIAGNOSIS — I252 Old myocardial infarction: Secondary | ICD-10-CM | POA: Insufficient documentation

## 2020-04-16 DIAGNOSIS — I739 Peripheral vascular disease, unspecified: Secondary | ICD-10-CM | POA: Diagnosis not present

## 2020-04-16 DIAGNOSIS — I6523 Occlusion and stenosis of bilateral carotid arteries: Secondary | ICD-10-CM | POA: Diagnosis not present

## 2020-04-16 DIAGNOSIS — F0390 Unspecified dementia without behavioral disturbance: Secondary | ICD-10-CM | POA: Insufficient documentation

## 2020-04-16 HISTORY — DX: Cerebral infarction, unspecified: I63.9

## 2020-04-16 LAB — APTT: aPTT: 38 seconds — ABNORMAL HIGH (ref 24–36)

## 2020-04-16 LAB — TYPE AND SCREEN
ABO/RH(D): O NEG
Antibody Screen: NEGATIVE

## 2020-04-16 LAB — CBC
HCT: 41 % (ref 39.0–52.0)
Hemoglobin: 13.9 g/dL (ref 13.0–17.0)
MCH: 33.2 pg (ref 26.0–34.0)
MCHC: 33.9 g/dL (ref 30.0–36.0)
MCV: 97.9 fL (ref 80.0–100.0)
Platelets: 192 10*3/uL (ref 150–400)
RBC: 4.19 MIL/uL — ABNORMAL LOW (ref 4.22–5.81)
RDW: 14.5 % (ref 11.5–15.5)
WBC: 8.9 10*3/uL (ref 4.0–10.5)
nRBC: 0 % (ref 0.0–0.2)

## 2020-04-16 LAB — COMPREHENSIVE METABOLIC PANEL
ALT: 22 U/L (ref 0–44)
AST: 21 U/L (ref 15–41)
Albumin: 3.8 g/dL (ref 3.5–5.0)
Alkaline Phosphatase: 109 U/L (ref 38–126)
Anion gap: 11 (ref 5–15)
BUN: 24 mg/dL — ABNORMAL HIGH (ref 8–23)
CO2: 23 mmol/L (ref 22–32)
Calcium: 9 mg/dL (ref 8.9–10.3)
Chloride: 104 mmol/L (ref 98–111)
Creatinine, Ser: 1.6 mg/dL — ABNORMAL HIGH (ref 0.61–1.24)
GFR, Estimated: 48 mL/min — ABNORMAL LOW (ref >60)
Glucose, Bld: 101 mg/dL — ABNORMAL HIGH (ref 70–99)
Potassium: 4.3 mmol/L (ref 3.5–5.1)
Sodium: 138 mmol/L (ref 135–145)
Total Bilirubin: 0.6 mg/dL (ref 0.3–1.2)
Total Protein: 6.8 g/dL (ref 6.5–8.1)

## 2020-04-16 LAB — URINALYSIS, ROUTINE W REFLEX MICROSCOPIC
Bilirubin Urine: NEGATIVE
Glucose, UA: NEGATIVE mg/dL
Hgb urine dipstick: NEGATIVE
Ketones, ur: NEGATIVE mg/dL
Leukocytes,Ua: NEGATIVE
Nitrite: NEGATIVE
Protein, ur: NEGATIVE mg/dL
Specific Gravity, Urine: 1.019 (ref 1.005–1.030)
pH: 5 (ref 5.0–8.0)

## 2020-04-16 LAB — SURGICAL PCR SCREEN
MRSA, PCR: NEGATIVE
Staphylococcus aureus: NEGATIVE

## 2020-04-16 LAB — PROTIME-INR
INR: 1.3 — ABNORMAL HIGH (ref 0.8–1.2)
Prothrombin Time: 15.7 seconds — ABNORMAL HIGH (ref 11.4–15.2)

## 2020-04-16 NOTE — Progress Notes (Addendum)
Anesthesia Follow-up:   Case: [id number] Date/Time: 04/22/20 0915   Procedure: RIGHT CAROTID ENDARTERECTOMY (Right )   Anesthesia type: General   Pre-op diagnosis: RIGHT CAROTID ARTERY STENOSIS   Location: [facility] OR ROOM 11 / [place] [facility]   Surgeons: Rosetta Posner, MD      DISCUSSION: Patient is a 65 year old male scheduled for the above procedure.He is s/p left carotid endarterectomy on 03/04/20 as part of a staged approach for bilateral severe carotid artery stenosis. This surgery was initially scheduled for 04/08/20 but his wife become sick, so decision made to reschedule surgery for 04/22/20.  History includes smoking, HTN, CAD (NSTEMI, s/p DES LAD x2 &PTCA PDA 12/18/15), PAD (remote intervention in [place]; right renal artery stent noted 12/18/15 aortogram), TIA (>20 years ago), carotid artery disease (s/p left CEA 03/04/20), afib (episode of afib in 12/2005 in the setting of recent cocaine use; recurrent afib 03/04/20 post left CEA), HLD, dementia (short term memory issues), polysubstance abuse (previous cocaine 2007; uses marijuana weekly, quit alcohol 09/2019), AKI (12/2015 in setting of dehydration/diarrhea).  Last visit with cardiologist Dr. Einar Gip on 04/04/20. He is aware of surgery plans. Patient is persistent afib since 03/04/20. He was started on Eliquis. Per posting, he is to hold Eliquis for 3 days prior to surgery and continue ASA.   Preoperative labs show BUN 24, Cr 1.60 (previous Creatinine 1.13-1.35 since 01/2020). PT 15.7, INR 1.3, PTT 38. He was still on Eliquis at the time of his PAT visit. Will route to Dr. Donnetta Hutching and inquire if he desires any repeat labs prior to surgery.   Preoperative COVID-19 test scheduled for 04/21/20.   UPDATE 1/05/02/20 10:27 AM: Based on PAT lab results, Dr. Donnetta Hutching is okay to proceed without repeat labs prior to surgery.    VS: BP 122/68   Pulse (!) 50   Temp 37.1 C (Oral)   Resp 18   Ht 5\' 6"  (1.676 m)   Wt 83 kg   SpO2 99%   BMI 29.52 kg/m     PROVIDERS: [name], MD is PCP Adrian Prows, MD is cardiologist   LABS: Preoperative labs noted. See DISCUSSION. (all labs ordered are listed, but only abnormal results are displayed)  Labs Reviewed  CBC - Abnormal; Notable for the following components:      Result Value   RBC 4.19 (*)    All other components within normal limits  COMPREHENSIVE METABOLIC PANEL - Abnormal; Notable for the following components:   Glucose, Bld 101 (*)    BUN 24 (*)    Creatinine, Ser 1.60 (*)    GFR, Estimated 48 (*)    All other components within normal limits  PROTIME-INR - Abnormal; Notable for the following components:   Prothrombin Time 15.7 (*)    INR 1.3 (*)    All other components within normal limits  APTT - Abnormal; Notable for the following components:   aPTT 38 (*)    All other components within normal limits  URINALYSIS, ROUTINE W REFLEX MICROSCOPIC - Abnormal; Notable for the following components:   APPearance HAZY (*)    All other components within normal limits  SURGICAL PCR SCREEN  TYPE AND SCREEN    IMAGES: CTA head/neck 02/11/2020: IMPRESSION: 1. Atherosclerotic changes at the bilateral carotid bifurcations. Focal area of bandlike stenosis in the right carotid bifurcation resulting in approximately 90% stenosis. 2. Atherosclerotic changes of the left carotid bifurcation resulting in approximately 70% stenosis. 3. No acute intracranial abnormality. Remote infarcts within the  bilateral basal ganglia. Chronic microangiopathy. 4. Emphysema and aortic atherosclerosis. (S/p left CEA 03/04/2020)   EKG: EKG 04/04/2020 [facility]): Atrial fibrillation with controlled ventricular response at the rate of 67 bpm, normal axis, poor R wave progression, cannot exclude anteroseptal infarct old. No evidence of ischemia. No significant change fromEKG 03/05/2020.  EKG 03/05/2020 [facility]):  Atrial fibrillation with premature ventricular or aberrantly conducted  complexes Nonspecific T wave abnormality Prolonged QT (QT 438 ms/QTc 482 ms)  EKG 03/04/2020 [facility]): Atrial fibrillation with rapid ventricular response with premature ventricular or aberrantly conducted complexes Nonspecific T wave abnormality  EKG 01/09/2020([facility]): Marked sinus bradycardia at rate of 46 bpm, m normal axis, poor R wave progression, probably normal variant. No evidence of ischemia.   CV: Carotid artery duplex 12/28/2019:  Stenosis in the right internal carotid artery (80-99%). Stenosis in the  right external carotid artery (>50%).  Stenosis in the left internal carotid artery (80-99%). Stenosis in the  left external carotid artery (<50%).  The PSV internal/common carotid artery ratio is consistent with a stenosis  of >70% bilaterally, right 7.89 and left 8.67.  Antegrade right vertebral artery flow. Antegrade left vertebral artery  flow.  - S/p left CEA 03/04/2020   Echocardiogram 07/18/2019:  Normal LV systolic function with visual EF 50-55%. Left ventricle cavity  is normal in size. Moderate left ventricular hypertrophy. Normal global  wall motion. Doppler evidence of grade II diastolic dysfunction, elevated  LAP.  Left atrial cavity is mildly dilated.  Mild (Grade I) mitral regurgitation.  Mild tricuspid regurgitation.  Mild pulmonic regurgitation.  Prior study dated 02/25/2016 noted LVEF 65%, mild to moderate MR, mild TR.   LHC/Abdominal Aortogram 12/18/2015: [id number] PTCA and stenting of the proximal and mid LAD with 2 overlapping 4.0 x 22 and 3.5 x 12 mm resolute integrity DES, stenosis reduced from 99% to 0%. PTCA and balloon angioplasty of the distal PDA with a 2.5 x 15 mm Emerge with reduction of stenosis from 90% to less than 30%.  1. Severe diffuse coronary artery disease, scattered disease evident throughout the RCA, PDA has tandem 60% and a 90% stenosis. Distal lesion reduced to less than 30% with balloon  angioplasty. 2. High-grade complex 99% stenosis of the proximal LAD. Successful angioplasty as noted above. 3. Mild disease in the circumflex with intermediate lesions 60-70% in OM 3 and OM 4 and distal circumflex AV groove branch. 4. Abdominal aortogram: No evidence of abdominal aortic aneurysm. There are 2 renal arteries on the left, one renal artery on the right. A renal artery stent is evident on the right, renal artery is visible and patent, in-stent restenosis is present could not be evaluated due to nonselective nature of abdominal aortogram. Consider other modalities to evaluate renal artery stenosis, selective renal arteriogram was not performed as patient was not consented and also procedure performed through radial access.  Recommendation: Patient needs aggressive risk factor modification, smoking cessation, needs dual antiplatelet therapy. A total of 290 mL contrast utilized.   Past Medical History:  Diagnosis Date  . AKI (acute kidney injury) ([place]) 12/2015  . Anxiety   . Arthritis   . Carotid artery stenosis   . Coronary artery disease   . Dementia ([place])    problems with short term memory  . Depression   . Dysrhythmia    episode of afib 12/2005 in setting of cocaine use  . Gout   . Hyperlipidemia   . Hypertension   . Myocardial infarction ([place])    2017  .  PAD (peripheral artery disease) ([place])   . Stroke [facility])    TIA  . TIA (transient ischemic attack)    over 20 years ago    Past Surgical History:  Procedure Laterality Date  . CARDIAC CATHETERIZATION N/A 12/18/2015   Procedure: Left Heart Cath and Coronary Angiography;  Surgeon: Adrian Prows, MD;  Location: [place] CV LAB;  Service: Cardiovascular;  Laterality: N/A;  . CARDIAC CATHETERIZATION N/A 12/18/2015   Procedure: Coronary Stent Intervention;  Surgeon: Adrian Prows, MD;  Location: [place] CV LAB;  Service: Cardiovascular;  Laterality: N/A;  . CARDIAC CATHETERIZATION N/A 12/18/2015   Procedure: Coronary Balloon  Angioplasty;  Surgeon: Adrian Prows, MD;  Location: [place] CV LAB;  Service: Cardiovascular;  Laterality: N/A;  . COLONOSCOPY N/A 01/12/2016   Procedure: COLONOSCOPY;  Surgeon: Manus Gunning, MD;  Location: [place];  Service: Gastroenterology;  Laterality: N/A;  . ENDARTERECTOMY Left 03/04/2020   Procedure: LEFT ENDARTERECTOMY CAROTID;  Surgeon: Rosetta Posner, MD;  Location: [place];  Service: Vascular;  Laterality: Left;  . HERNIA REPAIR    . PATCH ANGIOPLASTY Left 03/04/2020   Procedure: PATCH ANGIOPLASTY;  Surgeon: Rosetta Posner, MD;  Location: [place];  Service: Vascular;  Laterality: Left;  . PERIPHERAL VASCULAR CATHETERIZATION N/A 12/18/2015   Procedure: Abdominal Aortogram;  Surgeon: Adrian Prows, MD;  Location: [place] CV LAB;  Service: Cardiovascular;  Laterality: N/A;    MEDICATIONS: . allopurinol (ZYLOPRIM) 100 MG tablet  . amLODipine (NORVASC) 10 MG tablet  . apixaban (ELIQUIS) 5 MG TABS tablet  . aspirin 81 MG chewable tablet  . atorvastatin (LIPITOR) 80 MG tablet  . buPROPion (WELLBUTRIN SR) 150 MG 12 hr tablet  . colchicine 0.6 MG tablet  . escitalopram (LEXAPRO) 20 MG tablet  . hydrOXYzine (ATARAX/VISTARIL) 25 MG tablet  . losartan-hydrochlorothiazide (HYZAAR) 100-12.5 MG tablet  . metoprolol tartrate (LOPRESSOR) 50 MG tablet  . nitroGLYCERIN (NITROSTAT) 0.4 MG SL tablet  . traZODone (DESYREL) 100 MG tablet  . VASCEPA 1 g capsule   No current facility-administered medications for this encounter.    Myra Gianotti, PA-C Surgical Short Stay/Anesthesiology [facility] Phone [phone] [facility] Phone [phone] 04/16/2020 4:44 PM

## 2020-04-16 NOTE — Progress Notes (Addendum)
PCP - Cheri Rous Cardiologist - Dr. Adrian Prows  PPM/ICD - denies   Chest x-ray - n/a EKG - 04/04/20 Stress Test -05/03/2008  ECHO - 03/26/2009 Cardiac Cath - 12/18/2015  Sleep Study - denies  No diabetes  Blood Thinner Instructions: Hold eliquis 3 days prior to surgery.  Aspirin Instructions: Patient instructed to continue taking aspirin through the day of surgery.    ERAS Protcol -no   COVID TEST- 04/21/2020   Anesthesia review: Ebony Hail notified. Chart already reviewed on 04/07/20. She will look at his chart again for any changes.  Patient denies shortness of breath, fever, cough and chest pain at PAT appointment   All instructions explained to the patient, with a verbal understanding of the material. Patient agrees to go over the instructions while at home for a better understanding. Patient also instructed to self quarantine after being tested for COVID-19. The opportunity to ask questions was provided.

## 2020-04-16 NOTE — Progress Notes (Signed)
Notified Ebony Hail, PA-C of PT 15.7.

## 2020-04-16 NOTE — Anesthesia Preprocedure Evaluation (Addendum)
Anesthesia Evaluation  Patient identified by MRN, date of birth, ID band Patient awake    Reviewed: Allergy & Precautions, NPO status , Patient's Chart, lab work & pertinent test results, reviewed documented beta blocker date and time   Airway Mallampati: I  TM Distance: >3 FB Neck ROM: Full    Dental  (+) Edentulous Upper, Edentulous Lower, Dental Advisory Given   Pulmonary Current Smoker and Patient abstained from smoking.,    Pulmonary exam normal        Cardiovascular hypertension, Pt. on medications and Pt. on home beta blockers + CAD, + Past MI, + Cardiac Stents and + Peripheral Vascular Disease  Normal cardiovascular exam+ dysrhythmias Atrial Fibrillation   Echocardiogram 07/18/2019:  Normal LV systolic function with visual EF 50-55%. Left ventricle cavity  is normal in size. Moderate left ventricular hypertrophy. Normal global  wall motion. Doppler evidence of grade II diastolic dysfunction, elevated  LAP.  Left atrial cavity is mildly dilated.  Mild (Grade I) mitral regurgitation.  Mild tricuspid regurgitation.  Mild pulmonic regurgitation.  Prior study dated 02/25/2016 noted LVEF 65%, mild to moderate MR, mild TR.    Neuro/Psych Anxiety Depression negative neurological ROS     GI/Hepatic negative GI ROS, (+)     substance abuse  cocaine use,   Endo/Other  negative endocrine ROS  Renal/GU Renal InsufficiencyRenal disease     Musculoskeletal negative musculoskeletal ROS (+)   Abdominal   Peds  Hematology negative hematology ROS (+)   Anesthesia Other Findings   Reproductive/Obstetrics                           Anesthesia Physical Anesthesia Plan  ASA: III  Anesthesia Plan: General   Post-op Pain Management:    Induction: Intravenous  PONV Risk Score and Plan: 3 and Ondansetron, Dexamethasone and Treatment may vary due to age or medical condition  Airway Management  Planned: Oral ETT  Additional Equipment: Arterial line  Intra-op Plan:   Post-operative Plan: Extubation in OR  Informed Consent: I have reviewed the patients History and Physical, chart, labs and discussed the procedure including the risks, benefits and alternatives for the proposed anesthesia with the patient or authorized representative who has indicated his/her understanding and acceptance.     Dental advisory given  Plan Discussed with: Anesthesiologist and CRNA  Anesthesia Plan Comments: (PAT note written by Myra Gianotti, PA-C. )      Anesthesia Quick Evaluation

## 2020-04-21 ENCOUNTER — Other Ambulatory Visit (HOSPITAL_COMMUNITY)
Admission: RE | Admit: 2020-04-21 | Discharge: 2020-04-21 | Disposition: A | Discharge status: Home / Self Care | Payer: 59 | Source: Ambulatory Visit | Attending: Vascular Surgery | Admitting: Vascular Surgery

## 2020-04-21 DIAGNOSIS — Z01812 Encounter for preprocedural laboratory examination: Secondary | ICD-10-CM | POA: Insufficient documentation

## 2020-04-21 DIAGNOSIS — Z20822 Contact with and (suspected) exposure to covid-19: Secondary | ICD-10-CM | POA: Insufficient documentation

## 2020-04-21 LAB — SARS CORONAVIRUS 2 (TAT 6-24 HRS): SARS Coronavirus 2: NEGATIVE

## 2020-04-22 ENCOUNTER — Inpatient Hospital Stay (HOSPITAL_COMMUNITY): Payer: 59 | Admitting: Vascular Surgery

## 2020-04-22 ENCOUNTER — Inpatient Hospital Stay (HOSPITAL_COMMUNITY)
Admission: RE | Admit: 2020-04-22 | Discharge: 2020-04-23 | DRG: 039 | Disposition: A | Discharge status: Home / Self Care | Payer: 59 | Attending: Vascular Surgery | Admitting: Vascular Surgery

## 2020-04-22 ENCOUNTER — Inpatient Hospital Stay (HOSPITAL_COMMUNITY): Payer: 59 | Admitting: Anesthesiology

## 2020-04-22 ENCOUNTER — Other Ambulatory Visit: Payer: Self-pay

## 2020-04-22 ENCOUNTER — Encounter (HOSPITAL_COMMUNITY)
Admission: RE | Disposition: A | Discharge status: Home / Self Care | Payer: Self-pay | Source: Home / Self Care | Attending: Vascular Surgery

## 2020-04-22 ENCOUNTER — Encounter (HOSPITAL_COMMUNITY): Payer: Self-pay | Admitting: Vascular Surgery

## 2020-04-22 DIAGNOSIS — M109 Gout, unspecified: Secondary | ICD-10-CM | POA: Diagnosis present

## 2020-04-22 DIAGNOSIS — I4891 Unspecified atrial fibrillation: Secondary | ICD-10-CM | POA: Diagnosis present

## 2020-04-22 DIAGNOSIS — K08109 Complete loss of teeth, unspecified cause, unspecified class: Secondary | ICD-10-CM | POA: Diagnosis present

## 2020-04-22 DIAGNOSIS — F419 Anxiety disorder, unspecified: Secondary | ICD-10-CM | POA: Diagnosis present

## 2020-04-22 DIAGNOSIS — I739 Peripheral vascular disease, unspecified: Secondary | ICD-10-CM | POA: Diagnosis present

## 2020-04-22 DIAGNOSIS — F32A Depression, unspecified: Secondary | ICD-10-CM | POA: Diagnosis present

## 2020-04-22 DIAGNOSIS — F0390 Unspecified dementia without behavioral disturbance: Secondary | ICD-10-CM | POA: Diagnosis present

## 2020-04-22 DIAGNOSIS — I6521 Occlusion and stenosis of right carotid artery: Secondary | ICD-10-CM | POA: Diagnosis present

## 2020-04-22 DIAGNOSIS — E785 Hyperlipidemia, unspecified: Secondary | ICD-10-CM | POA: Diagnosis present

## 2020-04-22 DIAGNOSIS — I10 Essential (primary) hypertension: Secondary | ICD-10-CM | POA: Diagnosis present

## 2020-04-22 DIAGNOSIS — Z20822 Contact with and (suspected) exposure to covid-19: Secondary | ICD-10-CM | POA: Diagnosis present

## 2020-04-22 DIAGNOSIS — Z79899 Other long term (current) drug therapy: Secondary | ICD-10-CM

## 2020-04-22 DIAGNOSIS — Z7901 Long term (current) use of anticoagulants: Secondary | ICD-10-CM

## 2020-04-22 DIAGNOSIS — I2510 Atherosclerotic heart disease of native coronary artery without angina pectoris: Secondary | ICD-10-CM | POA: Diagnosis present

## 2020-04-22 DIAGNOSIS — Z7982 Long term (current) use of aspirin: Secondary | ICD-10-CM

## 2020-04-22 DIAGNOSIS — I252 Old myocardial infarction: Secondary | ICD-10-CM | POA: Diagnosis not present

## 2020-04-22 DIAGNOSIS — M1990 Unspecified osteoarthritis, unspecified site: Secondary | ICD-10-CM | POA: Diagnosis present

## 2020-04-22 DIAGNOSIS — I6529 Occlusion and stenosis of unspecified carotid artery: Secondary | ICD-10-CM | POA: Diagnosis present

## 2020-04-22 DIAGNOSIS — Z8673 Personal history of transient ischemic attack (TIA), and cerebral infarction without residual deficits: Secondary | ICD-10-CM

## 2020-04-22 HISTORY — PX: ENDARTERECTOMY: SHX5162

## 2020-04-22 LAB — CBC
HCT: 39.8 % (ref 39.0–52.0)
Hemoglobin: 13.2 g/dL (ref 13.0–17.0)
MCH: 32.3 pg (ref 26.0–34.0)
MCHC: 33.2 g/dL (ref 30.0–36.0)
MCV: 97.3 fL (ref 80.0–100.0)
Platelets: 164 10*3/uL (ref 150–400)
RBC: 4.09 MIL/uL — ABNORMAL LOW (ref 4.22–5.81)
RDW: 13.8 % (ref 11.5–15.5)
WBC: 10.5 10*3/uL (ref 4.0–10.5)
nRBC: 0 % (ref 0.0–0.2)

## 2020-04-22 LAB — CREATININE, SERUM
Creatinine, Ser: 1.42 mg/dL — ABNORMAL HIGH (ref 0.61–1.24)
GFR, Estimated: 55 mL/min — ABNORMAL LOW (ref >60)

## 2020-04-22 SURGERY — ENDARTERECTOMY, CAROTID
Anesthesia: General | Laterality: Right

## 2020-04-22 MED ORDER — PROPOFOL 10 MG/ML IV BOLUS
INTRAVENOUS | Status: AC
  Filled 2020-04-22: qty 20

## 2020-04-22 MED ORDER — PROPOFOL 10 MG/ML IV BOLUS
INTRAVENOUS | Status: DC | PRN
  Administered 2020-04-22: 160 mg via INTRAVENOUS

## 2020-04-22 MED ORDER — DOCUSATE SODIUM 100 MG PO CAPS
100.0000 mg | ORAL_CAPSULE | Freq: Every day | ORAL | Status: DC
  Administered 2020-04-23: 100 mg via ORAL
  Filled 2020-04-22: qty 1

## 2020-04-22 MED ORDER — ESCITALOPRAM OXALATE 20 MG PO TABS
20.0000 mg | ORAL_TABLET | Freq: Every day | ORAL | Status: DC
  Administered 2020-04-23: 20 mg via ORAL
  Filled 2020-04-22: qty 1

## 2020-04-22 MED ORDER — ACETAMINOPHEN 325 MG PO TABS: 325.0000 mg | ORAL_TABLET | ORAL | Status: DC | PRN

## 2020-04-22 MED ORDER — SODIUM CHLORIDE 0.9 % IV SOLN
INTRAVENOUS | Status: DC | PRN
  Administered 2020-04-22: 10:00:00 500 mL

## 2020-04-22 MED ORDER — GLYCOPYRROLATE 0.2 MG/ML IJ SOLN
INTRAMUSCULAR | Status: DC | PRN
  Administered 2020-04-22: .2 mg via INTRAVENOUS

## 2020-04-22 MED ORDER — HYDROMORPHONE HCL 1 MG/ML IJ SOLN
0.5000 mg | INTRAMUSCULAR | Status: DC | PRN
  Administered 2020-04-22: 1 mg via INTRAVENOUS
  Filled 2020-04-22: qty 1

## 2020-04-22 MED ORDER — HEPARIN SODIUM (PORCINE) 5000 UNIT/ML IJ SOLN
5000.0000 [IU] | Freq: Three times a day (TID) | INTRAMUSCULAR | Status: DC
  Administered 2020-04-23: 5000 [IU] via SUBCUTANEOUS
  Filled 2020-04-22: qty 1

## 2020-04-22 MED ORDER — DEXAMETHASONE SODIUM PHOSPHATE 10 MG/ML IJ SOLN
INTRAMUSCULAR | Status: DC | PRN
  Administered 2020-04-22: 10 mg via INTRAVENOUS

## 2020-04-22 MED ORDER — REMIFENTANIL HCL 2 MG IV SOLR
INTRAVENOUS | Status: DC | PRN
  Administered 2020-04-22: .1 ug/kg/min via INTRAVENOUS

## 2020-04-22 MED ORDER — ACETAMINOPHEN 650 MG RE SUPP
325.0000 mg | RECTAL | Status: DC | PRN
Start: 2020-04-22 — End: 2020-04-23

## 2020-04-22 MED ORDER — ROCURONIUM BROMIDE 10 MG/ML (PF) SYRINGE
PREFILLED_SYRINGE | INTRAVENOUS | Status: AC
  Filled 2020-04-22: qty 10

## 2020-04-22 MED ORDER — FENTANYL CITRATE (PF) 100 MCG/2ML IJ SOLN
INTRAMUSCULAR | Status: AC
  Filled 2020-04-22: qty 2

## 2020-04-22 MED ORDER — HYDROCHLOROTHIAZIDE 12.5 MG PO CAPS
12.5000 mg | ORAL_CAPSULE | Freq: Every day | ORAL | Status: DC
  Administered 2020-04-23: 12.5 mg via ORAL
  Filled 2020-04-22: qty 1

## 2020-04-22 MED ORDER — MIDAZOLAM HCL 2 MG/2ML IJ SOLN
INTRAMUSCULAR | Status: AC
  Filled 2020-04-22: qty 2

## 2020-04-22 MED ORDER — AMISULPRIDE (ANTIEMETIC) 5 MG/2ML IV SOLN: 10.0000 mg | Freq: Once | INTRAVENOUS | Status: DC | PRN

## 2020-04-22 MED ORDER — ONDANSETRON HCL 4 MG/2ML IJ SOLN
INTRAMUSCULAR | Status: DC | PRN
  Administered 2020-04-22: 4 mg via INTRAVENOUS

## 2020-04-22 MED ORDER — SODIUM CHLORIDE 0.9 % IV SOLN: 500.0000 mL | Freq: Once | INTRAVENOUS | Status: DC | PRN

## 2020-04-22 MED ORDER — LACTATED RINGERS IV SOLN: INTRAVENOUS | Status: DC | PRN

## 2020-04-22 MED ORDER — MAGNESIUM SULFATE 2 GM/50ML IV SOLN: 2.0000 g | Freq: Every day | INTRAVENOUS | Status: DC | PRN

## 2020-04-22 MED ORDER — PANTOPRAZOLE SODIUM 40 MG PO TBEC
40.0000 mg | DELAYED_RELEASE_TABLET | Freq: Every day | ORAL | Status: DC
  Administered 2020-04-23: 40 mg via ORAL
  Filled 2020-04-22: qty 1

## 2020-04-22 MED ORDER — METOPROLOL TARTRATE 50 MG PO TABS
50.0000 mg | ORAL_TABLET | Freq: Three times a day (TID) | ORAL | Status: DC
  Administered 2020-04-23: 50 mg via ORAL
  Filled 2020-04-22 (×2): qty 1

## 2020-04-22 MED ORDER — OXYCODONE-ACETAMINOPHEN 5-325 MG PO TABS
1.0000 | ORAL_TABLET | ORAL | Status: DC | PRN
  Administered 2020-04-22 – 2020-04-23 (×4): 2 via ORAL
  Filled 2020-04-22 (×4): qty 2

## 2020-04-22 MED ORDER — BISACODYL 5 MG PO TBEC: 5.0000 mg | DELAYED_RELEASE_TABLET | Freq: Every day | ORAL | Status: DC | PRN

## 2020-04-22 MED ORDER — ORAL CARE MOUTH RINSE: 15.0000 mL | Freq: Once | OROMUCOSAL | Status: AC

## 2020-04-22 MED ORDER — LOSARTAN POTASSIUM 50 MG PO TABS
100.0000 mg | ORAL_TABLET | Freq: Every day | ORAL | Status: DC
  Administered 2020-04-23: 100 mg via ORAL
  Filled 2020-04-22: qty 2

## 2020-04-22 MED ORDER — HYDRALAZINE HCL 20 MG/ML IJ SOLN: 5.0000 mg | INTRAMUSCULAR | Status: DC | PRN

## 2020-04-22 MED ORDER — SODIUM CHLORIDE 0.9 % IV SOLN
0.0125 ug/kg/min | INTRAVENOUS | Status: DC
  Filled 2020-04-22: qty 2000

## 2020-04-22 MED ORDER — PROTAMINE SULFATE 10 MG/ML IV SOLN
INTRAVENOUS | Status: DC | PRN
Start: 2020-04-22 — End: 2020-04-22
  Administered 2020-04-22: 50 mg via INTRAVENOUS

## 2020-04-22 MED ORDER — GLYCOPYRROLATE PF 0.2 MG/ML IJ SOSY
PREFILLED_SYRINGE | INTRAMUSCULAR | Status: AC
  Filled 2020-04-22: qty 1

## 2020-04-22 MED ORDER — LOSARTAN POTASSIUM-HCTZ 100-12.5 MG PO TABS: 1.0000 | ORAL_TABLET | Freq: Every day | ORAL | Status: DC

## 2020-04-22 MED ORDER — ICOSAPENT ETHYL 1 G PO CAPS
2.0000 g | ORAL_CAPSULE | Freq: Two times a day (BID) | ORAL | Status: DC
  Administered 2020-04-22 – 2020-04-23 (×2): 2 g via ORAL
  Filled 2020-04-22 (×3): qty 2

## 2020-04-22 MED ORDER — SODIUM CHLORIDE 0.9 % IV SOLN: INTRAVENOUS | Status: DC

## 2020-04-22 MED ORDER — 0.9 % SODIUM CHLORIDE (POUR BTL) OPTIME
TOPICAL | Status: DC | PRN
  Administered 2020-04-22 (×2): 1000 mL

## 2020-04-22 MED ORDER — GUAIFENESIN-DM 100-10 MG/5ML PO SYRP: 15.0000 mL | ORAL_SOLUTION | ORAL | Status: DC | PRN

## 2020-04-22 MED ORDER — LIDOCAINE HCL (CARDIAC) PF 100 MG/5ML IV SOSY
PREFILLED_SYRINGE | INTRAVENOUS | Status: DC | PRN
  Administered 2020-04-22: 100 mg via INTRAVENOUS

## 2020-04-22 MED ORDER — HEPARIN SODIUM (PORCINE) 1000 UNIT/ML IJ SOLN
INTRAMUSCULAR | Status: AC
  Filled 2020-04-22: qty 1

## 2020-04-22 MED ORDER — AMLODIPINE BESYLATE 10 MG PO TABS
10.0000 mg | ORAL_TABLET | Freq: Every day | ORAL | Status: DC
  Administered 2020-04-23: 10 mg via ORAL
  Filled 2020-04-22: qty 1

## 2020-04-22 MED ORDER — SENNOSIDES-DOCUSATE SODIUM 8.6-50 MG PO TABS
1.0000 | ORAL_TABLET | Freq: Every evening | ORAL | Status: DC | PRN
Start: 2020-04-22 — End: 2020-04-23

## 2020-04-22 MED ORDER — SUGAMMADEX SODIUM 200 MG/2ML IV SOLN
INTRAVENOUS | Status: DC | PRN
  Administered 2020-04-22: 200 mg via INTRAVENOUS

## 2020-04-22 MED ORDER — LIDOCAINE HCL 1 % IJ SOLN
INTRAMUSCULAR | Status: AC
  Filled 2020-04-22: qty 20

## 2020-04-22 MED ORDER — ONDANSETRON HCL 4 MG/2ML IJ SOLN: 4.0000 mg | Freq: Four times a day (QID) | INTRAMUSCULAR | Status: DC | PRN

## 2020-04-22 MED ORDER — SODIUM CHLORIDE (PF) 0.9 % IJ SOLN
INTRAMUSCULAR | Status: AC
  Filled 2020-04-22: qty 10

## 2020-04-22 MED ORDER — HEPARIN SODIUM (PORCINE) 1000 UNIT/ML IJ SOLN
INTRAMUSCULAR | Status: DC | PRN
  Administered 2020-04-22: 8000 [IU] via INTRAVENOUS

## 2020-04-22 MED ORDER — ONDANSETRON HCL 4 MG/2ML IJ SOLN
INTRAMUSCULAR | Status: AC
  Filled 2020-04-22: qty 2

## 2020-04-22 MED ORDER — HYDROXYZINE HCL 25 MG PO TABS: 25.0000 mg | ORAL_TABLET | Freq: Every day | ORAL | Status: DC

## 2020-04-22 MED ORDER — ALUM & MAG HYDROXIDE-SIMETH 200-200-20 MG/5ML PO SUSP: 15.0000 mL | ORAL | Status: DC | PRN

## 2020-04-22 MED ORDER — CEFAZOLIN SODIUM-DEXTROSE 2-4 GM/100ML-% IV SOLN
INTRAVENOUS | Status: AC
  Administered 2020-04-22: 2000 mg
  Filled 2020-04-22: qty 100

## 2020-04-22 MED ORDER — ALLOPURINOL 100 MG PO TABS
100.0000 mg | ORAL_TABLET | Freq: Every day | ORAL | Status: DC
  Administered 2020-04-23: 100 mg via ORAL
  Filled 2020-04-22: qty 1

## 2020-04-22 MED ORDER — SODIUM CHLORIDE 0.9 % IV SOLN
INTRAVENOUS | Status: AC
  Filled 2020-04-22: qty 1.2

## 2020-04-22 MED ORDER — BUPROPION HCL ER (SR) 150 MG PO TB12
150.0000 mg | ORAL_TABLET | Freq: Two times a day (BID) | ORAL | Status: DC
  Administered 2020-04-22 – 2020-04-23 (×2): 150 mg via ORAL
  Filled 2020-04-22 (×2): qty 1

## 2020-04-22 MED ORDER — PROTAMINE SULFATE 10 MG/ML IV SOLN
INTRAVENOUS | Status: AC
  Filled 2020-04-22: qty 5

## 2020-04-22 MED ORDER — POTASSIUM CHLORIDE CRYS ER 20 MEQ PO TBCR: 20.0000 meq | EXTENDED_RELEASE_TABLET | Freq: Every day | ORAL | Status: DC | PRN

## 2020-04-22 MED ORDER — MIDAZOLAM HCL 2 MG/2ML IJ SOLN
INTRAMUSCULAR | Status: DC | PRN
  Administered 2020-04-22: 2 mg via INTRAVENOUS

## 2020-04-22 MED ORDER — LACTATED RINGERS IV SOLN: INTRAVENOUS | Status: DC

## 2020-04-22 MED ORDER — COLCHICINE 0.6 MG PO TABS: 0.6000 mg | ORAL_TABLET | Freq: Every day | ORAL | Status: DC | PRN

## 2020-04-22 MED ORDER — DEXAMETHASONE SODIUM PHOSPHATE 10 MG/ML IJ SOLN
INTRAMUSCULAR | Status: AC
  Filled 2020-04-22: qty 1

## 2020-04-22 MED ORDER — CELECOXIB 200 MG PO CAPS
200.0000 mg | ORAL_CAPSULE | Freq: Once | ORAL | Status: AC
  Administered 2020-04-22: 200 mg via ORAL
  Filled 2020-04-22: qty 1

## 2020-04-22 MED ORDER — LABETALOL HCL 5 MG/ML IV SOLN: 10.0000 mg | INTRAVENOUS | Status: DC | PRN

## 2020-04-22 MED ORDER — ROCURONIUM 10MG/ML (10ML) SYRINGE FOR MEDFUSION PUMP - OPTIME
INTRAVENOUS | Status: DC | PRN
  Administered 2020-04-22: 50 mg via INTRAVENOUS

## 2020-04-22 MED ORDER — ASPIRIN 81 MG PO CHEW
81.0000 mg | CHEWABLE_TABLET | Freq: Every day | ORAL | Status: DC
  Administered 2020-04-23: 81 mg via ORAL
  Filled 2020-04-22: qty 1

## 2020-04-22 MED ORDER — METOPROLOL TARTRATE 5 MG/5ML IV SOLN: 2.0000 mg | INTRAVENOUS | Status: DC | PRN

## 2020-04-22 MED ORDER — FENTANYL CITRATE (PF) 100 MCG/2ML IJ SOLN
25.0000 ug | INTRAMUSCULAR | Status: DC | PRN
  Administered 2020-04-22: 50 ug via INTRAVENOUS

## 2020-04-22 MED ORDER — NITROGLYCERIN 0.4 MG SL SUBL: 0.4000 mg | SUBLINGUAL_TABLET | SUBLINGUAL | Status: DC | PRN

## 2020-04-22 MED ORDER — TRAZODONE HCL 100 MG PO TABS
100.0000 mg | ORAL_TABLET | Freq: Every day | ORAL | Status: DC
Start: 2020-04-22 — End: 2020-04-23
  Administered 2020-04-22: 100 mg via ORAL
  Filled 2020-04-22: qty 1

## 2020-04-22 MED ORDER — SUFENTANIL CITRATE 50 MCG/ML IV SOLN
INTRAVENOUS | Status: AC
  Filled 2020-04-22: qty 1

## 2020-04-22 MED ORDER — CHLORHEXIDINE GLUCONATE 0.12 % MT SOLN
15.0000 mL | Freq: Once | OROMUCOSAL | Status: AC
  Administered 2020-04-22: 15 mL via OROMUCOSAL
  Filled 2020-04-22: qty 15

## 2020-04-22 MED ORDER — PHENOL 1.4 % MT LIQD: 1.0000 | OROMUCOSAL | Status: DC | PRN

## 2020-04-22 MED ORDER — CEFAZOLIN SODIUM-DEXTROSE 2-4 GM/100ML-% IV SOLN
2.0000 g | Freq: Three times a day (TID) | INTRAVENOUS | Status: AC
  Administered 2020-04-22 – 2020-04-23 (×2): 2 g via INTRAVENOUS
  Filled 2020-04-22 (×2): qty 100

## 2020-04-22 MED ORDER — FLEET ENEMA 7-19 GM/118ML RE ENEM: 1.0000 | ENEMA | Freq: Once | RECTAL | Status: DC | PRN

## 2020-04-22 MED ORDER — ACETAMINOPHEN 500 MG PO TABS
1000.0000 mg | ORAL_TABLET | Freq: Once | ORAL | Status: AC
  Administered 2020-04-22: 1000 mg via ORAL
  Filled 2020-04-22: qty 2

## 2020-04-22 MED ORDER — PHENYLEPHRINE HCL-NACL 10-0.9 MG/250ML-% IV SOLN
INTRAVENOUS | Status: DC | PRN
  Administered 2020-04-22: 50 ug/min via INTRAVENOUS

## 2020-04-22 SURGICAL SUPPLY — 44 items
CANISTER SUCT 3000ML PPV (MISCELLANEOUS) ×2 IMPLANT
CANNULA VESSEL 3MM 2 BLNT TIP (CANNULA) ×4 IMPLANT
CATH ROBINSON RED A/P 18FR (CATHETERS) ×2 IMPLANT
CATH SUCT 10FR WHISTLE TIP (CATHETERS) ×2 IMPLANT
CLIP LIGATING EXTRA MED SLVR (CLIP) ×2 IMPLANT
CLIP LIGATING EXTRA SM BLUE (MISCELLANEOUS) ×2 IMPLANT
COVER WAND RF STERILE (DRAPES) IMPLANT
DECANTER SPIKE VIAL GLASS SM (MISCELLANEOUS) IMPLANT
DERMABOND ADVANCED (GAUZE/BANDAGES/DRESSINGS) ×1
DERMABOND ADVANCED .7 DNX12 (GAUZE/BANDAGES/DRESSINGS) ×1 IMPLANT
DRAIN CHANNEL 10F 3/8 F FF (DRAIN) IMPLANT
DRAIN HEMOVAC 1/8 X 5 (WOUND CARE) IMPLANT
ELECT REM PT RETURN 9FT ADLT (ELECTROSURGICAL) ×2
ELECTRODE REM PT RTRN 9FT ADLT (ELECTROSURGICAL) ×1 IMPLANT
EVACUATOR SILICONE 100CC (DRAIN) IMPLANT
GLOVE SS BIOGEL STRL SZ 7.5 (GLOVE) ×1 IMPLANT
GLOVE SUPERSENSE BIOGEL SZ 7.5 (GLOVE) ×1
GLOVE SURG SS PI 6.5 STRL IVOR (GLOVE) ×2 IMPLANT
GLOVE SURG UNDER POLY LF SZ6.5 (GLOVE) ×2 IMPLANT
GOWN STRL REUS W/ TWL LRG LVL3 (GOWN DISPOSABLE) ×3 IMPLANT
GOWN STRL REUS W/TWL LRG LVL3 (GOWN DISPOSABLE) ×6
KIT BASIN OR (CUSTOM PROCEDURE TRAY) ×2 IMPLANT
KIT SHUNT ARGYLE CAROTID ART 6 (VASCULAR PRODUCTS) IMPLANT
KIT TURNOVER KIT B (KITS) ×2 IMPLANT
NEEDLE 22X1 1/2 (OR ONLY) (NEEDLE) IMPLANT
NS IRRIG 1000ML POUR BTL (IV SOLUTION) ×4 IMPLANT
PACK CAROTID (CUSTOM PROCEDURE TRAY) ×2 IMPLANT
PAD ARMBOARD 7.5X6 YLW CONV (MISCELLANEOUS) ×4 IMPLANT
PATCH HEMASHIELD 8X75 (Vascular Products) ×2 IMPLANT
POSITIONER HEAD DONUT 9IN (MISCELLANEOUS) ×2 IMPLANT
SHUNT CAROTID BYPASS 10 (VASCULAR PRODUCTS) IMPLANT
SHUNT CAROTID BYPASS 12FRX15.5 (VASCULAR PRODUCTS) IMPLANT
SUT ETHILON 3 0 PS 1 (SUTURE) IMPLANT
SUT PROLENE 6 0 CC (SUTURE) ×2 IMPLANT
SUT SILK 3 0 (SUTURE)
SUT SILK 3-0 18XBRD TIE 12 (SUTURE) IMPLANT
SUT VIC AB 3-0 SH 27 (SUTURE) ×4
SUT VIC AB 3-0 SH 27X BRD (SUTURE) ×2 IMPLANT
SUT VIC AB 4-0 PS2 18 (SUTURE) ×2 IMPLANT
SUT VICRYL 4-0 PS2 18IN ABS (SUTURE) IMPLANT
SYR BULB IRRIG 60ML STRL (SYRINGE) ×2 IMPLANT
SYR CONTROL 10ML LL (SYRINGE) IMPLANT
TOWEL GREEN STERILE (TOWEL DISPOSABLE) ×2 IMPLANT
WATER STERILE IRR 1000ML POUR (IV SOLUTION) ×2 IMPLANT

## 2020-04-22 NOTE — Progress Notes (Signed)
  Day of Surgery Note    Subjective:  Neck is "sore"  Patient seen in PACU.  Vitals:   04/22/20 1220 04/22/20 1235  BP: 125/73 112/67  Pulse: (!) 50 (!) 51  Resp: 19 17  Temp:    SpO2: 93% 92%    Incisions:   Well approximated without bleeding or hematoma Extremities:  Moves all well. 5/5 bil hand grip strength Cardiac:  RRR Lungs:  nonlabored respirations Neuro: alert and oriented; speech clear, tongue midline   Assessment/Plan:  This is a 65 y.o. male who is s/p right CEA. Hemodynamically stable. No pain  -To 4E   Risa Grill, PA-C 04/22/2020 1:03 PM [phone]

## 2020-04-22 NOTE — Op Note (Signed)
   OPERATIVE REPORT  DATE OF SURGERY: 04/22/2020  PATIENT: [name], 65 y.o. male MRN: [id number]  DOB: [birth year]  PRE-OPERATIVE DIAGNOSIS: Right Carotid Stenosis, Asymptomatic  POST-OPERATIVE DIAGNOSIS:  Same  PROCEDURE:  Right Carotid Endarterectomy with Dacron Patch Angioplasty  SURGEON:  [name], M.D.  PHYSICIAN ASSISTANT: Dr Scot Dock, Theda Sers PA C  The assistant was needed for exposure and to expedite the case  ANESTHESIA:   general  EBL: Less than 200 ml  Total I/O In: 800 [I.V.:800] Out: -   BLOOD ADMINISTERED: none  DRAINS: none   SPECIMEN: none  COUNTS CORRECT:  YES  PLAN OF CARE: Admit to inpatient   PATIENT DISPOSITION:  PACU - hemodynamically stable and neurologically intact.  PROCEDURE DETAILS: The patient was taken to the operating room placed in supine position.  General anesthesia was administered.  The neck was prepped and draped in the usual sterile fashion.  An incision was made anterior to the sternocleidomastoid and carried down through the platysma with electrocautery.  The sternocleidomastoid was reflected posteriorly and the carotid sheath was opened.  The facial vein was ligated with 2-0 silk ties and divided.  The common carotid artery was encircled with an umbilical tape and Rummel tourniquet.  The vagus nerve was identified and preserved.  Dissection was continued onto the carotid bifurcation.  The superior thyroid artery was encircled with a 2-0 silk Potts tie.  The external carotid was encircled with a blue vessel loop and the internal carotid was encircled with an umbilical tape and Rummel tourniquet.  The hypoglossal nerve was identified and preserved.  The patient was given systemic heparin and after adequate circulation time, the internal, external and common carotid arteries were occluded with vascular clamps.  The common carotid artery was opened with an 11 blade and extended  longitudinally with Potts scissors.. The patient had  excellent backbleeding therefore a shunt was not used.  The endarterectomy was begun on the common carotid artery and the plaque was divided proximally with Potts scissors.  The endarterectomy was continued onto the bifurcation.  The external carotid was endarterectomized with an eversion technique and the internal carotid was endarterectomized in an open fashion.  Remaining atheromatous debris was removed from the endarterectomy plane.  A Finesse Hemashield Dacron patch was brought onto the field and was sewn as a patch angioplasty with a running 6-0 Prolene suture.  .  The anastomosis was completed and flow was restored first to the external and then the internal carotid artery.  Excellent flow characteristics were noted with hand-held Doppler in the internal and external carotid arteries.  The patient was given 50 mg of protamine to reverse the heparin.  The wounds were irrigated with saline.  Hemostasis was obtained with electrocautery.  The wounds were closed with 3-0 Vicryl to reapproximate the sternocleidomastoid over the carotid sheath.  The platysma was lysed with a running 3-0 Vicryl suture.  The skin was closed with a 4-0 subcuticular Vicryl stitch.  Dermabond was applied.  The patient was awakened neurologically intact in the operating room and transferred to the recovery room in stable condition   [name], M.D. 04/22/2020 11:24 AM

## 2020-04-22 NOTE — H&P (View-Only) (Signed)
Patient ID: [name], male   DOB: 1956-02-07, 65 y.o.   MRN: [id number]  Office Visit  03/31/2020 [facility]   Oluwadamilola Deliz, Arvilla Meres, MD   Vascular Surgery  Bilateral carotid artery stenosis   Dx  Follow-up; Referred by [name], MD   Reason for Visit    Additional Documentation  Vitals:  BP 135/93Important    Pulse 64   Temp 97.5 F (36.4 C)Important (Other (Comment))   Resp 18   Ht 5\' 6"  (1.676 m)   Wt 83 kg   SpO2 96%   BMI 29.54 kg/m   BSA 1.97 m       More Vitals   Flowsheets:  Anthropometrics,   Infectious Disease Screening,   MEWS Score,   NEWS,   Vital Signs,   Method of Visit     Encounter Info:  Billing Info,   History,   Allergies,   Detailed Report      All Notes   Progress Notes by Rosetta Posner, MD at 03/31/2020 3:40 PM  Author: Rosetta Posner, MD Author Type: Physician Filed: 03/31/2020 3:37 PM  Note Status: Signed Cosign: Cosign Not Required Encounter Date: 03/31/2020  Editor: Rosetta Posner, MD (Physician)                                                 [facility]  Patient name: [name]    MRN: [id number]        DOB: 12-26-1955        Sex: male  REASON FOR VISIT: Follow-up left carotid endarterectomy 03/04/2020  HPI: [name] is a 65 y.o. male here today for follow-up.  He was found to have severe bilateral asymptomatic internal carotid artery stenosis.  I recommend staged endarterectomies.  He is right-handed therefore underwent left carotid endarterectomy on 03/04/2020.  He did well and was discharged home on postoperative day 1.  He is here today for follow-up.  He has had no postoperative difficulties.  Specifically has had no neurologic deficits.  He has had minimal discomfort with his neck incision and has the typical periincisional numbness        Current Outpatient Medications  Medication Sig Dispense Refill  . allopurinol  (ZYLOPRIM) 100 MG tablet Take 100 mg by mouth daily.    Marland Kitchen amLODipine (NORVASC) 10 MG tablet Take 10 mg by mouth daily.     Marland Kitchen apixaban (ELIQUIS) 5 MG TABS tablet Take 1 tablet (5 mg total) by mouth 2 (two) times daily. 60 tablet 2  . aspirin 81 MG chewable tablet Chew 1 tablet (81 mg total) by mouth daily. 30 tablet 1  . atorvastatin (LIPITOR) 80 MG tablet TAKE 1 TABLET BY MOUTH DAILY (Patient taking differently: Take 80 mg by mouth every evening.) 90 tablet 3  . colchicine 0.6 MG tablet Take 0.6 mg by mouth daily as needed.    Marland Kitchen escitalopram (LEXAPRO) 20 MG tablet Take 20 mg by mouth daily.    . hydrOXYzine (ATARAX/VISTARIL) 25 MG tablet Take 25 mg by mouth at bedtime.    Marland Kitchen losartan-hydrochlorothiazide (HYZAAR) 100-12.5 MG tablet Take 1 tablet by mouth daily.     . metoprolol tartrate (LOPRESSOR) 50 MG tablet Take 1 tablet (50 mg total) by mouth 3 (three) times daily. (Patient taking differently: Take 50 mg  by mouth in the morning, at noon, and at bedtime.) 90 tablet 1  . nitroGLYCERIN (NITROSTAT) 0.4 MG SL tablet Place 1 tablet (0.4 mg total) under the tongue every 5 (five) minutes as needed. For chest pain (Patient taking differently: Place 0.4 mg under the tongue every 5 (five) minutes as needed for chest pain.) 30 tablet 0  . traZODone (DESYREL) 100 MG tablet Take 100 mg by mouth at bedtime.    Marland Kitchen VASCEPA 1 g capsule TAKE 2 CAPSULES(2 GRAMS) BY MOUTH TWICE DAILY (Patient taking differently: Take 2 g by mouth 2 (two) times daily.) 120 capsule 3   No current facility-administered medications for this visit.     PHYSICAL EXAM:    Vitals:   03/31/20 1519  BP: (!) 135/93  Pulse: 64  Resp: 18  Temp: (!) 97.5 F (36.4 C)  TempSrc: Other (Comment)  SpO2: 96%  Weight: 183 lb (83 kg)  Height: 5\' 6"  (1.676 m)    GENERAL: The patient is a well-nourished male, in no acute distress. The vital signs are documented above. Left neck incision well-healed.  No carotid bruits  bilaterally Neurologically he is grossly intact  MEDICAL ISSUES: Stable status post left carotid endarterectomy.  We will schedule right carotid endarterectomy for severe asymptomatic disease   Rosetta Posner, MD FACS [facility] Tel [phone]       Addendum:  The patient has been re-examined and re-evaluated.  The patient's history and physical has been reviewed and is unchanged.    [name] is a 65 y.o. male is being admitted with RIGHT CAROTID ARTERY STENOSIS. All the risks, benefits and other treatment options have been discussed with the patient. The patient has consented to proceed with Procedure(s): RIGHT CAROTID ENDARTERECTOMY as a surgical intervention.  [name] 04/22/2020 8:25 AM Vascular and Vein Surgery       [facility] Provider CC Chart Rep sent to [name], MD and [name], MD

## 2020-04-22 NOTE — Progress Notes (Addendum)
This RN gave report to 4E RN-- pt had ready bed and RN received report, this RN and pt were about to leave PACU when 4E called and stated that the pt's room has been taken for another pt. Pt has been on hold since 1250 and has been out of surgery since 1110-- at 1530 pt wants a drink (since 4hrs of NPO are over then)-- HR and SpO2 being monitored.  Pt is outside the 6hr mark of in PACU-- VS will be obtained every hour instead of 32min.

## 2020-04-22 NOTE — Discharge Instructions (Signed)
   [facility] [place]  Discharge Instructions   Carotid Endarterectomy (CEA)  Please refer to the following instructions for your post-procedure care. Your surgeon or physician assistant will discuss any changes with you.  Activity  You are encouraged to walk as much as you can. You can slowly return to normal activities but must avoid strenuous activity and heavy lifting until your doctor tell you it's OK. Avoid activities such as vacuuming or swinging a golf club. You can drive after one week if you are comfortable and you are no longer taking prescription pain medications. It is normal to feel tired for serval weeks after your surgery. It is also normal to have difficulty with sleep habits, eating, and bowel movements after surgery. These will go away with time.  Bathing/Showering  You may shower after you come home. Do not soak in a bathtub, hot tub, or swim until the incision heals completely.  Incision Care  Shower every day. Clean your incision with mild soap and water. Pat the area dry with a clean towel. You do not need a bandage unless otherwise instructed. Do not apply any ointments or creams to your incision. You may have skin glue on your incision. Do not peel it off. It will come off on its own in about one week. Your incision may feel thickened and raised for several weeks after your surgery. This is normal and the skin will soften over time. For Men Only: It's OK to shave around the incision but do not shave the incision itself for 2 weeks. It is common to have numbness under your chin that could last for several months.  Diet  Resume your normal diet. There are no special food restrictions following this procedure. A low fat/low cholesterol diet is recommended for all patients with vascular disease. In order to heal from your surgery, it is CRITICAL to get adequate nutrition. Your body requires vitamins, minerals, and protein. Vegetables are the best  source of vitamins and minerals. Vegetables also provide the perfect balance of protein. Processed food has little nutritional value, so try to avoid this.        Medications  Resume taking all of your medications unless your doctor or physician assistant tells you not to. If your incision is causing pain, you may take over-the- counter pain relievers such as acetaminophen (Tylenol). If you were prescribed a stronger pain medication, please be aware these medications can cause nausea and constipation. Prevent nausea by taking the medication with a snack or meal. Avoid constipation by drinking plenty of fluids and eating foods with a high amount of fiber, such as fruits, vegetables, and grains. Do not take Tylenol if you are taking prescription pain medications.  Follow Up  Our office will schedule a follow up appointment 2-3 weeks following discharge.  Please call us immediately for any of the following conditions  Increased pain, redness, drainage (pus) from your incision site. Fever of 101 degrees or higher. If you should develop stroke (slurred speech, difficulty swallowing, weakness on one side of your body, loss of vision) you should call 911 and go to the nearest emergency room.  Reduce your risk of vascular disease:  Stop smoking. If you would like help call [facility] at 1-800-QUIT-NOW (1-800-784-8669) or [place] at 336-586-4000. Manage your cholesterol Maintain a desired weight Control your diabetes Keep your blood pressure down  If you have any questions, please call the office at [phone].   

## 2020-04-22 NOTE — Progress Notes (Signed)
Pt arrived from unit from PACU.  SB/ VSS.Will continue to monitor. Pt. Oriented to unit . Neck incision skin glue with mild edema. CHG given  Phoebe Sharps, RN

## 2020-04-22 NOTE — Transfer of Care (Signed)
Immediate Anesthesia Transfer of Care Note  Patient: [name]  Procedure(s) Performed: RIGHT CAROTID ENDARTERECTOMY (Right )  Patient Location: PACU  Anesthesia Type:General  Level of Consciousness: awake, alert , oriented and patient cooperative  Airway & Oxygen Therapy: Patient Spontanous Breathing and Patient connected to nasal cannula oxygen  Post-op Assessment: Report given to RN, Post -op Vital signs reviewed and stable, Patient moving all extremities, Patient moving all extremities X 4 and Patient able to stick tongue midline  Post vital signs: Reviewed and stable  Last Vitals:  Vitals Value Taken Time  BP 165/79 04/22/20 1121  Temp    Pulse 50 04/22/20 1127  Resp 21 04/22/20 1127  SpO2 99 % 04/22/20 1127  Vitals shown include unvalidated device data.  Last Pain:  Vitals:   04/22/20 0818  TempSrc:   PainSc: 0-No pain      Patients Stated Pain Goal: 3 (00/34/91 7915)  Complications: No complications documented.

## 2020-04-22 NOTE — Progress Notes (Signed)
Patient ID: [name], male   DOB: 06/29/1955, 65 y.o.   MRN: [id number]  Office Visit  03/31/2020 [facility]   Early, Todd F, MD   Vascular Surgery  Bilateral carotid artery stenosis   Dx  Follow-up; Referred by Avbuere, Edwin, MD   Reason for Visit    Additional Documentation  Vitals:  BP 135/93Important    Pulse 64   Temp 97.5 F (36.4 C)Important (Other (Comment))   Resp 18   Ht 5' 6" (1.676 m)   Wt 83 kg   SpO2 96%   BMI 29.54 kg/m   BSA 1.97 m       More Vitals   Flowsheets:  Anthropometrics,   Infectious Disease Screening,   MEWS Score,   NEWS,   Vital Signs,   Method of Visit     Encounter Info:  Billing Info,   History,   Allergies,   Detailed Report      All Notes   Progress Notes by Early, Todd F, MD at 03/31/2020 3:40 PM  Author: Early, Todd F, MD Author Type: Physician Filed: 03/31/2020 3:37 PM  Note Status: Signed Cosign: Cosign Not Required Encounter Date: 03/31/2020  Editor: Early, Todd F, MD (Physician)                                                 [facility] [place]  Patient name: [name]    MRN: [id number]        DOB: 04/30/1955        Sex: male  REASON FOR VISIT: Follow-up left carotid endarterectomy 03/04/2020  HPI: [name] is a 65 y.o. male here today for follow-up.  He was found to have severe bilateral asymptomatic internal carotid artery stenosis.  I recommend staged endarterectomies.  He is right-handed therefore underwent left carotid endarterectomy on 03/04/2020.  He did well and was discharged home on postoperative day 1.  He is here today for follow-up.  He has had no postoperative difficulties.  Specifically has had no neurologic deficits.  He has had minimal discomfort with his neck incision and has the typical periincisional numbness        Current Outpatient Medications  Medication Sig Dispense Refill  . allopurinol  (ZYLOPRIM) 100 MG tablet Take 100 mg by mouth daily.    . amLODipine (NORVASC) 10 MG tablet Take 10 mg by mouth daily.     . apixaban (ELIQUIS) 5 MG TABS tablet Take 1 tablet (5 mg total) by mouth 2 (two) times daily. 60 tablet 2  . aspirin 81 MG chewable tablet Chew 1 tablet (81 mg total) by mouth daily. 30 tablet 1  . atorvastatin (LIPITOR) 80 MG tablet TAKE 1 TABLET BY MOUTH DAILY (Patient taking differently: Take 80 mg by mouth every evening.) 90 tablet 3  . colchicine 0.6 MG tablet Take 0.6 mg by mouth daily as needed.    . escitalopram (LEXAPRO) 20 MG tablet Take 20 mg by mouth daily.    . hydrOXYzine (ATARAX/VISTARIL) 25 MG tablet Take 25 mg by mouth at bedtime.    . losartan-hydrochlorothiazide (HYZAAR) 100-12.5 MG tablet Take 1 tablet by mouth daily.     . metoprolol tartrate (LOPRESSOR) 50 MG tablet Take 1 tablet (50 mg total) by mouth 3 (three) times daily. (Patient taking differently: Take 50 mg   by mouth in the morning, at noon, and at bedtime.) 90 tablet 1  . nitroGLYCERIN (NITROSTAT) 0.4 MG SL tablet Place 1 tablet (0.4 mg total) under the tongue every 5 (five) minutes as needed. For chest pain (Patient taking differently: Place 0.4 mg under the tongue every 5 (five) minutes as needed for chest pain.) 30 tablet 0  . traZODone (DESYREL) 100 MG tablet Take 100 mg by mouth at bedtime.    Marland Kitchen VASCEPA 1 g capsule TAKE 2 CAPSULES(2 GRAMS) BY MOUTH TWICE DAILY (Patient taking differently: Take 2 g by mouth 2 (two) times daily.) 120 capsule 3   No current facility-administered medications for this visit.     PHYSICAL EXAM:    Vitals:   03/31/20 1519  BP: (!) 135/93  Pulse: 64  Resp: 18  Temp: (!) 97.5 F (36.4 C)  TempSrc: Other (Comment)  SpO2: 96%  Weight: 183 lb (83 kg)  Height: 5\' 6"  (1.676 m)    GENERAL: The patient is a well-nourished male, in no acute distress. The vital signs are documented above. Left neck incision well-healed.  No carotid bruits  bilaterally Neurologically he is grossly intact  MEDICAL ISSUES: Stable status post left carotid endarterectomy.  We will schedule right carotid endarterectomy for severe asymptomatic disease   Rosetta Posner, MD FACS [facility] Tel [phone]       Addendum:  The patient has been re-examined and re-evaluated.  The patient's history and physical has been reviewed and is unchanged.    [name] is a 65 y.o. male is being admitted with RIGHT CAROTID ARTERY STENOSIS. All the risks, benefits and other treatment options have been discussed with the patient. The patient has consented to proceed with Procedure(s): RIGHT CAROTID ENDARTERECTOMY as a surgical intervention.  Todd Early 04/22/2020 8:25 AM Vascular and Vein Surgery       [facility] Provider CC Chart Rep sent to [name], MD and [name], MD

## 2020-04-22 NOTE — Anesthesia Postprocedure Evaluation (Signed)
Anesthesia Post Note  Patient: [name]  Procedure(s) Performed: RIGHT CAROTID ENDARTERECTOMY (Right )     Patient location during evaluation: PACU Anesthesia Type: General Level of consciousness: sedated Pain management: pain level controlled Vital Signs Assessment: post-procedure vital signs reviewed and stable Respiratory status: spontaneous breathing and respiratory function stable Cardiovascular status: stable Postop Assessment: no apparent nausea or vomiting Anesthetic complications: no   No complications documented.  Last Vitals:  Vitals:   04/22/20 1220 04/22/20 1235  BP: 125/73 112/67  Pulse: (!) 50 (!) 51  Resp: 19 17  Temp:    SpO2: 93% 92%    Last Pain:  Vitals:   04/22/20 1220  TempSrc:   PainSc: Asleep                 Makeda Peeks DANIEL

## 2020-04-22 NOTE — Anesthesia Procedure Notes (Signed)
Procedure Name: Intubation Date/Time: 04/22/2020 9:18 AM Performed by: Claris Che, CRNA Pre-anesthesia Checklist: Patient identified, Emergency Drugs available, Suction available, Patient being monitored and Timeout performed Patient Re-evaluated:Patient Re-evaluated prior to induction Oxygen Delivery Method: Circle system utilized Preoxygenation: Pre-oxygenation with 100% oxygen Induction Type: Cricoid Pressure applied Ventilation: Mask ventilation without difficulty Laryngoscope Size: Mac and 4 Grade View: Grade II Tube type: Oral Tube size: 7.5 mm Number of attempts: 1 Airway Equipment and Method: Stylet Placement Confirmation: ETT inserted through vocal cords under direct vision,  positive ETCO2 and breath sounds checked- equal and bilateral Secured at: 23 cm Tube secured with: Tape Dental Injury: Teeth and Oropharynx as per pre-operative assessment

## 2020-04-23 ENCOUNTER — Encounter (HOSPITAL_COMMUNITY): Payer: Self-pay | Admitting: Vascular Surgery

## 2020-04-23 LAB — BASIC METABOLIC PANEL
Anion gap: 9 (ref 5–15)
BUN: 24 mg/dL — ABNORMAL HIGH (ref 8–23)
CO2: 22 mmol/L (ref 22–32)
Calcium: 8.5 mg/dL — ABNORMAL LOW (ref 8.9–10.3)
Chloride: 104 mmol/L (ref 98–111)
Creatinine, Ser: 1.54 mg/dL — ABNORMAL HIGH (ref 0.61–1.24)
GFR, Estimated: 50 mL/min — ABNORMAL LOW (ref >60)
Glucose, Bld: 157 mg/dL — ABNORMAL HIGH (ref 70–99)
Potassium: 4.3 mmol/L (ref 3.5–5.1)
Sodium: 135 mmol/L (ref 135–145)

## 2020-04-23 LAB — CBC
HCT: 35.7 % — ABNORMAL LOW (ref 39.0–52.0)
Hemoglobin: 12.5 g/dL — ABNORMAL LOW (ref 13.0–17.0)
MCH: 33.7 pg (ref 26.0–34.0)
MCHC: 35 g/dL (ref 30.0–36.0)
MCV: 96.2 fL (ref 80.0–100.0)
Platelets: 168 10*3/uL (ref 150–400)
RBC: 3.71 MIL/uL — ABNORMAL LOW (ref 4.22–5.81)
RDW: 13.7 % (ref 11.5–15.5)
WBC: 12.8 10*3/uL — ABNORMAL HIGH (ref 4.0–10.5)
nRBC: 0 % (ref 0.0–0.2)

## 2020-04-23 MED ORDER — HYDROCODONE-ACETAMINOPHEN 5-325 MG PO TABS: 1.0000 | ORAL_TABLET | ORAL | 0 refills | Status: DC | PRN

## 2020-04-23 NOTE — Discharge Summary (Signed)
Carotid Discharge Summary     [name] February 17, 1956 65 y.o. male  [id number]  Admission Date: 04/22/2020  Discharge Date: 04/23/2020  Physician: Evan Posner, MD  Admission Diagnosis: Carotid stenosis [name] Course:  The patient was admitted to the hospital and taken to the operating room on 04/22/2020 and underwent left carotid endarterectomy.  The pt tolerated the procedure well and was transported to the PACU in good condition.   By POD 1, the pt neuro status remained intact. Some surgical site discomfort but overall pain well controlled. Tolerating diet. Voiding without difficulty. Hemodynamically stable. Vital signs within normal limits.  The remainder of the hospital course consisted of increasing mobilization and increasing intake of solids without difficulty.  He remained stable to discharge home. He will go home on Aspirin and Statin. He otherwise will resume home medications as prescribed. PDMP was reviewed and post operative pain medications were sent to patients requested pharmacy. He will follow up with Dr. Donnetta [name] in 2-3 weeks   Recent Labs    04/23/20 0241  NA 135  K 4.3  CL 104  CO2 22  GLUCOSE 157*  BUN 24*  CALCIUM 8.5*   Recent Labs    04/22/20 1154 04/23/20 0241  WBC 10.5 12.8*  HGB 13.2 12.5*  HCT 39.8 35.7*  PLT 164 168   No results for input(s): INR in the last 72 hours.   Discharge Instructions    Discharge patient   Complete by: As directed    Once patient has ambulated without issues   Discharge disposition: 01-Home or Self Care   Discharge patient date: 04/23/2020      Discharge Diagnosis:  Carotid stenosis [I65.29]  Secondary Diagnosis: Patient Active Problem List   Diagnosis Date Noted  . Carotid stenosis 04/22/2020  . Asymptomatic carotid artery stenosis without infarction, left 03/04/2020  . Elevated LFTs   . Abnormal liver function   . Rectal mass   . Blood in stool   . Anuria   . Acute renal  failure (ARF) ([place]) 01/07/2016  . Hypokalemia 01/07/2016  . Hypotension 01/07/2016  . Hypothermia 01/07/2016  . 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 History of non-ST elevation myocardial infarction (NSTEMI) 01/07/2016  . Tobacco use 01/07/2016  . Diarrhea 01/07/2016  . Dyslipidemia 01/07/2016  . Uremic acidosis   . NSTEMI (non-ST elevated myocardial infarction) ([place]) 12/18/2015   Past Medical History:  Diagnosis Date  . AKI (acute kidney injury) ([place]) 12/2015  . Anxiety   . Arthritis   . Carotid artery stenosis   . Coronary artery disease   . Dementia ([place])    problems with short term memory  . Depression   . Dysrhythmia    episode of afib 12/2005 in setting of cocaine use  . Gout   . Hyperlipidemia   . Hypertension   . Myocardial infarction ([place])    2017  . PAD (peripheral artery disease) ([place])   . Stroke [facility])    TIA  . TIA (transient ischemic attack)    over 20 years ago    Allergies as of 04/23/2020      Reactions   Vicodin [hydrocodone-acetaminophen] Nausea And Vomiting      Medication List    TAKE these medications   allopurinol 100 MG tablet Commonly known as: ZYLOPRIM Take 100 mg by mouth daily.   amLODipine 10 MG tablet Commonly known as: NORVASC Take 10 mg by mouth daily.   apixaban 5 MG Tabs tablet Commonly known as: ELIQUIS  Take 1 tablet (5 mg total) by mouth 2 (two) times daily.   aspirin 81 MG chewable tablet Chew 1 tablet (81 mg total) by mouth daily.   atorvastatin 80 MG tablet Commonly known as: LIPITOR TAKE 1 TABLET BY MOUTH DAILY   buPROPion 150 MG 12 hr tablet Commonly known as: Wellbutrin SR Take 1 tablet (150 mg total) by mouth 2 (two) times daily. Start one tab daily for 3 days. Then take morning and afternoon   colchicine 0.6 MG tablet Take 0.6 mg by mouth daily as needed (gout flare).   escitalopram 20 MG tablet Commonly known as: LEXAPRO Take 20 mg by mouth daily.   HYDROcodone-acetaminophen 5-325 MG  tablet Commonly known as: NORCO/VICODIN Take 1 tablet by mouth every 4 (four) hours as needed for moderate pain.   hydrOXYzine 25 MG tablet Commonly known as: ATARAX/VISTARIL Take 25 mg by mouth at bedtime.   losartan-hydrochlorothiazide 100-12.5 MG tablet Commonly known as: HYZAAR Take 1 tablet by mouth daily.   metoprolol tartrate 50 MG tablet Commonly known as: LOPRESSOR Take 1 tablet (50 mg total) by mouth 3 (three) times daily. What changed: when to take this   nitroGLYCERIN 0.4 MG SL tablet Commonly known as: NITROSTAT Place 1 tablet (0.4 mg total) under the tongue every 5 (five) minutes as needed. For chest pain What changed:   reasons to take this  additional instructions   traZODone 100 MG tablet Commonly known as: DESYREL Take 100 mg by mouth at bedtime.   Vascepa 1 g capsule Generic drug: icosapent Ethyl TAKE 2 CAPSULES(2 GRAMS) BY MOUTH TWICE DAILY What changed: See the new instructions.        Discharge Instructions:   [facility] Discharge Instructions Carotid Endarterectomy (CEA)  Please refer to the following instructions for your post-procedure care. Your surgeon or physician assistant will discuss any changes with you.  Activity  You are encouraged to walk as much as you can. You can slowly return to normal activities but must avoid strenuous activity and heavy lifting until your doctor tell you it's OK. Avoid activities such as vacuuming or swinging a golf club. You can drive after one week if you are comfortable and you are no longer taking prescription pain medications. It is normal to feel tired for serval weeks after your surgery. It is also normal to have difficulty with sleep habits, eating, and bowel movements after surgery. These will go away with time.  Bathing/Showering  You may shower after you come home. Do not soak in a bathtub, hot tub, or swim until the incision heals completely.  Incision  Care  Shower every day. Clean your incision with mild soap and water. Pat the area dry with a clean towel. You do not need a bandage unless otherwise instructed. Do not apply any ointments or creams to your incision. You may have skin glue on your incision. Do not peel it off. It will come off on its own in about one week. Your incision may feel thickened and raised for several weeks after your surgery. This is normal and the skin will soften over time. For Men Only: It's OK to shave around the incision but do not shave the incision itself for 2 weeks. It is common to have numbness under your chin that could last for several months.  Diet  Resume your normal diet. There are no special food restrictions following this procedure. A low fat/low cholesterol diet is recommended for all patients with  vascular disease. In order to heal from your surgery, it is CRITICAL to get adequate nutrition. Your body requires vitamins, minerals, and protein. Vegetables are the best source of vitamins and minerals. Vegetables also provide the perfect balance of protein. Processed food has little nutritional value, so try to avoid this.  Medications  Resume taking all of your medications unless your doctor or physician assistant tells you not to.  If your incision is causing pain, you may take over-the- counter pain relievers such as acetaminophen (Tylenol). If you were prescribed a stronger pain medication, please be aware these medications can cause nausea and constipation.  Prevent nausea by taking the medication with a snack or meal. Avoid constipation by drinking plenty of fluids and eating foods with a high amount of fiber, such as fruits, vegetables, and grains. Do not take Tylenol if you are taking prescription pain medications.  Follow Up  Our office will schedule a follow up appointment 2-3 weeks following discharge.  Please call us immediately for any of the following conditions  . Increased pain, redness,  drainage (pus) from your incision site. . Fever of 101 degrees or higher. . If you should develop stroke (slurred speech, difficulty swallowing, weakness on one side of your body, loss of vision) you should call 911 and go to the nearest emergency room. .  Reduce your risk of vascular disease:  . Stop smoking. If you would like help call [facility] at 1-800-QUIT-NOW 931-623-9144) or [place] at (614) 329-2467. . Manage your cholesterol . Maintain a desired weight . Control your diabetes . Keep your blood pressure down .  If you have any questions, please call the office at [phone].  Prescriptions given: Hydrocodone Acetaminophen 5-325 mg #20 No Refill  Disposition: Home  Patient's condition: is Excellent  Follow up: 1. Dr. Donnetta [name] in 2 weeks 2. Dr. Einar Gip- call to make an earlier follow up to review current medications    Jahmez Bily PA-C [facility] [phone]   --- For [facility] use ---   Modified Rankin score at D/C (0-6): 0  IV medication needed for:  1. Hypertension: No 2. Hypotension: No  Post-op Complications: No  1. Post-op CVA or TIA: No  If yes: Event classification (right eye, left eye, right cortical, left cortical, verterobasilar, other): N/a  If yes: Timing of event (intra-op, <6 hrs post-op, >=6 hrs post-op, unknown): N/a  2. CN injury: No  If yes: CN not injuried   3. Myocardial infarction: No  If yes: Dx by (EKG or clinical, Troponin): n/a  4.  CHF: No  5.  Dysrhythmia (new): No  6. Wound infection: No  7. Reperfusion symptoms: No  8. Return to OR: No  If yes: return to OR for (bleeding, neurologic, other CEA incision, other): n/a  Discharge medications: Statin use:  Yes ASA use:  Yes   Beta blocker use:  No ACE-Inhibitor use:  No  ARB use:  Yes CCB use: Yes P2Y12 Antagonist use: No, [ ]  Plavix, [ ]  Plasugrel, [ ]  Ticlopinine, [ ]  Ticagrelor, [ ]  Other, [ ]  No for medical reason, [ ]   Non-compliant, [ ]  Not-indicated Anti-coagulant use:  Yes, [ ]  Warfarin, [ ]  Rivaroxaban, [ ]  Dabigatran, [X]  Apixaban

## 2020-04-23 NOTE — Progress Notes (Signed)
Pt HR 47 after receiving scheduled metoprolol this a.m.  He states that his baseline lately has been 40-50s and been taking his betablocker TID.  Ambulated with patient, minimal assist, HR up to 60s, he tolerated walk very well, 470' with rolling walker, although he seemed as if he would have been fine without it.  Vascular PA notified of HR, received verbal orders for patient to hold metoprolol until he is seen by his cardiologist.  He has a preexisting appt scheduled for 64mo, pt understands that he is to call and reschedule for 2wks.  Other discharge instructions reviewed.  Pt expresses understanding.  IV and telemetry removed, CCMD notified.  Pt ride has been notified and should be here in about an hour.

## 2020-04-23 NOTE — Progress Notes (Addendum)
  Progress Note    04/23/2020 7:51 AM 1 Day Post-Op  Subjective:  Right jaw a little sore. States it feels like  A tooth ache   Vitals:   04/23/20 0014 04/23/20 0356  BP: 113/66 129/64  Pulse: (!) 50 65  Resp: 18 20  Temp: 97.9 F (36.6 C)   SpO2: 96% 98%   Physical Exam: Cardiac: regular Lungs: non labored Incisions:  Right neck incision is clean, dry and intact. Soft without hematoma Extremities: moving all extremities without deficit Neurologic: alert and oriented. Speech coherent. Smile symmetric. Tongue midline. Sensation intact and equal bilaterally  CBC    Component Value Date/Time   WBC 12.8 (H) 04/23/2020 0241   RBC 3.71 (L) 04/23/2020 0241   HGB 12.5 (L) 04/23/2020 0241   HCT 35.7 (L) 04/23/2020 0241   PLT 168 04/23/2020 0241   MCV 96.2 04/23/2020 0241   MCH 33.7 04/23/2020 0241   MCHC 35.0 04/23/2020 0241   RDW 13.7 04/23/2020 0241   LYMPHSABS 3.2 03/31/2016 2038   MONOABS 0.8 03/31/2016 2038   EOSABS 0.2 03/31/2016 2038   BASOSABS 0.0 03/31/2016 2038    BMET    Component Value Date/Time   NA 135 04/23/2020 0241   NA 141 07/11/2019 0812   K 4.3 04/23/2020 0241   CL 104 04/23/2020 0241   CO2 22 04/23/2020 0241   GLUCOSE 157 (H) 04/23/2020 0241   BUN 24 (H) 04/23/2020 0241   BUN 15 07/11/2019 0812   CREATININE 1.54 (H) 04/23/2020 0241   CALCIUM 8.5 (L) 04/23/2020 0241   GFRNONAA 50 (L) 04/23/2020 0241   GFRAA 87 07/11/2019 0812    INR    Component Value Date/Time   INR 1.3 (H) 04/16/2020 1530     Intake/Output Summary (Last 24 hours) at 04/23/2020 0751 Last data filed at 04/22/2020 1817 Gross per 24 hour  Intake 899.17 ml  Output 10 ml  Net 889.17 ml     Assessment/Plan:  65 y.o. male is s/p Right CEA 1 Day Post-Op. Doing well post op. Hemodynamically stable. Neurologically intact. Has some residual peri incisional numbness from prior left CEA and is having some surgical site discomfort and soreness in his jaw on the right.  Tolerating diet. Voiding without difficulty. Has not yet ambulated. Will have him mobilize this morning to see how he tolerates it. Likely will be stable for discharge later today. He will have follow up arranged in 2-3 weeks   Karoline Caldwell, PA-C [facility] [phone] 04/23/2020 7:51 AM   I have examined the patient, reviewed and agree with above.  Comfortable.  Stable for discharge to home  [name], MD 04/23/2020 11:33 AM

## 2020-04-24 ENCOUNTER — Telehealth: Payer: Self-pay

## 2020-04-24 NOTE — Telephone Encounter (Signed)
Pts wife called and stated that the pt was told to stop taking Eliqis 5 days before his Endarterectomy procedure. Now that the procedure is done, can he continue take it?

## 2020-04-24 NOTE — Telephone Encounter (Signed)
Pt's wife Hassan Rowan called for pt who has c/o pain since his surgery yesterday. He is asking for a different narcotic. Per APP, he has been advised to alternate with either extra strength ibuprofen or Tylenol for pain in between taking his narcotic. Hassan Rowan verbalized understanding. No further questions/concerns at this time.

## 2020-04-25 ENCOUNTER — Other Ambulatory Visit: Payer: Self-pay | Admitting: Physician Assistant

## 2020-04-25 ENCOUNTER — Telehealth: Payer: Self-pay

## 2020-04-25 MED ORDER — HYDROCODONE-ACETAMINOPHEN 5-325 MG PO TABS: 1.0000 | ORAL_TABLET | ORAL | 0 refills | Status: DC | PRN

## 2020-04-25 NOTE — Telephone Encounter (Signed)
Pt's wife called and said adding in OTC pain medication in between narcotics is helping but pt only has 2 pills left and would like something to get called in. APP is aware and will order more. Pt is aware if pain persists we cannot refill narcotics and he will need to return to office to be seen.

## 2020-04-26 NOTE — Telephone Encounter (Signed)
Yes

## 2020-04-28 ENCOUNTER — Encounter: Payer: Medicare (Managed Care) | Admitting: Vascular Surgery

## 2020-04-28 NOTE — Interval H&P Note (Signed)
History and Physical Interval Note:  04/28/2020 8:40 AM  [name]  has presented today for surgery, with the diagnosis of RIGHT CAROTID ARTERY STENOSIS.  The various methods of treatment have been discussed with the patient and family. After consideration of risks, benefits and other options for treatment, the patient has consented to  Procedure(s): RIGHT CAROTID ENDARTERECTOMY (Right) as a surgical intervention.  The patient's history has been reviewed, patient examined, no change in status, stable for surgery.  I have reviewed the patient's chart and labs.  Questions were answered to the patient's satisfaction.     [name]

## 2020-04-28 NOTE — Telephone Encounter (Signed)
Called and spoke with pt regarding Eliqis. Pt voiced understanding.

## 2020-05-02 ENCOUNTER — Telehealth: Payer: Self-pay

## 2020-05-02 NOTE — Telephone Encounter (Signed)
KiKi with [facility] called to report that pt had a fall on yesterday. Bruise on L cheek noted, no other injuries reported.

## 2020-05-12 ENCOUNTER — Encounter: Payer: Self-pay | Admitting: Vascular Surgery

## 2020-05-12 ENCOUNTER — Ambulatory Visit (INDEPENDENT_AMBULATORY_CARE_PROVIDER_SITE_OTHER): Payer: 59 | Admitting: Vascular Surgery

## 2020-05-12 ENCOUNTER — Other Ambulatory Visit: Payer: Self-pay

## 2020-05-12 VITALS — BP 149/78 | HR 74 | Temp 98.2°F | Resp 18 | Ht 66.0 in | Wt 184.0 lb

## 2020-05-12 DIAGNOSIS — I6523 Occlusion and stenosis of bilateral carotid arteries: Secondary | ICD-10-CM

## 2020-05-12 NOTE — Progress Notes (Signed)
[facility] [place]  Patient name: [name] MRN: [id number] DOB: 1955/09/27 Sex: male  REASON FOR VISIT: Follow-up staged bilateral carotid endarterectomy  HPI: PRATT BRESS is a 65 y.o. male here today for follow-up.  Presented with severe asymptomatic bilateral carotid disease.  He underwent initially left carotid endarterectomy and subsequently right carotid endarterectomy 3 weeks ago.  He did well with surgery and was discharged home on postoperative day 1 for each of his procedures.  He does report the typical periincisional numbness.  He has had no wound issues and has had no neurologic deficits  Current Outpatient Medications  Medication Sig Dispense Refill  . allopurinol (ZYLOPRIM) 100 MG tablet Take 100 mg by mouth daily.    Marland Kitchen amLODipine (NORVASC) 10 MG tablet Take 10 mg by mouth daily.     Marland Kitchen apixaban (ELIQUIS) 5 MG TABS tablet Take 1 tablet (5 mg total) by mouth 2 (two) times daily. 60 tablet 2  . aspirin 81 MG chewable tablet Chew 1 tablet (81 mg total) by mouth daily. 30 tablet 1  . atorvastatin (LIPITOR) 80 MG tablet TAKE 1 TABLET BY MOUTH DAILY 90 tablet 3  . buPROPion (WELLBUTRIN SR) 150 MG 12 hr tablet Take 1 tablet (150 mg total) by mouth 2 (two) times daily. Start one tab daily for 3 days. Then take morning and afternoon 60 tablet 2  . colchicine 0.6 MG tablet Take 0.6 mg by mouth daily as needed (gout flare).    Marland Kitchen escitalopram (LEXAPRO) 20 MG tablet Take 20 mg by mouth daily.    . hydrOXYzine (ATARAX/VISTARIL) 25 MG tablet Take 25 mg by mouth at bedtime.    Marland Kitchen losartan-hydrochlorothiazide (HYZAAR) 100-12.5 MG tablet Take 1 tablet by mouth daily.     . metoprolol tartrate (LOPRESSOR) 50 MG tablet Take 1 tablet (50 mg total) by mouth 3 (three) times daily. (Patient taking differently: Take 50 mg by mouth in the morning, at noon, and at bedtime.) 90 tablet 1  . nitroGLYCERIN (NITROSTAT) 0.4 MG SL tablet Place 1  tablet (0.4 mg total) under the tongue every 5 (five) minutes as needed. For chest pain (Patient taking differently: Place 0.4 mg under the tongue every 5 (five) minutes as needed for chest pain.) 30 tablet 0  . traZODone (DESYREL) 100 MG tablet Take 100 mg by mouth at bedtime.    Marland Kitchen VASCEPA 1 g capsule TAKE 2 CAPSULES(2 GRAMS) BY MOUTH TWICE DAILY (Patient taking differently: Take 2 g by mouth 2 (two) times daily.) 120 capsule 3   No current facility-administered medications for this visit.     PHYSICAL EXAM: Vitals:   05/12/20 1504  BP: (!) 149/78  Pulse: 74  Resp: 18  Temp: 98.2 F (36.8 C)  TempSrc: Other (Comment)  SpO2: 95%  Weight: 184 lb (83.5 kg)  Height: 5\' 6"  (1.676 m)    GENERAL: The patient is a well-nourished male, in no acute distress. The vital signs are documented above. Bilateral neck incisions well-healed.  No carotid bruits.  MEDICAL ISSUES: Stable status post bilateral staged carotid endarterectomies for severe asymptomatic disease.  We will continue full activities without limitation.  We will see him in our [place] office in 9 months with carotid duplex follow-up   Rosetta Posner, MD FACS [facility] [place] [facility] Tel [phone]  Note: Portions of this report may have been transcribed using voice recognition software.  Every effort has been made to ensure accuracy; however, inadvertent  computerized transcription errors may still be present.

## 2020-05-15 ENCOUNTER — Telehealth: Payer: Self-pay

## 2020-05-15 NOTE — Telephone Encounter (Signed)
Patient called regarding if he is able to restart metoprolol he had surgery and they told him to stop it please advise

## 2020-05-16 ENCOUNTER — Other Ambulatory Visit: Payer: Self-pay

## 2020-05-16 DIAGNOSIS — I6523 Occlusion and stenosis of bilateral carotid arteries: Secondary | ICD-10-CM

## 2020-05-16 NOTE — Telephone Encounter (Signed)
Spoke to patient

## 2020-05-21 ENCOUNTER — Other Ambulatory Visit: Payer: Self-pay | Admitting: Cardiology

## 2020-05-21 DIAGNOSIS — I2510 Atherosclerotic heart disease of native coronary artery without angina pectoris: Secondary | ICD-10-CM

## 2020-05-21 DIAGNOSIS — I252 Old myocardial infarction: Secondary | ICD-10-CM

## 2020-05-21 DIAGNOSIS — Z955 Presence of coronary angioplasty implant and graft: Secondary | ICD-10-CM

## 2020-06-02 ENCOUNTER — Ambulatory Visit: Payer: 59 | Admitting: Cardiology

## 2020-06-06 ENCOUNTER — Ambulatory Visit: Payer: 59 | Admitting: Student

## 2020-06-09 ENCOUNTER — Other Ambulatory Visit: Payer: Self-pay

## 2020-06-09 ENCOUNTER — Encounter: Payer: Self-pay | Admitting: Student

## 2020-06-09 ENCOUNTER — Ambulatory Visit: Payer: 59 | Admitting: Student

## 2020-06-09 VITALS — BP 108/65 | HR 60 | Temp 98.2°F | Resp 17 | Ht 66.0 in | Wt 186.8 lb

## 2020-06-09 DIAGNOSIS — I2510 Atherosclerotic heart disease of native coronary artery without angina pectoris: Secondary | ICD-10-CM

## 2020-06-09 DIAGNOSIS — I6523 Occlusion and stenosis of bilateral carotid arteries: Secondary | ICD-10-CM

## 2020-06-09 DIAGNOSIS — I480 Paroxysmal atrial fibrillation: Secondary | ICD-10-CM

## 2020-06-09 DIAGNOSIS — E782 Mixed hyperlipidemia: Secondary | ICD-10-CM

## 2020-06-09 MED ORDER — ATORVASTATIN CALCIUM 80 MG PO TABS: 80.0000 mg | ORAL_TABLET | Freq: Every day | ORAL | 3 refills | Status: DC

## 2020-06-09 NOTE — Progress Notes (Signed)
Primary Physician/Referring:  [name], MD  Patient ID: [name], male    DOB: [birth year], 65 y.o.   MRN: [id number]  Chief Complaint  Patient presents with  . Atrial Fibrillation    2 MONTHS  . CAROTID DISEASE   HPI:    [name]  is a 65 y.o. 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    Underwent left carotid endarterectomy on 03/04/2020 and was found to have postop atrial fibrillation with RVR.  Underwent successful right carotid endarterectomy 04/08/2020.   Patient presents for 81-month follow-up of atrial fibrillation.  Patient is congratulated today as he stopped smoking approximately 1 week ago.  He presents for follow-up after right carotid endarterectomy, patient has mistakenly not resumed Eliquis as directed following procedure.  He is also for an unknown reason been without atorvastatin.  Patient is presently doing well without specific complaints.  Denies chest pain, palpitations, syncope, near syncope.  Denies symptoms suggestive of TIA/CVA.  He continues to have chronic stable dyspnea.  Denies symptoms of claudication.  Past Medical History:  Diagnosis Date  . AKI (acute kidney injury) ([place]) 12/2015  . Anxiety   . Arthritis   . Carotid artery stenosis   . Coronary artery disease   . Dementia ([place])    problems with short term memory  . Depression   . Dysrhythmia    episode of afib 12/2005 in setting of cocaine use  . Gout   . Hyperlipidemia   . Hypertension   . Myocardial  infarction ([place])    2017  . PAD (peripheral artery disease) ([place])   . Stroke [facility])    TIA  . TIA (transient ischemic attack)    over 20 years ago   Past Surgical History:  Procedure Laterality Date  . CARDIAC CATHETERIZATION N/A 12/18/2015   Procedure: Left Heart Cath and Coronary Angiography;  Surgeon: Adrian Prows, MD;  Location: [facility];  Service: Cardiovascular;  Laterality: N/A;  . CARDIAC CATHETERIZATION N/A 12/18/2015   Procedure: Coronary Stent Intervention;  Surgeon: Adrian Prows, MD;  Location: [facility];  Service: Cardiovascular;  Laterality: N/A;  . CARDIAC CATHETERIZATION N/A 12/18/2015   Procedure: Coronary Balloon Angioplasty;  Surgeon: Adrian Prows, MD;  Location: [facility];  Service: Cardiovascular;  Laterality: N/A;  . COLONOSCOPY N/A 01/12/2016   Procedure: COLONOSCOPY;  Surgeon: Manus Gunning, MD;  Location: [facility];  Service: Gastroenterology;  Laterality: N/A;  . ENDARTERECTOMY Left 03/04/2020   Procedure: LEFT ENDARTERECTOMY CAROTID;  Surgeon: Rosetta Posner, MD;  Location: [facility];  Service: Vascular;  Laterality: Left;  . ENDARTERECTOMY Right 04/22/2020   Procedure: RIGHT CAROTID ENDARTERECTOMY;  Surgeon: Rosetta Posner, MD;  Location: [facility];  Service: Vascular;  Laterality: Right;  . HERNIA REPAIR    . PATCH ANGIOPLASTY Left 03/04/2020   Procedure: PATCH ANGIOPLASTY;  Surgeon: Rosetta Posner, MD;  Location: [facility];  Service: Vascular;  Laterality: Left;  . PERIPHERAL VASCULAR CATHETERIZATION N/A 12/18/2015   Procedure: Abdominal Aortogram;  Surgeon: Adrian Prows, MD;  Location: [facility];  Service: Cardiovascular;  Laterality: N/A;   Family History  Problem Relation Age of Onset  . Heart attack Mother   . Heart attack Father   . IgA nephropathy Son     Social History   Tobacco Use  . Smoking status: Former Smoker    Packs/day: 0.50    Years: 21.00    Pack years: 10.50    Types: Cigarettes    Quit date: 06/02/2020    Years  since quitting: 0.0  . Smokeless tobacco: Never Used  Substance Use Topics  . Alcohol use: Not Currently    Comment: quit drinking 4 months ago   Marital Status: Legally Separated  ROS  Review of Systems  Constitutional: Negative for malaise/fatigue and weight gain.  Cardiovascular: Positive for dyspnea on exertion (chornic, stable). Negative for chest pain, claudication, leg swelling, near-syncope, orthopnea, palpitations, paroxysmal nocturnal dyspnea and syncope.  Hematologic/Lymphatic: Does not bruise/bleed easily.  Gastrointestinal: Negative for melena.  Neurological: Negative for dizziness and weakness.   Objective  Blood pressure 108/65, pulse 60, temperature 98.2 F (36.8 C), temperature source Temporal, resp. rate 17, height 5\' 6"  (1.676 m), weight 186 lb 12.8 oz (84.7 kg), SpO2 99 %.  Vitals with BMI 06/09/2020 05/12/2020 04/23/2020  Height 5\' 6"  5\' 6"  -  Weight 186 lbs 13 oz 184 lbs -  BMI 44.03 47.42 -  Systolic 595 638 756  Diastolic 65 78 66  Pulse 60 74 58     Physical Exam Cardiovascular:     Rate and Rhythm: Regular rhythm. Bradycardia present.     Pulses: Intact distal pulses.          Carotid pulses are on the right side with bruit and on the left side with bruit.      Dorsalis pedis pulses are 1+ on the right side and 1+ on the left side.       Posterior tibial pulses are 1+ on the right side and 1+ on the left side.     Heart sounds: S1 normal and S2 normal. No murmur heard. No gallop.      Comments: No leg edema, no JVD. Left carotid endarterectomy scar has healed well. Right carotid endarterectomy incision healing well. Pulmonary:     Effort: Pulmonary effort is normal.     Breath sounds: Normal breath sounds.  Abdominal:     General: Bowel sounds are normal.     Palpations: Abdomen is soft.    Laboratory examination:   Recent Labs    07/11/19 0811 07/11/19 0812 02/11/20 1550 03/05/20 0157 04/16/20 1530 04/22/20 1154 04/23/20 0241  NA 141 141    < > 139 138  --  135  K 3.8 3.8   < > 3.9 4.3  --  4.3  CL 108* 108*   < > 105 104  --  104  CO2 20 18*   < > 17* 23  --  22  GLUCOSE 116* 118*   < > 155* 101*  --  157*  BUN 14 15   < > 18 24*  --  24*  CREATININE 1.12 1.04   < > 1.13 1.60* 1.42* 1.54*  CALCIUM 9.0 8.6   < > 8.5* 9.0  --  8.5*  GFRNONAA 69 76   < > >60 48* 55* 50*  GFRAA 80 87  --   --   --   --   --    < > =  values in this interval not displayed.   CrCl cannot be calculated (Patient's most recent lab result is older than the maximum 21 days allowed.).  CMP Latest Ref Rng & Units 04/23/2020 04/22/2020 04/16/2020  Glucose 70 - 99 mg/dL 157(H) - 101(H)  BUN 8 - 23 mg/dL 24(H) - 24(H)  Creatinine 0.61 - 1.24 mg/dL 1.54(H) 1.42(H) 1.60(H)  Sodium 135 - 145 mmol/L 135 - 138  Potassium 3.5 - 5.1 mmol/L 4.3 - 4.3  Chloride 98 - 111 mmol/L 104 - 104  CO2 22 - 32 mmol/L 22 - 23  Calcium 8.9 - 10.3 mg/dL 8.5(L) - 9.0  Total Protein 6.5 - 8.1 g/dL - - 6.8  Total Bilirubin 0.3 - 1.2 mg/dL - - 0.6  Alkaline Phos 38 - 126 U/L - - 109  AST 15 - 41 U/L - - 21  ALT 0 - 44 U/L - - 22   CBC Latest Ref Rng & Units 04/23/2020 04/22/2020 04/16/2020  WBC 4.0 - 10.5 K/uL 12.8(H) 10.5 8.9  Hemoglobin 13.0 - 17.0 g/dL 12.5(L) 13.2 13.9  Hematocrit 39.0 - 52.0 % 35.7(L) 39.8 41.0  Platelets 150 - 400 K/uL 168 164 192   Lipid Panel Recent Labs    07/11/19 0811 07/11/19 0813 07/17/19 1304  CHOL  --  91* 81*  TRIG  --  187* 114  LDLCALC  --  25 19  HDL  --  36* 41  LDLDIRECT 27  --   --     HEMOGLOBIN A1C Lab Results  Component Value Date   HGBA1C 6.2 (H) 07/11/2019   MPG 120 05/02/2008   TSH No results for input(s): TSH in the last 8760 hours.  Lipoprotein (a) <75.0 nmol/L 15.1, apolipoprotein B normal at 44.   Date 07/11/2019  Medications and allergies   Allergies  Allergen Reactions  . Vicodin [Hydrocodone-Acetaminophen] Nausea And Vomiting    Outpatient Medications Prior to Visit  Medication Sig Dispense Refill  .  amLODipine (NORVASC) 10 MG tablet Take 10 mg by mouth daily.     Marland Kitchen apixaban (ELIQUIS) 5 MG TABS tablet Take 1 tablet (5 mg total) by mouth 2 (two) times daily. 60 tablet 2  . buPROPion (WELLBUTRIN SR) 150 MG 12 hr tablet Take 1 tablet (150 mg total) by mouth 2 (two) times daily. Start one tab daily for 3 days. Then take morning and afternoon 60 tablet 2  . colchicine 0.6 MG tablet Take 0.6 mg by mouth daily as needed (gout flare).    Marland Kitchen escitalopram (LEXAPRO) 20 MG tablet Take 20 mg by mouth daily.    . hydrOXYzine (ATARAX/VISTARIL) 25 MG tablet Take 25 mg by mouth at bedtime.    Marland Kitchen losartan-hydrochlorothiazide (HYZAAR) 100-12.5 MG tablet Take 1 tablet by mouth daily.     . metoprolol tartrate (LOPRESSOR) 50 MG tablet Take 1 tablet (50 mg total) by mouth 3 (three) times daily. (Patient taking differently: Take 50 mg by mouth in the morning, at noon, and at bedtime.) 90 tablet 1  . MULTIPLE VITAMINS ESSENTIAL PO Take 1 tablet by mouth daily.    . nitroGLYCERIN (NITROSTAT) 0.4 MG SL tablet Place 1 tablet (0.4 mg total) under the tongue every 5 (five) minutes as needed. For chest pain (Patient taking differently: Place 0.4 mg under the tongue every 5 (five) minutes as needed for chest pain.) 30 tablet 0  . traZODone (DESYREL) 100 MG tablet Take 100 mg by mouth at bedtime.    Marland Kitchen VASCEPA 1 g capsule TAKE 2  CAPSULES(2 GRAMS) BY MOUTH TWICE DAILY 120 capsule 3  . aspirin 81 MG chewable tablet Chew 1 tablet (81 mg total) by mouth daily. 30 tablet 1  . allopurinol (ZYLOPRIM) 100 MG tablet Take 100 mg by mouth daily. (Patient not taking: Reported on 06/09/2020)    . atorvastatin (LIPITOR) 80 MG tablet TAKE 1 TABLET BY MOUTH DAILY 90 tablet 3   No facility-administered medications prior to visit.    Radiology:   CTA head and neck 02/11/2020: 1. Atherosclerotic changes at the bilateral carotid bifurcations. Focal area of bandlike stenosis in the right carotid bifurcation resulting in approximately 90%  stenosis. 2. Atherosclerotic changes of the left carotid bifurcation resulting in approximately 70% stenosis. 3. No acute intracranial abnormality. Remote infarcts within the bilateral basal ganglia. Chronic microangiopathy. 4. Emphysema and aortic atherosclerosis.  Cardiac Studies:   Echocardiogram 07/18/2019: Normal LV systolic function with visual EF 50-55%. Left ventricle cavity is normal in size. Moderate left ventricular hypertrophy. Normal global wall motion. Doppler evidence of grade II  diastolic dysfunction, elevated LAP.  Left atrial cavity is mildly dilated. Mild (Grade I) mitral regurgitation. Mild tricuspid regurgitation. Mild pulmonic regurgitation. Prior study dated 02/25/2016 noted LVEF 65%, mild to moderate MR, mild TR.   Carotid artery duplex  12/28/2019: Stenosis in the right internal carotid artery (80-99%). Stenosis in the right external carotid artery (>50%). Stenosis in the left internal carotid artery (80-99%). Stenosis in the left external carotid artery (<50%). The PSV internal/common carotid artery ratio is consistent with a stenosis of >70% bilaterally, right  7.89 and left 8.67.  Antegrade right vertebral artery flow. Antegrade left vertebral artery flow. Follow up in four months is appropriate if clinically indicated. Peak velocity on the right was 349/117 cm/s and 334/106 cm/s on the left. Compared to the study done on 07/18/2019, there is progression of disease in bilateral internal carotid artery.  Previously right ICA stenosis was 50 to 69% and left was >70%. See attached images.   EKG:   EKG 06/09/2020: Sinus bradycardia at a rate of 55 bpm.  Left atrial enlargement.  Normal axis.  Poor R wave progression, cannot exclude anteroseptal infarct old.  Nonspecific T wave abnormality.  EKG 04/04/2020: Atrial fibrillation with controlled ventricular response at the rate of 67 bpm, normal axis, poor R wave progression, cannot exclude anteroseptal infarct old.  No  evidence of ischemia.  No significant change from EKG 03/05/2020.  EKG 01/09/2020: Marked sinus bradycardia at rate of 46 bpm, m normal axis, poor R wave progression, probably normal variant.  No evidence of ischemia.    Assessment     ICD-10-CM   1. Paroxysmal atrial fibrillation (HCC)  I48.0 EKG 12-Lead  2. Bilateral carotid artery stenosis - s/p bilateral carotid endartarectomy   I65.23   3. Atherosclerosis of native coronary artery of native heart without angina pectoris  I25.10   4. Mixed hyperlipidemia  E78.2    {CHA2DS2-VASc Score is 5.  Yearly risk of stroke: 7.2% (A, HTN, Vasc Dz, CVA).  Score of 1=0.6; 2=2.2; 3=3.2; 4=4.8; 5=7.2; 6=9.8; 7=>9.8) -(CHF; HTN; vasc disease DM,  Male = 1; Age <65 =0; 65-74 = 1,  >75 =2; stroke/embolism= 2).     Medications Discontinued During This Encounter  Medication Reason  . atorvastatin (LIPITOR) 80 MG tablet Error  . aspirin 81 MG chewable tablet Change in therapy    Meds ordered this encounter  Medications  . atorvastatin (LIPITOR) 80 MG tablet    Sig: Take 1 tablet (80 mg  total) by mouth daily.    Dispense:  90 tablet    Refill:  3   Recommendations:   [name] is a 65 y.o.  Caucasian male with history of non-STEMI and angioplasty and stent, peripheral artery disease with interventions to his lower extremity in the remote past in [place], primary hypertension, mixed hyperlipidemia, active smoking, active marijuana use, history of substance abuse remotely, hypertension, history of TIA and excessive alcohol use quit in July 2021, family history of premature coronary artery disease in which his father died at the age of 35 and his mother at age 66 from MI with CABG in her 46s.    Underwent left carotid endarterectomy on 03/04/2020 and was found to have postop atrial fibrillation with RVR.  Underwent successful right carotid endarterectomy 04/08/2020.   Patient now presents for 11-month follow-up.  At this time he had not resumed Eliquis,  in view of paroxysmal atrial fibrillation and CHA2DS2-VASc score of 5 recommend oral anticoagulation.  He has previously tolerated Eliquis without bleeding diathesis, will restart him at 5 mg twice daily.  As patient will be on anticoagulation, recommend he discontinue aspirin 81 mg at this time.   Patient has now converted to sinus rhythm.  He is bradycardic, but asymptomatic.  We will continue metoprolol titrate 3 times daily.  Blood pressure is well controlled we will continue current antihypertensive medications including amlodipine, metoprolol, and losartan/hydrochlorthiazide.  Patient has discontinued for unknown reason atorvastatin, I have refilled this at this time.  Patient is congratulated regarding smoking cessation, continue Wellbutrin and encouragement provided.   Follow-up in 3 months, sooner if needed, for paroxysmal atrial fibrillation and carotid artery disease.    Alethia Berthold, PA-C 06/11/2020, 4:19 PM Office: [phone]

## 2020-06-10 ENCOUNTER — Telehealth: Payer: Self-pay

## 2020-06-10 NOTE — Telephone Encounter (Signed)
Pt called asking if he can continue the brillinta now that his procedure is finished.

## 2020-06-10 NOTE — Telephone Encounter (Signed)
Called pt and left vm letting him know. So is he suppose to continue taking the eliquis?

## 2020-06-10 NOTE — Telephone Encounter (Signed)
He is on Eliquis and not Brilinta

## 2020-06-10 NOTE — Telephone Encounter (Signed)
Correct, as he and I discussed at recent office visit he is to be taking Eliquis.

## 2020-06-11 NOTE — Telephone Encounter (Signed)
Have mad multiple attempts to call pt, no answer. Left vm requesting call back.

## 2020-06-17 ENCOUNTER — Other Ambulatory Visit: Payer: Self-pay | Admitting: Cardiology

## 2020-06-24 ENCOUNTER — Other Ambulatory Visit: Payer: Self-pay

## 2020-07-07 ENCOUNTER — Other Ambulatory Visit: Payer: Self-pay

## 2020-07-07 MED ORDER — APIXABAN 5 MG PO TABS: 5.0000 mg | ORAL_TABLET | Freq: Two times a day (BID) | ORAL | 2 refills | Status: DC

## 2020-09-08 NOTE — Progress Notes (Signed)
Primary Physician/Referring:  [name], MD  Patient ID: [name], male    DOB: 04/13/1955, 65 y.o.   MRN: [id number]  Chief Complaint  Patient presents with   Atrial Fibrillation   Carotid Disease   Follow-up    3 month   HPI:    [name]  is a 65 y.o. 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    Underwent left carotid endarterectomy on 03/04/2020 and was found to have postop atrial fibrillation with RVR.  Underwent successful right carotid endarterectomy 04/08/2020.   Patient presents for 66-month follow-up of paroxysmal atrial fibrillation and carotid artery disease, as well as CAD.  At last visit restarted patient on oral anticoagulation with Eliquis given CHA2DS2-VASc score of 5 and postop atrial fibrillation, and discontinued aspirin 81 mg daily.  Since last he admits he has been smoking cigarettes occasionally but not any daily basis, however he does continue to smoke marijuana on a daily basis.  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  Patient denies chest pain, palpitations, syncope, near syncope, symptoms suggestive of TIA/CVA.  He denies symptoms of claudication.  He also is tolerating anticoagulation without bleeding diathesis.  Past Medical History:  Diagnosis Date   AKI (acute kidney injury) ([place]) 12/2015   Anxiety    Arthritis    Carotid artery stenosis    Coronary artery disease    Dementia ([place])    problems with short term memory    Depression    Dysrhythmia    episode of afib 12/2005 in setting of cocaine use   Gout    Hyperlipidemia    Hypertension    Myocardial infarction [facility])    2017   PAD (peripheral artery disease) (H[facility])    Stroke (HCC)    TIA   TIA (transient ischemic attack)    over 20 years ago   Past Surgical History:  Procedure Laterality Date   CARDIAC CATHETERIZATION N/A 12/18/2015   Procedure: Left Heart Cath and Coronary Angiography;  Surgeon: Adrian Prows, MD;  Location: [place] [facility];  Service: Cardiovascular;  Laterality: N/A;   CARDIAC CATHETERIZATION N/A 12/18/2015   Procedure: Coronary Stent Intervention;  Surgeon: Adrian Prows, MD;  Location: [place] [facility];  Service: Cardiovascular;  Laterality: N/A;   CARDIAC CATHETERIZATION N/A 12/18/2015   Procedure: Coronary Balloon Angioplasty;  Surgeon: Adrian Prows, MD;  Location: [place] [facility];  Service: Cardiovascular;  Laterality: N/A;   COLONOSCOPY N/A 01/12/2016   Procedure: COLONOSCOPY;  Surgeon: Manus Gunning, MD;  Location: [facility] ENDOSCOPY;  Service: Gastroenterology;  Laterality: N/A;   ENDARTERECTOMY Left 03/04/2020   Procedure: LEFT ENDARTERECTOMY CAROTID;  Surgeon: Rosetta Posner, MD;  Location: [facility] OR;  Service: Vascular;  Laterality: Left;   ENDARTERECTOMY Right 04/22/2020   Procedure: RIGHT CAROTID ENDARTERECTOMY;  Surgeon: Rosetta Posner, MD;  Location: [place];  Service: Vascular;  Laterality: Right;   HERNIA REPAIR     PATCH ANGIOPLASTY Left 03/04/2020   Procedure: PATCH ANGIOPLASTY;  Surgeon: Donnetta Hutching,  Arvilla Meres, MD;  Location: [place];  Service: Vascular;  Laterality: Left;   PERIPHERAL VASCULAR CATHETERIZATION N/A 12/18/2015   Procedure: Abdominal Aortogram;  Surgeon: Adrian Prows, MD;  Location: [place] [facility];  Service: Cardiovascular;  Laterality: N/A;   Family History  Problem Relation Age of Onset   Heart attack Mother    Heart attack Father    IgA nephropathy Son     Social History   Tobacco Use   Smoking status: Former     Packs/day: 0.50    Years: 21.00    Pack years: 10.50    Types: Cigarettes    Quit date: 06/02/2020    Years since quitting: 0.2   Smokeless tobacco: Never  Substance Use Topics   Alcohol use: Not Currently    Comment: quit drinking 9 months ago   Marital Status: Legally Separated  ROS  Review of Systems  Constitutional: Negative for malaise/fatigue and weight gain.  Cardiovascular:  Positive for dyspnea on exertion (chornic, stable). Negative for chest pain, claudication, leg swelling, near-syncope, orthopnea, palpitations, paroxysmal nocturnal dyspnea and syncope.  Hematologic/Lymphatic: Does not bruise/bleed easily.  Gastrointestinal:  Negative for melena.  Neurological:  Negative for dizziness and weakness.  Objective  Blood pressure 116/65, pulse 66, temperature 98.8 F (37.1 C), temperature source Temporal, resp. rate 16, height 5\' 6"  (1.676 m), weight 191 lb 6.4 oz (86.8 kg), SpO2 97 %.  Vitals with BMI 09/09/2020 06/09/2020 05/12/2020  Height 5\' 6"  5\' 6"  5\' 6"   Weight 191 lbs 6 oz 186 lbs 13 oz 184 lbs  BMI 30.91 42.35 36.14  Systolic 431 540 086  Diastolic 65 65 78  Pulse 66 60 74     Physical Exam Vitals reviewed.  Cardiovascular:     Rate and Rhythm: Regular rhythm. Bradycardia present.     Pulses: Intact distal pulses.          Carotid pulses are  on the right side with bruit and  on the left side with bruit.      Popliteal pulses are 1+ on the right side and 1+ on the left side.       Dorsalis pedis pulses are 1+ on the right side and 1+ on the left side.       Posterior tibial pulses are 0 on the right side and 0 on the left side.     Heart sounds: S1 normal and S2 normal. Murmur heard.  Systolic murmur is present with a grade of 2/6 at the upper right sternal border.    No gallop.     Comments: No leg edema, no JVD. Left carotid endarterectomy scar has healed well. Right carotid endarterectomy incision healed well.  Pulmonary:     Effort: Pulmonary effort is  normal.     Breath sounds: Normal breath sounds.   Laboratory examination:   Recent Labs    03/05/20 0157 04/16/20 1530 04/22/20 1154 04/23/20 0241  NA 139 138  --  135  K 3.9 4.3  --  4.3  CL 105 104  --  104  CO2 17* 23  --  22  GLUCOSE 155* 101*  --  157*  BUN 18 24*  --  24*  CREATININE 1.13 1.60* 1.42* 1.54*  CALCIUM 8.5* 9.0  --  8.5*  GFRNONAA >60 48* 55* 50*   CrCl cannot be calculated (Patient's most recent lab result is older than the maximum 21 days allowed.).  CMP Latest Ref Rng & Units 04/23/2020 04/22/2020 04/16/2020  Glucose 70 - 99 mg/dL 157(H) - 101(H)  BUN 8 - 23 mg/dL 24(H) - 24(H)  Creatinine 0.61 - 1.24 mg/dL 1.54(H) 1.42(H) 1.60(H)  Sodium 135 - 145 mmol/L 135 - 138  Potassium 3.5 - 5.1 mmol/L 4.3 - 4.3  Chloride 98 - 111 mmol/L 104 - 104  CO2 22 - 32 mmol/L 22 - 23  Calcium 8.9 - 10.3 mg/dL 8.5(L) - 9.0  Total Protein 6.5 - 8.1 g/dL - - 6.8  Total Bilirubin 0.3 - 1.2 mg/dL - - 0.6  Alkaline Phos 38 - 126 U/L - - 109  AST 15 - 41 U/L - - 21  ALT 0 - 44 U/L - - 22   CBC Latest Ref Rng & Units 04/23/2020 04/22/2020 04/16/2020  WBC 4.0 - 10.5 K/uL 12.8(H) 10.5 8.9  Hemoglobin 13.0 - 17.0 g/dL 12.5(L) 13.2 13.9  Hematocrit 39.0 - 52.0 % 35.7(L) 39.8 41.0  Platelets 150 - 400 K/uL 168 164 192   Lipid Panel No results for input(s): CHOL, TRIG, LDLCALC, VLDL, HDL, CHOLHDL, LDLDIRECT in the last 8760 hours.   HEMOGLOBIN A1C Lab Results  Component Value Date   HGBA1C 6.2 (H) 07/11/2019   MPG 120 05/02/2008   TSH No results for input(s): TSH in the last 8760 hours.  Lipoprotein (a) <75.0 nmol/L 15.1, apolipoprotein B normal at 44.   Date 07/11/2019 Allergies   Allergies  Allergen Reactions   Vicodin [Hydrocodone-Acetaminophen] Nausea And Vomiting      Medications Prior to Visit:   Outpatient Medications Prior to Visit  Medication Sig Dispense Refill   allopurinol (ZYLOPRIM) 100 MG tablet Take 100 mg by mouth daily.     amLODipine (NORVASC) 10  MG tablet Take 10 mg by mouth every evening.     apixaban (ELIQUIS) 5 MG TABS tablet Take 1 tablet (5 mg total) by mouth 2 (two) times daily. 60 tablet 2   atorvastatin (LIPITOR) 80 MG tablet Take 1 tablet (80 mg total) by mouth daily. 90 tablet 3   buPROPion (WELLBUTRIN SR) 150 MG 12 hr tablet TAKE 1 TABLET(150 MG) BY MOUTH TWICE DAILY. START 1 TABLET DAILY FOR 3 DAYS. THEN TAKE MORNING AND AFTERNOON 60 tablet 2   colchicine 0.6 MG tablet Take 0.6 mg by mouth daily as needed (gout flare).     escitalopram (LEXAPRO) 20 MG tablet Take 20 mg by mouth daily.     hydrOXYzine (ATARAX/VISTARIL) 25 MG tablet Take 25 mg by mouth at bedtime.     losartan-hydrochlorothiazide (HYZAAR) 100-12.5 MG tablet Take 1 tablet by mouth daily.      metoprolol tartrate (LOPRESSOR) 50 MG tablet Take 1 tablet (50 mg total) by mouth 3 (three) times daily. (Patient taking differently: Take 50 mg by mouth in the morning, at noon, and at bedtime.) 90 tablet 1   MULTIPLE VITAMINS ESSENTIAL PO Take 1 tablet by mouth daily.     nitroGLYCERIN (NITROSTAT) 0.4 MG SL tablet Place 1 tablet (0.4 mg total) under the tongue every 5 (five) minutes as needed. For chest pain (Patient taking differently: Place 0.4 mg under the tongue every 5 (five) minutes as needed for chest pain.) 30 tablet 0   traZODone (DESYREL) 100 MG tablet Take 100 mg by mouth at bedtime.     VASCEPA 1 g capsule TAKE 2 CAPSULES(2 GRAMS) BY MOUTH TWICE DAILY 120 capsule 3   No facility-administered medications prior to visit.     Final Medications at End of Visit    Current Meds  Medication  Sig   allopurinol (ZYLOPRIM) 100 MG tablet Take 100 mg by mouth daily.   amLODipine (NORVASC) 10 MG tablet Take 10 mg by mouth every evening.   apixaban (ELIQUIS) 5 MG TABS tablet Take 1 tablet (5 mg total) by mouth 2 (two) times daily.   atorvastatin (LIPITOR) 80 MG tablet Take 1 tablet (80 mg total) by mouth daily.   buPROPion (WELLBUTRIN SR) 150 MG 12 hr tablet TAKE 1  TABLET(150 MG) BY MOUTH TWICE DAILY. START 1 TABLET DAILY FOR 3 DAYS. THEN TAKE MORNING AND AFTERNOON   colchicine 0.6 MG tablet Take 0.6 mg by mouth daily as needed (gout flare).   escitalopram (LEXAPRO) 20 MG tablet Take 20 mg by mouth daily.   hydrOXYzine (ATARAX/VISTARIL) 25 MG tablet Take 25 mg by mouth at bedtime.   losartan-hydrochlorothiazide (HYZAAR) 100-12.5 MG tablet Take 1 tablet by mouth daily.    metoprolol tartrate (LOPRESSOR) 50 MG tablet Take 1 tablet (50 mg total) by mouth 3 (three) times daily. (Patient taking differently: Take 50 mg by mouth in the morning, at noon, and at bedtime.)   MULTIPLE VITAMINS ESSENTIAL PO Take 1 tablet by mouth daily.   nitroGLYCERIN (NITROSTAT) 0.4 MG SL tablet Place 1 tablet (0.4 mg total) under the tongue every 5 (five) minutes as needed. For chest pain (Patient taking differently: Place 0.4 mg under the tongue every 5 (five) minutes as needed for chest pain.)   traZODone (DESYREL) 100 MG tablet Take 100 mg by mouth at bedtime.   VASCEPA 1 g capsule TAKE 2 CAPSULES(2 GRAMS) BY MOUTH TWICE DAILY   Radiology:   CTA head and neck 02/11/2020: 1. Atherosclerotic changes at the bilateral carotid bifurcations. Focal area of bandlike stenosis in the right carotid bifurcation resulting in approximately 90% stenosis. 2. Atherosclerotic changes of the left carotid bifurcation resulting in approximately 70% stenosis. 3. No acute intracranial abnormality. Remote infarcts within the bilateral basal ganglia. Chronic microangiopathy. 4. Emphysema and aortic atherosclerosis.  Cardiac Studies:   Echocardiogram 07/18/2019: Normal LV systolic function with visual EF 50-55%. Left ventricle cavity is normal in size. Moderate left ventricular hypertrophy. Normal global wall motion. Doppler evidence of grade II  diastolic dysfunction, elevated LAP.  Left atrial cavity is mildly dilated. Mild (Grade I) mitral regurgitation. Mild tricuspid regurgitation. Mild  pulmonic regurgitation. Prior study dated 02/25/2016 noted LVEF 65%, mild to moderate MR, mild TR.   Carotid artery duplex  12/28/2019: Stenosis in the right internal carotid artery (80-99%). Stenosis in the right external carotid artery (>50%). Stenosis in the left internal carotid artery (80-99%). Stenosis in the left external carotid artery (<50%). The PSV internal/common carotid artery ratio is consistent with a stenosis of >70% bilaterally, right  7.89 and left 8.67.  Antegrade right vertebral artery flow. Antegrade left vertebral artery flow. Follow up in four months is appropriate if clinically indicated. Peak velocity on the right was 349/117 cm/s and 334/106 cm/s on the left. Compared to the study done on 07/18/2019, there is progression of disease in bilateral internal carotid artery.  Previously right ICA stenosis was 50 to 69% and left was >70%. See attached images.   EKG:   09/09/2020: Sinus bradycardia at a rate of 56 bpm.  Left atrial enlargement.  Normal axis.  No evidence of ischemia or underlying injury pattern.  Compared to EKG 06/09/2020, no PRWP noted.  EKG 04/04/2020: Atrial fibrillation with controlled ventricular response at the rate of 67 bpm, normal axis, poor R wave progression, cannot exclude anteroseptal infarct old.  No evidence of ischemia.  No significant change from EKG 03/05/2020.  EKG 01/09/2020: Marked sinus bradycardia at rate of 46 bpm, m normal axis, poor R wave progression, probably normal variant.  No evidence of ischemia.    Assessment     ICD-10-CM   1. Paroxysmal atrial fibrillation (HCC)  I48.0 EKG 12-Lead    2. Bilateral carotid artery stenosis - s/p bilateral carotid endartarectomy   I65.23 Lipid Panel With LDL/HDL Ratio    3. Primary hypertension  I10     4. Diastolic dysfunction  P32.95 PCV ECHOCARDIOGRAM COMPLETE    5. Peripheral artery disease (HCC)  I73.9 PCV ANKLE BRACHIAL INDEX (ABI)     {CHA2DS2-VASc Score is 5.  Yearly risk of stroke:  7.2% (A, HTN, Vasc Dz, CVA).  Score of 1=0.6; 2=2.2; 3=3.2; 4=4.8; 5=7.2; 6=9.8; 7=>9.8) -(CHF; HTN; vasc disease DM,  Male = 1; Age <65 =0; 65-74 = 1,  >75 =2; stroke/embolism= 2).     There are no discontinued medications.   No orders of the defined types were placed in this encounter.  Recommendations:   [name] is a 65 y.o.  Caucasian male with history of non-STEMI and angioplasty and stent, peripheral artery disease with interventions to his lower extremity in the remote past in [place], primary hypertension, mixed hyperlipidemia, active smoking, active marijuana use, history of substance abuse remotely, hypertension, history of TIA and excessive alcohol use quit in July 2021, family history of premature coronary artery disease in which his father died at the age of 72 and his mother at age 57 from MI with CABG in her 17s.    Underwent left carotid endarterectomy on 03/04/2020 and was found to have postop atrial fibrillation with RVR.  Underwent successful right carotid endarterectomy 04/08/2020.   Patient presents for 18-month follow-up of paroxysmal atrial fibrillation and carotid artery disease, as well as CAD.  At last visit restarted patient on oral anticoagulation with Eliquis given CHA2DS2-VASc score of 5 and postop atrial fibrillation, and discontinued aspirin 81 mg daily.  Unfortunately patient has resumed occasional cigarette smoking, have counseled him for 4 minutes regarding tobacco cessation, particularly in view of CAD and PAD.  Patient denies symptoms of claudication, however on physical exam posterior tibial pulses seem reduced compared to previous.  We will obtain ABI.  Also on physical exam noted to soft systolic murmur at right upper sternal border.  We will therefore obtain repeat echocardiogram at this time to further evaluate.  Regard to atrial fibrillation he is tolerating anticoagulation without bleeding diathesis, will continue Eliquis.  There is no recent lipid  profile available for review, will therefore obtain lipid profile testing.  Patient is otherwise doing well and is relatively asymptomatic.  He is scheduled for follow-up with vascular surgery of carotid artery disease in November.  Follow-up in 6 months unless significant abnormalities noted on cardiac testing, sooner if needed, for paroxysmal atrial fibrillation, CAD, PAD, carotid artery disease.   Alethia Berthold, PA-C 09/09/2020, 3:40 PM Office: [phone]

## 2020-09-09 ENCOUNTER — Other Ambulatory Visit: Payer: Self-pay

## 2020-09-09 ENCOUNTER — Encounter: Payer: Self-pay | Admitting: Student

## 2020-09-09 ENCOUNTER — Ambulatory Visit: Payer: 59 | Admitting: Student

## 2020-09-09 VITALS — BP 116/65 | HR 66 | Temp 98.8°F | Resp 16 | Ht 66.0 in | Wt 191.4 lb

## 2020-09-09 DIAGNOSIS — I739 Peripheral vascular disease, unspecified: Secondary | ICD-10-CM

## 2020-09-09 DIAGNOSIS — I5189 Other ill-defined heart diseases: Secondary | ICD-10-CM

## 2020-09-09 DIAGNOSIS — I480 Paroxysmal atrial fibrillation: Secondary | ICD-10-CM

## 2020-09-09 DIAGNOSIS — I6523 Occlusion and stenosis of bilateral carotid arteries: Secondary | ICD-10-CM

## 2020-09-09 DIAGNOSIS — I10 Essential (primary) hypertension: Secondary | ICD-10-CM

## 2020-09-18 ENCOUNTER — Other Ambulatory Visit: Payer: Self-pay | Admitting: Cardiology

## 2020-09-18 DIAGNOSIS — Z955 Presence of coronary angioplasty implant and graft: Secondary | ICD-10-CM

## 2020-09-18 DIAGNOSIS — I2510 Atherosclerotic heart disease of native coronary artery without angina pectoris: Secondary | ICD-10-CM

## 2020-09-18 DIAGNOSIS — I252 Old myocardial infarction: Secondary | ICD-10-CM

## 2020-09-23 ENCOUNTER — Other Ambulatory Visit: Payer: Self-pay

## 2020-09-23 ENCOUNTER — Ambulatory Visit: Payer: 59

## 2020-09-23 DIAGNOSIS — I5189 Other ill-defined heart diseases: Secondary | ICD-10-CM

## 2020-09-23 DIAGNOSIS — I739 Peripheral vascular disease, unspecified: Secondary | ICD-10-CM

## 2020-09-30 NOTE — Progress Notes (Signed)
ABI stable compared to 2017.

## 2020-09-30 NOTE — Progress Notes (Signed)
Echo stable compared to 06/2020.

## 2020-10-01 NOTE — Progress Notes (Signed)
Called and spoke with patient regarding his echocardiogram.

## 2020-10-01 NOTE — Progress Notes (Signed)
Called and spoke with patient regarding his  ABI results.

## 2020-10-09 ENCOUNTER — Other Ambulatory Visit: Payer: Self-pay | Admitting: Internal Medicine

## 2020-10-10 LAB — CBC
HCT: 38.3 % — ABNORMAL LOW (ref 38.5–50.0)
Hemoglobin: 12.7 g/dL — ABNORMAL LOW (ref 13.2–17.1)
MCH: 31.7 pg (ref 27.0–33.0)
MCHC: 33.2 g/dL (ref 32.0–36.0)
MCV: 95.5 fL (ref 80.0–100.0)
MPV: 11.3 fL (ref 7.5–12.5)
Platelets: 167 10*3/uL (ref 140–400)
RBC: 4.01 10*6/uL — ABNORMAL LOW (ref 4.20–5.80)
RDW: 13.9 % (ref 11.0–15.0)
WBC: 8.7 10*3/uL (ref 3.8–10.8)

## 2020-10-10 LAB — COMPLETE METABOLIC PANEL WITH GFR
AG Ratio: 1.5 (calc) (ref 1.0–2.5)
ALT: 16 U/L (ref 9–46)
AST: 17 U/L (ref 10–35)
Albumin: 4 g/dL (ref 3.6–5.1)
Alkaline phosphatase (APISO): 113 U/L (ref 35–144)
BUN/Creatinine Ratio: 22 (calc) (ref 6–22)
BUN: 27 mg/dL — ABNORMAL HIGH (ref 7–25)
CO2: 23 mmol/L (ref 20–32)
Calcium: 8.9 mg/dL (ref 8.6–10.3)
Chloride: 105 mmol/L (ref 98–110)
Creat: 1.25 mg/dL (ref 0.70–1.35)
Globulin: 2.6 g/dL (calc) (ref 1.9–3.7)
Glucose, Bld: 106 mg/dL — ABNORMAL HIGH (ref 65–99)
Potassium: 4.3 mmol/L (ref 3.5–5.3)
Sodium: 137 mmol/L (ref 135–146)
Total Bilirubin: 0.2 mg/dL (ref 0.2–1.2)
Total Protein: 6.6 g/dL (ref 6.1–8.1)
eGFR: 64 mL/min/{1.73_m2} (ref >=60)

## 2020-10-10 LAB — PSA: PSA: 1.25 ng/mL (ref <=4.00)

## 2020-10-10 LAB — TSH: TSH: 1.59 mIU/L (ref 0.40–4.50)

## 2020-10-10 LAB — URIC ACID: Uric Acid, Serum: 6.7 mg/dL (ref 4.0–8.0)

## 2020-10-10 LAB — VITAMIN D 25 HYDROXY (VIT D DEFICIENCY, FRACTURES): Vit D, 25-Hydroxy: 51 ng/mL (ref 30–100)

## 2020-10-13 ENCOUNTER — Other Ambulatory Visit: Payer: Self-pay | Admitting: Cardiology

## 2020-10-18 ENCOUNTER — Other Ambulatory Visit: Payer: Self-pay | Admitting: Cardiology

## 2021-01-23 ENCOUNTER — Other Ambulatory Visit: Payer: Self-pay | Admitting: Student

## 2021-01-26 ENCOUNTER — Encounter: Payer: Self-pay | Admitting: Pharmacist

## 2021-01-26 NOTE — Progress Notes (Signed)
CARE PLAN ENTRY  01/26/2021 Name: [name] MRN: [id number] DOB: 1955-12-12  [name] is enrolled in Remote Patient Monitoring/Principle Care Monitoring.  Date of Enrollment: 07/10/2019 Supervising physician: [name] Indication: HTN  Remote Readings: Compliant and Avg BP: 145/83, HR:66  Next scheduled OV: 03/12/21  Pharmacist Clinical Goal(s):  Over the next 90 days, patient will demonstrate Improved medication adherence as evidenced by medication fill history Over the next 90 days, patient will demonstrate improved understanding of prescribed medications and rationale for usage as evidenced by patient teach back Over the next 90 days, patient will experience decrease in ED visits. ED visits in last 6 months = 0 Over the next 90 days, patient will not experience hospital admission. Hospital Admissions in last 6 months = 0  Interventions: Provider and Inter-disciplinary care team collaboration (see longitudinal plan of care) Comprehensive medication review performed. Discussed plans with patient for ongoing care management follow up and provided patient with direct contact information for care management team Collaboration with provider re: medication management  Patient Self Care Activities:  Self administers medications as prescribed Attends all scheduled provider appointments Performs ADL's independently Performs IADL's independently  Allergies  Allergen Reactions   Vicodin [Hydrocodone-Acetaminophen] Nausea And Vomiting   Outpatient Encounter Medications as of 01/26/2021  Medication Sig Note   allopurinol (ZYLOPRIM) 100 MG tablet Take 100 mg by mouth daily.    amLODipine (NORVASC) 10 MG tablet Take 10 mg by mouth every evening.    atorvastatin (LIPITOR) 80 MG tablet TAKE 1 TABLET BY MOUTH DAILY    buPROPion (WELLBUTRIN SR) 150 MG 12 hr tablet TAKE 1 TABLET(150 MG) BY MOUTH TWICE DAILY. START 1 TABLET DAILY FOR 3 DAYS. THEN TAKE MORNING AND AFTERNOON     colchicine 0.6 MG tablet Take 0.6 mg by mouth daily as needed (gout flare).    ELIQUIS 5 MG TABS tablet TAKE 1 TABLET(5 MG) BY MOUTH TWICE DAILY    escitalopram (LEXAPRO) 20 MG tablet Take 20 mg by mouth daily.    hydrOXYzine (ATARAX/VISTARIL) 25 MG tablet Take 25 mg by mouth at bedtime.    losartan-hydrochlorothiazide (HYZAAR) 100-12.5 MG tablet Take 1 tablet by mouth daily.     metoprolol tartrate (LOPRESSOR) 50 MG tablet Take 1 tablet (50 mg total) by mouth 3 (three) times daily. (Patient taking differently: Take 50 mg by mouth in the morning, at noon, and at bedtime.)    MULTIPLE VITAMINS ESSENTIAL PO Take 1 tablet by mouth daily.    nitroGLYCERIN (NITROSTAT) 0.4 MG SL tablet Place 1 tablet (0.4 mg total) under the tongue every 5 (five) minutes as needed. For chest pain (Patient taking differently: Place 0.4 mg under the tongue every 5 (five) minutes as needed for chest pain.) 04/22/2020: Never had to use   traZODone (DESYREL) 100 MG tablet Take 100 mg by mouth at bedtime.    VASCEPA 1 g capsule TAKE 2 CAPSULES(2 GRAMS) BY MOUTH TWICE DAILY    No facility-administered encounter medications on file as of 01/26/2021.    Hypertension   BP goal is:  <130/80  Office blood pressures are  BP Readings from Last 3 Encounters:  09/09/20 116/65  06/09/20 108/65  05/12/20 (!) 149/78    Patient is currently uncontrolled on the following medications: metoprolol 50 mg TID, losartan HCTZ 100/12.5 mg, amlodipine 10 mg   Patient checks BP at home daily  Patient home BP readings are ranging: 114-177/65-104  Patient has tried  these meds in the past: clonidine, nebivolol (  cost)  We discussed diet and exercise extensively  Plan  Continue current medications and control with diet and exercise   BP readings reviewed continues to be labile and not at goal.  AM BP readings >> than PM BP reading. ABI and ECHO stable. Recent labs reviewed and continues to remain stable. Called to discuss with pt. LVM  for pt to call back. May consider switching metopolol to labetalol and or adding spironolactone or chlorthalidone for added BP management.   ______________ Visit Information SDOH (Social Determinants of Health) assessments performed: Yes.  [name] was given information about Principle Care Management/Remote Patient Monitoring services today including:  RPM/PCM service includes personalized support from designated clinical staff supervised by his physician, including individualized plan of care and coordination with other care providers 24/7 contact phone numbers for assistance for urgent and routine care needs. Standard insurance, coinsurance, copays and deductibles apply for principle care management only during months in which we provide at least 30 minutes of these services. Most insurances cover these services at 100%, however patients may be responsible for any copay, coinsurance and/or deductible if applicable. This service may help you avoid the need for more expensive face-to-face services. Only one practitioner may furnish and bill the service in a calendar month. The patient may stop PCM/RPM services at any time (effective at the end of the month) by phone call to the office staff.  Patient agreed to services and verbal consent obtained.   [name], Pharm.D. [facility] [phone] Ext: [phone]

## 2021-02-15 ENCOUNTER — Other Ambulatory Visit: Payer: Self-pay | Admitting: Cardiology

## 2021-02-15 DIAGNOSIS — I252 Old myocardial infarction: Secondary | ICD-10-CM

## 2021-02-15 DIAGNOSIS — Z955 Presence of coronary angioplasty implant and graft: Secondary | ICD-10-CM

## 2021-02-15 DIAGNOSIS — I2510 Atherosclerotic heart disease of native coronary artery without angina pectoris: Secondary | ICD-10-CM

## 2021-03-03 ENCOUNTER — Telehealth: Payer: Self-pay

## 2021-03-03 ENCOUNTER — Other Ambulatory Visit: Payer: Self-pay

## 2021-03-03 NOTE — Telephone Encounter (Signed)
I see Lopressor was discontinued, will address at upcoming office visit.

## 2021-03-03 NOTE — Telephone Encounter (Signed)
Patient called to update medication list. Medications have been updated in [facility].

## 2021-03-11 ENCOUNTER — Ambulatory Visit: Payer: 59

## 2021-03-11 ENCOUNTER — Ambulatory Visit (HOSPITAL_COMMUNITY): Payer: 59 | Attending: Physician Assistant

## 2021-03-11 NOTE — Progress Notes (Signed)
Primary Physician/Referring:  [name], MD  Patient ID: [name], male    DOB: May 22, 1955, 65 y.o.   MRN: [id number]  Chief Complaint  Patient presents with   Atrial Fibrillation   Hyperlipidemia   Hypertension   PAD   Follow-up    6 month   HPI:    [name]  is a 65 y.o. 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    Underwent left carotid endarterectomy on 03/04/2020 and was found to have postop atrial fibrillation with RVR.  Underwent successful right carotid endarterectomy 04/08/2020.   Patient presents for 66-month follow-up.  At last office visit ordered ABI given reduced pedal pulses, which was stable with moderately reduced perfusion bilaterally compared to 2017.  During last office visit noted new systolic murmur at the right upper sternal border, therefore obtain echocardiogram.  Echocardiogram unchanged compared to 06/2020 with LVEF 55%, moderate LVH with grade 1 diastolic dysfunction, and moderate MR and mild to moderate tricuspid regurgitation.Since last office visit Lopressor has been discontinued because patient ran out. Notably previously ordered lipid panel has not been done.  Office visit patient has unfortunately resumed smoking approximately a pack per day and using marijuana regularly.  He continues to refrain from alcohol use.  He is now seeing a psychiatrist.  Patient denies chest pain, palpitations, syncope, near syncope, symptoms suggestive of TIA/CVA.  He denies symptoms of  claudication.  He also is tolerating anticoagulation without bleeding diathesis.  Past Medical History:  Diagnosis Date   AKI (acute kidney injury) ([place]) 12/2015   Anxiety    Arthritis    Carotid artery stenosis    Coronary artery disease    Dementia ([place])    problems with short term memory   Depression    Dysrhythmia    episode of afib 12/2005 in setting of cocaine use   Gout    Hyperlipidemia    Hypertension    Myocardial infarction [facility])    2017   PAD (peripheral artery disease) (HCC)    Stroke (HCC)    TIA   TIA (transient ischemic attack)    over 20 years ago   Past Surgical History:  Procedure Laterality Date   CARDIAC CATHETERIZATION N/A 12/18/2015   Procedure: Left Heart Cath and Coronary Angiography;  Surgeon: Adrian Prows, MD;  Location: [place] [facility];  Service: Cardiovascular;  Laterality: N/A;   CARDIAC CATHETERIZATION N/A 12/18/2015   Procedure: Coronary Stent Intervention;  Surgeon: Adrian Prows, MD;  Location: [place] [facility];  Service: Cardiovascular;  Laterality: N/A;   CARDIAC CATHETERIZATION N/A 12/18/2015   Procedure: Coronary Balloon Angioplasty;  Surgeon: Adrian Prows, MD;  Location: [place] [facility];  Service: Cardiovascular;  Laterality: N/A;   COLONOSCOPY N/A 01/12/2016   Procedure: COLONOSCOPY;  Surgeon: Manus Gunning, MD;  Location: [facility];  Service: Gastroenterology;  Laterality: N/A;   ENDARTERECTOMY Left 03/04/2020   Procedure: LEFT ENDARTERECTOMY CAROTID;  Surgeon: Rosetta Posner, MD;  Location: [place];  Service: Vascular;  Laterality: Left;  ENDARTERECTOMY Right 04/22/2020   Procedure: RIGHT CAROTID ENDARTERECTOMY;  Surgeon: Rosetta Posner, MD;  Location: [place];  Service: Vascular;  Laterality: Right;   HERNIA REPAIR     PATCH ANGIOPLASTY Left 03/04/2020   Procedure: PATCH ANGIOPLASTY;  Surgeon: Rosetta Posner, MD;  Location: [place];  Service: Vascular;  Laterality: Left;   PERIPHERAL VASCULAR CATHETERIZATION N/A 12/18/2015   Procedure:  Abdominal Aortogram;  Surgeon: Adrian Prows, MD;  Location: [place] [facility];  Service: Cardiovascular;  Laterality: N/A;   Family History  Problem Relation Age of Onset   Heart attack Mother    Heart attack Father    IgA nephropathy Son     Social History   Tobacco Use   Smoking status: Every Day    Packs/day: 1.00    Years: 21.00    Pack years: 21.00    Types: Cigarettes   Smokeless tobacco: Never  Substance Use Topics   Alcohol use: Not Currently    Comment: quit drinking 9 months ago   Marital Status: Legally Separated  ROS  Review of Systems  Constitutional: Negative for malaise/fatigue and weight gain.  Cardiovascular:  Positive for dyspnea on exertion (chornic, stable). Negative for chest pain, claudication, leg swelling, near-syncope, orthopnea, palpitations, paroxysmal nocturnal dyspnea and syncope.  Hematologic/Lymphatic: Does not bruise/bleed easily.  Gastrointestinal:  Negative for melena.  Neurological:  Negative for dizziness and weakness.  Objective  Blood pressure 139/69, pulse 70, temperature 98 F (36.7 C), resp. rate 16, height 5\' 6"  (1.676 m), weight 188 lb (85.3 kg), SpO2 94 %.  Vitals with BMI 03/12/2021 09/09/2020 06/09/2020  Height 5\' 6"  5\' 6"  5\' 6"   Weight 188 lbs 191 lbs 6 oz 186 lbs 13 oz  BMI 30.36 48.18 56.31  Systolic 497 026 378  Diastolic 69 65 65  Pulse 70 66 60     Physical Exam Vitals reviewed.  Cardiovascular:     Rate and Rhythm: Normal rate and regular rhythm.     Pulses: Intact distal pulses.          Carotid pulses are  on the right side with bruit and  on the left side with bruit.      Popliteal pulses are 1+ on the right side and 1+ on the left side.       Dorsalis pedis pulses are 1+ on the right side and 1+ on the left side.       Posterior tibial pulses are 0 on the right side and 0 on the left side.     Heart sounds: S1 normal and S2 normal. Murmur heard.  Systolic murmur is present with a grade of 2/6 at the upper right  sternal border.    No gallop.     Comments: No leg edema, no JVD. Left carotid endarterectomy scar has healed well. Right carotid endarterectomy incision healed well.  Pulmonary:     Effort: Pulmonary effort is normal.     Breath sounds: Rhonchi (bilaterally throughout) present.   Laboratory examination:   Recent Labs    04/16/20 1530 04/22/20 1154 04/23/20 0241 10/09/20 1556  NA 138  --  135 137  K 4.3  --  4.3 4.3  CL 104  --  104 105  CO2 23  --  22 23  GLUCOSE 101*  --  157* 106*  BUN 24*  --  24* 27*  CREATININE 1.60* 1.42* 1.54* 1.25  CALCIUM 9.0  --  8.5* 8.9  GFRNONAA 48* 55* 50*  --  CrCl cannot be calculated (Patient's most recent lab result is older than the maximum 21 days allowed.).  CMP Latest Ref Rng & Units 10/09/2020 04/23/2020 04/22/2020  Glucose 65 - 99 mg/dL 106(H) 157(H) -  BUN 7 - 25 mg/dL 27(H) 24(H) -  Creatinine 0.70 - 1.35 mg/dL 1.25 1.54(H) 1.42(H)  Sodium 135 - 146 mmol/L 137 135 -  Potassium 3.5 - 5.3 mmol/L 4.3 4.3 -  Chloride 98 - 110 mmol/L 105 104 -  CO2 20 - 32 mmol/L 23 22 -  Calcium 8.6 - 10.3 mg/dL 8.9 8.5(L) -  Total Protein 6.1 - 8.1 g/dL 6.6 - -  Total Bilirubin 0.2 - 1.2 mg/dL 0.2 - -  Alkaline Phos 38 - 126 U/L - - -  AST 10 - 35 U/L 17 - -  ALT 9 - 46 U/L 16 - -   CBC Latest Ref Rng & Units 10/09/2020 04/23/2020 04/22/2020  WBC 3.8 - 10.8 Thousand/uL 8.7 12.8(H) 10.5  Hemoglobin 13.2 - 17.1 g/dL 12.7(L) 12.5(L) 13.2  Hematocrit 38.5 - 50.0 % 38.3(L) 35.7(L) 39.8  Platelets 140 - 400 Thousand/uL 167 168 164   Lipid Panel No results for input(s): CHOL, TRIG, LDLCALC, VLDL, HDL, CHOLHDL, LDLDIRECT in the last 8760 hours.   HEMOGLOBIN A1C Lab Results  Component Value Date   HGBA1C 6.2 (H) 07/11/2019   MPG 120 05/02/2008   TSH Recent Labs    10/09/20 1556  TSH 1.59    Lipoprotein (a) <75.0 nmol/L 15.1, apolipoprotein B normal at 44.   Date 07/11/2019 Allergies   Allergies  Allergen Reactions   Vicodin  [Hydrocodone-Acetaminophen] Nausea And Vomiting    Medications Prior to Visit:   Outpatient Medications Prior to Visit  Medication Sig Dispense Refill   allopurinol (ZYLOPRIM) 100 MG tablet Take 100 mg by mouth daily.     amLODipine (NORVASC) 10 MG tablet Take 10 mg by mouth every evening.     atorvastatin (LIPITOR) 80 MG tablet TAKE 1 TABLET BY MOUTH DAILY 90 tablet 3   busPIRone (BUSPAR) 5 MG tablet Take 5 mg by mouth 2 (two) times daily as needed.     colchicine 0.6 MG tablet Take 0.6 mg by mouth daily as needed (gout flare).     ELIQUIS 5 MG TABS tablet TAKE 1 TABLET(5 MG) BY MOUTH TWICE DAILY 60 tablet 2   escitalopram (LEXAPRO) 20 MG tablet Take 20 mg by mouth daily.     hydrOXYzine (ATARAX/VISTARIL) 25 MG tablet Take 25 mg by mouth at bedtime.     losartan-hydrochlorothiazide (HYZAAR) 100-12.5 MG tablet Take 1 tablet by mouth daily.      nitroGLYCERIN (NITROSTAT) 0.4 MG SL tablet Place 1 tablet (0.4 mg total) under the tongue every 5 (five) minutes as needed. For chest pain (Patient taking differently: Place 0.4 mg under the tongue every 5 (five) minutes as needed for chest pain.) 30 tablet 0   traZODone (DESYREL) 150 MG tablet Take 150 mg by mouth at bedtime.     VASCEPA 1 g capsule TAKE 2 CAPSULES(2 GRAMS) BY MOUTH TWICE DAILY 120 capsule 1   No facility-administered medications prior to visit.   Final Medications at End of Visit    Current Meds  Medication Sig   allopurinol (ZYLOPRIM) 100 MG tablet Take 100 mg by mouth daily.   amLODipine (NORVASC) 10 MG tablet Take 10 mg by mouth every evening.   atorvastatin (LIPITOR) 80 MG tablet TAKE 1 TABLET BY MOUTH DAILY   busPIRone (BUSPAR) 5 MG tablet  Take 5 mg by mouth 2 (two) times daily as needed.   colchicine 0.6 MG tablet Take 0.6 mg by mouth daily as needed (gout flare).   ELIQUIS 5 MG TABS tablet TAKE 1 TABLET(5 MG) BY MOUTH TWICE DAILY   escitalopram (LEXAPRO) 20 MG tablet Take 20 mg by mouth daily.   hydrOXYzine  (ATARAX/VISTARIL) 25 MG tablet Take 25 mg by mouth at bedtime.   losartan-hydrochlorothiazide (HYZAAR) 100-12.5 MG tablet Take 1 tablet by mouth daily.    metoprolol succinate (TOPROL-XL) 25 MG 24 hr tablet Take 1 tablet (25 mg total) by mouth daily. Take with or immediately following a meal.   nitroGLYCERIN (NITROSTAT) 0.4 MG SL tablet Place 1 tablet (0.4 mg total) under the tongue every 5 (five) minutes as needed. For chest pain (Patient taking differently: Place 0.4 mg under the tongue every 5 (five) minutes as needed for chest pain.)   traZODone (DESYREL) 150 MG tablet Take 150 mg by mouth at bedtime.   VASCEPA 1 g capsule TAKE 2 CAPSULES(2 GRAMS) BY MOUTH TWICE DAILY   Radiology:   CTA head and neck 02/11/2020: 1. Atherosclerotic changes at the bilateral carotid bifurcations. Focal area of bandlike stenosis in the right carotid bifurcation resulting in approximately 90% stenosis. 2. Atherosclerotic changes of the left carotid bifurcation resulting in approximately 70% stenosis. 3. No acute intracranial abnormality. Remote infarcts within the bilateral basal ganglia. Chronic microangiopathy. 4. Emphysema and aortic atherosclerosis.  Cardiac Studies:   Echocardiogram 07/18/2019: Normal LV systolic function with visual EF 50-55%. Left ventricle cavity is normal in size. Moderate left ventricular hypertrophy. Normal global wall motion. Doppler evidence of grade II  diastolic dysfunction, elevated LAP.  Left atrial cavity is mildly dilated. Mild (Grade I) mitral regurgitation. Mild tricuspid regurgitation. Mild pulmonic regurgitation. Prior study dated 02/25/2016 noted LVEF 65%, mild to moderate MR, mild TR.   Carotid artery duplex  12/28/2019: Stenosis in the right internal carotid artery (80-99%). Stenosis in the right external carotid artery (>50%). Stenosis in the left internal carotid artery (80-99%). Stenosis in the left external carotid artery (<50%). The PSV internal/common  carotid artery ratio is consistent with a stenosis of >70% bilaterally, right  7.89 and left 8.67.  Antegrade right vertebral artery flow. Antegrade left vertebral artery flow. Follow up in four months is appropriate if clinically indicated. Peak velocity on the right was 349/117 cm/s and 334/106 cm/s on the left. Compared to the study done on 07/18/2019, there is progression of disease in bilateral internal carotid artery.  Previously right ICA stenosis was 50 to 69% and left was >70%. See attached images.   EKG:  03/12/2021: Sinus rhythm at a rate of 65 bpm.  Normal axis.  Left atrial enlargement.  No evidence of ischemia or underlying injury pattern.  Compared to 09/09/2020, no significant change.  EKG 04/04/2020: Atrial fibrillation with controlled ventricular response at the rate of 67 bpm, normal axis, poor R wave progression, cannot exclude anteroseptal infarct old.  No evidence of ischemia.  No significant change from EKG 03/05/2020.  EKG 01/09/2020: Marked sinus bradycardia at rate of 46 bpm, m normal axis, poor R wave progression, probably normal variant.  No evidence of ischemia.    Assessment     ICD-10-CM   1. Paroxysmal atrial fibrillation (HCC)  I48.0 EKG 12-Lead    2. Primary hypertension  I10     3. Bilateral carotid artery stenosis - s/p bilateral carotid endartarectomy   I65.23     4. Peripheral artery disease ([place])  I73.9      {CHA2DS2-VASc Score is 5.  Yearly risk of stroke: 7.2% (A, HTN, Vasc Dz, CVA).  Score of 1=0.6; 2=2.2; 3=3.2; 4=4.8; 5=7.2; 6=9.8; 7=>9.8) -(CHF; HTN; vasc disease DM,  Male = 1; Age <65 =0; 65-74 = 1,  >75 =2; stroke/embolism= 2).     There are no discontinued medications.   Meds ordered this encounter  Medications   metoprolol succinate (TOPROL-XL) 25 MG 24 hr tablet    Sig: Take 1 tablet (25 mg total) by mouth daily. Take with or immediately following a meal.    Dispense:  90 tablet    Refill:  3    Recommendations:   VIKRANT PRYCE is a  65 y.o.  Caucasian male with history of non-STEMI and angioplasty and stent, peripheral artery disease with interventions to his lower extremity in the remote past in [place], primary hypertension, mixed hyperlipidemia, active smoking, active marijuana use, history of substance abuse remotely, hypertension, history of TIA and excessive alcohol use quit in July 2021, family history of premature coronary artery disease in which his father died at the age of 28 and his mother at age 34 from MI with CABG in her 53s.    Underwent left carotid endarterectomy on 03/04/2020 and was found to have postop atrial fibrillation with RVR.  Underwent successful right carotid endarterectomy 04/08/2020.   Patient presents for 74-month follow-up.  At last office visit ordered ABI given reduced pedal pulses, which was stable with moderately reduced perfusion bilaterally compared to 2017.  During last office visit noted new systolic murmur at the right upper sternal border, therefore obtain echocardiogram.  Echocardiogram unchanged compared to 06/2020 with LVEF 55%, moderate LVH with grade 1 diastolic dysfunction, and moderate MR and mild to moderate tricuspid regurgitation.Since last office visit Lopressor has been discontinued because he ran out.  Notably previously ordered lipid panel has not been done.   Patient agrees to obtain lipid profile testing in the next 1 week. Given mild elevation of blood pressure and history of CAD we will resume metoprolol succinate 25 mg once daily.  Patient remains in asymptomatic, however he is unfortunately resumed smoking.  Spent approximately 5 minutes of today's office visit counseling him regarding tobacco cessation.  Patient's physical exam and EKG remained stable.  Follow-up in 6 months, sooner if needed, for CAD, hypertension, hyperlipidemia, PAD.   Alethia Berthold, PA-C 03/12/2021, 3:50 PM Office: [phone]

## 2021-03-12 ENCOUNTER — Encounter: Payer: Self-pay | Admitting: Student

## 2021-03-12 ENCOUNTER — Ambulatory Visit: Payer: 59 | Admitting: Student

## 2021-03-12 ENCOUNTER — Other Ambulatory Visit: Payer: Self-pay

## 2021-03-12 VITALS — BP 139/69 | HR 70 | Temp 98.0°F | Resp 16 | Ht 66.0 in | Wt 188.0 lb

## 2021-03-12 DIAGNOSIS — I480 Paroxysmal atrial fibrillation: Secondary | ICD-10-CM

## 2021-03-12 DIAGNOSIS — I6523 Occlusion and stenosis of bilateral carotid arteries: Secondary | ICD-10-CM

## 2021-03-12 DIAGNOSIS — I739 Peripheral vascular disease, unspecified: Secondary | ICD-10-CM

## 2021-03-12 DIAGNOSIS — I10 Essential (primary) hypertension: Secondary | ICD-10-CM

## 2021-03-12 MED ORDER — METOPROLOL SUCCINATE ER 25 MG PO TB24: 25.0000 mg | ORAL_TABLET | Freq: Every day | ORAL | 3 refills | Status: DC

## 2021-03-20 ENCOUNTER — Ambulatory Visit: Payer: 59

## 2021-03-20 ENCOUNTER — Encounter (HOSPITAL_COMMUNITY): Payer: 59

## 2021-03-25 LAB — LIPID PANEL WITH LDL/HDL RATIO
Cholesterol, Total: 79 mg/dL — ABNORMAL LOW (ref 100–199)
HDL: 28 mg/dL — ABNORMAL LOW (ref >39)
LDL Chol Calc (NIH): 17 mg/dL (ref 0–99)
LDL/HDL Ratio: 0.6 ratio (ref 0.0–3.6)
Triglycerides: 220 mg/dL — ABNORMAL HIGH (ref 0–149)
VLDL Cholesterol Cal: 34 mg/dL (ref 5–40)

## 2021-04-01 ENCOUNTER — Other Ambulatory Visit: Payer: Self-pay | Admitting: Cardiology

## 2021-04-01 DIAGNOSIS — Z955 Presence of coronary angioplasty implant and graft: Secondary | ICD-10-CM

## 2021-04-01 DIAGNOSIS — I2510 Atherosclerotic heart disease of native coronary artery without angina pectoris: Secondary | ICD-10-CM

## 2021-04-01 DIAGNOSIS — I252 Old myocardial infarction: Secondary | ICD-10-CM

## 2021-04-01 NOTE — Progress Notes (Signed)
Called and spoke to pt, pt states that he does not recall ever being on this medication. I directed the pt to go pick it up from the pharmacy and give [place] a call if he has any issues.

## 2021-04-16 ENCOUNTER — Inpatient Hospital Stay (HOSPITAL_COMMUNITY)
Admission: EM | Admit: 2021-04-16 | Discharge: 2021-05-03 | DRG: 981 | Disposition: A | Discharge status: Home Health | Payer: Medicare Other | Source: Ambulatory Visit | Attending: Internal Medicine | Admitting: Internal Medicine

## 2021-04-16 ENCOUNTER — Emergency Department (HOSPITAL_COMMUNITY): Payer: Medicare Other

## 2021-04-16 ENCOUNTER — Encounter (HOSPITAL_COMMUNITY): Payer: Self-pay

## 2021-04-16 ENCOUNTER — Other Ambulatory Visit: Payer: Self-pay

## 2021-04-16 DIAGNOSIS — Z9889 Other specified postprocedural states: Secondary | ICD-10-CM | POA: Diagnosis not present

## 2021-04-16 DIAGNOSIS — I700 Atherosclerosis of aorta: Secondary | ICD-10-CM | POA: Diagnosis present

## 2021-04-16 DIAGNOSIS — K9181 Other intraoperative complications of digestive system: Secondary | ICD-10-CM | POA: Diagnosis not present

## 2021-04-16 DIAGNOSIS — K559 Vascular disorder of intestine, unspecified: Secondary | ICD-10-CM | POA: Diagnosis present

## 2021-04-16 DIAGNOSIS — Z716 Tobacco abuse counseling: Secondary | ICD-10-CM

## 2021-04-16 DIAGNOSIS — Z7901 Long term (current) use of anticoagulants: Secondary | ICD-10-CM

## 2021-04-16 DIAGNOSIS — R0602 Shortness of breath: Secondary | ICD-10-CM | POA: Diagnosis not present

## 2021-04-16 DIAGNOSIS — F32A Depression, unspecified: Secondary | ICD-10-CM | POA: Diagnosis present

## 2021-04-16 DIAGNOSIS — E1169 Type 2 diabetes mellitus with other specified complication: Secondary | ICD-10-CM | POA: Diagnosis not present

## 2021-04-16 DIAGNOSIS — N179 Acute kidney failure, unspecified: Secondary | ICD-10-CM | POA: Diagnosis present

## 2021-04-16 DIAGNOSIS — E1310 Other specified diabetes mellitus with ketoacidosis without coma: Secondary | ICD-10-CM | POA: Diagnosis not present

## 2021-04-16 DIAGNOSIS — K567 Ileus, unspecified: Secondary | ICD-10-CM | POA: Diagnosis not present

## 2021-04-16 DIAGNOSIS — E119 Type 2 diabetes mellitus without complications: Secondary | ICD-10-CM

## 2021-04-16 DIAGNOSIS — R109 Unspecified abdominal pain: Secondary | ICD-10-CM | POA: Diagnosis not present

## 2021-04-16 DIAGNOSIS — E860 Dehydration: Secondary | ICD-10-CM | POA: Diagnosis present

## 2021-04-16 DIAGNOSIS — Z885 Allergy status to narcotic agent status: Secondary | ICD-10-CM | POA: Diagnosis not present

## 2021-04-16 DIAGNOSIS — Z9861 Coronary angioplasty status: Secondary | ICD-10-CM

## 2021-04-16 DIAGNOSIS — Y838 Other surgical procedures as the cause of abnormal reaction of the patient, or of later complication, without mention of misadventure at the time of the procedure: Secondary | ICD-10-CM | POA: Diagnosis not present

## 2021-04-16 DIAGNOSIS — I6523 Occlusion and stenosis of bilateral carotid arteries: Secondary | ICD-10-CM | POA: Diagnosis not present

## 2021-04-16 DIAGNOSIS — F419 Anxiety disorder, unspecified: Secondary | ICD-10-CM | POA: Diagnosis present

## 2021-04-16 DIAGNOSIS — Z8249 Family history of ischemic heart disease and other diseases of the circulatory system: Secondary | ICD-10-CM | POA: Diagnosis not present

## 2021-04-16 DIAGNOSIS — I10 Essential (primary) hypertension: Secondary | ICD-10-CM | POA: Diagnosis not present

## 2021-04-16 DIAGNOSIS — I150 Renovascular hypertension: Secondary | ICD-10-CM | POA: Diagnosis present

## 2021-04-16 DIAGNOSIS — E785 Hyperlipidemia, unspecified: Secondary | ICD-10-CM | POA: Diagnosis present

## 2021-04-16 DIAGNOSIS — I480 Paroxysmal atrial fibrillation: Secondary | ICD-10-CM | POA: Diagnosis present

## 2021-04-16 DIAGNOSIS — N170 Acute kidney failure with tubular necrosis: Secondary | ICD-10-CM | POA: Diagnosis not present

## 2021-04-16 DIAGNOSIS — M109 Gout, unspecified: Secondary | ICD-10-CM

## 2021-04-16 DIAGNOSIS — I2510 Atherosclerotic heart disease of native coronary artery without angina pectoris: Secondary | ICD-10-CM | POA: Diagnosis present

## 2021-04-16 DIAGNOSIS — Z20822 Contact with and (suspected) exposure to covid-19: Secondary | ICD-10-CM | POA: Diagnosis present

## 2021-04-16 DIAGNOSIS — E1110 Type 2 diabetes mellitus with ketoacidosis without coma: Secondary | ICD-10-CM | POA: Diagnosis present

## 2021-04-16 DIAGNOSIS — N1831 Chronic kidney disease, stage 3a: Secondary | ICD-10-CM | POA: Diagnosis present

## 2021-04-16 DIAGNOSIS — K650 Generalized (acute) peritonitis: Secondary | ICD-10-CM | POA: Diagnosis present

## 2021-04-16 DIAGNOSIS — E1151 Type 2 diabetes mellitus with diabetic peripheral angiopathy without gangrene: Secondary | ICD-10-CM | POA: Diagnosis present

## 2021-04-16 DIAGNOSIS — D696 Thrombocytopenia, unspecified: Secondary | ICD-10-CM

## 2021-04-16 DIAGNOSIS — D649 Anemia, unspecified: Secondary | ICD-10-CM

## 2021-04-16 DIAGNOSIS — K9189 Other postprocedural complications and disorders of digestive system: Secondary | ICD-10-CM | POA: Diagnosis not present

## 2021-04-16 DIAGNOSIS — F17210 Nicotine dependence, cigarettes, uncomplicated: Secondary | ICD-10-CM | POA: Diagnosis present

## 2021-04-16 DIAGNOSIS — Z5331 Laparoscopic surgical procedure converted to open procedure: Secondary | ICD-10-CM

## 2021-04-16 DIAGNOSIS — Z833 Family history of diabetes mellitus: Secondary | ICD-10-CM

## 2021-04-16 DIAGNOSIS — Z79899 Other long term (current) drug therapy: Secondary | ICD-10-CM | POA: Diagnosis not present

## 2021-04-16 DIAGNOSIS — Z8673 Personal history of transient ischemic attack (TIA), and cerebral infarction without residual deficits: Secondary | ICD-10-CM

## 2021-04-16 DIAGNOSIS — K922 Gastrointestinal hemorrhage, unspecified: Secondary | ICD-10-CM | POA: Diagnosis not present

## 2021-04-16 DIAGNOSIS — E876 Hypokalemia: Secondary | ICD-10-CM | POA: Diagnosis present

## 2021-04-16 DIAGNOSIS — I252 Old myocardial infarction: Secondary | ICD-10-CM | POA: Diagnosis not present

## 2021-04-16 DIAGNOSIS — F0390 Unspecified dementia without behavioral disturbance: Secondary | ICD-10-CM | POA: Diagnosis present

## 2021-04-16 DIAGNOSIS — I4891 Unspecified atrial fibrillation: Secondary | ICD-10-CM | POA: Diagnosis not present

## 2021-04-16 DIAGNOSIS — Z8639 Personal history of other endocrine, nutritional and metabolic disease: Secondary | ICD-10-CM

## 2021-04-16 DIAGNOSIS — D500 Iron deficiency anemia secondary to blood loss (chronic): Secondary | ICD-10-CM | POA: Diagnosis not present

## 2021-04-16 DIAGNOSIS — M1990 Unspecified osteoarthritis, unspecified site: Secondary | ICD-10-CM | POA: Diagnosis present

## 2021-04-16 HISTORY — DX: Personal history of other endocrine, nutritional and metabolic disease: Z86.39

## 2021-04-16 HISTORY — DX: Type 2 diabetes mellitus with ketoacidosis without coma: E11.10

## 2021-04-16 LAB — I-STAT VENOUS BLOOD GAS, ED
Acid-base deficit: 14 mmol/L — ABNORMAL HIGH (ref 0.0–2.0)
Bicarbonate: 11.1 mmol/L — ABNORMAL LOW (ref 20.0–28.0)
Calcium, Ion: 0.99 mmol/L — ABNORMAL LOW (ref 1.15–1.40)
HCT: 45 % (ref 39.0–52.0)
Hemoglobin: 15.3 g/dL (ref 13.0–17.0)
O2 Saturation: 89 %
Potassium: 4.4 mmol/L (ref 3.5–5.1)
Sodium: 116 mmol/L — CL (ref 135–145)
TCO2: 12 mmol/L — ABNORMAL LOW (ref 22–32)
pCO2, Ven: 25.6 mmHg — ABNORMAL LOW (ref 44.0–60.0)
pH, Ven: 7.243 — ABNORMAL LOW (ref 7.250–7.430)
pO2, Ven: 65 mmHg — ABNORMAL HIGH (ref 32.0–45.0)

## 2021-04-16 LAB — COMPREHENSIVE METABOLIC PANEL
ALT: 25 U/L (ref 0–44)
AST: 21 U/L (ref 15–41)
Albumin: 3.7 g/dL (ref 3.5–5.0)
Alkaline Phosphatase: 111 U/L (ref 38–126)
Anion gap: 27 — ABNORMAL HIGH (ref 5–15)
BUN: 68 mg/dL — ABNORMAL HIGH (ref 8–23)
CO2: 10 mmol/L — ABNORMAL LOW (ref 22–32)
Calcium: 8.4 mg/dL — ABNORMAL LOW (ref 8.9–10.3)
Chloride: 77 mmol/L — ABNORMAL LOW (ref 98–111)
Creatinine, Ser: 3.46 mg/dL — ABNORMAL HIGH (ref 0.61–1.24)
GFR, Estimated: 19 mL/min — ABNORMAL LOW (ref >60)
Glucose, Bld: 1593 mg/dL (ref 70–99)
Potassium: 4.5 mmol/L (ref 3.5–5.1)
Sodium: 114 mmol/L — CL (ref 135–145)
Total Bilirubin: 1.1 mg/dL (ref 0.3–1.2)
Total Protein: 6.9 g/dL (ref 6.5–8.1)

## 2021-04-16 LAB — URINALYSIS, ROUTINE W REFLEX MICROSCOPIC
Bacteria, UA: NONE SEEN
Bilirubin Urine: NEGATIVE
Glucose, UA: >=500 mg/dL — AB
Ketones, ur: 5 mg/dL — AB
Leukocytes,Ua: NEGATIVE
Nitrite: NEGATIVE
Protein, ur: NEGATIVE mg/dL
Specific Gravity, Urine: 1.021 (ref 1.005–1.030)
pH: 5 (ref 5.0–8.0)

## 2021-04-16 LAB — CBC WITH DIFFERENTIAL/PLATELET
Abs Immature Granulocytes: 0.07 10*3/uL (ref 0.00–0.07)
Basophils Absolute: 0 10*3/uL (ref 0.0–0.1)
Basophils Relative: 0 %
Eosinophils Absolute: 0 10*3/uL (ref 0.0–0.5)
Eosinophils Relative: 0 %
HCT: 45.4 % (ref 39.0–52.0)
Hemoglobin: 14.5 g/dL (ref 13.0–17.0)
Immature Granulocytes: 1 %
Lymphocytes Relative: 8 %
Lymphs Abs: 1.1 10*3/uL (ref 0.7–4.0)
MCH: 33.3 pg (ref 26.0–34.0)
MCHC: 31.9 g/dL (ref 30.0–36.0)
MCV: 104.1 fL — ABNORMAL HIGH (ref 80.0–100.0)
Monocytes Absolute: 0.9 10*3/uL (ref 0.1–1.0)
Monocytes Relative: 7 %
Neutro Abs: 11.1 10*3/uL — ABNORMAL HIGH (ref 1.7–7.7)
Neutrophils Relative %: 84 %
Platelets: 187 10*3/uL (ref 150–400)
RBC: 4.36 MIL/uL (ref 4.22–5.81)
RDW: 12.7 % (ref 11.5–15.5)
WBC: 13.1 10*3/uL — ABNORMAL HIGH (ref 4.0–10.5)
nRBC: 0 % (ref 0.0–0.2)

## 2021-04-16 LAB — CBG MONITORING, ED
Glucose-Capillary: >600 mg/dL (ref 70–99)
Glucose-Capillary: >600 mg/dL (ref 70–99)
Glucose-Capillary: >600 mg/dL (ref 70–99)

## 2021-04-16 LAB — BETA-HYDROXYBUTYRIC ACID: Beta-Hydroxybutyric Acid: 6.91 mmol/L — ABNORMAL HIGH (ref 0.05–0.27)

## 2021-04-16 MED ORDER — METOPROLOL SUCCINATE ER 25 MG PO TB24
25.0000 mg | ORAL_TABLET | Freq: Every day | ORAL | Status: DC
  Administered 2021-04-17 – 2021-04-18 (×2): 25 mg via ORAL
  Filled 2021-04-16 (×2): qty 1

## 2021-04-16 MED ORDER — INSULIN REGULAR(HUMAN) IN NACL 100-0.9 UT/100ML-% IV SOLN
INTRAVENOUS | Status: DC
  Administered 2021-04-16: 4.6 [IU]/h via INTRAVENOUS
  Filled 2021-04-16: qty 100

## 2021-04-16 MED ORDER — AMLODIPINE BESYLATE 5 MG PO TABS
10.0000 mg | ORAL_TABLET | Freq: Every day | ORAL | Status: DC
  Administered 2021-04-17: 10 mg via ORAL
  Filled 2021-04-16: qty 2

## 2021-04-16 MED ORDER — DEXTROSE IN LACTATED RINGERS 5 % IV SOLN: INTRAVENOUS | Status: DC

## 2021-04-16 MED ORDER — TRAZODONE HCL 50 MG PO TABS
100.0000 mg | ORAL_TABLET | Freq: Every day | ORAL | Status: DC
  Administered 2021-04-17 – 2021-05-01 (×15): 100 mg via ORAL
  Filled 2021-04-16 (×16): qty 2

## 2021-04-16 MED ORDER — SODIUM CHLORIDE 0.9 % IV BOLUS
1000.0000 mL | Freq: Once | INTRAVENOUS | Status: AC
  Administered 2021-04-16: 1000 mL via INTRAVENOUS

## 2021-04-16 MED ORDER — DEXTROSE 50 % IV SOLN: 0.0000 mL | INTRAVENOUS | Status: DC | PRN

## 2021-04-16 MED ORDER — ATORVASTATIN CALCIUM 80 MG PO TABS
80.0000 mg | ORAL_TABLET | Freq: Every day | ORAL | Status: DC
  Administered 2021-04-17 – 2021-05-02 (×15): 80 mg via ORAL
  Filled 2021-04-16 (×15): qty 1

## 2021-04-16 MED ORDER — LACTATED RINGERS IV BOLUS
20.0000 mL/kg | Freq: Once | INTRAVENOUS | Status: AC
  Administered 2021-04-16: 1632 mL via INTRAVENOUS

## 2021-04-16 MED ORDER — ESCITALOPRAM OXALATE 20 MG PO TABS
20.0000 mg | ORAL_TABLET | Freq: Every day | ORAL | Status: DC
  Administered 2021-04-17 – 2021-05-03 (×17): 20 mg via ORAL
  Filled 2021-04-16 (×13): qty 1
  Filled 2021-04-16: qty 2
  Filled 2021-04-16 (×3): qty 1

## 2021-04-16 MED ORDER — ALLOPURINOL 100 MG PO TABS
100.0000 mg | ORAL_TABLET | Freq: Every day | ORAL | Status: DC
  Administered 2021-04-17 – 2021-05-03 (×17): 100 mg via ORAL
  Filled 2021-04-16 (×17): qty 1

## 2021-04-16 MED ORDER — ICOSAPENT ETHYL 1 G PO CAPS
2.0000 g | ORAL_CAPSULE | Freq: Two times a day (BID) | ORAL | Status: DC
  Administered 2021-04-17 – 2021-05-03 (×33): 2 g via ORAL
  Filled 2021-04-16 (×34): qty 2

## 2021-04-16 MED ORDER — POTASSIUM CHLORIDE 10 MEQ/100ML IV SOLN
10.0000 meq | INTRAVENOUS | Status: AC
  Administered 2021-04-16 (×2): 10 meq via INTRAVENOUS
  Filled 2021-04-16 (×2): qty 100

## 2021-04-16 MED ORDER — HYDROXYZINE HCL 25 MG PO TABS
25.0000 mg | ORAL_TABLET | Freq: Every evening | ORAL | Status: DC | PRN
  Administered 2021-04-26 – 2021-05-01 (×2): 25 mg via ORAL
  Filled 2021-04-16 (×2): qty 1

## 2021-04-16 MED ORDER — LACTATED RINGERS IV SOLN: INTRAVENOUS | Status: DC

## 2021-04-16 MED ORDER — APIXABAN 5 MG PO TABS
5.0000 mg | ORAL_TABLET | Freq: Two times a day (BID) | ORAL | Status: DC
  Administered 2021-04-17 – 2021-04-19 (×6): 5 mg via ORAL
  Filled 2021-04-16 (×6): qty 1

## 2021-04-16 MED ORDER — BUSPIRONE HCL 5 MG PO TABS
5.0000 mg | ORAL_TABLET | Freq: Two times a day (BID) | ORAL | Status: DC | PRN
  Administered 2021-04-17 – 2021-05-01 (×2): 5 mg via ORAL
  Filled 2021-04-16 (×2): qty 1

## 2021-04-16 NOTE — ED Triage Notes (Addendum)
Pt BIB [facility] c/o hyperglycemia. Per EMS CBG was over 600 and recent A1C was 14. Pt does not have a hx of diabetes. Pt denies any pain but originally went to the doctor for tremors and weakness. Pt has 20g LAC and received a 500 NS bolus with EMS

## 2021-04-16 NOTE — ED Provider Notes (Signed)
[facility] EMERGENCY DEPARTMENT Provider Note   CSN: [id number] Arrival date & time: 04/16/21  1740     History  Chief Complaint  Patient presents with   Hyperglycemia    [name] is a 66 y.o. male.  The history is provided by the patient and medical records. No language interpreter was used.  Hyperglycemia Blood sugar level PTA:  >600 Severity:  Severe Onset quality:  Unable to specify Progression:  Unable to specify Chronicity:  New Diabetes status:  Non-diabetic Relieved by:  Nothing Ineffective treatments:  None tried Associated symptoms: fatigue, increased thirst, nausea and polyuria   Associated symptoms: no abdominal pain, no altered mental status, no chest pain, no confusion, no diaphoresis, no fever, no shortness of breath and no vomiting   Risk factors: family hx of diabetes       Home Medications Prior to Admission medications   Medication Sig Start Date End Date Taking? Authorizing Provider  allopurinol (ZYLOPRIM) 100 MG tablet Take 100 mg by mouth daily.    [provider]  amLODipine (NORVASC) 10 MG tablet Take 10 mg by mouth every evening.    [provider]  atorvastatin (LIPITOR) 80 MG tablet TAKE 1 TABLET BY MOUTH DAILY 10/20/20   Cantwell, Celeste C, PA-C  busPIRone (BUSPAR) 5 MG tablet Take 5 mg by mouth 2 (two) times daily as needed. 02/10/21   [provider]  colchicine 0.6 MG tablet Take 0.6 mg by mouth daily as needed (gout flare).    [provider]  ELIQUIS 5 MG TABS tablet TAKE 1 TABLET(5 MG) BY MOUTH TWICE DAILY 01/23/21   Cantwell, Celeste C, PA-C  escitalopram (LEXAPRO) 20 MG tablet Take 20 mg by mouth daily. 10/19/19   [provider]  hydrOXYzine (ATARAX/VISTARIL) 25 MG tablet Take 25 mg by mouth at bedtime.    [provider]  losartan-hydrochlorothiazide (HYZAAR) 100-12.5 MG tablet Take 1 tablet by mouth daily.     [provider]  metoprolol succinate  (TOPROL-XL) 25 MG 24 hr tablet Take 1 tablet (25 mg total) by mouth daily. Take with or immediately following a meal. 03/12/21 03/07/22  Cantwell, Celeste C, PA-C  nitroGLYCERIN (NITROSTAT) 0.4 MG SL tablet Place 1 tablet (0.4 mg total) under the tongue every 5 (five) minutes as needed. For chest pain Patient taking differently: Place 0.4 mg under the tongue every 5 (five) minutes as needed for chest pain. 10/23/19   Adrian Prows, MD  traZODone (DESYREL) 150 MG tablet Take 150 mg by mouth at bedtime. 01/28/21   [provider]  VASCEPA 1 g capsule TAKE 2 CAPSULES(2 GRAMS) BY MOUTH TWICE DAILY 04/01/21   Cantwell, Celeste C, PA-C  VASCEPA 1 g capsule TAKE 2 CAPSULES(2 GRAMS) BY MOUTH TWICE DAILY 04/01/21   Cantwell, Celeste C, PA-C      Allergies    Vicodin [hydrocodone-acetaminophen]    Review of Systems   Review of Systems  Constitutional:  Positive for fatigue. Negative for chills, diaphoresis and fever.  HENT:  Negative for congestion.   Respiratory:  Negative for cough, chest tightness and shortness of breath.   Cardiovascular:  Negative for chest pain, palpitations and leg swelling.  Gastrointestinal:  Positive for nausea. Negative for abdominal pain, constipation, diarrhea and vomiting.  Endocrine: Positive for polydipsia and polyuria.  Genitourinary:  Negative for flank pain.  Musculoskeletal:  Negative for back pain, neck pain and neck stiffness.  Skin:  Negative for rash and wound.  Neurological:  Negative  for light-headedness.  Psychiatric/Behavioral:  Negative for confusion.   All other systems reviewed and are negative.  Physical Exam Updated Vital Signs BP 126/67    Pulse 98    Temp 98.2 F (36.8 C) (Oral)    Resp (!) 33    Ht 6' (1.829 m)    Wt 81.6 kg    SpO2 99%    BMI 24.41 kg/m  Physical Exam Vitals and nursing note reviewed.  Constitutional:      General: He is not in acute distress.    Appearance: He is well-developed. He is not ill-appearing, toxic-appearing  or diaphoretic.  HENT:     Head: Normocephalic and atraumatic.     Nose: No congestion or rhinorrhea.     Mouth/Throat:     Mouth: Mucous membranes are dry.  Eyes:     Conjunctiva/sclera: Conjunctivae normal.  Cardiovascular:     Rate and Rhythm: Normal rate and regular rhythm.     Heart sounds: No murmur heard. Pulmonary:     Effort: Pulmonary effort is normal. No respiratory distress.     Breath sounds: Normal breath sounds. No wheezing, rhonchi or rales.  Chest:     Chest wall: No tenderness.  Abdominal:     General: Abdomen is flat.     Palpations: Abdomen is soft.     Tenderness: There is no abdominal tenderness. There is no right CVA tenderness, left CVA tenderness, guarding or rebound.  Musculoskeletal:        General: No swelling.     Cervical back: Neck supple. No tenderness.  Skin:    General: Skin is warm and dry.     Capillary Refill: Capillary refill takes less than 2 seconds.     Findings: No erythema.  Neurological:     General: No focal deficit present.     Mental Status: He is alert.     Motor: No weakness.  Psychiatric:        Mood and Affect: Mood normal.    ED Results / Procedures / Treatments   Labs (all labs ordered are listed, but only abnormal results are displayed) Labs Reviewed  CBC WITH DIFFERENTIAL/PLATELET - Abnormal; Notable for the following components:      Result Value   WBC 13.1 (*)    MCV 104.1 (*)    Neutro Abs 11.1 (*)    All other components within normal limits  COMPREHENSIVE METABOLIC PANEL - Abnormal; Notable for the following components:   Sodium 114 (*)    Chloride 77 (*)    CO2 10 (*)    Glucose, Bld 1,593 (*)    BUN 68 (*)    Creatinine, Ser 3.46 (*)    Calcium 8.4 (*)    GFR, Estimated 19 (*)    Anion gap 27 (*)    All other components within normal limits  URINALYSIS, ROUTINE W REFLEX MICROSCOPIC - Abnormal; Notable for the following components:   Color, Urine STRAW (*)    Glucose, UA >=500 (*)    Hgb urine  dipstick MODERATE (*)    Ketones, ur 5 (*)    All other components within normal limits  BASIC METABOLIC PANEL - Abnormal; Notable for the following components:   Sodium 116 (*)    Potassium 5.2 (*)    Chloride 77 (*)    CO2 11 (*)    Glucose, Bld 1,587 (*)    BUN 69 (*)    Creatinine, Ser 3.49 (*)    Calcium 8.3 (*)  GFR, Estimated 19 (*)    Anion gap 28 (*)    All other components within normal limits  CBG MONITORING, ED - Abnormal; Notable for the following components:   Glucose-Capillary >600 (*)    All other components within normal limits  I-STAT VENOUS BLOOD GAS, ED - Abnormal; Notable for the following components:   pH, Ven 7.243 (*)    pCO2, Ven 25.6 (*)    pO2, Ven 65.0 (*)    Bicarbonate 11.1 (*)    TCO2 12 (*)    Acid-base deficit 14.0 (*)    Sodium 116 (*)    Calcium, Ion 0.99 (*)    All other components within normal limits  CBG MONITORING, ED - Abnormal; Notable for the following components:   Glucose-Capillary >600 (*)    All other components within normal limits  CBG MONITORING, ED - Abnormal; Notable for the following components:   Glucose-Capillary >600 (*)    All other components within normal limits  RESP PANEL BY RT-PCR (FLU A&B, COVID) ARPGX2  BASIC METABOLIC PANEL  BASIC METABOLIC PANEL  BASIC METABOLIC PANEL  BETA-HYDROXYBUTYRIC ACID  BETA-HYDROXYBUTYRIC ACID  HIV ANTIBODY (ROUTINE TESTING W REFLEX)  HEMOGLOBIN A1C  OSMOLALITY  BASIC METABOLIC PANEL  BETA-HYDROXYBUTYRIC ACID  URINALYSIS, COMPLETE (UACMP) WITH MICROSCOPIC    EKG EKG Interpretation  Date/Time:  Thursday April 16 2021 20:39:36 EST Ventricular Rate:  101 PR Interval:  166 QRS Duration: 90 QT Interval:  356 QTC Calculation: 461 R Axis:   46 Text Interpretation: Sinus tachycardia Otherwise normal ECG When compared with ECG of 05-Mar-2020 10:39, PREVIOUS ECG IS PRESENT When compared to prior, faster rate and now appears sinus. NO STEMI Confirmed by Antony Blackbird [phone])  on 04/16/2021 10:15:20 PM  Radiology DG Chest 2 View  Result Date: 04/16/2021 CLINICAL DATA:  Tremors, weakness, short of breath EXAM: CHEST - 2 VIEW COMPARISON:  04/07/2016 FINDINGS: Frontal and lateral views of the chest demonstrate an unremarkable cardiac silhouette. No acute airspace disease, effusion, or pneumothorax. No acute bony abnormalities. IMPRESSION: 1. No acute intrathoracic process. Electronically Signed   By: Randa Ngo M.D.   On: 04/16/2021 21:30    Procedures Procedures    CRITICAL CARE Performed by: Gwenyth Allegra Khadija Thier Total critical care time: 35 minutes Critical care time was exclusive of separately billable procedures and treating other patients. Critical care was necessary to treat or prevent imminent or life-threatening deterioration. Critical care was time spent personally by me on the following activities: development of treatment plan with patient and/or surrogate as well as nursing, discussions with consultants, evaluation of patient's response to treatment, examination of patient, obtaining history from patient or surrogate, ordering and performing treatments and interventions, ordering and review of laboratory studies, ordering and review of radiographic studies, pulse oximetry and re-evaluation of patient's condition.    Medications Ordered in ED Medications  insulin regular, human (MYXREDLIN) 100 units/ 100 mL infusion (4.6 Units/hr Intravenous Rate/Dose Change 04/16/21 2335)  lactated ringers infusion (has no administration in time range)  dextrose 5 % in lactated ringers infusion (has no administration in time range)  dextrose 50 % solution 0-50 mL (has no administration in time range)  potassium chloride 10 mEq in 100 mL IVPB (10 mEq Intravenous New Bag/Given 04/16/21 2339)  dextrose 50 % solution 0-50 mL (has no administration in time range)  apixaban (ELIQUIS) tablet 5 mg (has no administration in time range)  metoprolol succinate (TOPROL-XL) 24 hr  tablet 25 mg (has no administration in time  range)  traZODone (DESYREL) tablet 100 mg (has no administration in time range)  icosapent Ethyl (VASCEPA) 1 g capsule 2 g (has no administration in time range)  atorvastatin (LIPITOR) tablet 80 mg (has no administration in time range)  escitalopram (LEXAPRO) tablet 20 mg (has no administration in time range)  hydrOXYzine (ATARAX) tablet 25 mg (has no administration in time range)  busPIRone (BUSPAR) tablet 5 mg (has no administration in time range)  allopurinol (ZYLOPRIM) tablet 100 mg (has no administration in time range)  amLODipine (NORVASC) tablet 10 mg (has no administration in time range)  sodium chloride 0.9 % bolus 1,000 mL (0 mLs Intravenous Stopped 04/16/21 2330)  lactated ringers bolus 1,632 mL (1,632 mLs Intravenous New Bag/Given 04/16/21 2255)    ED Course/ Medical Decision Making/ A&P                           Medical Decision Making Amount and/or Complexity of Data Reviewed Labs: ordered.  Risk Prescription drug management. Decision regarding hospitalization.   JARRIEL PAPILLION is a 66 y.o. male with a past medical history significant for CAD with previous NSTEMI, TIA, hypertension, hyperlipidemia, and a family history of diabetes who presents with fatigue, nausea, thirst, and polyuria.  According to patient, for the last week he has been feeling ill and fatigued and having nausea but no vomiting.  He reports he has been urinating a significant amount but not as much today.  He reports a family history of diabetes but no personal history of any glucose problems.  He denies any pain including no to headache, chest pain, or abdominal pain.  No constipation or diarrhea reported.  No recent fevers or chills.  Per EMS, he was found to be hyperglycemic with glucose over 600.  EMS gave him some fluids.  On my exam, patient moving all extremities.  Dry mucous membranes.  Lungs are clear and chest and abdomen are nontender.  Normal bowel  sounds.  Patient is having reassuring vital signs on my exam.  Patient's work-up confirmed DKA.  He was ordered some fluids initially and then glucose stabilizer and further labs.  This appears to be first onset of diabetes.  Patient will be admitted for further management of diabetes and new DKA.         Final Clinical Impression(s) / ED Diagnoses Final diagnoses:  Diabetic ketoacidosis without coma associated with other specified diabetes mellitus ([place])     Clinical Impression: 1. Diabetic ketoacidosis without coma associated with other specified diabetes mellitus ([place])     Disposition: Admit  This note was prepared with assistance of Dragon voice recognition software. Occasional wrong-word or sound-a-like substitutions may have occurred due to the inherent limitations of voice recognition software.     Noelene Gang, Gwenyth Allegra, MD 04/17/21 [phone]

## 2021-04-16 NOTE — ED Notes (Signed)
Wife Laszlo Ellerby [phone] would like an update

## 2021-04-16 NOTE — ED Provider Triage Note (Signed)
Emergency Medicine Provider Triage Evaluation Note  [name] , a 66 y.o. male  was evaluated in triage.  Pt complains of weakness.  Was seen at primary care earlier today, point-of-care glucose showed greater than 600 so he was sent here.  No history of diabetes, patient feels sluggish..  Review of Systems  Positive: WEAKNESS, HYPERGLYCEMIA Negative:   Physical Exam  BP 126/73 (BP Location: Left Arm)    Pulse 100    Temp 98.2 F (36.8 C) (Oral)    Resp 18    Ht 6' (1.829 m)    Wt 81.6 kg    SpO2 99%    BMI 24.41 kg/m  Gen:   Awake, no distress   Resp:  Normal effort  MSK:   Moves extremities without difficulty  Other:    Medical Decision Making  Medically screening exam initiated at 8:39 PM.  Appropriate orders placed.  [name] was informed that the remainder of the evaluation will be completed by another provider, this initial triage assessment does not replace that evaluation, and the importance of remaining in the ED until their evaluation is complete.  New onset of diabetes, DKA?   Evan Raring, PA-C 04/16/21 2041

## 2021-04-16 NOTE — H&P (Signed)
History and Physical    [name] ZOX:[id number] DOB: [birth year] DOA: 04/16/2021  PCP: [name], MD  Patient coming from: Home  I have personally briefly reviewed patient's old medical records in [place]  Chief Complaint: Hyperglycemia  HPI: [name] is a 66 y.o. male with medical history significant of CAD, HTN.  Pt with no h/o DM.  Pt presents to ED from PCP office today with BGL > 600, A1C of 14.    Tremors, weakness, polyuria and polydipsia for past couple of weeks.  ED Course: pt found to have DKA from new onset DM with BGL 1587  AG 28 with BHB 6.9 and pH 7.24  Has AKI with creat 3.49 up from 1.25 in July 2022.   Past Medical History:  Diagnosis Date   AKI (acute kidney injury) ([place]) 12/2015   Anxiety    Arthritis    Carotid artery stenosis    Coronary artery disease    Dementia ([place])    problems with short term memory   Depression    DKA (diabetic ketoacidosis) ([place]) 04/16/2021   Dysrhythmia    episode of afib 12/2005 in setting of cocaine use   Gout    Hyperlipidemia    Hypertension    Myocardial infarction [facility])    2017   PAD (peripheral artery disease) (HCC)    Stroke (HCC)    TIA   TIA (transient ischemic attack)    over 20 years ago    Past Surgical History:  Procedure Laterality Date   CARDIAC CATHETERIZATION N/A 12/18/2015   Procedure: Left Heart Cath and Coronary Angiography;  Surgeon: Adrian Prows, MD;  Location: [place] CV LAB;  Service: Cardiovascular;  Laterality: N/A;   CARDIAC CATHETERIZATION N/A 12/18/2015   Procedure: Coronary Stent Intervention;  Surgeon: Adrian Prows, MD;  Location: [place] CV LAB;  Service: Cardiovascular;  Laterality: N/A;   CARDIAC CATHETERIZATION N/A 12/18/2015   Procedure: Coronary Balloon Angioplasty;  Surgeon: Adrian Prows, MD;  Location: [place] CV LAB;  Service: Cardiovascular;  Laterality: N/A;   COLONOSCOPY N/A 01/12/2016   Procedure: COLONOSCOPY;  Surgeon: Manus Gunning,  MD;  Location: [facility] ENDOSCOPY;  Service: Gastroenterology;  Laterality: N/A;   ENDARTERECTOMY Left 03/04/2020   Procedure: LEFT ENDARTERECTOMY CAROTID;  Surgeon: Rosetta Posner, MD;  Location: [facility] OR;  Service: Vascular;  Laterality: Left;   ENDARTERECTOMY Right 04/22/2020   Procedure: RIGHT CAROTID ENDARTERECTOMY;  Surgeon: Rosetta Posner, MD;  Location: [place];  Service: Vascular;  Laterality: Right;   HERNIA REPAIR     PATCH ANGIOPLASTY Left 03/04/2020   Procedure: PATCH ANGIOPLASTY;  Surgeon: Rosetta Posner, MD;  Location: [place];  Service: Vascular;  Laterality: Left;   PERIPHERAL VASCULAR CATHETERIZATION N/A 12/18/2015   Procedure: Abdominal Aortogram;  Surgeon: Adrian Prows, MD;  Location: [place] CV LAB;  Service: Cardiovascular;  Laterality: N/A;     reports that he has been smoking cigarettes. He has a 21.00 pack-year smoking history. He has never used smokeless tobacco. He reports that he does not currently use alcohol. He reports current drug use. Drug: Marijuana.  Allergies  Allergen Reactions   Vicodin [Hydrocodone-Acetaminophen] Nausea And Vomiting    Family History  Problem Relation Age of Onset   Heart attack Mother    Heart attack Father    IgA nephropathy Son     Prior to Admission medications   Medication Sig Start Date End Date Taking? Authorizing Provider  allopurinol (ZYLOPRIM) 100 MG  tablet Take 100 mg by mouth daily.   Yes [provider]  amLODipine (NORVASC) 10 MG tablet Take 10 mg by mouth daily.   Yes [provider]  atorvastatin (LIPITOR) 80 MG tablet TAKE 1 TABLET BY MOUTH DAILY Patient taking differently: Take 80 mg by mouth at bedtime. 10/20/20  Yes Cantwell, Celeste C, PA-C  busPIRone (BUSPAR) 5 MG tablet Take 5 mg by mouth 2 (two) times daily as needed (anxiety). 02/10/21  Yes [provider]  colchicine 0.6 MG tablet Take 0.6 mg by mouth daily as needed (gout flare).   Yes [provider]  ELIQUIS 5 MG TABS tablet TAKE 1  TABLET(5 MG) BY MOUTH TWICE DAILY Patient taking differently: Take 5 mg by mouth 2 (two) times daily. 01/23/21  Yes Cantwell, Celeste C, PA-C  escitalopram (LEXAPRO) 20 MG tablet Take 20 mg by mouth daily. 10/19/19  Yes [provider]  hydrOXYzine (ATARAX/VISTARIL) 25 MG tablet Take 25 mg by mouth at bedtime as needed (sleep).   Yes [provider]  losartan-hydrochlorothiazide (HYZAAR) 100-12.5 MG tablet Take 1 tablet by mouth daily.    Yes [provider]  metoprolol succinate (TOPROL-XL) 25 MG 24 hr tablet Take 1 tablet (25 mg total) by mouth daily. Take with or immediately following a meal. 03/12/21 03/07/22 Yes Cantwell, Celeste C, PA-C  nitroGLYCERIN (NITROSTAT) 0.4 MG SL tablet Place 1 tablet (0.4 mg total) under the tongue every 5 (five) minutes as needed. For chest pain Patient taking differently: Place 0.4 mg under the tongue every 5 (five) minutes as needed for chest pain. 10/23/19  Yes Adrian Prows, MD  traZODone (DESYREL) 100 MG tablet Take 100 mg by mouth at bedtime. 01/28/21  Yes [provider]  VASCEPA 1 g capsule TAKE 2 CAPSULES(2 GRAMS) BY MOUTH TWICE DAILY Patient taking differently: Take 2 g by mouth 2 (two) times daily. 04/01/21  Yes Alethia Berthold, PA-C    Physical Exam: Vitals:   04/16/21 2133 04/16/21 2209 04/16/21 2215 04/16/21 2245  BP: (!) 117/51 126/67 125/78 139/69  Pulse: 97 98 92 86  Resp: (!) 21 (!) 33 19 20  Temp:      TempSrc:      SpO2: 97% 99% 98% 98%  Weight:      Height:        Constitutional: NAD, calm, comfortable Eyes: PERRL, lids and conjunctivae normal ENMT: Mucous membranes are moist. Posterior pharynx clear of any exudate or lesions.Normal dentition.  Neck: normal, supple, no masses, no thyromegaly Respiratory: clear to auscultation bilaterally, no wheezing, no crackles. Normal respiratory effort. No accessory muscle use.  Cardiovascular: Regular rate and rhythm, no murmurs / rubs / gallops. No extremity  edema. 2+ pedal pulses. No carotid bruits.  Abdomen: no tenderness, no masses palpated. No hepatosplenomegaly. Bowel sounds positive.  Musculoskeletal: no clubbing / cyanosis. No joint deformity upper and lower extremities. Good ROM, no contractures. Normal muscle tone.  Skin: no rashes, lesions, ulcers. No induration Neurologic: CN 2-12 grossly intact. Sensation intact, DTR normal. Strength 5/5 in all 4.  Psychiatric: Normal judgment and insight. Alert and oriented x 3. Normal mood.    Labs on Admission: I have personally reviewed following labs and imaging studies  CBC: Recent Labs  Lab 04/16/21 2043 04/16/21 2051  WBC 13.1*  --   NEUTROABS 11.1*  --   HGB 14.5 15.3  HCT 45.4 45.0  MCV 104.1*  --   PLT 187  --    Basic Metabolic Panel: Recent  Labs  Lab 04/16/21 2043 04/16/21 2051  NA 114* 116*  K 4.5 4.4  CL 77*  --   CO2 10*  --   GLUCOSE 1,593*  --   BUN 68*  --   CREATININE 3.46*  --   CALCIUM 8.4*  --    GFR: Estimated Creatinine Clearance: 23.1 mL/min (A) (by C-G formula based on SCr of 3.46 mg/dL (H)). Liver Function Tests: Recent Labs  Lab 04/16/21 2043  AST 21  ALT 25  ALKPHOS 111  BILITOT 1.1  PROT 6.9  ALBUMIN 3.7   No results for input(s): LIPASE, AMYLASE in the last 168 hours. No results for input(s): AMMONIA in the last 168 hours. Coagulation Profile: No results for input(s): INR, PROTIME in the last 168 hours. Cardiac Enzymes: No results for input(s): CKTOTAL, CKMB, CKMBINDEX, TROPONINI in the last 168 hours. BNP (last 3 results) No results for input(s): PROBNP in the last 8760 hours. HbA1C: No results for input(s): HGBA1C in the last 72 hours. CBG: Recent Labs  Lab 04/16/21 2022 04/16/21 2212 04/16/21 2334  GLUCAP >600* >600* >600*   Lipid Profile: No results for input(s): CHOL, HDL, LDLCALC, TRIG, CHOLHDL, LDLDIRECT in the last 72 hours. Thyroid Function Tests: No results for input(s): TSH, T4TOTAL, FREET4, T3FREE, THYROIDAB in  the last 72 hours. Anemia Panel: No results for input(s): VITAMINB12, FOLATE, FERRITIN, TIBC, IRON, RETICCTPCT in the last 72 hours. Urine analysis:    Component Value Date/Time   COLORURINE STRAW (A) 04/16/2021 2030   APPEARANCEUR CLEAR 04/16/2021 2030   LABSPEC 1.021 04/16/2021 2030   PHURINE 5.0 04/16/2021 2030   GLUCOSEU >=500 (A) 04/16/2021 2030   HGBUR MODERATE (A) 04/16/2021 2030   [place] NEGATIVE 04/16/2021 2030   KETONESUR 5 (A) 04/16/2021 2030   PROTEINUR NEGATIVE 04/16/2021 2030   UROBILINOGEN 0.2 05/01/2008 1735   NITRITE NEGATIVE 04/16/2021 2030   LEUKOCYTESUR NEGATIVE 04/16/2021 2030    Radiological Exams on Admission: DG Chest 2 View  Result Date: 04/16/2021 CLINICAL DATA:  Tremors, weakness, short of breath EXAM: CHEST - 2 VIEW COMPARISON:  04/07/2016 FINDINGS: Frontal and lateral views of the chest demonstrate an unremarkable cardiac silhouette. No acute airspace disease, effusion, or pneumothorax. No acute bony abnormalities. IMPRESSION: 1. No acute intrathoracic process. Electronically Signed   By: Randa Ngo M.D.   On: 04/16/2021 21:30    EKG: Independently reviewed.  Assessment/Plan Principal Problem:   DKA (diabetic ketoacidosis) ([place]) Active Problems:   Acute renal failure (ARF) (HCC)   Renovascular hypertension   New onset type 2 diabetes mellitus (HCC)   Paroxysmal atrial fibrillation (HCC)    DKA, new onset DM2 - More  of a "mixed picture" between DKA and HHS Checking OSM (calculated osm is over 340) DKA pathway IVF 2.6L bolus then 125 cc/hr Insulin gtt BMP Q4H AKI - Suspect pre-renal ATN secondary to DKA Strict intake and output Ordering UA Serial BMPs If not rapidly improving will need further workup HTN - Cont BB and CCB Holding lisinopril-HCTZ due to AKI CAD - Cont statin PAF - Cont BB Cont eliquis  DVT prophylaxis: Eliquis Code Status: Full Family Communication: No family in room Disposition Plan: Home after DKA  resolved and AKI improved Consults called: None Admission status: Admit to inpatient  Severity of Illness: The appropriate patient status for this patient is INPATIENT. Inpatient status is judged to be reasonable and necessary in order to provide the required intensity of service to ensure the patient's safety. The patient's presenting symptoms, physical  exam findings, and initial radiographic and laboratory data in the context of their chronic comorbidities is felt to place them at high risk for further clinical deterioration. Furthermore, it is not anticipated that the patient will be medically stable for discharge from the hospital within 2 midnights of admission.   * I certify that at the point of admission it is my clinical judgment that the patient will require inpatient hospital care spanning beyond 2 midnights from the point of admission due to high intensity of service, high risk for further deterioration and high frequency of surveillance required.*   Huber Mathers M. DO [facility]  How to contact the [facility] Attending or Consulting provider [place] or covering provider during after hours [place], for this patient?  Check the care team in [facility] and look for a) attending/consulting TRH provider listed and b) the [facility] team listed Log into [email or web address]  Amion Physician Scheduling and messaging for groups and whole hospitals  On call and physician scheduling software for group practices, residents, hospitalists and other medical providers for call, clinic, rotation and shift schedules. OnCall Enterprise is a hospital-wide system for scheduling doctors and paging doctors on call. EasyPlot is for scientific plotting and data analysis.  [email or web address]  and use [place]'s universal password to access. If you do not have the password, please contact the hospital operator.  Locate the [facility] provider you are looking for under [facility] and page to a number that you can be directly  reached. If you still have difficulty reaching the provider, please page the [facility] (Director on Call) for the Hospitalists listed on amion for assistance.  04/16/2021, 11:54 PM

## 2021-04-17 DIAGNOSIS — E1110 Type 2 diabetes mellitus with ketoacidosis without coma: Secondary | ICD-10-CM | POA: Diagnosis present

## 2021-04-17 DIAGNOSIS — I480 Paroxysmal atrial fibrillation: Secondary | ICD-10-CM | POA: Diagnosis present

## 2021-04-17 DIAGNOSIS — N170 Acute kidney failure with tubular necrosis: Secondary | ICD-10-CM | POA: Diagnosis not present

## 2021-04-17 LAB — BASIC METABOLIC PANEL
Anion gap: 28 — ABNORMAL HIGH (ref 5–15)
Anion gap: 16 — ABNORMAL HIGH (ref 5–15)
Anion gap: 13 (ref 5–15)
Anion gap: 17 — ABNORMAL HIGH (ref 5–15)
Anion gap: 7 (ref 5–15)
BUN: 69 mg/dL — ABNORMAL HIGH (ref 8–23)
BUN: 60 mg/dL — ABNORMAL HIGH (ref 8–23)
BUN: 53 mg/dL — ABNORMAL HIGH (ref 8–23)
BUN: 54 mg/dL — ABNORMAL HIGH (ref 8–23)
BUN: 40 mg/dL — ABNORMAL HIGH (ref 8–23)
CO2: 11 mmol/L — ABNORMAL LOW (ref 22–32)
CO2: 18 mmol/L — ABNORMAL LOW (ref 22–32)
CO2: 21 mmol/L — ABNORMAL LOW (ref 22–32)
CO2: 19 mmol/L — ABNORMAL LOW (ref 22–32)
CO2: 26 mmol/L (ref 22–32)
Calcium: 8.3 mg/dL — ABNORMAL LOW (ref 8.9–10.3)
Calcium: 8.5 mg/dL — ABNORMAL LOW (ref 8.9–10.3)
Calcium: 8.9 mg/dL (ref 8.9–10.3)
Calcium: 9.1 mg/dL (ref 8.9–10.3)
Calcium: 8.6 mg/dL — ABNORMAL LOW (ref 8.9–10.3)
Chloride: 77 mmol/L — ABNORMAL LOW (ref 98–111)
Chloride: 93 mmol/L — ABNORMAL LOW (ref 98–111)
Chloride: 99 mmol/L (ref 98–111)
Chloride: 99 mmol/L (ref 98–111)
Chloride: 105 mmol/L (ref 98–111)
Creatinine, Ser: 3.49 mg/dL — ABNORMAL HIGH (ref 0.61–1.24)
Creatinine, Ser: 2.76 mg/dL — ABNORMAL HIGH (ref 0.61–1.24)
Creatinine, Ser: 2.15 mg/dL — ABNORMAL HIGH (ref 0.61–1.24)
Creatinine, Ser: 2.12 mg/dL — ABNORMAL HIGH (ref 0.61–1.24)
Creatinine, Ser: 1.76 mg/dL — ABNORMAL HIGH (ref 0.61–1.24)
GFR, Estimated: 19 mL/min — ABNORMAL LOW (ref >60)
GFR, Estimated: 25 mL/min — ABNORMAL LOW (ref >60)
GFR, Estimated: 33 mL/min — ABNORMAL LOW (ref >60)
GFR, Estimated: 34 mL/min — ABNORMAL LOW (ref >60)
GFR, Estimated: 42 mL/min — ABNORMAL LOW (ref >60)
Glucose, Bld: 1587 mg/dL (ref 70–99)
Glucose, Bld: 1085 mg/dL (ref 70–99)
Glucose, Bld: 645 mg/dL (ref 70–99)
Glucose, Bld: 652 mg/dL (ref 70–99)
Glucose, Bld: 224 mg/dL — ABNORMAL HIGH (ref 70–99)
Potassium: 5.2 mmol/L — ABNORMAL HIGH (ref 3.5–5.1)
Potassium: 4.2 mmol/L (ref 3.5–5.1)
Potassium: 3.4 mmol/L — ABNORMAL LOW (ref 3.5–5.1)
Potassium: 3.4 mmol/L — ABNORMAL LOW (ref 3.5–5.1)
Potassium: 3.3 mmol/L — ABNORMAL LOW (ref 3.5–5.1)
Sodium: 116 mmol/L — CL (ref 135–145)
Sodium: 127 mmol/L — ABNORMAL LOW (ref 135–145)
Sodium: 133 mmol/L — ABNORMAL LOW (ref 135–145)
Sodium: 135 mmol/L (ref 135–145)
Sodium: 138 mmol/L (ref 135–145)

## 2021-04-17 LAB — CBG MONITORING, ED
Glucose-Capillary: 190 mg/dL — ABNORMAL HIGH (ref 70–99)
Glucose-Capillary: 260 mg/dL — ABNORMAL HIGH (ref 70–99)
Glucose-Capillary: 295 mg/dL — ABNORMAL HIGH (ref 70–99)
Glucose-Capillary: 337 mg/dL — ABNORMAL HIGH (ref 70–99)
Glucose-Capillary: 384 mg/dL — ABNORMAL HIGH (ref 70–99)
Glucose-Capillary: 412 mg/dL — ABNORMAL HIGH (ref 70–99)
Glucose-Capillary: 424 mg/dL — ABNORMAL HIGH (ref 70–99)
Glucose-Capillary: 471 mg/dL — ABNORMAL HIGH (ref 70–99)
Glucose-Capillary: 527 mg/dL (ref 70–99)
Glucose-Capillary: 581 mg/dL (ref 70–99)
Glucose-Capillary: 585 mg/dL (ref 70–99)
Glucose-Capillary: >600 mg/dL (ref 70–99)
Glucose-Capillary: >600 mg/dL (ref 70–99)
Glucose-Capillary: >600 mg/dL (ref 70–99)
Glucose-Capillary: >600 mg/dL (ref 70–99)
Glucose-Capillary: >600 mg/dL (ref 70–99)
Glucose-Capillary: >600 mg/dL (ref 70–99)
Glucose-Capillary: >600 mg/dL (ref 70–99)
Glucose-Capillary: >600 mg/dL (ref 70–99)
Glucose-Capillary: >600 mg/dL (ref 70–99)

## 2021-04-17 LAB — HIV ANTIBODY (ROUTINE TESTING W REFLEX): HIV Screen 4th Generation wRfx: NONREACTIVE

## 2021-04-17 LAB — RESP PANEL BY RT-PCR (FLU A&B, COVID) ARPGX2
Influenza A by PCR: NEGATIVE
Influenza B by PCR: NEGATIVE
SARS Coronavirus 2 by RT PCR: NEGATIVE

## 2021-04-17 LAB — OSMOLALITY: Osmolality: 350 mOsm/kg (ref 275–295)

## 2021-04-17 LAB — MAGNESIUM: Magnesium: 2.4 mg/dL (ref 1.7–2.4)

## 2021-04-17 LAB — GLUCOSE, CAPILLARY: Glucose-Capillary: 273 mg/dL — ABNORMAL HIGH (ref 70–99)

## 2021-04-17 MED ORDER — INSULIN ASPART 100 UNIT/ML IJ SOLN: 3.0000 [IU] | Freq: Three times a day (TID) | INTRAMUSCULAR | Status: DC

## 2021-04-17 MED ORDER — INSULIN GLARGINE-YFGN 100 UNIT/ML ~~LOC~~ SOLN
12.0000 [IU] | Freq: Every day | SUBCUTANEOUS | Status: DC
  Administered 2021-04-17: 12 [IU] via SUBCUTANEOUS
  Filled 2021-04-17: qty 0.12

## 2021-04-17 MED ORDER — LIVING WELL WITH DIABETES BOOK
Freq: Once | Status: DC
  Filled 2021-04-17 (×2): qty 1

## 2021-04-17 MED ORDER — LACTATED RINGERS IV SOLN: INTRAVENOUS | Status: DC

## 2021-04-17 MED ORDER — PANTOPRAZOLE SODIUM 40 MG PO TBEC
40.0000 mg | DELAYED_RELEASE_TABLET | Freq: Every day | ORAL | Status: DC
  Administered 2021-04-17 – 2021-05-03 (×17): 40 mg via ORAL
  Filled 2021-04-17 (×18): qty 1

## 2021-04-17 MED ORDER — POTASSIUM CHLORIDE CRYS ER 20 MEQ PO TBCR
20.0000 meq | EXTENDED_RELEASE_TABLET | Freq: Once | ORAL | Status: AC
  Administered 2021-04-17: 20 meq via ORAL
  Filled 2021-04-17: qty 1

## 2021-04-17 MED ORDER — INSULIN ASPART 100 UNIT/ML IJ SOLN
0.0000 [IU] | Freq: Three times a day (TID) | INTRAMUSCULAR | Status: DC
  Administered 2021-04-17 – 2021-04-18 (×3): 8 [IU] via SUBCUTANEOUS
  Administered 2021-04-18: 3 [IU] via SUBCUTANEOUS
  Administered 2021-04-19: 2 [IU] via SUBCUTANEOUS

## 2021-04-17 MED ORDER — INSULIN STARTER KIT- PEN NEEDLES (ENGLISH)
1.0000 | Freq: Once | Status: DC
  Filled 2021-04-17 (×2): qty 1

## 2021-04-17 MED ORDER — INSULIN ASPART 100 UNIT/ML IJ SOLN
0.0000 [IU] | Freq: Every day | INTRAMUSCULAR | Status: DC
  Administered 2021-04-17: 3 [IU] via SUBCUTANEOUS

## 2021-04-17 MED ORDER — POTASSIUM CHLORIDE CRYS ER 20 MEQ PO TBCR
40.0000 meq | EXTENDED_RELEASE_TABLET | Freq: Once | ORAL | Status: AC
Start: 2021-04-17 — End: 2021-04-17
  Administered 2021-04-17: 40 meq via ORAL
  Filled 2021-04-17: qty 2

## 2021-04-17 MED ORDER — LACTATED RINGERS IV BOLUS
1000.0000 mL | Freq: Once | INTRAVENOUS | Status: AC
  Administered 2021-04-17: 1000 mL via INTRAVENOUS

## 2021-04-17 MED ORDER — LACTATED RINGERS IV BOLUS
500.0000 mL | Freq: Once | INTRAVENOUS | Status: AC
  Administered 2021-04-17: 500 mL via INTRAVENOUS

## 2021-04-17 MED ORDER — INSULIN GLARGINE-YFGN 100 UNIT/ML ~~LOC~~ SOLN
30.0000 [IU] | Freq: Every day | SUBCUTANEOUS | Status: DC
  Administered 2021-04-17: 30 [IU] via SUBCUTANEOUS
  Filled 2021-04-17 (×2): qty 0.3

## 2021-04-17 NOTE — ED Notes (Signed)
Pt assisted with phone to call his wife

## 2021-04-17 NOTE — ED Notes (Signed)
MD made aware of pts hypotension, new orders placed

## 2021-04-17 NOTE — Progress Notes (Signed)
PROGRESS NOTE                                                                                                                                                                                                             Patient Demographics:    [name], is a 66 y.o. male, DOB - 1955-12-03, ZOX:[id number]  Outpatient Primary MD for the patient is Evan Ebbs, MD    LOS - 1  Admit date - 04/16/2021    Chief Complaint  Patient presents with   Hyperglycemia       Brief Narrative (HPI from H&P)   66 y.o. male with medical history significant of CAD, HTN.  He presented to the ER with about 5 to 7-day history of feeling weak with increase in urination and thirst along with some tremors.  In the ER he was diagnosed with new onset DM type II with DKA and admitted to the hospital   Subjective:    [name] today has, No headache, No chest pain, No abdominal pain - No Nausea, No new weakness tingling or numbness, no SOB.   Assessment  & Plan :    No notes have been filed under this hospital service. Service: Hospitalist    New diagnosis of DM type II with DKA on presentation - with severe dehydration - AKI, pseudohyponatremia and metabolic acidosis due to DKA.  He has been started on DKA protocol with IV fluids and insulin drip, his gap is closing nicely and bicarb is coming up, have placed him on Lantus along with IV fluids, will transition him to sliding scale insulin likely by the next metabolic panel check.  Diabetic and insulin education.  Monitor CBGs and electrolytes closely for 24 hours most likely discharge thereafter in the next 1 to 2 days once we have a stable insulin dose and education is completed.  Electrolytes are improving.   2.  AKI due to severe dehydration improving with IV fluids.  ACE inhibitor HCTZ held.  3.  Paroxysmal atrial fibrillation.  Continue beta-blocker and Eliquis combination.  Mali vas  2 score of greater than 3.  4.  Hypertension.  Continue beta-blocker and Norvasc combination and monitor.  5.  Dyslipidemia on statin and Vascepa.  6.  Gout.  On allopurinol.  7.Hypokalemia - replaced.    Encouraged the patient to sit  up in chair in the daytime use I-S and flutter valve for pulmonary toiletry.  Will advance activity and titrate down oxygen as possible.       Condition - Fair  Family Communication  :  None   Code Status :  Full  Consults  :  None  PUD Prophylaxis : PPI   Procedures  :            Disposition Plan  :    Status is: Inpatient  Remains inpatient appropriate because: DKA  DVT Prophylaxis  :     apixaban (ELIQUIS) tablet 5 mg     Lab Results  Component Value Date   PLT 187 04/16/2021    Diet :  Diet Order             Diet NPO time specified  Diet effective now                    Inpatient Medications  Scheduled Meds:  allopurinol  100 mg Oral Daily   amLODipine  10 mg Oral Daily   apixaban  5 mg Oral BID   atorvastatin  80 mg Oral QHS   escitalopram  20 mg Oral Daily   icosapent Ethyl  2 g Oral BID   insulin glargine-yfgn  30 Units Subcutaneous Daily   metoprolol succinate  25 mg Oral Daily   traZODone  100 mg Oral QHS   Continuous Infusions:  dextrose 5% lactated ringers Stopped (04/17/21 0023)   insulin 7 Units/hr (04/17/21 0753)   lactated ringers     lactated ringers 125 mL/hr at 04/17/21 0043   PRN Meds:.busPIRone, dextrose, dextrose, hydrOXYzine  Antibiotics  :    Anti-infectives (From admission, onward)    None        Time Spent in minutes  30   Lala Lund M.D on 04/17/2021 at 8:39 AM  To page go to [email or web address]   [facility] -  Office  [phone]  See all Orders from today for further details    Objective:   Vitals:   04/17/21 0700 04/17/21 0715 04/17/21 0752 04/17/21 0831  BP: 112/71 (!) 128/99 (!) 103/58 116/78  Pulse: 93 (!) 126 (!) 103 (!) 101  Resp: (!) 28  19  (!) 26  Temp:      TempSrc:      SpO2: 96% 93% 94% 93%  Weight:      Height:        Wt Readings from Last 3 Encounters:  04/16/21 81.6 kg  03/12/21 85.3 kg  09/09/20 86.8 kg     Intake/Output Summary (Last 24 hours) at 04/17/2021 0839 Last data filed at 04/17/2021 0753 Gross per 24 hour  Intake 40.57 ml  Output 1200 ml  Net -1159.43 ml     Physical Exam  Awake Alert, No new F.N deficits, Normal affect [place].AT,PERRAL Supple Neck, No JVD,   Symmetrical Chest wall movement, Good air movement bilaterally, CTAB RRR,No Gallops,Rubs or new Murmurs,  +ve B.Sounds, Abd Soft, No tenderness,   No Cyanosis, Clubbing or edema      Data Review:    CBC Recent Labs  Lab 04/16/21 2043 04/16/21 2051  WBC 13.1*  --   HGB 14.5 15.3  HCT 45.4 45.0  PLT 187  --   MCV 104.1*  --   MCH 33.3  --   MCHC 31.9  --   RDW 12.7  --   LYMPHSABS 1.1  --   MONOABS 0.9  --  EOSABS 0.0  --   BASOSABS 0.0  --     Electrolytes Recent Labs  Lab 04/16/21 2043 04/16/21 2051 04/16/21 2315 04/17/21 0211 04/17/21 0656 04/17/21 0657  NA 114* 116* 116* 127*  --  133*  K 4.5 4.4 5.2* 4.2  --  3.4*  CL 77*  --  77* 93*  --  99  CO2 10*  --  11* 18*  --  21*  GLUCOSE 1,593*  --  1,587* 1,085*  --  645*  BUN 68*  --  69* 60*  --  53*  CREATININE 3.46*  --  3.49* 2.76*  --  2.15*  CALCIUM 8.4*  --  8.3* 8.5*  --  8.9  AST 21  --   --   --   --   --   ALT 25  --   --   --   --   --   ALKPHOS 111  --   --   --   --   --   BILITOT 1.1  --   --   --   --   --   ALBUMIN 3.7  --   --   --   --   --   MG  --   --   --   --  2.4  --     ------------------------------------------------------------------------------------------------------------------ No results for input(s): CHOL, HDL, LDLCALC, TRIG, CHOLHDL, LDLDIRECT in the last 72 hours.  Lab Results  Component Value Date   HGBA1C 6.2 (H) 07/11/2019    No results for input(s): TSH, T4TOTAL, T3FREE, THYROIDAB in the last 72  hours.  Invalid input(s): FREET3 ------------------------------------------------------------------------------------------------------------------ ID Labs Recent Labs  Lab 04/16/21 2043 04/16/21 2315 04/17/21 0211 04/17/21 0657  WBC 13.1*  --   --   --   PLT 187  --   --   --   CREATININE 3.46* 3.49* 2.76* 2.15*   Cardiac Enzymes No results for input(s): CKMB, TROPONINI, MYOGLOBIN in the last 168 hours.  Invalid input(s): CK  Radiology Reports DG Chest 2 View  Result Date: 04/16/2021 CLINICAL DATA:  Tremors, weakness, short of breath EXAM: CHEST - 2 VIEW COMPARISON:  04/07/2016 FINDINGS: Frontal and lateral views of the chest demonstrate an unremarkable cardiac silhouette. No acute airspace disease, effusion, or pneumothorax. No acute bony abnormalities. IMPRESSION: 1. No acute intrathoracic process. Electronically Signed   By: Randa Ngo M.D.   On: 04/16/2021 21:30

## 2021-04-17 NOTE — ED Notes (Signed)
Pt wet due to trouble holding urinal and weakness. Pt gown and linens changed. Male external catheter placed.

## 2021-04-17 NOTE — Progress Notes (Addendum)
Inpatient Diabetes Program Recommendations  AACE/ADA: New Consensus Statement on Inpatient Glycemic Control (2015)  Target Ranges:  Prepandial:   less than 140 mg/dL      Peak postprandial:   less than 180 mg/dL (1-2 hours)      Critically ill patients:  140 - 180 mg/dL   Lab Results  Component Value Date   GLUCAP 260 (H) 04/17/2021   HGBA1C 6.2 (H) 07/11/2019    Review of Glycemic Control  Latest Reference Range & Units 04/17/21 09:39 04/17/21 10:24 04/17/21 11:31 04/17/21 12:39 04/17/21 14:05  Glucose-Capillary 70 - 99 mg/dL 384 (H) 424 (H) 337 (H) 190 (H) 260 (H)  (H): Data is abnormally high  Latest Reference Range & Units 04/16/21 20:43  Glucose 70 - 99 mg/dL 1,593 (HH)   (HH): Data is critically high  Latest Reference Range & Units 04/16/21 21:50  Beta-Hydroxybutyric Acid 0.05 - 0.27 mmol/L 6.91 (H)  (H): Data is abnormally high   Latest Reference Range & Units 04/17/21 10:47  CO2 22 - 32 mmol/L 19 (L)  (L): Data is abnormally low  Latest Reference Range & Units 04/17/21 10:47  Anion gap 5 - 15  17 (H)  (H): Data is abnormally high  Diabetes history: New DM Outpatient Diabetes medications: None Current orders for Inpatient glycemic control: IV Insulin-transitioned to SQ Semglee 30 units QD, Novolog 0-15 units TID and 0-5 units QHS, 3 units TID with meals  Spoke with pt about new diagnosis. A1C is pending.  He is very sleeping, shaky and difficult to hold concentration.  Educated him on basic pathophysiology of DM Type 2, basic home care, basic diabetes diet nutrition principles, importance of checking CBGs and maintaining good CBG control to prevent long-term and short-term complications. Reviewed signs and symptoms of hyperglycemia and hypoglycemia and how to treat hypoglycemia at home. Also reviewed blood sugar goals at home.  RNs to provide ongoing basic DM education at bedside with this patient. Have ordered educational booklet & insulin starter kit.  Have also placed  RD consult for DM diet education for this patient.   He states he drinks Pepsi, milk and water.  Discussed The Plate Method and importance of eliminating sugary beverages.  Discussed foods that contain CHO's and importance of portion control.  He is current with PCP, vascular and cardiologist.    Demonstrated insulin pen.  He will need ongoing education by staff.  Please use each patient interaction to provide diabetes education. Please review Living Well with Diabetes booklet with the patient, have patient watch patient education videos on diabetes, and instruct on insulin administration. Please allow patient to be actively engaged with diabetes management by allowing patient to check own glucose and self-administer insulin injections. Diabetes Coordinator will follow up with patient and reinforce diabetes education.  Attempted to call wife, Hassan Rowan at [phone] and it went straight to VM.  Will continue to follow while inpatient.  Thank you, Reche Dixon, MSN, RN Diabetes Coordinator Inpatient Diabetes Program [phone] (team pager from 8a-5p)

## 2021-04-17 NOTE — ED Notes (Signed)
Glucose 1085

## 2021-04-17 NOTE — ED Notes (Signed)
New BMP drawn and sent to lab

## 2021-04-17 NOTE — Evaluation (Signed)
Physical Therapy Evaluation Patient Details Name: [name] MRN: [id number] DOB: 1955-06-10 Today's Date: 04/17/2021  History of Present Illness  Pt is a 66 y/o male admitted 1/19 with 5-7 day hx of weakness, increased urination and tremors.  Found with new onset DM type II with DKA and severe dehydration. PMH inculdes: anxiety, AKI, CAD, dementia, DKA, CVA, TIA.  Clinical Impression  Pt admitted secondary to problem above with deficits below. Pt requiring mod A for bed mobility and initially min A +2 for transfers. However, pt with episode of BLE buckling and required mod A +2 to maintain balance. Based on current presentation, recommending SNF level therapies. However, if symptoms improve and balance improves, pt will likely be able to d/c with HHPT and family support. Will continue to follow acutely and update recommendations accordingly.        Recommendations for follow up therapy are one component of a multi-disciplinary discharge planning process, led by the attending physician.  Recommendations may be updated based on patient status, additional functional criteria and insurance authorization.  Follow Up Recommendations Skilled nursing-short term rehab (<3 hours/day) (may progress to HHPT if symptoms improve)    Assistance Recommended at Discharge Frequent or constant Supervision/Assistance  Patient can return home with the following  A lot of help with walking and/or transfers;A lot of help with bathing/dressing/bathroom    Equipment Recommendations None recommended by PT  Recommendations for Other Services       Functional Status Assessment Patient has had a recent decline in their functional status and demonstrates the ability to make significant improvements in function in a reasonable and predictable amount of time.     Precautions / Restrictions Precautions Precautions: Fall Restrictions Weight Bearing Restrictions: No      Mobility  Bed Mobility Overal bed  mobility: Needs Assistance Bed Mobility: Supine to Sit, Sit to Supine     Supine to sit: Mod assist Sit to supine: Min guard   General bed mobility comments: mod assist for trunk to ascend to sitting, min guard to return supine    Transfers Overall transfer level: Needs assistance Equipment used: 2 person hand held assist Transfers: Sit to/from Stand Sit to Stand: Min assist, +2 physical assistance, +2 safety/equipment, Mod assist           General transfer comment: Pt initially requiring min A +2 for steadying to stand, however, pt with episode of buckling and required mod A +2 to maintain balance    Ambulation/Gait                  Stairs            Wheelchair Mobility    Modified Rankin (Stroke Patients Only)       Balance Overall balance assessment: Needs assistance Sitting-balance support: No upper extremity supported, Feet supported Sitting balance-Leahy Scale: Poor Sitting balance - Comments: dynamically limited during ADLs with up to mod assist (anterior and posterior) due to tremors   Standing balance support: Bilateral upper extremity supported, During functional activity Standing balance-Leahy Scale: Poor Standing balance comment: relies on external support                             Pertinent Vitals/Pain Pain Assessment Pain Assessment: No/denies pain    Home Living Family/patient expects to be discharged to:: Private residence Living Arrangements: Children;Other relatives (son and daughter) Available Help at Discharge: Family;Available 24 hours/day Type of Home: Mobile home Home  Access: Stairs to enter Entrance Stairs-Rails: Right;Left;Can reach both Entrance Stairs-Number of Steps: 4   Home Layout: One level Home Equipment: Conservation officer, nature (2 wheels);Cane - single point      Prior Function Prior Level of Function : Independent/Modified Independent               ADLs Comments: Reports he does not drive.      Hand Dominance   Dominant Hand: Right    Extremity/Trunk Assessment   Upper Extremity Assessment Upper Extremity Assessment: Defer to OT evaluation    Lower Extremity Assessment Lower Extremity Assessment: Generalized weakness (BLE jerking type movements)    Cervical / Trunk Assessment Cervical / Trunk Assessment: Normal  Communication   Communication: No difficulties  Cognition Arousal/Alertness: Awake/alert Behavior During Therapy: Flat affect Overall Cognitive Status: Impaired/Different from baseline Area of Impairment: Orientation, Attention, Memory, Following commands, Safety/judgement, Awareness, Problem solving                 Orientation Level: Disoriented to, Time, Situation Current Attention Level: Sustained Memory: Decreased recall of precautions, Decreased short-term memory Following Commands: Follows one step commands consistently, Follows one step commands with increased time, Follows multi-step commands inconsistently Safety/Judgement: Decreased awareness of safety, Decreased awareness of deficits Awareness: Emergent Problem Solving: Slow processing, Requires verbal cues, Difficulty sequencing General Comments: Pt with difficulty reporting his birthday and why he is here.  Reoriented to month and day of the week, unable to recall initally but able to after 2nd time reorienting pt.  Follows simple commands and presents with poor awareness of safety and deficits.        General Comments General comments (skin integrity, edema, etc.): BP stable, orthostatics assessed.    Exercises     Assessment/Plan    PT Assessment Patient needs continued PT services  PT Problem List Decreased strength;Decreased activity tolerance;Decreased balance;Decreased mobility;Decreased knowledge of use of DME;Decreased knowledge of precautions;Decreased cognition;Decreased safety awareness       PT Treatment Interventions DME instruction;Gait training;Functional  mobility training;Stair training;Therapeutic activities;Therapeutic exercise;Balance training;Patient/family education    PT Goals (Current goals can be found in the Care Plan section)  Acute Rehab PT Goals Patient Stated Goal: to feel better PT Goal Formulation: With patient Time For Goal Achievement: 05/01/21 Potential to Achieve Goals: Good    Frequency Min 3X/week     Co-evaluation PT/OT/SLP Co-Evaluation/Treatment: Yes Reason for Co-Treatment: To address functional/ADL transfers;For patient/therapist safety PT goals addressed during session: Mobility/safety with mobility;Balance OT goals addressed during session: ADL's and self-care       AM-PAC PT "6 Clicks" Mobility  Outcome Measure Help needed turning from your back to your side while in a flat bed without using bedrails?: A Little Help needed moving from lying on your back to sitting on the side of a flat bed without using bedrails?: A Lot Help needed moving to and from a bed to a chair (including a wheelchair)?: Total Help needed standing up from a chair using your arms (e.g., wheelchair or bedside chair)?: Total Help needed to walk in hospital room?: Total Help needed climbing 3-5 steps with a railing? : Total 6 Click Score: 9    End of Session Equipment Utilized During Treatment: Gait belt Activity Tolerance: Patient tolerated treatment well Patient left: in bed;with call bell/phone within reach (on stretcher in ED) Nurse Communication: Mobility status PT Visit Diagnosis: Unsteadiness on feet (R26.81);Muscle weakness (generalized) (M62.81);Difficulty in walking, not elsewhere classified (R26.2)    Time: 8756-4332 PT Time Calculation (min) (ACUTE  ONLY): 19 min   Charges:   PT Evaluation $PT Eval Moderate Complexity: 1 Mod          Reuel Derby, PT, DPT  Acute Rehabilitation Services  Pager: [phone] Office: [phone]   Rudean Hitt 04/17/2021, 1:33 PM

## 2021-04-17 NOTE — Evaluation (Signed)
Occupational Therapy Evaluation Patient Details Name: [name] MRN: [id number] DOB: [birth year] Today's Date: 04/17/2021   History of Present Illness Pt is a 66 y/o male admitted 1/19 with 5-7 day hx of weakness, increased urination and tremors.  Found with new onset DM type II with DKA and severe dehydration. PMH inculdes: anxiety, AKI, CAD, dementia, DKA, CVA, TIA.   Clinical Impression   PTA patient reports independent.  Admitted for above and limited by problem list below, including generalized weakness, decreased activity tolerance, generalized tremors, and impaired balance. He currently requires mod assist for bed mobility, min assist +2 for sit to stand (but mod assist +2 when B knees buckle), and up to total assist for ADLs.  Cognitively he has difficulty reporting his birthday and is disoriented to time/situation, able to recall after several attempts to reorient; demonstrates decreased awareness, problem solving. He reports living with his children, who can provide 24/7 support.   Based on performance today, believe he will best benefit from continued OT services while admitted and after dc at SNF level rehab to optimize independence and safety with ADLs and mobility.  Hopeful pt will progress to [facility] level, will follow acutely.      Recommendations for follow up therapy are one component of a multi-disciplinary discharge planning process, led by the attending physician.  Recommendations may be updated based on patient status, additional functional criteria and insurance authorization.   Follow Up Recommendations  Skilled nursing-short term rehab (<3 hours/day) (may progress to [facility])    Assistance Recommended at Discharge Frequent or constant Supervision/Assistance  Patient can return home with the following A lot of help with walking and/or transfers;A lot of help with bathing/dressing/bathroom;Assistance with cooking/housework;Direct supervision/assist for medications management;Direct  supervision/assist for financial management;Assist for transportation;Help with stairs or ramp for entrance    Functional Status Assessment  Patient has had a recent decline in their functional status and demonstrates the ability to make significant improvements in function in a reasonable and predictable amount of time.  Equipment Recommendations  BSC/3in1;Other (comment) (RW)    Recommendations for Other Services       Precautions / Restrictions Precautions Precautions: Fall Restrictions Weight Bearing Restrictions: No      Mobility Bed Mobility Overal bed mobility: Needs Assistance Bed Mobility: Supine to Sit, Sit to Supine     Supine to sit: Mod assist Sit to supine: Min guard   General bed mobility comments: mod assist for trunk to ascend, min guard to return supine    Transfers Overall transfer level: Needs assistance Equipment used: 2 person hand held assist Transfers: Sit to/from Stand Sit to Stand: Min assist, +2 physical assistance, +2 safety/equipment                  Balance Overall balance assessment: Needs assistance Sitting-balance support: No upper extremity supported, Feet supported Sitting balance-Leahy Scale: Poor Sitting balance - Comments: dynamically limited during ADLs with up to mod assist (anterior and posterior) due to tremors   Standing balance support: Bilateral upper extremity supported, During functional activity Standing balance-Leahy Scale: Poor Standing balance comment: relies on external support                           ADL either performed or assessed with clinical judgement   ADL Overall ADL's : Needs assistance/impaired     Grooming: Moderate assistance;Sitting           Upper Body Dressing : Maximal assistance;Sitting  Lower Body Dressing: Total assistance;Sit to/from stand Lower Body Dressing Details (indicate cue type and reason): assist with socks, relies on BUE support in standing   Toilet  Transfer Details (indicate cue type and reason): deferred, sit to stand at EOB with min assist +2         Functional mobility during ADLs: Minimal assistance;Moderate assistance;+2 for physical assistance;+2 for safety/equipment General ADL Comments: sit to stand from EOB, static standing with spontaneous buckling of B knees requriing mod assist+2 to recover     Vision Baseline Vision/History: 0 No visual deficits Ability to See in Adequate Light: 0 Adequate Patient Visual Report: No change from baseline Vision Assessment?: No apparent visual deficits     Perception     Praxis      Pertinent Vitals/Pain Pain Assessment Pain Assessment: No/denies pain     Hand Dominance Right   Extremity/Trunk Assessment Upper Extremity Assessment Upper Extremity Assessment: Generalized weakness (B UE tremors with decreased GM/FMC)   Lower Extremity Assessment Lower Extremity Assessment: Defer to PT evaluation       Communication Communication Communication: No difficulties   Cognition Arousal/Alertness: Awake/alert Behavior During Therapy: Flat affect Overall Cognitive Status: Impaired/Different from baseline Area of Impairment: Orientation, Attention, Memory, Following commands, Safety/judgement, Awareness, Problem solving                 Orientation Level: Disoriented to, Time, Situation Current Attention Level: Sustained Memory: Decreased recall of precautions, Decreased short-term memory Following Commands: Follows one step commands consistently, Follows one step commands with increased time, Follows multi-step commands inconsistently Safety/Judgement: Decreased awareness of safety, Decreased awareness of deficits Awareness: Emergent Problem Solving: Slow processing, Requires verbal cues, Difficulty sequencing General Comments: Pt with difficulty reporting his birthday and why he is here.  Reoriented to month and day of the week, unable to recall initally but able to after  2nd time reorienting pt.  Follows simple commands and presents with poor awareness of safety and deficits.     General Comments  BP stable, orthostatics assessed.    Exercises     Shoulder Instructions      Home Living Family/patient expects to be discharged to:: Private residence Living Arrangements: Children;Other relatives (son and daughter) Available Help at Discharge: Family;Available 24 hours/day Type of Home: Mobile home Home Access: Stairs to enter Entrance Stairs-Number of Steps: 4 Entrance Stairs-Rails: Right;Left;Can reach both Home Layout: One level     Bathroom Shower/Tub: Teacher, early years/pre: Standard     Home Equipment: Conservation officer, nature (2 wheels);Cane - single point          Prior Functioning/Environment Prior Level of Function : Independent/Modified Independent               ADLs Comments: Reports he does not drive.        OT Problem List: Decreased strength;Decreased activity tolerance;Impaired balance (sitting and/or standing);Decreased coordination;Decreased cognition;Decreased safety awareness;Decreased knowledge of use of DME or AE;Decreased knowledge of precautions;Impaired UE functional use      OT Treatment/Interventions: Self-care/ADL training;Therapeutic exercise;DME and/or AE instruction;Therapeutic activities;Cognitive remediation/compensation;Patient/family education;Balance training;Neuromuscular education;Energy conservation    OT Goals(Current goals can be found in the care plan section) Acute Rehab OT Goals Patient Stated Goal: get better OT Goal Formulation: With patient Time For Goal Achievement: 05/01/21 Potential to Achieve Goals: Good  OT Frequency: Min 2X/week    Co-evaluation PT/OT/SLP Co-Evaluation/Treatment: Yes Reason for Co-Treatment: To address functional/ADL transfers;For patient/therapist safety PT goals addressed during session: Balance;Mobility/safety with mobility OT goals  addressed during  session: ADL's and self-care      AM-PAC OT "6 Clicks" Daily Activity     Outcome Measure Help from another person eating meals?: A Lot Help from another person taking care of personal grooming?: A Lot Help from another person toileting, which includes using toliet, bedpan, or urinal?: Total Help from another person bathing (including washing, rinsing, drying)?: A Lot Help from another person to put on and taking off regular upper body clothing?: A Lot Help from another person to put on and taking off regular lower body clothing?: Total 6 Click Score: 10   End of Session Equipment Utilized During Treatment: Gait belt Nurse Communication: Mobility status  Activity Tolerance: Patient tolerated treatment well Patient left: in bed;with call bell/phone within reach  OT Visit Diagnosis: Other abnormalities of gait and mobility (R26.89);Muscle weakness (generalized) (M62.81);Other symptoms and signs involving the nervous system (R29.898)                Time: 9373-4287 OT Time Calculation (min): 16 min Charges:  OT General Charges $OT Visit: 1 Visit OT Evaluation $OT Eval Moderate Complexity: 1 Mod  Jolaine Artist, OT Acute Rehabilitation Services Pager [phone] Office [phone]   Delight Stare 04/17/2021, 12:44 PM

## 2021-04-18 ENCOUNTER — Inpatient Hospital Stay (HOSPITAL_COMMUNITY): Payer: Medicare Other

## 2021-04-18 DIAGNOSIS — E1110 Type 2 diabetes mellitus with ketoacidosis without coma: Secondary | ICD-10-CM | POA: Diagnosis not present

## 2021-04-18 LAB — CBC WITH DIFFERENTIAL/PLATELET
Abs Immature Granulocytes: 0.02 10*3/uL (ref 0.00–0.07)
Basophils Absolute: 0 10*3/uL (ref 0.0–0.1)
Basophils Relative: 0 %
Eosinophils Absolute: 0 10*3/uL (ref 0.0–0.5)
Eosinophils Relative: 0 %
HCT: 40.4 % (ref 39.0–52.0)
Hemoglobin: 15 g/dL (ref 13.0–17.0)
Immature Granulocytes: 0 %
Lymphocytes Relative: 11 %
Lymphs Abs: 1 10*3/uL (ref 0.7–4.0)
MCH: 32.8 pg (ref 26.0–34.0)
MCHC: 37.1 g/dL — ABNORMAL HIGH (ref 30.0–36.0)
MCV: 88.4 fL (ref 80.0–100.0)
Monocytes Absolute: 0.9 10*3/uL (ref 0.1–1.0)
Monocytes Relative: 9 %
Neutro Abs: 7.4 10*3/uL (ref 1.7–7.7)
Neutrophils Relative %: 80 %
Platelets: 132 10*3/uL — ABNORMAL LOW (ref 150–400)
RBC: 4.57 MIL/uL (ref 4.22–5.81)
RDW: 12.7 % (ref 11.5–15.5)
WBC: 9.2 10*3/uL (ref 4.0–10.5)
nRBC: 0 % (ref 0.0–0.2)

## 2021-04-18 LAB — COMPREHENSIVE METABOLIC PANEL
ALT: 21 U/L (ref 0–44)
AST: 22 U/L (ref 15–41)
Albumin: 2.9 g/dL — ABNORMAL LOW (ref 3.5–5.0)
Alkaline Phosphatase: 71 U/L (ref 38–126)
Anion gap: 9 (ref 5–15)
BUN: 30 mg/dL — ABNORMAL HIGH (ref 8–23)
CO2: 24 mmol/L (ref 22–32)
Calcium: 8.6 mg/dL — ABNORMAL LOW (ref 8.9–10.3)
Chloride: 104 mmol/L (ref 98–111)
Creatinine, Ser: 1.49 mg/dL — ABNORMAL HIGH (ref 0.61–1.24)
GFR, Estimated: 51 mL/min — ABNORMAL LOW (ref >60)
Glucose, Bld: 252 mg/dL — ABNORMAL HIGH (ref 70–99)
Potassium: 3.3 mmol/L — ABNORMAL LOW (ref 3.5–5.1)
Sodium: 137 mmol/L (ref 135–145)
Total Bilirubin: 0.6 mg/dL (ref 0.3–1.2)
Total Protein: 5.7 g/dL — ABNORMAL LOW (ref 6.5–8.1)

## 2021-04-18 LAB — TSH: TSH: 0.636 u[IU]/mL (ref 0.350–4.500)

## 2021-04-18 LAB — BRAIN NATRIURETIC PEPTIDE: B Natriuretic Peptide: 315.9 pg/mL — ABNORMAL HIGH (ref 0.0–100.0)

## 2021-04-18 LAB — GLUCOSE, CAPILLARY
Glucose-Capillary: 294 mg/dL — ABNORMAL HIGH (ref 70–99)
Glucose-Capillary: 259 mg/dL — ABNORMAL HIGH (ref 70–99)
Glucose-Capillary: 155 mg/dL — ABNORMAL HIGH (ref 70–99)
Glucose-Capillary: 142 mg/dL — ABNORMAL HIGH (ref 70–99)

## 2021-04-18 LAB — PROCALCITONIN: Procalcitonin: 1.44 ng/mL

## 2021-04-18 LAB — MAGNESIUM: Magnesium: 1.6 mg/dL — ABNORMAL LOW (ref 1.7–2.4)

## 2021-04-18 MED ORDER — HYDRALAZINE HCL 20 MG/ML IJ SOLN
10.0000 mg | Freq: Four times a day (QID) | INTRAMUSCULAR | Status: DC | PRN
  Administered 2021-04-25: 10 mg via INTRAVENOUS
  Filled 2021-04-18: qty 1

## 2021-04-18 MED ORDER — INSULIN ASPART 100 UNIT/ML IJ SOLN
5.0000 [IU] | Freq: Three times a day (TID) | INTRAMUSCULAR | Status: DC
  Administered 2021-04-19: 5 [IU] via SUBCUTANEOUS

## 2021-04-18 MED ORDER — MAGNESIUM SULFATE IN D5W 1-5 GM/100ML-% IV SOLN
1.0000 g | Freq: Once | INTRAVENOUS | Status: AC
  Administered 2021-04-18: 1 g via INTRAVENOUS
  Filled 2021-04-18 (×2): qty 100

## 2021-04-18 MED ORDER — MAGNESIUM SULFATE 2 GM/50ML IV SOLN
2.0000 g | Freq: Once | INTRAVENOUS | Status: AC
  Administered 2021-04-18: 2 g via INTRAVENOUS
  Filled 2021-04-18: qty 50

## 2021-04-18 MED ORDER — POTASSIUM CHLORIDE CRYS ER 20 MEQ PO TBCR
40.0000 meq | EXTENDED_RELEASE_TABLET | Freq: Once | ORAL | Status: AC
  Administered 2021-04-18: 40 meq via ORAL
  Filled 2021-04-18: qty 2

## 2021-04-18 MED ORDER — INSULIN GLARGINE-YFGN 100 UNIT/ML ~~LOC~~ SOLN
45.0000 [IU] | Freq: Every day | SUBCUTANEOUS | Status: DC
  Administered 2021-04-18 – 2021-04-19 (×2): 45 [IU] via SUBCUTANEOUS
  Filled 2021-04-18 (×3): qty 0.45

## 2021-04-18 NOTE — Progress Notes (Signed)
Brief Nutrition Note:   RD consulted for nutrition education regarding diabetes, new dx  Lab Results  Component Value Date   HGBA1C 6.2 (H) 07/11/2019   RD attempted to reach pt via phone but unsuccessful.   RD placed  "Carbohydrate Counting for People with Diabetes" handout from the Academy of Nutrition and Dietetics in AV[facility].   Given new diagnosis, recommend outpatient referral for DM education. Consult placed.   Body mass index is 24.41 kg/m. .  Current diet order is Carb Modified, good appetite. Labs and medications reviewed.   Attempted to provide verba education on follow-up  Kerman Passey MS, RDN, LDN, CNSC Registered Dietitian III Clinical Nutrition RD Pager and On-Call Pager Number Located in [place]

## 2021-04-18 NOTE — Progress Notes (Signed)
Inpatient Diabetes Program Recommendations  AACE/ADA: New Consensus Statement on Inpatient Glycemic Control (2015)  Target Ranges:  Prepandial:   less than 140 mg/dL      Peak postprandial:   less than 180 mg/dL (1-2 hours)      Critically ill patients:  140 - 180 mg/dL   Lab Results  Component Value Date   GLUCAP 294 (H) 04/18/2021   HGBA1C 6.2 (H) 07/11/2019    Review of Glycemic Conrol  Current orders for Inpatient glycemic control: Semglee 45 units QD, Novolog 0-15 units TID with meals and 0-5 HS + 5 units TID  Updated HgbA1C pending.  Inpatient Diabetes Program Recommendations:    Spoke with pt on phone regarding insulin pen administration. RN will educated on how to use insulin pen at home. Has insulin pen starter kit. Discussed new diagnosis of DM. Discussed hypoglycemia s/s and treatment. Discussed impact of nutrition, exercise, stress, sickness, and medications on diabetes control. Asked pt to review LWWD book. To f/u with PCP.  Thank you. Lorenda Peck, RD, LDN, CDE Inpatient Diabetes Coordinator [phone]

## 2021-04-18 NOTE — Discharge Instructions (Signed)
Carbohydrate Counting For People With Diabetes  Foods with carbohydrates make your blood glucose level go up. Learning how to count carbohydrates can help you control your blood glucose levels. First, identify the foods you eat that contain carbohydrates. Then, using the Foods with Carbohydrates chart, determine about how much carbohydrates are in your meals and snacks. Make sure you are eating foods with fiber, protein, and healthy fat along with your carbohydrate foods. Foods with Carbohydrates The following table shows carbohydrate foods that have about 15 grams of carbohydrate each. Using measuring cups, spoons, or a food scale when you first begin learning about carbohydrate counting can help you learn about the portion sizes you typically eat. The following foods have 15 grams carbohydrate each:  Grains 1 slice bread (1 ounce)  1 small tortilla (6-inch size)   large bagel (1 ounce)  1/3 cup pasta or rice (cooked)   hamburger or hot dog bun ( ounce)   cup cooked cereal   to  cup ready-to-eat cereal  2 taco shells (5-inch size) Fruit 1 small fresh fruit ( to 1 cup)   medium banana  17 small grapes (3 ounces)  1 cup melon or berries   cup canned or frozen fruit  2 tablespoons dried fruit (blueberries, cherries, cranberries, raisins)   cup unsweetened fruit juice  Starchy Vegetables  cup cooked beans, peas, corn, potatoes/sweet potatoes   large baked potato (3 ounces)  1 cup acorn or butternut squash  Snack Foods 3 to 6 crackers  8 potato chips or 13 tortilla chips ( ounce to 1 ounce)  3 cups popped popcorn  Dairy 3/4 cup (6 ounces) nonfat plain yogurt, or yogurt with sugar-free sweetener  1 cup milk  1 cup plain rice, soy, coconut or flavored almond milk Sweets and Desserts  cup ice cream or frozen yogurt  1 tablespoon jam, jelly, pancake syrup, table sugar, or honey  2 tablespoons light pancake syrup  1 inch square of frosted cake or 2 inch square of unfrosted  cake  2 small cookies (2/3 ounce each) or  large cookie  Sometimes you'll have to estimate carbohydrate amounts if you don't know the exact recipe. One cup of mixed foods like soups can have 1 to 2 carbohydrate servings, while some casseroles might have 2 or more servings of carbohydrate. Foods that have less than 20 calories in each serving can be counted as "free" foods. Count 1 cup raw vegetables, or  cup cooked non-starchy vegetables as "free" foods. If you eat 3 or more servings at one meal, then count them as 1 carbohydrate serving.  Foods without Carbohydrates  Not all foods contain carbohydrates. Meat, some dairy, fats, non-starchy vegetables, and many beverages don't contain carbohydrate. So when you count carbohydrates, you can generally exclude chicken, pork, beef, fish, seafood, eggs, tofu, cheese, butter, sour cream, avocado, nuts, seeds, olives, mayonnaise, water, Mory coffee, unsweetened tea, and zero-calorie drinks. Vegetables with no or low carbohydrate include green beans, cauliflower, tomatoes, and onions. How much carbohydrate should I eat at each meal?  Carbohydrate counting can help you plan your meals and manage your weight. Following are some starting points for carbohydrate intake at each meal. Work with your registered dietitian nutritionist to find the best range that works for your blood glucose and weight.   To Lose Weight To Maintain Weight  Women 2 - 3 carb servings 3 - 4 carb servings  Men 3 - 4 carb servings 4 - 5 carb servings  Checking your   blood glucose after meals will help you know if you need to adjust the timing, type, or number of carbohydrate servings in your meal plan. Achieve and keep a healthy body weight by balancing your food intake and physical activity.  Tips How should I plan my meals?  Plan for half the food on your plate to include non-starchy vegetables, like salad greens, broccoli, or carrots. Try to eat 3 to 5 servings of non-starchy vegetables  every day. Have a protein food at each meal. Protein foods include chicken, fish, meat, eggs, or beans (note that beans contain carbohydrate). These two food groups (non-starchy vegetables and proteins) are low in carbohydrate. If you fill up your plate with these foods, you will eat less carbohydrate but still fill up your stomach. Try to limit your carbohydrate portion to  of the plate.  What fats are healthiest to eat?  Diabetes increases risk for heart disease. To help protect your heart, eat more healthy fats, such as olive oil, nuts, and avocado. Eat less saturated fats like butter, cream, and high-fat meats, like bacon and sausage. Avoid trans fats, which are in all foods that list "partially hydrogenated oil" as an ingredient. What should I drink?  Choose drinks that are not sweetened with sugar. The healthiest choices are water, carbonated or seltzer waters, and tea and coffee without added sugars.  Sweet drinks will make your blood glucose go up very quickly. One serving of soda or energy drink is  cup. It is best to drink these beverages only if your blood glucose is low.  Artificially sweetened, or diet drinks, typically do not increase your blood glucose if they have zero calories in them. Read labels of beverages, as some diet drinks do have carbohydrate and will raise your blood glucose. Label Reading Tips Read Nutrition Facts labels to find out how many grams of carbohydrate are in a food you want to eat. Don't forget: sometimes serving sizes on the label aren't the same as how much food you are going to eat, so you may need to calculate how much carbohydrate is in the food you are serving yourself.   Carbohydrate Counting for People with Diabetes Sample 1-Day Menu  Breakfast  cup yogurt, low fat, low sugar (1 carbohydrate serving)   cup cereal, ready-to-eat, unsweetened (1 carbohydrate serving)  1 cup strawberries (1 carbohydrate serving)   cup almonds ( carbohydrate serving)   Lunch 1, 5 ounce can chunk light tuna  2 ounces cheese, low fat cheddar  6 whole wheat crackers (1 carbohydrate serving)  1 small apple (1 carbohydrate servings)   cup carrots ( carbohydrate serving)   cup snap peas  1 cup 1% milk (1 carbohydrate serving)   Evening Meal Stir fry made with: 3 ounces chicken  1 cup brown rice (3 carbohydrate servings)   cup broccoli ( carbohydrate serving)   cup green beans   cup onions  1 tablespoon olive oil  2 tablespoons teriyaki sauce ( carbohydrate serving)  Evening Snack 1 extra small banana (1 carbohydrate serving)  1 tablespoon peanut butter   Carbohydrate Counting for People with Diabetes Vegan Sample 1-Day Menu  Breakfast 1 cup cooked oatmeal (2 carbohydrate servings)   cup blueberries (1 carbohydrate serving)  2 tablespoons flaxseeds  1 cup soymilk fortified with calcium and vitamin D  1 cup coffee  Lunch 2 slices whole wheat bread (2 carbohydrate servings)   cup baked tofu   cup lettuce  2 slices tomato  2 slices avocado     cup baby carrots ( carbohydrate serving)  1 orange (1 carbohydrate serving)  1 cup soymilk fortified with calcium and vitamin D   Evening Meal Burrito made with: 1 6-inch corn tortilla (1 carbohydrate serving)  1 cup refried vegetarian beans (2 carbohydrate servings)   cup chopped tomatoes   cup lettuce   cup salsa  1/3 cup brown rice (1 carbohydrate serving)  1 tablespoon olive oil for rice   cup zucchini   Evening Snack 6 small whole grain crackers (1 carbohydrate serving)  2 apricots ( carbohydrate serving)   cup unsalted peanuts ( carbohydrate serving)    Carbohydrate Counting for People with Diabetes Vegetarian (Lacto-Ovo) Sample 1-Day Menu  Breakfast 1 cup cooked oatmeal (2 carbohydrate servings)   cup blueberries (1 carbohydrate serving)  2 tablespoons flaxseeds  1 egg  1 cup 1% milk (1 carbohydrate serving)  1 cup coffee  Lunch 2 slices whole wheat bread (2 carbohydrate  servings)  2 ounces low-fat cheese   cup lettuce  2 slices tomato  2 slices avocado   cup baby carrots ( carbohydrate serving)  1 orange (1 carbohydrate serving)  1 cup unsweetened tea  Evening Meal Burrito made with: 1 6-inch corn tortilla (1 carbohydrate serving)   cup refried vegetarian beans (1 carbohydrate serving)   cup tomatoes   cup lettuce   cup salsa  1/3 cup brown rice (1 carbohydrate serving)  1 tablespoon olive oil for rice   cup zucchini  1 cup 1% milk (1 carbohydrate serving)  Evening Snack 6 small whole grain crackers (1 carbohydrate serving)  2 apricots ( carbohydrate serving)   cup unsalted peanuts ( carbohydrate serving)    Copyright 2020  [employer]. All rights reserved.  Using Nutrition Labels: Carbohydrate  Serving Size  Look at the serving size. All the information on the label is based on this portion. Servings Per Container  The number of servings contained in the package. Guidelines for Carbohydrate  Look at the total grams of carbohydrate in the serving size.  1 carbohydrate choice = 15 grams of carbohydrate. Range of Carbohydrate Grams Per Choice  Carbohydrate Grams/Choice Carbohydrate Choices  6-10   11-20 1  21-25 1  26-35 2  36-40 2  41-50 3  51-55 3  56-65 4  66-70 4  71-80 5    Copyright 2020  Academy [employer]. All rights reserved.  

## 2021-04-18 NOTE — Progress Notes (Signed)
PROGRESS NOTE                                                                                                                                                                                                             Patient Demographics:    [name], is a 66 y.o. male, DOB - [birth year], XTK:[id number]  Outpatient Primary MD for the patient is [name], MD    LOS - 2  Admit date - 04/16/2021    Chief Complaint  Patient presents with   Hyperglycemia       Brief Narrative (HPI from H&P)   66 y.o. male with medical history significant of CAD, HTN.  He presented to the ER with about 5 to 7-day history of feeling weak with increase in urination and thirst along with some tremors.  In the ER he was diagnosed with new onset DM type II with DKA and admitted to the hospital   Subjective:   Patient in bed, appears comfortable, denies any headache, no fever, no chest pain or pressure, no shortness of breath , no abdominal pain. No new focal weakness.   Assessment  & Plan :    No notes have been filed under this hospital service. Service: Hospitalist    New diagnosis of DM type II with DKA on presentation - with severe dehydration - AKI, pseudohyponatremia and metabolic acidosis due to DKA.  He was treated with DKA protocol along with aggressive IV fluids and electrolyte replacement, gap is closed he has been transitioned to Lantus and sliding scale dose adjusted for better control, diabetic and insulin education, continue to monitor if CBGs are stable likely discharge in the next 1 to 2 days.  He still appears quite drained and dehydrated.   2.  AKI due to severe dehydration - holding ARB and HCTZ, has been aggressively hydrated with IV fluids since he has developed a few Rales holding further IV fluids but have encouraged oral hydration and to sit up in chair in the daytime.  Advance activity.  3.  Paroxysmal atrial  fibrillation.  Continue beta-blocker and Eliquis combination.  Mali vas 2 score of greater than 3.  4.  Hypertension.  Blood pressure is better on beta-blocker monitor add as needed hydralazine..  5.  Dyslipidemia on statin and Vascepa.  6.  Gout.  On allopurinol.  7. Hypokalemia - replaced.  8.  Hypomagnesemia.  Aggressively replaced.  Encouraged the patient to sit up in chair in the daytime use I-S and flutter valve for pulmonary toiletry.  Will advance activity and titrate down oxygen as possible.  Lab Results  Component Value Date   HGBA1C 6.2 (H) 07/11/2019   CBG (last 3)  Recent Labs    04/17/21 1645 04/17/21 2131 04/18/21 0812  GLUCAP 295* 273* 294*        Condition - Fair  Family Communication  :  None   Code Status :  Full  Consults  :  None  PUD Prophylaxis : PPI   Procedures  :            Disposition Plan  :    Status is: Inpatient  Remains inpatient appropriate because: DKA  DVT Prophylaxis  :    Place TED hose Start: 04/18/21 1101 apixaban (ELIQUIS) tablet 5 mg     Lab Results  Component Value Date   PLT 132 (L) 04/18/2021    Diet :  Diet Order             Diet Carb Modified Fluid consistency: Thin; Room service appropriate? Yes  Diet effective now                    Inpatient Medications  Scheduled Meds:  allopurinol  100 mg Oral Daily   apixaban  5 mg Oral BID   atorvastatin  80 mg Oral QHS   escitalopram  20 mg Oral Daily   icosapent Ethyl  2 g Oral BID   insulin aspart  0-15 Units Subcutaneous TID WC   insulin aspart  0-5 Units Subcutaneous QHS   insulin aspart  5 Units Subcutaneous TID WC   insulin glargine-yfgn  45 Units Subcutaneous Daily   insulin starter kit- pen needles  1 kit Other Once   living well with diabetes book   Does not apply Once   metoprolol succinate  25 mg Oral Daily   pantoprazole  40 mg Oral Daily   traZODone  100 mg Oral QHS   Continuous Infusions:  magnesium sulfate bolus IVPB      PRN Meds:.busPIRone, dextrose, hydrOXYzine  Antibiotics  :    Anti-infectives (From admission, onward)    None        Time Spent in minutes  30   Lala Lund M.D on 04/18/2021 at 11:00 AM  To page go to [email or web address]   [facility] -  Office  [phone]  See all Orders from today for further details    Objective:   Vitals:   04/17/21 2114 04/17/21 2319 04/18/21 0400 04/18/21 0806  BP: (!) 152/93 111/72 (!) 146/83 120/65  Pulse: (!) 118 99 100 95  Resp:  17 (!) 23 20  Temp: 97.7 F (36.5 C) 97.9 F (36.6 C) 98.7 F (37.1 C) 98.6 F (37 C)  TempSrc: Oral Oral Axillary Oral  SpO2: 94% 96% 94% 93%  Weight:      Height:        Wt Readings from Last 3 Encounters:  04/16/21 81.6 kg  03/12/21 85.3 kg  09/09/20 86.8 kg     Intake/Output Summary (Last 24 hours) at 04/18/2021 1100 Last data filed at 04/18/2021 0519 Gross per 24 hour  Intake 1556.78 ml  Output 650 ml  Net 906.78 ml     Physical Exam  Awake Alert, No new F.N deficits, Normal affect [place].AT,PERRAL Supple Neck, No JVD,  Symmetrical Chest wall movement, Good air movement bilaterally, few rales RRR,No Gallops, Rubs or new Murmurs,  +ve B.Sounds, Abd Soft, No tenderness,   No Cyanosis, Clubbing or edema      Data Review:    CBC Recent Labs  Lab 04/16/21 2043 04/16/21 2051 04/18/21 0052  WBC 13.1*  --  9.2  HGB 14.5 15.3 15.0  HCT 45.4 45.0 40.4  PLT 187  --  132*  MCV 104.1*  --  88.4  MCH 33.3  --  32.8  MCHC 31.9  --  37.1*  RDW 12.7  --  12.7  LYMPHSABS 1.1  --  1.0  MONOABS 0.9  --  0.9  EOSABS 0.0  --  0.0  BASOSABS 0.0  --  0.0    Electrolytes Recent Labs  Lab 04/16/21 2043 04/16/21 2051 04/17/21 0211 04/17/21 0656 04/17/21 0657 04/17/21 1047 04/17/21 1342 04/18/21 0052 04/18/21 0729  NA 114*   < > 127*  --  133* 135 138 137  --   K 4.5   < > 4.2  --  3.4* 3.4* 3.3* 3.3*  --   CL 77*   < > 93*  --  99 99 105 104  --   CO2 10*   < > 18*  --  21*  19* 26 24  --   GLUCOSE 1,593*   < > 1,085*  --  645* 652* 224* 252*  --   BUN 68*   < > 60*  --  53* 54* 40* 30*  --   CREATININE 3.46*   < > 2.76*  --  2.15* 2.12* 1.76* 1.49*  --   CALCIUM 8.4*   < > 8.5*  --  8.9 9.1 8.6* 8.6*  --   AST 21  --   --   --   --   --   --  22  --   ALT 25  --   --   --   --   --   --  21  --   ALKPHOS 111  --   --   --   --   --   --  71  --   BILITOT 1.1  --   --   --   --   --   --  0.6  --   ALBUMIN 3.7  --   --   --   --   --   --  2.9*  --   MG  --   --   --  2.4  --   --   --  1.6*  --   BNP  --   --   --   --   --   --   --   --  315.9*   < > = values in this interval not displayed.    ------------------------------------------------------------------------------------------------------------------ No results for input(s): CHOL, HDL, LDLCALC, TRIG, CHOLHDL, LDLDIRECT in the last 72 hours.  Lab Results  Component Value Date   HGBA1C 6.2 (H) 07/11/2019    No results for input(s): TSH, T4TOTAL, T3FREE, THYROIDAB in the last 72 hours.  Invalid input(s): FREET3 ------------------------------------------------------------------------------------------------------------------ ID Labs Recent Labs  Lab 04/16/21 2043 04/16/21 2315 04/17/21 0211 04/17/21 0657 04/17/21 1047 04/17/21 1342 04/18/21 0052  WBC 13.1*  --   --   --   --   --  9.2  PLT 187  --   --   --   --   --  132*  CREATININE 3.46*   < > 2.76* 2.15* 2.12* 1.76* 1.49*   < > = values in this interval not displayed.   Cardiac Enzymes No results for input(s): CKMB, TROPONINI, MYOGLOBIN in the last 168 hours.  Invalid input(s): CK  Radiology Reports DG Chest 2 View  Result Date: 04/16/2021 CLINICAL DATA:  Tremors, weakness, short of breath EXAM: CHEST - 2 VIEW COMPARISON:  04/07/2016 FINDINGS: Frontal and lateral views of the chest demonstrate an unremarkable cardiac silhouette. No acute airspace disease, effusion, or pneumothorax. No acute bony abnormalities. IMPRESSION: 1. No  acute intrathoracic process. Electronically Signed   By: Randa Ngo M.D.   On: 04/16/2021 21:30   DG Chest Port 1 View  Result Date: 04/18/2021 CLINICAL DATA:  Shortness of breath. EXAM: PORTABLE CHEST 1 VIEW COMPARISON:  04/16/2021 FINDINGS: The heart size and mediastinal contours are within normal limits. Both lungs are clear. The visualized skeletal structures are unremarkable. IMPRESSION: No active disease. Electronically Signed   By: Kerby Moors M.D.   On: 04/18/2021 08:21

## 2021-04-19 ENCOUNTER — Inpatient Hospital Stay (HOSPITAL_COMMUNITY): Payer: Medicare Other | Admitting: Anesthesiology

## 2021-04-19 ENCOUNTER — Inpatient Hospital Stay (HOSPITAL_COMMUNITY): Payer: Medicare Other

## 2021-04-19 ENCOUNTER — Encounter (HOSPITAL_COMMUNITY)
Admission: EM | Disposition: A | Discharge status: Home Health | Payer: Self-pay | Source: Ambulatory Visit | Attending: Internal Medicine

## 2021-04-19 DIAGNOSIS — I4891 Unspecified atrial fibrillation: Secondary | ICD-10-CM

## 2021-04-19 DIAGNOSIS — I6523 Occlusion and stenosis of bilateral carotid arteries: Secondary | ICD-10-CM

## 2021-04-19 DIAGNOSIS — I2510 Atherosclerotic heart disease of native coronary artery without angina pectoris: Secondary | ICD-10-CM

## 2021-04-19 DIAGNOSIS — Z9049 Acquired absence of other specified parts of digestive tract: Secondary | ICD-10-CM

## 2021-04-19 DIAGNOSIS — I10 Essential (primary) hypertension: Secondary | ICD-10-CM

## 2021-04-19 DIAGNOSIS — Z9889 Other specified postprocedural states: Secondary | ICD-10-CM

## 2021-04-19 DIAGNOSIS — R109 Unspecified abdominal pain: Secondary | ICD-10-CM

## 2021-04-19 DIAGNOSIS — E1110 Type 2 diabetes mellitus with ketoacidosis without coma: Secondary | ICD-10-CM

## 2021-04-19 DIAGNOSIS — Z9861 Coronary angioplasty status: Secondary | ICD-10-CM

## 2021-04-19 DIAGNOSIS — Z8249 Family history of ischemic heart disease and other diseases of the circulatory system: Secondary | ICD-10-CM

## 2021-04-19 HISTORY — PX: LAPAROTOMY: SHX154

## 2021-04-19 HISTORY — DX: Acquired absence of other specified parts of digestive tract: Z90.49

## 2021-04-19 HISTORY — PX: LAPAROSCOPY: SHX197

## 2021-04-19 LAB — CBC WITH DIFFERENTIAL/PLATELET
Abs Immature Granulocytes: 0.04 10*3/uL (ref 0.00–0.07)
Basophils Absolute: 0 10*3/uL (ref 0.0–0.1)
Basophils Relative: 0 %
Eosinophils Absolute: 0 10*3/uL (ref 0.0–0.5)
Eosinophils Relative: 0 %
HCT: 34.9 % — ABNORMAL LOW (ref 39.0–52.0)
Hemoglobin: 12.7 g/dL — ABNORMAL LOW (ref 13.0–17.0)
Immature Granulocytes: 1 %
Lymphocytes Relative: 21 %
Lymphs Abs: 1.8 10*3/uL (ref 0.7–4.0)
MCH: 33 pg (ref 26.0–34.0)
MCHC: 36.4 g/dL — ABNORMAL HIGH (ref 30.0–36.0)
MCV: 90.6 fL (ref 80.0–100.0)
Monocytes Absolute: 0.8 10*3/uL (ref 0.1–1.0)
Monocytes Relative: 9 %
Neutro Abs: 5.9 10*3/uL (ref 1.7–7.7)
Neutrophils Relative %: 69 %
Platelets: 101 10*3/uL — ABNORMAL LOW (ref 150–400)
RBC: 3.85 MIL/uL — ABNORMAL LOW (ref 4.22–5.81)
RDW: 12.6 % (ref 11.5–15.5)
WBC: 8.5 10*3/uL (ref 4.0–10.5)
nRBC: 0 % (ref 0.0–0.2)

## 2021-04-19 LAB — GLUCOSE, CAPILLARY
Glucose-Capillary: 101 mg/dL — ABNORMAL HIGH (ref 70–99)
Glucose-Capillary: 109 mg/dL — ABNORMAL HIGH (ref 70–99)
Glucose-Capillary: 117 mg/dL — ABNORMAL HIGH (ref 70–99)
Glucose-Capillary: 132 mg/dL — ABNORMAL HIGH (ref 70–99)
Glucose-Capillary: 94 mg/dL (ref 70–99)

## 2021-04-19 LAB — COMPREHENSIVE METABOLIC PANEL
ALT: 18 U/L (ref 0–44)
AST: 24 U/L (ref 15–41)
Albumin: 2.4 g/dL — ABNORMAL LOW (ref 3.5–5.0)
Alkaline Phosphatase: 60 U/L (ref 38–126)
Anion gap: 10 (ref 5–15)
BUN: 26 mg/dL — ABNORMAL HIGH (ref 8–23)
CO2: 23 mmol/L (ref 22–32)
Calcium: 7.8 mg/dL — ABNORMAL LOW (ref 8.9–10.3)
Chloride: 103 mmol/L (ref 98–111)
Creatinine, Ser: 1.33 mg/dL — ABNORMAL HIGH (ref 0.61–1.24)
GFR, Estimated: 59 mL/min — ABNORMAL LOW (ref >60)
Glucose, Bld: 88 mg/dL (ref 70–99)
Potassium: 3 mmol/L — ABNORMAL LOW (ref 3.5–5.1)
Sodium: 136 mmol/L (ref 135–145)
Total Bilirubin: 1.2 mg/dL (ref 0.3–1.2)
Total Protein: 5.1 g/dL — ABNORMAL LOW (ref 6.5–8.1)

## 2021-04-19 LAB — TYPE AND SCREEN
ABO/RH(D): O NEG
Antibody Screen: NEGATIVE

## 2021-04-19 LAB — C-REACTIVE PROTEIN: CRP: 18.7 mg/dL — ABNORMAL HIGH (ref <1.0)

## 2021-04-19 LAB — MAGNESIUM: Magnesium: 2.1 mg/dL (ref 1.7–2.4)

## 2021-04-19 LAB — LACTIC ACID, PLASMA
Lactic Acid, Venous: 1.3 mmol/L (ref 0.5–1.9)
Lactic Acid, Venous: 1.2 mmol/L (ref 0.5–1.9)

## 2021-04-19 LAB — LIPASE, BLOOD: Lipase: 85 U/L — ABNORMAL HIGH (ref 11–51)

## 2021-04-19 LAB — PROCALCITONIN: Procalcitonin: 0.86 ng/mL

## 2021-04-19 LAB — D-DIMER, QUANTITATIVE: D-Dimer, Quant: 6.43 ug/mL-FEU — ABNORMAL HIGH (ref 0.00–0.50)

## 2021-04-19 SURGERY — LAPAROSCOPY, DIAGNOSTIC
Anesthesia: General

## 2021-04-19 MED ORDER — OXYCODONE HCL 5 MG PO TABS: 5.0000 mg | ORAL_TABLET | ORAL | Status: DC | PRN

## 2021-04-19 MED ORDER — ALBUMIN HUMAN 5 % IV SOLN: INTRAVENOUS | Status: DC | PRN

## 2021-04-19 MED ORDER — HYDROMORPHONE HCL 1 MG/ML IJ SOLN
0.5000 mg | INTRAMUSCULAR | Status: DC | PRN
  Administered 2021-04-20 – 2021-04-23 (×11): 0.5 mg via INTRAVENOUS
  Filled 2021-04-19 (×11): qty 0.5

## 2021-04-19 MED ORDER — CEFAZOLIN SODIUM 1 G IJ SOLR
INTRAMUSCULAR | Status: AC
  Filled 2021-04-19: qty 20

## 2021-04-19 MED ORDER — DEXAMETHASONE SODIUM PHOSPHATE 10 MG/ML IJ SOLN
INTRAMUSCULAR | Status: DC | PRN
  Administered 2021-04-19: 5 mg via INTRAVENOUS

## 2021-04-19 MED ORDER — OXYCODONE HCL 5 MG PO TABS
10.0000 mg | ORAL_TABLET | ORAL | Status: DC | PRN
  Administered 2021-04-20 – 2021-04-25 (×6): 10 mg via ORAL
  Filled 2021-04-19 (×6): qty 2

## 2021-04-19 MED ORDER — POTASSIUM CHLORIDE CRYS ER 20 MEQ PO TBCR: 60.0000 meq | EXTENDED_RELEASE_TABLET | Freq: Once | ORAL | Status: DC

## 2021-04-19 MED ORDER — LIDOCAINE 2% (20 MG/ML) 5 ML SYRINGE
INTRAMUSCULAR | Status: AC
  Filled 2021-04-19: qty 5

## 2021-04-19 MED ORDER — PIPERACILLIN-TAZOBACTAM 3.375 G IVPB 30 MIN: 3.3750 g | Freq: Four times a day (QID) | INTRAVENOUS | Status: DC

## 2021-04-19 MED ORDER — MORPHINE SULFATE (PF) 2 MG/ML IV SOLN
1.0000 mg | INTRAVENOUS | Status: DC | PRN
  Administered 2021-04-19 – 2021-04-24 (×4): 1 mg via INTRAVENOUS
  Filled 2021-04-19 (×4): qty 1

## 2021-04-19 MED ORDER — LACTATED RINGERS IV SOLN: INTRAVENOUS | Status: DC

## 2021-04-19 MED ORDER — PROCHLORPERAZINE EDISYLATE 10 MG/2ML IJ SOLN: 10.0000 mg | INTRAMUSCULAR | Status: DC | PRN

## 2021-04-19 MED ORDER — 0.9 % SODIUM CHLORIDE (POUR BTL) OPTIME
TOPICAL | Status: DC | PRN
  Administered 2021-04-19 (×2): 1000 mL

## 2021-04-19 MED ORDER — PIPERACILLIN-TAZOBACTAM 3.375 G IVPB 30 MIN
3.3750 g | Freq: Once | INTRAVENOUS | Status: AC
  Administered 2021-04-19: 3.375 g via INTRAVENOUS
  Filled 2021-04-19: qty 50

## 2021-04-19 MED ORDER — SODIUM CHLORIDE 0.9 % IV SOLN
INTRAVENOUS | Status: DC | PRN
Start: 2021-04-19 — End: 2021-04-19

## 2021-04-19 MED ORDER — POTASSIUM CHLORIDE CRYS ER 20 MEQ PO TBCR
40.0000 meq | EXTENDED_RELEASE_TABLET | Freq: Once | ORAL | Status: AC
  Administered 2021-04-19: 40 meq via ORAL
  Filled 2021-04-19: qty 2

## 2021-04-19 MED ORDER — LACTATED RINGERS IV BOLUS
500.0000 mL | Freq: Once | INTRAVENOUS | Status: AC
  Administered 2021-04-19: 500 mL via INTRAVENOUS

## 2021-04-19 MED ORDER — LIDOCAINE 2% (20 MG/ML) 5 ML SYRINGE
INTRAMUSCULAR | Status: DC | PRN
  Administered 2021-04-19: 60 mg via INTRAVENOUS

## 2021-04-19 MED ORDER — FENTANYL CITRATE (PF) 100 MCG/2ML IJ SOLN
25.0000 ug | INTRAMUSCULAR | Status: DC | PRN
  Administered 2021-04-19: 25 ug via INTRAVENOUS

## 2021-04-19 MED ORDER — PIPERACILLIN-TAZOBACTAM 3.375 G IVPB
3.3750 g | Freq: Three times a day (TID) | INTRAVENOUS | Status: DC
  Filled 2021-04-19: qty 50

## 2021-04-19 MED ORDER — METOPROLOL TARTRATE 5 MG/5ML IV SOLN
5.0000 mg | Freq: Three times a day (TID) | INTRAVENOUS | Status: DC | PRN
  Administered 2021-04-19: 5 mg via INTRAVENOUS
  Filled 2021-04-19: qty 5

## 2021-04-19 MED ORDER — ONDANSETRON HCL 4 MG/2ML IJ SOLN
INTRAMUSCULAR | Status: AC
  Filled 2021-04-19: qty 2

## 2021-04-19 MED ORDER — METOPROLOL TARTRATE 25 MG PO TABS
25.0000 mg | ORAL_TABLET | Freq: Two times a day (BID) | ORAL | Status: DC
  Administered 2021-04-20 – 2021-05-03 (×27): 25 mg via ORAL
  Filled 2021-04-19 (×27): qty 1

## 2021-04-19 MED ORDER — MIDAZOLAM HCL 2 MG/2ML IJ SOLN
INTRAMUSCULAR | Status: DC | PRN
  Administered 2021-04-19: 2 mg via INTRAVENOUS

## 2021-04-19 MED ORDER — PROPOFOL 10 MG/ML IV BOLUS
INTRAVENOUS | Status: AC
  Filled 2021-04-19: qty 20

## 2021-04-19 MED ORDER — METOPROLOL TARTRATE 50 MG PO TABS
50.0000 mg | ORAL_TABLET | Freq: Two times a day (BID) | ORAL | Status: DC
  Administered 2021-04-19: 50 mg via ORAL
  Filled 2021-04-19: qty 1

## 2021-04-19 MED ORDER — PROTHROMBIN COMPLEX CONC HUMAN 500 UNITS IV KIT
3873.0000 [IU] | PACK | Status: AC
  Administered 2021-04-19: 3873 [IU] via INTRAVENOUS
  Filled 2021-04-19: qty 3312

## 2021-04-19 MED ORDER — DEXAMETHASONE SODIUM PHOSPHATE 10 MG/ML IJ SOLN
INTRAMUSCULAR | Status: AC
  Filled 2021-04-19: qty 1

## 2021-04-19 MED ORDER — SIMETHICONE 80 MG PO CHEW
80.0000 mg | CHEWABLE_TABLET | Freq: Four times a day (QID) | ORAL | Status: DC | PRN
  Administered 2021-04-22 – 2021-05-02 (×4): 80 mg via ORAL
  Filled 2021-04-19 (×4): qty 1

## 2021-04-19 MED ORDER — IOHEXOL 350 MG/ML SOLN
100.0000 mL | Freq: Once | INTRAVENOUS | Status: AC | PRN
  Administered 2021-04-19: 100 mL via INTRAVENOUS

## 2021-04-19 MED ORDER — ACETAMINOPHEN 325 MG PO TABS
650.0000 mg | ORAL_TABLET | Freq: Four times a day (QID) | ORAL | Status: DC
  Administered 2021-04-20 – 2021-04-28 (×30): 650 mg via ORAL
  Filled 2021-04-19 (×31): qty 2

## 2021-04-19 MED ORDER — LACTATED RINGERS IV BOLUS
500.0000 mL | Freq: Once | INTRAVENOUS | Status: AC
Start: 2021-04-19 — End: 2021-04-19

## 2021-04-19 MED ORDER — INSULIN ASPART 100 UNIT/ML IJ SOLN: 0.0000 [IU] | Freq: Three times a day (TID) | INTRAMUSCULAR | Status: DC

## 2021-04-19 MED ORDER — POTASSIUM CHLORIDE CRYS ER 20 MEQ PO TBCR
40.0000 meq | EXTENDED_RELEASE_TABLET | Freq: Once | ORAL | Status: AC
Start: 2021-04-19 — End: 2021-04-19
  Administered 2021-04-19: 40 meq via ORAL
  Filled 2021-04-19: qty 2

## 2021-04-19 MED ORDER — MIDAZOLAM HCL 2 MG/2ML IJ SOLN
INTRAMUSCULAR | Status: AC
  Filled 2021-04-19: qty 2

## 2021-04-19 MED ORDER — FENTANYL CITRATE (PF) 250 MCG/5ML IJ SOLN
INTRAMUSCULAR | Status: DC | PRN
  Administered 2021-04-19: 100 ug via INTRAVENOUS
  Administered 2021-04-19: 50 ug via INTRAVENOUS
  Administered 2021-04-19: 100 ug via INTRAVENOUS

## 2021-04-19 MED ORDER — PHENYLEPHRINE 40 MCG/ML (10ML) SYRINGE FOR IV PUSH (FOR BLOOD PRESSURE SUPPORT)
PREFILLED_SYRINGE | INTRAVENOUS | Status: AC
  Filled 2021-04-19: qty 10

## 2021-04-19 MED ORDER — ONDANSETRON HCL 4 MG/2ML IJ SOLN
INTRAMUSCULAR | Status: DC | PRN
  Administered 2021-04-19: 4 mg via INTRAVENOUS

## 2021-04-19 MED ORDER — CEFAZOLIN SODIUM-DEXTROSE 2-3 GM-%(50ML) IV SOLR
INTRAVENOUS | Status: DC | PRN
  Administered 2021-04-19: 2 g via INTRAVENOUS

## 2021-04-19 MED ORDER — ROCURONIUM BROMIDE 10 MG/ML (PF) SYRINGE
PREFILLED_SYRINGE | INTRAVENOUS | Status: AC
  Filled 2021-04-19: qty 10

## 2021-04-19 MED ORDER — ROCURONIUM BROMIDE 10 MG/ML (PF) SYRINGE
PREFILLED_SYRINGE | INTRAVENOUS | Status: DC | PRN
Start: 2021-04-19 — End: 2021-04-19
  Administered 2021-04-19: 20 mg via INTRAVENOUS
  Administered 2021-04-19: 60 mg via INTRAVENOUS

## 2021-04-19 MED ORDER — ONDANSETRON HCL 4 MG/2ML IJ SOLN
4.0000 mg | Freq: Four times a day (QID) | INTRAMUSCULAR | Status: DC | PRN
  Administered 2021-05-01: 4 mg via INTRAVENOUS
  Filled 2021-04-19: qty 2

## 2021-04-19 MED ORDER — PHENYLEPHRINE 40 MCG/ML (10ML) SYRINGE FOR IV PUSH (FOR BLOOD PRESSURE SUPPORT)
PREFILLED_SYRINGE | INTRAVENOUS | Status: DC | PRN
  Administered 2021-04-19: 120 ug via INTRAVENOUS
  Administered 2021-04-19: 80 ug via INTRAVENOUS

## 2021-04-19 MED ORDER — KCL IN DEXTROSE-NACL 10-5-0.45 MEQ/L-%-% IV SOLN
INTRAVENOUS | Status: DC
  Filled 2021-04-19 (×3): qty 1000

## 2021-04-19 MED ORDER — PROPOFOL 10 MG/ML IV BOLUS
INTRAVENOUS | Status: DC | PRN
  Administered 2021-04-19: 150 mg via INTRAVENOUS

## 2021-04-19 MED ORDER — FENTANYL CITRATE (PF) 100 MCG/2ML IJ SOLN
INTRAMUSCULAR | Status: AC
  Filled 2021-04-19: qty 2

## 2021-04-19 MED ORDER — ONDANSETRON HCL 4 MG/2ML IJ SOLN: 4.0000 mg | Freq: Four times a day (QID) | INTRAMUSCULAR | Status: DC | PRN

## 2021-04-19 MED ORDER — HYDROCODONE-ACETAMINOPHEN 5-325 MG PO TABS
1.0000 | ORAL_TABLET | ORAL | Status: DC | PRN
  Administered 2021-04-19 – 2021-04-24 (×5): 1 via ORAL
  Filled 2021-04-19 (×5): qty 1

## 2021-04-19 MED ORDER — METHOCARBAMOL 1000 MG/10ML IJ SOLN
500.0000 mg | Freq: Four times a day (QID) | INTRAVENOUS | Status: DC | PRN
  Filled 2021-04-19: qty 5

## 2021-04-19 MED ORDER — DOCUSATE SODIUM 100 MG PO CAPS
100.0000 mg | ORAL_CAPSULE | Freq: Two times a day (BID) | ORAL | Status: DC
  Administered 2021-04-20 – 2021-05-03 (×20): 100 mg via ORAL
  Filled 2021-04-19 (×21): qty 1

## 2021-04-19 MED ORDER — SUGAMMADEX SODIUM 200 MG/2ML IV SOLN
INTRAVENOUS | Status: DC | PRN
Start: 2021-04-19 — End: 2021-04-19
  Administered 2021-04-19 (×2): 100 mg via INTRAVENOUS

## 2021-04-19 MED ORDER — FENTANYL CITRATE (PF) 250 MCG/5ML IJ SOLN
INTRAMUSCULAR | Status: AC
  Filled 2021-04-19: qty 5

## 2021-04-19 MED ORDER — INSULIN ASPART 100 UNIT/ML IJ SOLN
0.0000 [IU] | INTRAMUSCULAR | Status: DC
  Administered 2021-04-20: 2 [IU] via SUBCUTANEOUS
  Administered 2021-04-20: 3 [IU] via SUBCUTANEOUS

## 2021-04-19 SURGICAL SUPPLY — 66 items
BAG COUNTER SPONGE SURGICOUNT (BAG) ×2 IMPLANT
BLADE 11 SAFETY STRL DISP (BLADE) ×1 IMPLANT
BLADE CLIPPER SURG (BLADE) ×1 IMPLANT
CANISTER SUCT 3000ML PPV (MISCELLANEOUS) ×2 IMPLANT
CHLORAPREP W/TINT 26 (MISCELLANEOUS) ×2 IMPLANT
COVER SURGICAL LIGHT HANDLE (MISCELLANEOUS) ×2 IMPLANT
DERMABOND ADVANCED (GAUZE/BANDAGES/DRESSINGS) ×1
DERMABOND ADVANCED .7 DNX12 (GAUZE/BANDAGES/DRESSINGS) ×1 IMPLANT
DRAIN PENROSE 1/4X12 LTX STRL (WOUND CARE) ×1 IMPLANT
DRAPE LAPAROSCOPIC ABDOMINAL (DRAPES) ×2 IMPLANT
DRAPE WARM FLUID 44X44 (DRAPES) ×1 IMPLANT
DRSG OPSITE POSTOP 4X10 (GAUZE/BANDAGES/DRESSINGS) ×1 IMPLANT
DRSG OPSITE POSTOP 4X8 (GAUZE/BANDAGES/DRESSINGS) IMPLANT
ELECT BLADE 6.5 EXT (BLADE) IMPLANT
ELECT CAUTERY BLADE 6.4 (BLADE) ×2 IMPLANT
ELECT REM PT RETURN 9FT ADLT (ELECTROSURGICAL) ×2
ELECTRODE REM PT RTRN 9FT ADLT (ELECTROSURGICAL) ×1 IMPLANT
GLOVE SRG 8 PF TXTR STRL LF DI (GLOVE) ×1 IMPLANT
GLOVE SURG ENC MOIS LTX SZ7.5 (GLOVE) ×2 IMPLANT
GLOVE SURG UNDER POLY LF SZ8 (GLOVE) ×4
GOWN STRL REUS W/ TWL LRG LVL3 (GOWN DISPOSABLE) ×2 IMPLANT
GOWN STRL REUS W/ TWL XL LVL3 (GOWN DISPOSABLE) ×1 IMPLANT
GOWN STRL REUS W/TWL LRG LVL3 (GOWN DISPOSABLE)
GOWN STRL REUS W/TWL XL LVL3 (GOWN DISPOSABLE) ×4
HANDLE SUCTION POOLE (INSTRUMENTS) ×1 IMPLANT
KIT BASIN OR (CUSTOM PROCEDURE TRAY) ×2 IMPLANT
KIT TURNOVER KIT B (KITS) ×2 IMPLANT
LIGASURE IMPACT 36 18CM CVD LR (INSTRUMENTS) ×1 IMPLANT
NDL INSUFFLATION 14GA 120MM (NEEDLE) ×1 IMPLANT
NEEDLE INSUFFLATION 14GA 120MM (NEEDLE) ×2 IMPLANT
NS IRRIG 1000ML POUR BTL (IV SOLUTION) ×3 IMPLANT
PACK GENERAL/GYN (CUSTOM PROCEDURE TRAY) ×2 IMPLANT
PAD ARMBOARD 7.5X6 YLW CONV (MISCELLANEOUS) ×4 IMPLANT
PENCIL SMOKE EVACUATOR (MISCELLANEOUS) ×2 IMPLANT
RELOAD PROXIMATE 75MM BLUE (ENDOMECHANICALS) ×8 IMPLANT
RELOAD STAPLE 75 3.8 BLU REG (ENDOMECHANICALS) IMPLANT
SCISSORS LAP 5X35 DISP (ENDOMECHANICALS) IMPLANT
SET IRRIG TUBING LAPAROSCOPIC (IRRIGATION / IRRIGATOR) IMPLANT
SET TUBE SMOKE EVAC HIGH FLOW (TUBING) ×2 IMPLANT
SLEEVE ENDOPATH XCEL 5M (ENDOMECHANICALS) ×3 IMPLANT
SOL ANTI FOG 6CC (MISCELLANEOUS) IMPLANT
SOLUTION ANTI FOG 6CC (MISCELLANEOUS) ×1
SPECIMEN JAR LARGE (MISCELLANEOUS) IMPLANT
SPONGE T-LAP 18X18 ~~LOC~~+RFID (SPONGE) ×2 IMPLANT
STAPLER PROXIMATE 75MM BLUE (STAPLE) ×1 IMPLANT
STAPLER VISISTAT 35W (STAPLE) ×3 IMPLANT
SUCTION POOLE HANDLE (INSTRUMENTS) ×2
SUT MNCRL AB 4-0 PS2 18 (SUTURE) ×2 IMPLANT
SUT PDS AB 1 TP1 54 (SUTURE) ×2 IMPLANT
SUT PDS AB 2-0 CT1 27 (SUTURE) ×2 IMPLANT
SUT SILK 2 0 SH CR/8 (SUTURE) ×2 IMPLANT
SUT SILK 2 0 TIES 10X30 (SUTURE) ×1 IMPLANT
SUT SILK 3 0 SH CR/8 (SUTURE) ×1 IMPLANT
SUT SILK 3 0 TIES 10X30 (SUTURE) ×1 IMPLANT
SUT VIC AB 2-0 SH 18 (SUTURE) ×4 IMPLANT
TOWEL GREEN STERILE (TOWEL DISPOSABLE) ×2 IMPLANT
TOWEL GREEN STERILE FF (TOWEL DISPOSABLE) ×2 IMPLANT
TRAY FOLEY MTR SLVR 16FR STAT (SET/KITS/TRAYS/PACK) ×2 IMPLANT
TRAY LAPAROSCOPIC MC (CUSTOM PROCEDURE TRAY) ×2 IMPLANT
TROCAR XCEL 12X100 BLDLESS (ENDOMECHANICALS) IMPLANT
TROCAR XCEL BLUNT TIP 100MML (ENDOMECHANICALS) ×1 IMPLANT
TROCAR XCEL NON-BLD 11X100MML (ENDOMECHANICALS) IMPLANT
TROCAR XCEL NON-BLD 5MMX100MML (ENDOMECHANICALS) ×2 IMPLANT
TUBING INSUFFLATOR W/FILTER (MISCELLANEOUS) ×1 IMPLANT
WARMER LAPAROSCOPE (MISCELLANEOUS) ×2 IMPLANT
YANKAUER SUCT BULB TIP NO VENT (SUCTIONS) ×1 IMPLANT

## 2021-04-19 NOTE — Transfer of Care (Signed)
Immediate Anesthesia Transfer of Care Note  Patient: [name]  Procedure(s) Performed: LAPAROSCOPY DIAGNOSTIC EXPLORATORY LAPAROTOMY, ILEOCECECTOMY  Patient Location: PACU  Anesthesia Type:General  Level of Consciousness: awake, alert  and oriented  Airway & Oxygen Therapy: Patient Spontanous Breathing and Patient connected to nasal cannula oxygen  Post-op Assessment: Report given to RN and Post -op Vital signs reviewed and stable  Post vital signs: Reviewed and stable  Last Vitals:  Vitals Value Taken Time  BP 149/72 04/19/21 2341  Temp    Pulse 85 04/19/21 2350  Resp 24 04/19/21 2350  SpO2 93 % 04/19/21 2350  Vitals shown include unvalidated device data.  Last Pain:  Vitals:   04/19/21 2029  TempSrc: Oral  PainSc:          Complications: No notable events documented.

## 2021-04-19 NOTE — Progress Notes (Signed)
Occupational Therapy Treatment Patient Details Name: [name] MRN: [id number] DOB: 1955-08-01 Today's Date: 04/19/2021   History of present illness Pt is a 66 y/o male admitted 1/19 with 5-7 day hx of weakness, increased urination and tremors.  Found with new onset DM type II with DKA and severe dehydration. PMH inculdes: anxiety, AKI, CAD, dementia, DKA, CVA, TIA.   OT comments  Nursing states patient has been tachycaredic but has been given medication to address and is scheduled for CT scan. Patient received side lying and stating he had BM and needed assistance for cleaning. Patient assisted with maintain side lying and rolling side to side. Patient instructed on LUE strengthening exercises with level 2 theraband. RUE not address due to IV site appeared agitated. Acute OT to continue to follow.    Recommendations for follow up therapy are one component of a multi-disciplinary discharge planning process, led by the attending physician.  Recommendations may be updated based on patient status, additional functional criteria and insurance authorization.    Follow Up Recommendations  Skilled nursing-short term rehab (<3 hours/day)    Assistance Recommended at Discharge Frequent or constant Supervision/Assistance  Patient can return home with the following  A lot of help with walking and/or transfers;A lot of help with bathing/dressing/bathroom;Assistance with cooking/housework;Direct supervision/assist for medications management;Direct supervision/assist for financial management;Assist for transportation;Help with stairs or ramp for entrance   Equipment Recommendations  BSC/3in1;Other (comment)    Recommendations for Other Services      Precautions / Restrictions Precautions Precautions: Fall Restrictions Weight Bearing Restrictions: No       Mobility Bed Mobility Overal bed mobility: Needs Assistance Bed Mobility: Rolling Rolling: Min guard         General bed mobility  comments: patient performed rolling in bed to address LB bathing    Transfers                         Balance                                           ADL either performed or assessed with clinical judgement   ADL Overall ADL's : Needs assistance/impaired             Lower Body Bathing: Maximal assistance;Bed level Lower Body Bathing Details (indicate cue type and reason): assisted patient with cleaning following BM in bed                       General ADL Comments: patient assisted with LB bathing with rolling side to side and maintianing side lying    Extremity/Trunk Assessment              Vision       Perception     Praxis      Cognition Arousal/Alertness: Awake/alert Behavior During Therapy: Flat affect Overall Cognitive Status: Impaired/Different from baseline Area of Impairment: Orientation, Attention, Memory, Following commands, Safety/judgement, Awareness, Problem solving                   Current Attention Level: Sustained Memory: Decreased recall of precautions, Decreased short-term memory Following Commands: Follows one step commands consistently, Follows one step commands with increased time, Follows multi-step commands inconsistently Safety/Judgement: Decreased awareness of safety, Decreased awareness of deficits Awareness: Emergent Problem Solving: Slow processing, Requires verbal cues, Difficulty sequencing  General Comments: oriented to time, place, and situation        Exercises Exercises: General Upper Extremity General Exercises - Upper Extremity Shoulder Flexion: Strengthening, Left, 10 reps, Supine, Theraband Theraband Level (Shoulder Flexion): Level 2 (Red) Shoulder ABduction: Left, 10 reps, Strengthening, Supine, Theraband Theraband Level (Shoulder Abduction): Level 2 (Red) Elbow Flexion: Strengthening, Left, 10 reps, Supine, Theraband Theraband Level (Elbow Flexion): Level 2  (Red) Elbow Extension: Strengthening, Left, 10 reps, Supine, Theraband Theraband Level (Elbow Extension): Level 2 (Red)    Shoulder Instructions       General Comments Elevated HR and patient scheduled for CT scan, treatment performed at bedlevel    Pertinent Vitals/ Pain       Pain Assessment Pain Assessment: Faces Faces Pain Scale: Hurts little more Pain Location: abdomen Pain Descriptors / Indicators: Burning, Discomfort, Grimacing Pain Intervention(s): Limited activity within patient's tolerance, Monitored during session, Repositioned  Home Living                                          Prior Functioning/Environment              Frequency  Min 2X/week        Progress Toward Goals  OT Goals(current goals can now be found in the care plan section)  Progress towards OT goals: Progressing toward goals  Acute Rehab OT Goals Patient Stated Goal: decreased stomach pain OT Goal Formulation: With patient Time For Goal Achievement: 05/01/21 Potential to Achieve Goals: Good ADL Goals Pt Will Perform Grooming: with supervision;sitting;with set-up Pt Will Perform Upper Body Bathing: with set-up;sitting Pt Will Perform Lower Body Dressing: with min assist;sit to/from stand;sitting/lateral leans Pt Will Transfer to Toilet: with min guard assist;ambulating;bedside commode Pt Will Perform Toileting - Clothing Manipulation and hygiene: with min guard assist;sit to/from stand;sitting/lateral leans Pt/caregiver will Perform Home Exercise Program: Increased strength;Both right and left upper extremity;With written HEP provided Additional ADL Goal #1: Pt will follow 2 step commands with increased time and 90% accuracy.  Plan Discharge plan remains appropriate    Co-evaluation                 AM-PAC OT "6 Clicks" Daily Activity     Outcome Measure   Help from another person eating meals?: A Lot Help from another person taking care of personal  grooming?: A Lot Help from another person toileting, which includes using toliet, bedpan, or urinal?: Total Help from another person bathing (including washing, rinsing, drying)?: A Lot Help from another person to put on and taking off regular upper body clothing?: A Lot Help from another person to put on and taking off regular lower body clothing?: Total 6 Click Score: 10    End of Session Equipment Utilized During Treatment: Oxygen  OT Visit Diagnosis: Other abnormalities of gait and mobility (R26.89);Muscle weakness (generalized) (M62.81);Other symptoms and signs involving the nervous system (R29.898)   Activity Tolerance Patient limited by pain   Patient Left in bed;with call bell/phone within reach   Nurse Communication Mobility status        Time: 5638-7564 OT Time Calculation (min): 23 min  Charges: OT General Charges $OT Visit: 1 Visit OT Treatments $Self Care/Home Management : 8-22 mins $Therapeutic Exercise: 8-22 mins  Lodema Hong, [place]  Pager [phone] Office [phone]   Trixie Dredge 04/19/2021, 10:30 AM

## 2021-04-19 NOTE — Op Note (Signed)
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     Operative Team Members:  Surgeon(s) and Role:    * Anagabriela Jokerst, Nickola Major, MD - Primary   Anesthesiologist: Darral Dash, DO CRNA: Trinna Post., CRNA   Anesthesia: General   Fluids:  Total I/O In: 7782 [I.V.:1500; IV Piggyback:250] Out: 500 [Urine:200; UMPNT:614]  Complications: None  Drains:  Penrose drain in the subcutaneous layer of the closure    Specimen:  ID Type Source Tests Collected by Time Destination  1 : ileocecum Tissue PATH GI Other SURGICAL PATHOLOGY Asal Teas, Nickola Major, MD 04/19/2021 2216      Disposition:  PACU - hemodynamically stable.  Plan of Care:  Continue inpatient care    Indications for Procedure: [name] is a 66 y.o. male who presented with abdominal pain and DKA.  CT was concerning for embolic mesenteric ischemia and his abdominal pain was severe so I recommended diagnostic laparoscopy with possible ileocecectomy.  The procedure itself as well as its risks, benefits and alternatives were discussed.  The risks discussed included but were not limited to the risk of infection, bleeding, damage to nearby structures, and anastomotic leak or need for colostomy.  After a full discussion and all questions answered the patient granted consent to proceed.  Findings: Short segment of ischemic distal ileum, a few centimeters proximal to the ileocecal valve   Description of Procedure:   On the date stated above the patient was taken the operating room suite and placed in supine position.  General endotracheal anesthesia was induced.  A timeout was completed verifying the correct patient, procedure,  positioning, and equipment needed for the case.  Antibiotics were given prior to the case start.  SCDs were placed on patient's lower extremities.  I started by making a 5 mm incision and dissecting down to the abdominal wall.  The fascia was grasped and elevated and a 5 mm trocar was inserted using the optical technique.  The abdomen was insufflated to 15 mmHg.  The abdomen was inspected.  There appeared to be some inflammation in the right lower quadrant, however I did not clearly see any ischemic bowel.  A second 5 Miller trocar was placed in the suprapubic region.  I used a laparoscopic grasper to manipulate the small bowel and inspect the right lower quadrant.  The cecum appeared well perfused the appendix appeared normal.  There was an ischemic appearing segment of small bowel a few centimeters proximal to the ileocecal valve.  The area of ischemia was probably about 10 cm in length.  At this point decided to proceed with open surgery to perform an ileocecectomy.   I made a midline laparotomy incision dissected into the abdomen without any trauma the underlying viscera.  The patient was placed in headdown left side down position.  The distal ileum and cecum were lifted out of the retroperitoneum by dividing the attachments between the mesentery and mesocolon to the retroperitoneum.  This dissection was continued up with the right paracolic gutter by dividing the white line of Toldt.  This brought the right colon and terminal ileum into the field.  There were multiple attachments between the omentum, the right colon, and the right abdominal sidewall, tethering the right colon into the abdomen.  These were divided using bipolar energy.  There was a traction injury on the cecum that created a serosal tear that was repaired with Vicryl sutures.  Once we had adequate mobilization we proceeded with resection.  A GIA stapler was fired proximal to the segment of ischemia on the ileum.  The stapler was also fired  across the cecum trying to preserve as much cecal blood flow was possible.  The staple line was placed just to the edge of the ileocecal valve.  The cecal staple line was oversewn using 2-0 Vicryl suture.  Enterotomies were created along the tinea of the cecum and on the antimesenteric border of the distal ileum.  The 75 mm GIA stapler was inserted into both piece of intestine and then fired to create the anastomosis.  The common enterotomy was closed in 2 layers using 2-0 Vicryl suture.  A 2-0 Vicryl suture was placed to reinforce the end of the staple line.  The mesenteric defect was closed using running 2-0 silk suture.  The anastomosis was returned to the abdomen.  The abdomen was irrigated with 3 L of saline irrigation.  The midline fascia was closed using running 2-0 Vicryl suture.  The deep dermal layer was reapproximated using 2-0 Vicryl suture.  The skin was closed using staples.  A Penrose drain was left in the subcutaneous layer of the closure to prevent fluid collection wound infection.  A sterile dressing was applied.  All sponge needle counts were correct at the end of this case.  At the end of the case we reviewed the infection status of the case. Patient: [name] [facility] Patient Case: Emergent Infection Present At Time Of Surgery (PATOS): Purulent Peritonitis around the ischemic segment of distal ileum.  Some contamination from creating the ileocolic anastomosis.  Louanna Raw, MD General, Bariatric, & Minimally Invasive Surgery [facility], [place]

## 2021-04-19 NOTE — Progress Notes (Signed)
Pharmacy Antibiotic Note  [name] is a 66 y.o. male with abdominal pain and possible possible ileitis. Pharmacy has been consulted for zosyn dosing.  Plan: -Zosyn 3.375gm IV q8h -Will follow renal function, cultures and clinical progress   Height: 6' (182.9 cm) Weight: 81.6 kg (180 lb) IBW/kg (Calculated) : 77.6  Temp (24hrs), Avg:98.1 F (36.7 C), Min:97.5 F (36.4 C), Max:99.2 F (37.3 C)  Recent Labs  Lab 04/16/21 2043 04/16/21 2315 04/17/21 0657 04/17/21 1047 04/17/21 1342 04/18/21 0052 04/19/21 0117  WBC 13.1*  --   --   --   --  9.2 8.5  CREATININE 3.46*   < > 2.15* 2.12* 1.76* 1.49* 1.33*   < > = values in this interval not displayed.    Estimated Creatinine Clearance: 60 mL/min (A) (by C-G formula based on SCr of 1.33 mg/dL (H)).    Allergies  Allergen Reactions   Vicodin [Hydrocodone-Acetaminophen] Nausea And Vomiting     Thank you for allowing pharmacy to be a part of this patients care.  Hildred Laser, PharmD Clinical Pharmacist **Pharmacist phone directory can now be found on [email or web address] ([email or web address]).  Listed under [place].

## 2021-04-19 NOTE — Progress Notes (Signed)
Patient currently tachycardic in 120's to 130's. Is asymptomatic.

## 2021-04-19 NOTE — Anesthesia Preprocedure Evaluation (Addendum)
Anesthesia Evaluation  Patient identified by MRN, date of birth, ID band Patient awake    Reviewed: Allergy & Precautions, Patient's Chart, lab work & pertinent test results  Airway Mallampati: I  TM Distance: >3 FB Neck ROM: Full    Dental  (+) Edentulous Upper, Edentulous Lower   Pulmonary Current Smoker and Patient abstained from smoking.,    Pulmonary exam normal        Cardiovascular hypertension, Pt. on medications and Pt. on home beta blockers + CAD, + Past MI (2017) and + Peripheral Vascular Disease   Rhythm:Regular Rate:Normal     Neuro/Psych Anxiety Depression Dementia TIACVA    GI/Hepatic negative GI ROS, Neg liver ROS,   Endo/Other  diabetes  Renal/GU   negative genitourinary   Musculoskeletal  (+) Arthritis ,   Abdominal Normal abdominal exam  (+)   Peds  Hematology negative hematology ROS (+)   Anesthesia Other Findings   Reproductive/Obstetrics                           Anesthesia Physical Anesthesia Plan  ASA: 3  Anesthesia Plan: General   Post-op Pain Management:    Induction: Intravenous  PONV Risk Score and Plan:   Airway Management Planned: Mask and Oral ETT  Additional Equipment: None  Intra-op Plan:   Post-operative Plan: Extubation in OR  Informed Consent: I have reviewed the patients History and Physical, chart, labs and discussed the procedure including the risks, benefits and alternatives for the proposed anesthesia with the patient or authorized representative who has indicated his/her understanding and acceptance.     Dental advisory given  Plan Discussed with: CRNA  Anesthesia Plan Comments: (Lab Results      Component                Value               Date                      WBC                      8.5                 04/19/2021                HGB                      12.7 (L)            04/19/2021                HCT                       34.9 (L)            04/19/2021                MCV                      90.6                04/19/2021                PLT                      101 (L)  04/19/2021           Lab Results      Component                Value               Date                      NA                       136                 04/19/2021                K                        3.0 (L)             04/19/2021                CO2                      23                  04/19/2021                GLUCOSE                  88                  04/19/2021                BUN                      26 (H)              04/19/2021                CREATININE               1.33 (H)            04/19/2021                CALCIUM                  7.8 (L)             04/19/2021                EGFR                     64                  10/09/2020                GFRNONAA                 59 (L)              04/19/2021           Echocardiogram 09/23/2020:  Left ventricle cavity is normal in size. Moderate concentric hypertrophy  of the left ventricle. Normal global wall motion. Normal LV systolic  function with EF 55%. Doppler evidence of grade I (impaired) diastolic  dysfunction, normal LAP.  Left atrial cavity is moderately dilated.  Trileaflet aortic valve. Trace aortic regurgitation.  Moderate (Grade II) mitral regurgitation.  Mild to moderate tricuspid regurgitation. Estimated  pulmonary artery  systolic pressure 29 mmHg.  Compared to previous study on 07/17/2020, no significant changes noted.)       Anesthesia Quick Evaluation

## 2021-04-19 NOTE — Progress Notes (Addendum)
°                                  PROGRESS NOTE                                             °                                                                                                                     °                                         ° ° Patient Demographics:  ° ° [name], is a 66 y.o. male, DOB - 02/24/1956, MRN:[id number] ° °Outpatient Primary MD for the patient is Avbuere, Edwin, MD    LOS - 3  Admit date - 04/16/2021   ° °Chief Complaint  °Patient presents with  ° Hyperglycemia  °    ° °Brief Narrative (HPI from H&P)   66 y.o. male with medical history significant of CAD, HTN.  He presented to the ER with about 5 to 7-day history of feeling weak with increase in urination and thirst along with some tremors.  In the ER he was diagnosed with new onset DM type II with DKA and admitted to the hospital ° ° Subjective:  ° °Patient in bed, appears comfortable, denies any headache, no fever, no chest pain or pressure, no shortness of breath , +ve abdominal pain. No new focal weakness. ° ° ° Assessment  & Plan :  ° ° °No notes have been filed under this hospital service. °Service: Hospitalist °  ° °New diagnosis of DM type II with DKA on presentation - with severe dehydration - AKI, pseudohyponatremia and metabolic acidosis due to DKA. ° °He was treated with DKA protocol along with aggressive IV fluids and electrolyte replacement, gap is closed he has been transitioned to Lantus and sliding scale dose adjusted on 04/19/2021 due to diet changes on this day for better control(kindly see #6 below) , diabetic and insulin education, continue to monitor if CBGs are stable likely discharge in the next 1 to 2 days.  He has developed abdominal pain today. ° ° °2.  AKI due to severe dehydration - holding ARB and HCTZ, has been aggressively hydrated with IV fluids since he has developed a few Rales holding further IV fluids but have encouraged oral hydration and to sit up in  chair in the daytime.  Advance activity. ° °3.  Paroxysmal atrial fibrillation.  Continue beta-blocker and Eliquis combination.  Chad vas 2 score of greater than 3. ° °4.  Hypertension.  Blood pressure is better on beta-blocker monitor add as needed hydralazine.. ° °5.    Dyslipidemia on statin and Vascepa. ° °6.  Sudden onset of periumbilical abdominal pain relieved after 2 bowel movements, abdominal exam is not impressive, CT scan with nonspecific small bowel findings of possible ileitis, will cut down diet to soft diet, he is feeling much better after bowel movements will monitor closely.  If this persist will involve GI. ° °Addendum - 4pm - pain back again, DW GI Dr Hung, NPO, IVF, IV ABX, ? Isch gut, H/O PAD,CAD, Smoking, Afib , will get CTA, consult VVS  - Gen Surg  as well - monitor. ° ° °7. Hypokalemia - replaced. ° °8.  Hypomagnesemia.  Aggressively replaced. ° °9. H/O gout.  On allopurinol. ° °Encouraged the patient to sit up in chair in the daytime use I-S and flutter valve for pulmonary toiletry.  Will advance activity and titrate down oxygen as possible. ° °Lab Results  °Component Value Date  ° HGBA1C 6.2 (H) 07/11/2019  ° °CBG (last 3)  °Recent Labs  °  04/18/21 °2009 04/19/21 °0404 04/19/21 °0813  °GLUCAP 142* 101* 132*  ° ° °   ° °Condition - Fair ° °Family Communication  :  called wife Brenda [phone] 04/19/21 message left at 4.39 pm ° °Code Status :  Full ° °Consults  :  None ° °PUD Prophylaxis : PPI ° ° Procedures  :    ° ° CT - 1. Mild circumferential wall thickening of the distal ileum with adjacent inflammation and small amount of free fluid. This is nonspecific but may represent an infectious or inflammatory enteritis. Given very heavy mesenteric arterial calcifications, ischemia is not entirely excluded but no evidence of pneumatosis intestinalis or pneumoperitoneum. Mildly distended loops of fluid-filled small bowel proximally without transition point, which may represent a mild ileus or  low-grade partial obstruction at the distal ileum. 2. Mild bilateral LOWER lobe cylindrical bronchiectasis with minimal tree in bud opacities in the LEFT LOWER lobe, likely representing a mild infectious or inflammatory process. 3. Coronary artery disease and aortic Atherosclerosis (ICD10-I70.0).  ° °   ° °Disposition Plan  :   ° °Status is: Inpatient ° °Remains inpatient appropriate because: DKA ° °DVT Prophylaxis  :   ° °Place TED hose Start: 04/18/21 1101 °apixaban (ELIQUIS) tablet 5 mg  °  ° °Lab Results  °Component Value Date  ° PLT 101 (L) 04/19/2021  ° ° °Diet :  °Diet Order   ° °       °  Diet NPO time specified Except for: Sips with Meds  Diet effective now       °  ° °  °  ° °  °  ° °Inpatient Medications ° °Scheduled Meds: ° allopurinol  100 mg Oral Daily  ° apixaban  5 mg Oral BID  ° atorvastatin  80 mg Oral QHS  ° escitalopram  20 mg Oral Daily  ° icosapent Ethyl  2 g Oral BID  ° insulin starter kit- pen needles  1 kit Other Once  ° living well with diabetes book   Does not apply Once  ° metoprolol tartrate  50 mg Oral BID  ° pantoprazole  40 mg Oral Daily  ° traZODone  100 mg Oral QHS  ° °Continuous Infusions: ° ° °PRN Meds:.busPIRone, dextrose, hydrALAZINE, hydrOXYzine, metoprolol tartrate ° °Antibiotics  :   ° °Anti-infectives (From admission, onward)  ° ° None  ° °  ° ° ° Time Spent in minutes  30 ° ° °  M.D on 04/19/2021 at 11:02 AM ° °  To page go to [email or web address]  ° °[facility] -  Office  [phone] ° °See all Orders from today for further details ° ° ° Objective:  ° °Vitals:  ° 04/18/21 2330 04/19/21 0340 04/19/21 0755 04/19/21 0800  °BP: 101/60 108/70    °Pulse: 89 96 (!) 117   °Resp: 20 20  20  °Temp: 98.7 °F (37.1 °C) 98.5 °F (36.9 °C)  99.2 °F (37.3 °C)  °TempSrc: Oral Oral  Oral  °SpO2: 94% 94%    °Weight:      °Height:      ° ° °Wt Readings from Last 3 Encounters:  °04/16/21 81.6 kg  °03/12/21 85.3 kg  °09/09/20 86.8 kg  ° ° ° °Intake/Output Summary (Last 24 hours) at  04/19/2021 1102 °Last data filed at 04/19/2021 0700 °Gross per 24 hour  °Intake 100 ml  °Output 850 ml  °Net -750 ml  ° ° ° °Physical Exam ° °Awake Alert, No new F.N deficits, Normal affect °Zachary.AT,PERRAL °Supple Neck, No JVD,   °Symmetrical Chest wall movement, Good air movement bilaterally, CTAB °RRR,No Gallops, Rubs or new Murmurs,  °+ve B.Sounds, Abd Soft, mild peri umbilical tenderness,   °No Cyanosis, Clubbing or edema  ° °  ° ° Data Review:  ° ° °CBC °Recent Labs  °Lab 04/16/21 °2043 04/16/21 °2051 04/18/21 °0052 04/19/21 °0117  °WBC 13.1*  --  9.2 8.5  °HGB 14.5 15.3 15.0 12.7*  °HCT 45.4 45.0 40.4 34.9*  °PLT 187  --  132* 101*  °MCV 104.1*  --  88.4 90.6  °MCH 33.3  --  32.8 33.0  °MCHC 31.9  --  37.1* 36.4*  °RDW 12.7  --  12.7 12.6  °LYMPHSABS 1.1  --  1.0 1.8  °MONOABS 0.9  --  0.9 0.8  °EOSABS 0.0  --  0.0 0.0  °BASOSABS 0.0  --  0.0 0.0  ° ° °Electrolytes °Recent Labs  °Lab 04/16/21 °2043 04/16/21 °2051 04/17/21 °0211 04/17/21 °0656 04/17/21 °0657 04/17/21 °1047 04/17/21 °1342 04/18/21 °0052 04/18/21 °0729 04/19/21 °0117  °NA 114*   < > 127*  --  133* 135 138 137  --  136  °K 4.5   < > 4.2  --  3.4* 3.4* 3.3* 3.3*  --  3.0*  °CL 77*   < > 93*  --  99 99 105 104  --  103  °CO2 10*   < > 18*  --  21* 19* 26 24  --  23  °GLUCOSE 1,593*   < > 1,085*  --  645* 652* 224* 252*  --  88  °BUN 68*   < > 60*  --  53* 54* 40* 30*  --  26*  °CREATININE 3.46*   < > 2.76*  --  2.15* 2.12* 1.76* 1.49*  --  1.33*  °CALCIUM 8.4*   < > 8.5*  --  8.9 9.1 8.6* 8.6*  --  7.8*  °AST 21  --   --   --   --   --   --  22  --  24  °ALT 25  --   --   --   --   --   --  21  --  18  °ALKPHOS 111  --   --   --   --   --   --  71  --  60  °BILITOT 1.1  --   --   --   --   --   --    0.6  --  1.2  °ALBUMIN 3.7  --   --   --   --   --   --  2.9*  --  2.4*  °MG  --   --   --  2.4  --   --   --  1.6*  --  2.1  °PROCALCITON  --   --   --   --   --   --   --  1.44  --  0.86  °TSH  --   --  0.636  --   --   --   --   --   --   --   °BNP  --    --   --   --   --   --   --   --  315.9*  --   ° < > = values in this interval not displayed.  ° ° °------------------------------------------------------------------------------------------------------------------ °No results for input(s): CHOL, HDL, LDLCALC, TRIG, CHOLHDL, LDLDIRECT in the last 72 hours. ° °Lab Results  °Component Value Date  ° HGBA1C 6.2 (H) 07/11/2019  ° ° °Recent Labs  °  04/17/21 °0211  °TSH 0.636  ° °------------------------------------------------------------------------------------------------------------------ °ID Labs °Recent Labs  °Lab 04/16/21 °2043 04/16/21 °2315 04/17/21 °0657 04/17/21 °1047 04/17/21 °1342 04/18/21 °0052 04/19/21 °0117  °WBC 13.1*  --   --   --   --  9.2 8.5  °PLT 187  --   --   --   --  132* 101*  °PROCALCITON  --   --   --   --   --  1.44 0.86  °CREATININE 3.46*   < > 2.15* 2.12* 1.76* 1.49* 1.33*  ° < > = values in this interval not displayed.  ° °Cardiac Enzymes °No results for input(s): CKMB, TROPONINI, MYOGLOBIN in the last 168 hours. ° °Invalid input(s): CK ° °Radiology Reports °CT ABDOMEN PELVIS WO CONTRAST ° °Result Date: 04/19/2021 °CLINICAL DATA:  65-year-old male with acute abdominal and pelvic pain. EXAM: CT ABDOMEN AND PELVIS WITHOUT CONTRAST TECHNIQUE: Multidetector CT imaging of the abdomen and pelvis was performed following the standard protocol without IV contrast. COMPARISON:  05/01/2008 CT FINDINGS: Unenhanced CT was performed per clinician order. Lack of IV contrast limits sensitivity and specificity, especially for evaluation of vascular structures and abdominal/pelvic solid viscera. Lower chest: Mild bilateral LOWER lobe cylindrical bronchiectasis noted with minimal tree in bud opacities in the LEFT LOWER lobe. Coronary artery atherosclerotic calcifications are present. Hepatobiliary: No hepatic or gallbladder abnormalities are identified. No biliary dilatation noted. Pancreas: Unremarkable Spleen: Unremarkable Adrenals/Urinary Tract: The  kidneys, adrenal glands and bladder are unremarkable. Stomach/Bowel: Mildly distended loops of fluid-filled small bowel are noted without transition point. There is mild circumferential wall thickening of the distal ileum to the cecum with adjacent inflammation and small amount of free fluid. No other areas of bowel wall thickening are noted. No evidence of pneumoperitoneum, pneumatosis intestinalis or portal venous gas. The appendix appears normal. Vascular/Lymphatic: Heavy arterial vascular calcifications are noted including aortic atherosclerosis. Heavy SMA and IMA calcifications are identified. No enlarged lymph nodes are identified. Reproductive: Prostate is unremarkable. Other: Small amount of free fluid is noted within the pelvis, RIGHT LOWER quadrant and adjacent to the liver and spleen. Musculoskeletal: No acute or suspicious bony abnormalities are noted. IMPRESSION: 1. Mild circumferential wall thickening of the distal ileum with adjacent inflammation and small amount of free fluid. This is nonspecific but may represent an infectious or inflammatory enteritis. Given very heavy   the distal ileum with adjacent inflammation and small amount of free fluid. This is nonspecific but may represent an infectious or inflammatory enteritis. Given very heavy mesenteric arterial calcifications, ischemia is not entirely excluded but no evidence of pneumatosis intestinalis or pneumoperitoneum. Mildly distended loops of fluid-filled small bowel proximally without transition point, which may represent a mild ileus or low-grade partial obstruction at the distal ileum. 2. Mild bilateral LOWER lobe cylindrical bronchiectasis with minimal tree in bud opacities in the LEFT LOWER lobe, likely representing a mild infectious or inflammatory process. 3. Coronary artery disease and aortic Atherosclerosis (ICD10-I70.0). Electronically Signed   By: Margarette Canada M.D.   On: 04/19/2021 11:00   DG Chest 2 View  Result Date: 04/16/2021 CLINICAL DATA:  Tremors, weakness, short of breath EXAM: CHEST - 2 VIEW COMPARISON:  04/07/2016 FINDINGS: Frontal and lateral views of the chest demonstrate an unremarkable  cardiac silhouette. No acute airspace disease, effusion, or pneumothorax. No acute bony abnormalities. IMPRESSION: 1. No acute intrathoracic process. Electronically Signed   By: Randa Ngo M.D.   On: 04/16/2021 21:30   DG Chest Port 1 View  Result Date: 04/18/2021 CLINICAL DATA:  Shortness of breath. EXAM: PORTABLE CHEST 1 VIEW COMPARISON:  04/16/2021 FINDINGS: The heart size and mediastinal contours are within normal limits. Both lungs are clear. The visualized skeletal structures are unremarkable. IMPRESSION: No active disease. Electronically Signed   By: Kerby Moors M.D.   On: 04/18/2021 08:21

## 2021-04-19 NOTE — Consult Note (Addendum)
Reason for Consult: Abdominal pain,?  Ischemic bowel Referring Physician: Dr. [name] is an 66 y.o. male.  HPI: Patient is a 66 year old male who was admitted 3 days ago secondary to hyperglycemia.  Patient does have history of CAD, hypertension.  Patient was found to have DKA in the ER.  He underwent admission and treatment.  Patient with a history of A. fib on Eliquis.    Patient began developing abdominal pain yesterday morning.  He states that abdominal pain has progressed.  Patient has been taking p.o. with some diarrhea.  He states there is no blood however appears to be [name]-colored stool.  Patient with no repeat labs currently on the chart. Patient underwent CT scan secondary abdominal pain.  This was significant for some thickening of the distal ileum, cannot rule out pneumatosis or ischemic bowel.  CT a of his abdomen pelvis was ordered as well as vascular and general surgery consult.  Past Medical History:  Diagnosis Date   AKI (acute kidney injury) ([facility]) 12/2015   Anxiety    Arthritis    Carotid artery stenosis    Coronary artery disease    Dementia ([facility])    problems with short term memory   Depression    DKA (diabetic ketoacidosis) ([facility]) 04/16/2021   Dysrhythmia    episode of afib 12/2005 in setting of cocaine use   Gout    Hyperlipidemia    Hypertension    Myocardial infarction [facility])    2017   PAD (peripheral artery disease) ([facility])    Stroke ([facility])    TIA   TIA (transient ischemic attack)    over 20 years ago    Past Surgical History:  Procedure Laterality Date   CARDIAC CATHETERIZATION N/A 12/18/2015   Procedure: Left Heart Cath and Coronary Angiography;  Surgeon: Evan Prows, MD;  Location: [place] [facility];  Service: Cardiovascular;  Laterality: N/A;   CARDIAC CATHETERIZATION N/A 12/18/2015   Procedure: Coronary Stent Intervention;  Surgeon: Evan Prows, MD;  Location: [place] [facility];  Service: Cardiovascular;  Laterality: N/A;   CARDIAC  CATHETERIZATION N/A 12/18/2015   Procedure: Coronary Balloon Angioplasty;  Surgeon: Evan Prows, MD;  Location: [place] [facility];  Service: Cardiovascular;  Laterality: N/A;   COLONOSCOPY N/A 01/12/2016   Procedure: COLONOSCOPY;  Surgeon: Evan Gunning, MD;  Location: [facility] ENDOSCOPY;  Service: Gastroenterology;  Laterality: N/A;   ENDARTERECTOMY Left 03/04/2020   Procedure: LEFT ENDARTERECTOMY CAROTID;  Surgeon: Evan Posner, MD;  Location: [facility] OR;  Service: Vascular;  Laterality: Left;   ENDARTERECTOMY Right 04/22/2020   Procedure: RIGHT CAROTID ENDARTERECTOMY;  Surgeon: Evan Posner, MD;  Location: [place];  Service: Vascular;  Laterality: Right;   HERNIA REPAIR     PATCH ANGIOPLASTY Left 03/04/2020   Procedure: PATCH ANGIOPLASTY;  Surgeon: Evan Posner, MD;  Location: [place];  Service: Vascular;  Laterality: Left;   PERIPHERAL VASCULAR CATHETERIZATION N/A 12/18/2015   Procedure: Abdominal Aortogram;  Surgeon: Evan Prows, MD;  Location: [place] [facility];  Service: Cardiovascular;  Laterality: N/A;    Family History  Problem Relation Age of Onset   Heart attack Mother    Heart attack Father    IgA nephropathy Son     Social History:  reports that he has been smoking cigarettes. He has a 21.00 pack-year smoking history. He has never used smokeless tobacco. He reports that he does not currently use alcohol. He reports current drug use. Drug: Marijuana.  Allergies:  Allergies  Allergen Reactions   Vicodin [Hydrocodone-Acetaminophen] Nausea And Vomiting    Medications: I have reviewed the patient's current medications.  Results for orders placed or performed during the hospital encounter of 04/16/21 (from the past 48 hour(s))  Glucose, capillary     Status: Abnormal   Collection Time: 04/17/21  9:31 PM  Result Value Ref Range   Glucose-Capillary 273 (H) 70 - 99 mg/dL    Comment: Glucose reference range applies only to samples taken after fasting for at least 8 hours.  Magnesium      Status: Abnormal   Collection Time: 04/18/21 12:52 AM  Result Value Ref Range   Magnesium 1.6 (L) 1.7 - 2.4 mg/dL    Comment: Performed at [facility] [address], [place], [place] [address]  Comprehensive metabolic panel     Status: Abnormal   Collection Time: 04/18/21 12:52 AM  Result Value Ref Range   Sodium 137 135 - 145 mmol/L   Potassium 3.3 (L) 3.5 - 5.1 mmol/L   Chloride 104 98 - 111 mmol/L   CO2 24 22 - 32 mmol/L   Glucose, Bld 252 (H) 70 - 99 mg/dL    Comment: Glucose reference range applies only to samples taken after fasting for at least 8 hours.   BUN 30 (H) 8 - 23 mg/dL   Creatinine, Ser 1.49 (H) 0.61 - 1.24 mg/dL   Calcium 8.6 (L) 8.9 - 10.3 mg/dL   Total Protein 5.7 (L) 6.5 - 8.1 g/dL   Albumin 2.9 (L) 3.5 - 5.0 g/dL   AST 22 15 - 41 U/L   ALT 21 0 - 44 U/L   Alkaline Phosphatase 71 38 - 126 U/L   Total Bilirubin 0.6 0.3 - 1.2 mg/dL   GFR, Estimated 51 (L) >60 mL/min    Comment: (NOTE) Calculated using the CKD-EPI Creatinine Equation (2021)    Anion gap 9 5 - 15    Comment: Performed at [facility], [place] [address] [place], [place] [address]  CBC with Differential/Platelet     Status: Abnormal   Collection Time: 04/18/21 12:52 AM  Result Value Ref Range   WBC 9.2 4.0 - 10.5 K/uL   RBC 4.57 4.22 - 5.81 MIL/uL   Hemoglobin 15.0 13.0 - 17.0 g/dL   HCT 40.4 39.0 - 52.0 %   MCV 88.4 80.0 - 100.0 fL    Comment: REPEATED TO VERIFY M WAX RN SAID [name] TO ACCEPT RESULTS ON 04/18/21@0258  BY [facility]    MCH 32.8 26.0 - 34.0 pg   MCHC 37.1 (H) 30.0 - 36.0 g/dL    Comment: REPEATED TO VERIFY CORRECTED FOR COLD AGGLUTININS    RDW 12.7 11.5 - 15.5 %   Platelets 132 (L) 150 - 400 K/uL   nRBC 0.0 0.0 - 0.2 %   Neutrophils Relative % 80 %   Neutro Abs 7.4 1.7 - 7.7 K/uL   Lymphocytes Relative 11 %   Lymphs Abs 1.0 0.7 - 4.0 K/uL   Monocytes Relative 9 %   Monocytes Absolute 0.9 0.1 - 1.0 K/uL   Eosinophils Relative 0 %   Eosinophils Absolute 0.0 0.0  - 0.5 K/uL   Basophils Relative 0 %   Basophils Absolute 0.0 0.0 - 0.1 K/uL   Immature Granulocytes 0 %   Abs Immature Granulocytes 0.02 0.00 - 0.07 K/uL    Comment: Performed at [facility], [place] [address], [place], [place] [address]  Procalcitonin - Baseline  Status: None   Collection Time: 04/18/21 12:52 AM  Result Value Ref Range   Procalcitonin 1.44 ng/mL    Comment:        Interpretation: PCT > 0.5 ng/mL and <= 2 ng/mL: Systemic infection (sepsis) is possible, but other conditions are known to elevate PCT as well. (NOTE)       Sepsis PCT Algorithm           Lower Respiratory Tract                                      Infection PCT Algorithm    ----------------------------     ----------------------------         PCT < 0.25 ng/mL                PCT < 0.10 ng/mL          Strongly encourage             Strongly discourage   discontinuation of antibiotics    initiation of antibiotics    ----------------------------     -----------------------------       PCT 0.25 - 0.50 ng/mL            PCT 0.10 - 0.25 ng/mL               OR       >80% decrease in PCT            Discourage initiation of                                            antibiotics      Encourage discontinuation           of antibiotics    ----------------------------     -----------------------------         PCT >= 0.50 ng/mL              PCT 0.26 - 0.50 ng/mL                AND       <80% decrease in PCT             Encourage initiation of                                             antibiotics       Encourage continuation           of antibiotics    ----------------------------     -----------------------------        PCT >= 0.50 ng/mL                  PCT > 0.50 ng/mL               AND         increase in PCT                  Strongly encourage                                      initiation  of antibiotics    Strongly encourage escalation           of antibiotics                                      -----------------------------                                           PCT <= 0.25 ng/mL                                                 OR                                        > 80% decrease in PCT                                      Discontinue / Do not initiate                                             antibiotics  Performed at [facility], [address] [place], [place] [address]   Brain natriuretic peptide     Status: Abnormal   Collection Time: 04/18/21  7:29 AM  Result Value Ref Range   B Natriuretic Peptide 315.9 (H) 0.0 - 100.0 pg/mL    Comment: Performed at [facility] [address] [place], [place] [address]  Glucose, capillary     Status: Abnormal   Collection Time: 04/18/21  8:12 AM  Result Value Ref Range   Glucose-Capillary 294 (H) 70 - 99 mg/dL    Comment: Glucose reference range applies only to samples taken after fasting for at least 8 hours.  Glucose, capillary     Status: Abnormal   Collection Time: 04/18/21 11:44 AM  Result Value Ref Range   Glucose-Capillary 259 (H) 70 - 99 mg/dL    Comment: Glucose reference range applies only to samples taken after fasting for at least 8 hours.  Glucose, capillary     Status: Abnormal   Collection Time: 04/18/21  5:53 PM  Result Value Ref Range   Glucose-Capillary 155 (H) 70 - 99 mg/dL    Comment: Glucose reference range applies only to samples taken after fasting for at least 8 hours.  Glucose, capillary     Status: Abnormal   Collection Time: 04/18/21  8:09 PM  Result Value Ref Range   Glucose-Capillary 142 (H) 70 - 99 mg/dL    Comment: Glucose reference range applies only to samples taken after fasting for at least 8 hours.  Magnesium     Status: None   Collection Time: 04/19/21  1:17 AM  Result Value Ref Range   Magnesium 2.1 1.7 - 2.4 mg/dL    Comment: Performed at [facility] [address] [place], [place] [address]  Comprehensive metabolic panel  Status: Abnormal    Collection Time: 04/19/21  1:17 AM  Result Value Ref Range   Sodium 136 135 - 145 mmol/L   Potassium 3.0 (L) 3.5 - 5.1 mmol/L   Chloride 103 98 - 111 mmol/L   CO2 23 22 - 32 mmol/L   Glucose, Bld 88 70 - 99 mg/dL    Comment: Glucose reference range applies only to samples taken after fasting for at least 8 hours.   BUN 26 (H) 8 - 23 mg/dL   Creatinine, Ser 1.33 (H) 0.61 - 1.24 mg/dL   Calcium 7.8 (L) 8.9 - 10.3 mg/dL   Total Protein 5.1 (L) 6.5 - 8.1 g/dL   Albumin 2.4 (L) 3.5 - 5.0 g/dL   AST 24 15 - 41 U/L   ALT 18 0 - 44 U/L   Alkaline Phosphatase 60 38 - 126 U/L   Total Bilirubin 1.2 0.3 - 1.2 mg/dL   GFR, Estimated 59 (L) >60 mL/min    Comment: (NOTE) Calculated using the CKD-EPI Creatinine Equation (2021)    Anion gap 10 5 - 15    Comment: Performed at [facility], [place] [address], [place], [place] [address]  CBC with Differential/Platelet     Status: Abnormal   Collection Time: 04/19/21  1:17 AM  Result Value Ref Range   WBC 8.5 4.0 - 10.5 K/uL   RBC 3.85 (L) 4.22 - 5.81 MIL/uL   Hemoglobin 12.7 (L) 13.0 - 17.0 g/dL   HCT 34.9 (L) 39.0 - 52.0 %   MCV 90.6 80.0 - 100.0 fL   MCH 33.0 26.0 - 34.0 pg   MCHC 36.4 (H) 30.0 - 36.0 g/dL   RDW 12.6 11.5 - 15.5 %   Platelets 101 (L) 150 - 400 K/uL    Comment: Immature Platelet Fraction may be clinically indicated, consider ordering this additional test [id number] REPEATED TO VERIFY PLATELET COUNT CONFIRMED BY SMEAR PLATELET CLUMPS NOTED ON SMEAR, COUNT APPEARS ADEQUATE    nRBC 0.0 0.0 - 0.2 %   Neutrophils Relative % 69 %   Neutro Abs 5.9 1.7 - 7.7 K/uL   Lymphocytes Relative 21 %   Lymphs Abs 1.8 0.7 - 4.0 K/uL   Monocytes Relative 9 %   Monocytes Absolute 0.8 0.1 - 1.0 K/uL   Eosinophils Relative 0 %   Eosinophils Absolute 0.0 0.0 - 0.5 K/uL   Basophils Relative 0 %   Basophils Absolute 0.0 0.0 - 0.1 K/uL   WBC Morphology MORPHOLOGY UNREMARKABLE    RBC Morphology MORPHOLOGY UNREMARKABLE    Smear Review  PLATELET COUNT CONFIRMED BY SMEAR    Immature Granulocytes 1 %   Abs Immature Granulocytes 0.04 0.00 - 0.07 K/uL    Comment: Performed at [facility], [place] [address] [place], [place] [address]  Procalcitonin     Status: None   Collection Time: 04/19/21  1:17 AM  Result Value Ref Range   Procalcitonin 0.86 ng/mL    Comment:        Interpretation: PCT > 0.5 ng/mL and <= 2 ng/mL: Systemic infection (sepsis) is possible, but other conditions are known to elevate PCT as well. (NOTE)       Sepsis PCT Algorithm           Lower Respiratory Tract  Infection PCT Algorithm    ----------------------------     ----------------------------         PCT < 0.25 ng/mL                PCT < 0.10 ng/mL          Strongly encourage             Strongly discourage   discontinuation of antibiotics    initiation of antibiotics    ----------------------------     -----------------------------       PCT 0.25 - 0.50 ng/mL            PCT 0.10 - 0.25 ng/mL               OR       >80% decrease in PCT            Discourage initiation of                                            antibiotics      Encourage discontinuation           of antibiotics    ----------------------------     -----------------------------         PCT >= 0.50 ng/mL              PCT 0.26 - 0.50 ng/mL                AND       <80% decrease in PCT             Encourage initiation of                                             antibiotics       Encourage continuation           of antibiotics    ----------------------------     -----------------------------        PCT >= 0.50 ng/mL                  PCT > 0.50 ng/mL               AND         increase in PCT                  Strongly encourage                                      initiation of antibiotics    Strongly encourage escalation           of antibiotics                                     -----------------------------                                            PCT <= 0.25 ng/mL  OR                                        > 80% decrease in PCT                                      Discontinue / Do not initiate                                             antibiotics  Performed at [facility], [place] [address] [place], [place] [address]   Lipase, blood     Status: Abnormal   Collection Time: 04/19/21  1:17 AM  Result Value Ref Range   Lipase 85 (H) 11 - 51 U/L    Comment: Performed at [facility] [address], [place], [place] [address]  Glucose, capillary     Status: Abnormal   Collection Time: 04/19/21  4:04 AM  Result Value Ref Range   Glucose-Capillary 101 (H) 70 - 99 mg/dL    Comment: Glucose reference range applies only to samples taken after fasting for at least 8 hours.  Glucose, capillary     Status: Abnormal   Collection Time: 04/19/21  8:13 AM  Result Value Ref Range   Glucose-Capillary 132 (H) 70 - 99 mg/dL    Comment: Glucose reference range applies only to samples taken after fasting for at least 8 hours.  Glucose, capillary     Status: Abnormal   Collection Time: 04/19/21 12:39 PM  Result Value Ref Range   Glucose-Capillary 117 (H) 70 - 99 mg/dL    Comment: Glucose reference range applies only to samples taken after fasting for at least 8 hours.    CT ABDOMEN PELVIS WO CONTRAST  Result Date: 04/19/2021 CLINICAL DATA:  65 year old male with acute abdominal and pelvic pain. EXAM: CT ABDOMEN AND PELVIS WITHOUT CONTRAST TECHNIQUE: Multidetector CT imaging of the abdomen and pelvis was performed following the standard protocol without IV contrast. COMPARISON:  05/01/2008 CT FINDINGS: Unenhanced CT was performed per clinician order. Lack of IV contrast limits sensitivity and specificity, especially for evaluation of vascular structures and abdominal/pelvic solid viscera. Lower chest: Mild bilateral LOWER lobe cylindrical bronchiectasis noted with  minimal tree in bud opacities in the LEFT LOWER lobe. Coronary artery atherosclerotic calcifications are present. Hepatobiliary: No hepatic or gallbladder abnormalities are identified. No biliary dilatation noted. Pancreas: Unremarkable Spleen: Unremarkable Adrenals/Urinary Tract: The kidneys, adrenal glands and bladder are unremarkable. Stomach/Bowel: Mildly distended loops of fluid-filled small bowel are noted without transition point. There is mild circumferential wall thickening of the distal ileum to the cecum with adjacent inflammation and small amount of free fluid. No other areas of bowel wall thickening are noted. No evidence of pneumoperitoneum, pneumatosis intestinalis or portal venous gas. The appendix appears normal. Vascular/Lymphatic: Heavy arterial vascular calcifications are noted including aortic atherosclerosis. Heavy SMA and IMA calcifications are identified. No enlarged lymph nodes are identified. Reproductive: Prostate is unremarkable. Other: Small amount of free fluid is noted within the pelvis, RIGHT LOWER quadrant and adjacent to the liver and spleen. Musculoskeletal: No acute or suspicious bony abnormalities are noted. IMPRESSION: 1. Mild circumferential wall thickening of the distal ileum  with adjacent inflammation and small amount of free fluid. This is nonspecific but may represent an infectious or inflammatory enteritis. Given very heavy mesenteric arterial calcifications, ischemia is not entirely excluded but no evidence of pneumatosis intestinalis or pneumoperitoneum. Mildly distended loops of fluid-filled small bowel proximally without transition point, which may represent a mild ileus or low-grade partial obstruction at the distal ileum. 2. Mild bilateral LOWER lobe cylindrical bronchiectasis with minimal tree in bud opacities in the LEFT LOWER lobe, likely representing a mild infectious or inflammatory process. 3. Coronary artery disease and aortic Atherosclerosis (ICD10-I70.0).  Electronically Signed   By: Margarette Canada M.D.   On: 04/19/2021 11:00   DG Chest Port 1 View  Result Date: 04/18/2021 CLINICAL DATA:  Shortness of breath. EXAM: PORTABLE CHEST 1 VIEW COMPARISON:  04/16/2021 FINDINGS: The heart size and mediastinal contours are within normal limits. Both lungs are clear. The visualized skeletal structures are unremarkable. IMPRESSION: No active disease. Electronically Signed   By: Kerby Moors M.D.   On: 04/18/2021 08:21    Review of Systems  Constitutional: Negative.   HENT: Negative.    Eyes: Negative.   Respiratory: Negative.    Cardiovascular: Negative.   Gastrointestinal:  Positive for abdominal pain and diarrhea.  Endocrine: Negative.   Neurological: Negative.   Blood pressure 113/83, pulse 91, temperature 97.7 F (36.5 C), temperature source Oral, resp. rate 18, height 6' (1.829 m), weight 81.6 kg, SpO2 99 %. Physical Exam Constitutional:      Appearance: He is well-developed.     Comments: Conversant No acute distress  HENT:     Head: Normocephalic and atraumatic.  Eyes:     General: Lids are normal. No scleral icterus.    Pupils: Pupils are equal, round, and reactive to light.     Comments: Pupils are equal round and reactive No lid lag Moist conjunctiva  Neck:     Thyroid: No thyromegaly.     Trachea: No tracheal tenderness.     Comments: No cervical lymphadenopathy Cardiovascular:     Rate and Rhythm: Normal rate and regular rhythm.     Heart sounds: No murmur heard. Pulmonary:     Effort: Pulmonary effort is normal.     Breath sounds: Normal breath sounds. No wheezing or rales.  Abdominal:     General: There is no distension.     Tenderness: There is abdominal tenderness (BLQ). There is no guarding or rebound.     Hernia: No hernia is present.  Musculoskeletal:     Cervical back: Normal range of motion and neck supple.  Skin:    General: Skin is warm.     Findings: No rash.     Nails: There is no clubbing.     Comments:  Normal skin turgor  Neurological:     Mental Status: He is alert and oriented to person, place, and time.     Comments: Normal gait and station  Psychiatric:        Mood and Affect: Mood normal.        Thought Content: Thought content normal.        Judgment: Judgment normal.     Comments: Appropriate affect    Assessment/Plan: 66 year old male history of diabetes, admitted for DKA, AKI, and development of abdominal pain.  Patient does have history of A. fib on Eliquis.  Patient does have some abdominal pain bilateral lower quadrants.  There is no rebound or guarding currently. Will await CTA of his abdomen pelvis.  Patient was seen and examined with Dr. Doren Custard who is involved in his case as well.  There is concern for ischemia patient would likely benefit from laparotomy.  Will continue to follow.  [facility] decision making  [name] 04/19/2021, 4:57 PM

## 2021-04-19 NOTE — Consult Note (Signed)
ASSESSMENT & PLAN   ABDOMINAL PAIN: Based on his history and CT angiogram findings I do not think he has any evidence of acute mesenteric ischemia.  Likewise based on his history he has no evidence of chronic mesenteric ischemia.  His CT scan shows wall thickening at the base of the cecum and terminal ileum consistent with possible infectious or inflammatory terminal ileitis.  General surgery is following.  I will follow peripherally but no further vascular work-up is indicated at this time.  REASON FOR CONSULT:    Possible intestinal ischemia.  The consult is requested by Dr. Candiss Norse.   HPI:   [name] is a 66 y.o. male who was admitted on 04/16/2021 with diabetic ketoacidosis.  He was actually being considered for discharge today however he developed significant abdominal pain.  This prompted a CT scan that was done without contrast.  This showed some wall thickening of the distal ileum with some adjacent inflammation and a small amount of free fluid.  It was felt that this could represent an infectious or inflammatory enteritis.  Given his vascular history was felt that he could potentially have ischemic bowel and for this reason vascular surgery was consulted.  On my history, the patient developed the fairly sudden onset of abdominal pain yesterday morning.  This has been fairly persistent.  It got worse today.  After his CAT scan he had a large bowel movement and felt better briefly but then started to have recurrent pain.  The pain moved from his upper abdomen to his lower abdomen.  He also describes diarrhea which is Jha.  Prior to this admission, he denied any history of abdominal pain, postprandial abdominal pain, or weight loss.  Of note the patient is on Eliquis for atrial fibrillation and the best I can tell this was never stopped during the admission.  His risk factors for peripheral vascular disease include type 2 diabetes, hypertension, a family history of premature  cardiovascular disease, and tobacco use.  He smoked 2 pack/day of cigarettes up until admission.  The patient has had previous bilateral carotid endarterectomies.  He had an asymptomatic severe carotid disease bilaterally and underwent a left carotid endarterectomy in January 2022 and then subsequently a right carotid endarterectomy in February of that year.  He has a history of coronary artery disease and has undergone previous PTCA.  This was in 2017 by Dr. Einar Gip.   Past Medical History:  Diagnosis Date   AKI (acute kidney injury) ([place]) 12/2015   Anxiety    Arthritis    Carotid artery stenosis    Coronary artery disease    Dementia (HCC)    problems with short term memory   Depression    DKA (diabetic ketoacidosis) ([place]) 04/16/2021   Dysrhythmia    episode of afib 12/2005 in setting of cocaine use   Gout    Hyperlipidemia    Hypertension    Myocardial infarction [facility])    2017   PAD (peripheral artery disease) (HCC)    Stroke (HCC)    TIA   TIA (transient ischemic attack)    over 20 years ago    Family History  Problem Relation Age of Onset   Heart attack Mother    Heart attack Father    IgA nephropathy Son     SOCIAL HISTORY: Social History   Tobacco Use   Smoking status: Every Day    Packs/day: 1.00    Years: 21.00    Pack years: 21.00  Types: Cigarettes   Smokeless tobacco: Never  Substance Use Topics   Alcohol use: Not Currently    Comment: quit drinking 9 months ago    Allergies  Allergen Reactions   Vicodin [Hydrocodone-Acetaminophen] Nausea And Vomiting    Current Facility-Administered Medications  Medication Dose Route Frequency Provider Last Rate Last Admin   allopurinol (ZYLOPRIM) tablet 100 mg  100 mg Oral Daily Jennette Kettle M, DO   100 mg at 04/19/21 6734   apixaban (ELIQUIS) tablet 5 mg  5 mg Oral BID Etta Quill, DO   5 mg at 04/19/21 1937   atorvastatin (LIPITOR) tablet 80 mg  80 mg Oral QHS Etta Quill, DO   80 mg at 04/18/21  2159   busPIRone (BUSPAR) tablet 5 mg  5 mg Oral BID PRN Etta Quill, DO   5 mg at 04/17/21 2142   dextrose 5 % and 0.45 % NaCl with KCl 10 mEq/L infusion   Intravenous Continuous Thurnell Lose, MD       dextrose 50 % solution 0-50 mL  0-50 mL Intravenous PRN Etta Quill, DO       escitalopram (LEXAPRO) tablet 20 mg  20 mg Oral Daily Jennette Kettle M, DO   20 mg at 04/19/21 9024   hydrALAZINE (APRESOLINE) injection 10 mg  10 mg Intravenous Q6H PRN Thurnell Lose, MD       HYDROcodone-acetaminophen (NORCO/VICODIN) 5-325 MG per tablet 1 tablet  1 tablet Oral Q4H PRN Thurnell Lose, MD   1 tablet at 04/19/21 1652   hydrOXYzine (ATARAX) tablet 25 mg  25 mg Oral QHS PRN Etta Quill, DO       icosapent Ethyl (VASCEPA) 1 g capsule 2 g  2 g Oral BID Jennette Kettle M, DO   2 g at 04/19/21 0810   insulin aspart (novoLOG) injection 0-9 Units  0-9 Units Subcutaneous Q4H Thurnell Lose, MD       insulin starter kit- pen needles (English) 1 kit  1 kit Other Once Thurnell Lose, MD       living well with diabetes book MISC   Does not apply Once Thurnell Lose, MD       metoprolol tartrate (LOPRESSOR) injection 5 mg  5 mg Intravenous Q8H PRN Thurnell Lose, MD   5 mg at 04/19/21 0800   metoprolol tartrate (LOPRESSOR) tablet 25 mg  25 mg Oral BID Thurnell Lose, MD       morphine 2 MG/ML injection 1 mg  1 mg Intravenous Q4H PRN Thurnell Lose, MD       ondansetron (ZOFRAN) injection 4 mg  4 mg Intravenous Q6H PRN Thurnell Lose, MD       pantoprazole (PROTONIX) EC tablet 40 mg  40 mg Oral Daily Thurnell Lose, MD   40 mg at 04/19/21 0811   piperacillin-tazobactam (ZOSYN) IVPB 3.375 g  3.375 g Intravenous Once Kris Mouton, [facility]       [START ON 04/20/2021] piperacillin-tazobactam (ZOSYN) IVPB 3.375 g  3.375 g Intravenous Q8H Kris Mouton, RPH       traZODone (DESYREL) tablet 100 mg  100 mg Oral QHS Jennette Kettle M, DO   100 mg at 04/18/21 2159    REVIEW OF  SYSTEMS:  '[X]'  denotes positive finding, '[ ]'  denotes negative finding Cardiac  Comments:  Chest pain or chest pressure:    Shortness of breath upon exertion:    Short of breath  when lying flat:    Irregular heart rhythm: x       Vascular    Pain in calf, thigh, or hip brought on by ambulation:    Pain in feet at night that wakes you up from your sleep:     Blood clot in your veins:    Leg swelling:         Pulmonary    Oxygen at home:    Productive cough:     Wheezing:         Neurologic    Sudden weakness in arms or legs:     Sudden numbness in arms or legs:     Sudden onset of difficulty speaking or slurred speech:    Temporary loss of vision in one eye:     Problems with dizziness:         Gastrointestinal    Blood in stool:  x Yeager stool  Vomited blood:         Genitourinary    Burning when urinating:     Blood in urine:        Psychiatric    Major depression:         Hematologic    Bleeding problems:    Problems with blood clotting too easily:        Skin    Rashes or ulcers:        Constitutional    Fever or chills:    -  PHYSICAL EXAM:   Vitals:   04/19/21 0755 04/19/21 0800 04/19/21 1226 04/19/21 1615  BP:   95/68 113/83  Pulse: (!) 117  86 91  Resp:  '20 20 18  ' Temp:  99.2 F (37.3 C) 97.6 F (36.4 C) 97.7 F (36.5 C)  TempSrc:  Oral Oral Oral  SpO2:   96% 99%  Weight:      Height:       Body mass index is 24.41 kg/m. GENERAL: The patient is a well-nourished male, in no acute distress. The vital signs are documented above. CARDIAC: There is a regular rate and rhythm.  VASCULAR: I do not detect carotid bruits. On the right side he has a palpable femoral, popliteal, and dorsalis pedis pulse although slightly diminished.  He has brisk Doppler signals in the dorsalis pedis and posterior tibial positions. On the left side he has a palpable femoral pulse and weakly palpable dorsalis pedis pulse.  He has brisk Doppler signals in the dorsalis  pedis and posterior tibial positions on the left. He has no significant lower extremity swelling. PULMONARY: There is good air exchange bilaterally without wheezing or rales. ABDOMEN: His abdomen is soft and slightly distended.  He has hypoactive bowel sounds.  He has tenderness in the right and left lower quadrants.  Currently no significant guarding. MUSCULOSKELETAL: There are no major deformities. NEUROLOGIC: No focal weakness or paresthesias are detected. SKIN: There are no ulcers or rashes noted. PSYCHIATRIC: The patient has a normal affect.  DATA:    CT ABDOMEN PELVIS WITHOUT CONTRAST: His CT scan without contrast shows some wall thickening in the distal ileum with adjacent inflammation and a small amount of free fluid.  Radiology felt that this could potentially represent an infectious or inflammatory enteritis.  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  The calcific disease extends quite far distally on the SMA.  He also has significant disease of his infrarenal aorta, and external  iliac arteries.  CT ANGIO ABDOMEN PELVIS: I have reviewed his CT angiogram of the abdomen pelvis.  This celiac axis, superior mesenteric artery, and IMA are patent.  There is no evidence of acute arterial occlusion.  He does have significant calcific disease is noted on his noncontrast CT.   Deitra Mayo [facility] : [facility]

## 2021-04-19 NOTE — Anesthesia Procedure Notes (Signed)
Procedure Name: Intubation Date/Time: 04/19/2021 9:19 PM Performed by: Trinna Post., CRNA Pre-anesthesia Checklist: Patient identified, Emergency Drugs available, Suction available, Patient being monitored and Timeout performed Patient Re-evaluated:Patient Re-evaluated prior to induction Oxygen Delivery Method: Circle system utilized Preoxygenation: Pre-oxygenation with 100% oxygen Induction Type: IV induction Ventilation: Mask ventilation without difficulty Laryngoscope Size: Mac and 4 Grade View: Grade I Tube type: Oral Tube size: 7.5 mm Number of attempts: 1 Airway Equipment and Method: Stylet Placement Confirmation: ETT inserted through vocal cords under direct vision, positive ETCO2 and breath sounds checked- equal and bilateral Secured at: 22 cm Tube secured with: Tape Dental Injury: Teeth and Oropharynx as per pre-operative assessment

## 2021-04-20 ENCOUNTER — Encounter (HOSPITAL_COMMUNITY): Payer: Self-pay | Admitting: Surgery

## 2021-04-20 DIAGNOSIS — K559 Vascular disorder of intestine, unspecified: Secondary | ICD-10-CM

## 2021-04-20 LAB — MAGNESIUM: Magnesium: 1.7 mg/dL (ref 1.7–2.4)

## 2021-04-20 LAB — CBC
HCT: 37.2 % — ABNORMAL LOW (ref 39.0–52.0)
Hemoglobin: 13 g/dL (ref 13.0–17.0)
MCH: 32.8 pg (ref 26.0–34.0)
MCHC: 34.9 g/dL (ref 30.0–36.0)
MCV: 93.9 fL (ref 80.0–100.0)
Platelets: 96 10*3/uL — ABNORMAL LOW (ref 150–400)
RBC: 3.96 MIL/uL — ABNORMAL LOW (ref 4.22–5.81)
RDW: 13 % (ref 11.5–15.5)
WBC: 4.1 10*3/uL (ref 4.0–10.5)
nRBC: 0 % (ref 0.0–0.2)

## 2021-04-20 LAB — HEMOGLOBIN A1C
Hgb A1c MFr Bld: >15.5 % — ABNORMAL HIGH (ref 4.8–5.6)
Hgb A1c MFr Bld: >15.5 % — ABNORMAL HIGH (ref 4.8–5.6)
Hgb A1c MFr Bld: >15.5 % — ABNORMAL HIGH (ref 4.8–5.6)
Hgb A1c MFr Bld: >15.5 % — ABNORMAL HIGH (ref 4.8–5.6)
Mean Plasma Glucose: >398 mg/dL
Mean Plasma Glucose: >398 mg/dL
Mean Plasma Glucose: >398 mg/dL
Mean Plasma Glucose: >398 mg/dL

## 2021-04-20 LAB — COMPREHENSIVE METABOLIC PANEL
ALT: 22 U/L (ref 0–44)
AST: 28 U/L (ref 15–41)
Albumin: 2.4 g/dL — ABNORMAL LOW (ref 3.5–5.0)
Alkaline Phosphatase: 51 U/L (ref 38–126)
Anion gap: 15 (ref 5–15)
BUN: 23 mg/dL (ref 8–23)
CO2: 16 mmol/L — ABNORMAL LOW (ref 22–32)
Calcium: 7 mg/dL — ABNORMAL LOW (ref 8.9–10.3)
Chloride: 105 mmol/L (ref 98–111)
Creatinine, Ser: 1.42 mg/dL — ABNORMAL HIGH (ref 0.61–1.24)
GFR, Estimated: 54 mL/min — ABNORMAL LOW (ref >60)
Glucose, Bld: 148 mg/dL — ABNORMAL HIGH (ref 70–99)
Potassium: 3.5 mmol/L (ref 3.5–5.1)
Sodium: 136 mmol/L (ref 135–145)
Total Bilirubin: 1 mg/dL (ref 0.3–1.2)
Total Protein: 5.2 g/dL — ABNORMAL LOW (ref 6.5–8.1)

## 2021-04-20 LAB — APTT: aPTT: 39 seconds — ABNORMAL HIGH (ref 24–36)

## 2021-04-20 LAB — GLUCOSE, CAPILLARY
Glucose-Capillary: 182 mg/dL — ABNORMAL HIGH (ref 70–99)
Glucose-Capillary: 211 mg/dL — ABNORMAL HIGH (ref 70–99)
Glucose-Capillary: 211 mg/dL — ABNORMAL HIGH (ref 70–99)
Glucose-Capillary: 123 mg/dL — ABNORMAL HIGH (ref 70–99)
Glucose-Capillary: 110 mg/dL — ABNORMAL HIGH (ref 70–99)

## 2021-04-20 LAB — C-REACTIVE PROTEIN: CRP: 19.5 mg/dL — ABNORMAL HIGH (ref <1.0)

## 2021-04-20 LAB — D-DIMER, QUANTITATIVE: D-Dimer, Quant: 7.14 ug/mL-FEU — ABNORMAL HIGH (ref 0.00–0.50)

## 2021-04-20 LAB — HEPARIN LEVEL (UNFRACTIONATED): Heparin Unfractionated: 0.69 IU/mL (ref 0.30–0.70)

## 2021-04-20 LAB — PROCALCITONIN: Procalcitonin: 0.57 ng/mL

## 2021-04-20 MED ORDER — POTASSIUM CHLORIDE 2 MEQ/ML IV SOLN
INTRAVENOUS | Status: DC
  Filled 2021-04-20 (×8): qty 1000

## 2021-04-20 MED ORDER — ENOXAPARIN SODIUM 40 MG/0.4ML IJ SOSY: 40.0000 mg | PREFILLED_SYRINGE | INTRAMUSCULAR | Status: DC

## 2021-04-20 MED ORDER — HEPARIN (PORCINE) 25000 UT/250ML-% IV SOLN
1250.0000 [IU]/h | INTRAVENOUS | Status: DC
  Administered 2021-04-20: 12:00:00 1200 [IU]/h via INTRAVENOUS
  Administered 2021-04-21 – 2021-04-26 (×7): 1350 [IU]/h via INTRAVENOUS
  Administered 2021-04-27: 1300 [IU]/h via INTRAVENOUS
  Administered 2021-04-27 – 2021-04-28 (×2): 1250 [IU]/h via INTRAVENOUS
  Filled 2021-04-20 (×11): qty 250

## 2021-04-20 MED ORDER — MAGNESIUM SULFATE IN D5W 1-5 GM/100ML-% IV SOLN
1.0000 g | Freq: Once | INTRAVENOUS | Status: AC
  Administered 2021-04-20: 1 g via INTRAVENOUS
  Filled 2021-04-20: qty 100

## 2021-04-20 MED ORDER — INSULIN GLARGINE-YFGN 100 UNIT/ML ~~LOC~~ SOLN: 12.0000 [IU] | Freq: Every day | SUBCUTANEOUS | Status: DC

## 2021-04-20 MED ORDER — INSULIN GLARGINE-YFGN 100 UNIT/ML ~~LOC~~ SOLN
8.0000 [IU] | Freq: Every day | SUBCUTANEOUS | Status: DC
  Administered 2021-04-20 – 2021-04-23 (×4): 8 [IU] via SUBCUTANEOUS
  Filled 2021-04-20 (×6): qty 0.08

## 2021-04-20 MED ORDER — INSULIN ASPART 100 UNIT/ML IJ SOLN
0.0000 [IU] | Freq: Four times a day (QID) | INTRAMUSCULAR | Status: DC
  Administered 2021-04-20: 3 [IU] via SUBCUTANEOUS
  Administered 2021-04-21 (×2): 2 [IU] via SUBCUTANEOUS
  Administered 2021-04-22: 09:00:00 1 [IU] via SUBCUTANEOUS
  Administered 2021-04-22 – 2021-04-23 (×3): 2 [IU] via SUBCUTANEOUS
  Administered 2021-04-23 (×2): 3 [IU] via SUBCUTANEOUS
  Administered 2021-04-24: 2 [IU] via SUBCUTANEOUS
  Administered 2021-04-24: 3 [IU] via SUBCUTANEOUS
  Administered 2021-04-24: 1 [IU] via SUBCUTANEOUS
  Administered 2021-04-24: 2 [IU] via SUBCUTANEOUS
  Administered 2021-04-25: 3 [IU] via SUBCUTANEOUS

## 2021-04-20 MED ORDER — POTASSIUM CHLORIDE 2 MEQ/ML IV SOLN: INTRAVENOUS | Status: DC

## 2021-04-20 MED ORDER — PIPERACILLIN-TAZOBACTAM 3.375 G IVPB
3.3750 g | Freq: Three times a day (TID) | INTRAVENOUS | Status: AC
  Administered 2021-04-20 – 2021-04-23 (×12): 3.375 g via INTRAVENOUS
  Filled 2021-04-20 (×13): qty 50

## 2021-04-20 MED ORDER — INSULIN GLARGINE-YFGN 100 UNIT/ML ~~LOC~~ SOLN
10.0000 [IU] | Freq: Every day | SUBCUTANEOUS | Status: DC
Start: 2021-04-20 — End: 2021-04-20

## 2021-04-20 MED ORDER — CHLORHEXIDINE GLUCONATE CLOTH 2 % EX PADS
6.0000 | MEDICATED_PAD | Freq: Every day | CUTANEOUS | Status: DC
  Administered 2021-04-20 – 2021-05-03 (×12): 6 via TOPICAL

## 2021-04-20 NOTE — Care Management Important Message (Signed)
Important Message  Patient Details  Name: [name] MRN: [id number] Date of Birth: [birth year]   Medicare Important Message Given:  Yes     Lareta Bruneau Montine Circle 04/20/2021, 3:42 PM

## 2021-04-20 NOTE — Anesthesia Postprocedure Evaluation (Signed)
Anesthesia Post Note  Patient: [name]  Procedure(s) Performed: LAPAROSCOPY DIAGNOSTIC EXPLORATORY LAPAROTOMY, ILEOCECECTOMY     Patient location during evaluation: PACU Anesthesia Type: General Level of consciousness: awake and alert Pain management: pain level controlled Vital Signs Assessment: post-procedure vital signs reviewed and stable Respiratory status: spontaneous breathing, nonlabored ventilation, respiratory function stable and patient connected to nasal cannula oxygen Cardiovascular status: blood pressure returned to baseline and stable Postop Assessment: no apparent nausea or vomiting Anesthetic complications: no   No notable events documented.  Last Vitals:  Vitals:   04/20/21 0400 04/20/21 0500  BP: (!) 143/84 (!) 152/79  Pulse: 99   Resp: 20   Temp: 36.7 C   SpO2: 98%     Last Pain:  Vitals:   04/20/21 0400  TempSrc: Oral  PainSc:                  March Rummage Dyesha Henault

## 2021-04-20 NOTE — Progress Notes (Signed)
Physical Therapy Treatment Patient Details Name: [name] MRN: [id number] DOB: June 24, 1955 Today's Date: 04/20/2021   History of Present Illness Pt is a 66 y/o male admitted 1/19 with 5-7 day hx of weakness, increased urination and tremors.  Found with new onset DM type II with DKA and severe dehydration. 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 PMH inculdes: anxiety, AKI, CAD, dementia, DKA, CVA, TIA.    PT Comments    Pt making excellent progress towards his physical therapy goals, demonstrating improved activity tolerance and ambulation distance. Pt requiring min assist for transfers and ambulating 150 feet with a walker at a min guard assist level. SpO2 > 90% on RA, HR 80's. Suspect pt will make continued steady progress. Updated d/c plan.   Recommendations for follow up therapy are one component of a multi-disciplinary discharge planning process, led by the attending physician.  Recommendations may be updated based on patient status, additional functional criteria and insurance authorization.  Follow Up Recommendations  Home health PT     Assistance Recommended at Discharge Frequent or constant Supervision/Assistance  Patient can return home with the following A little help with walking and/or transfers;A little help with bathing/dressing/bathroom   Equipment Recommendations  None recommended by PT    Recommendations for Other Services       Precautions / Restrictions Precautions Precautions: Fall;Other (comment) Precaution Comments: abdominal incision Restrictions Weight Bearing Restrictions: No     Mobility  Bed Mobility Overal bed mobility: Needs Assistance Bed Mobility: Rolling, Sidelying to Sit Rolling: Supervision Sidelying to sit: Supervision       General bed mobility comments: cues for log roll technique, no physical assist required    Transfers Overall transfer level: Needs assistance Equipment used: Rolling walker (2  wheels) Transfers: Sit to/from Stand Sit to Stand: Min assist           General transfer comment: MinA to rise from edge of bed    Ambulation/Gait Ambulation/Gait assistance: Min guard Gait Distance (Feet): 150 Feet Assistive device: Rolling walker (2 wheels) Gait Pattern/deviations: Step-through pattern, Decreased stride length Gait velocity: decreased     General Gait Details: slow and steady pace, min guard for safety   Stairs             Wheelchair Mobility    Modified Rankin (Stroke Patients Only)       Balance Overall balance assessment: Needs assistance Sitting-balance support: No upper extremity supported, Feet supported Sitting balance-Leahy Scale: Good     Standing balance support: Bilateral upper extremity supported Standing balance-Leahy Scale: Fair                              Cognition Arousal/Alertness: Awake/alert Behavior During Therapy: WFL for tasks assessed/performed Overall Cognitive Status: Within Functional Limits for tasks assessed                                 General Comments: WFL for basic mobility        Exercises      General Comments        Pertinent Vitals/Pain Pain Assessment Pain Assessment: Faces Faces Pain Scale: Hurts little more Pain Location: abdomen Pain Descriptors / Indicators: Discomfort, Grimacing, Guarding Pain Intervention(s): Limited activity within patient's tolerance, Monitored during session, Patient requesting pain meds-RN notified    Home Living  Prior Function            PT Goals (current goals can now be found in the care plan section) Acute Rehab PT Goals Patient Stated Goal: go home Potential to Achieve Goals: Good Progress towards PT goals: Progressing toward goals    Frequency    Min 3X/week      PT Plan Discharge plan needs to be updated    Co-evaluation              AM-PAC PT "6 Clicks" Mobility    Outcome Measure  Help needed turning from your back to your side while in a flat bed without using bedrails?: A Little Help needed moving from lying on your back to sitting on the side of a flat bed without using bedrails?: A Little Help needed moving to and from a bed to a chair (including a wheelchair)?: A Little Help needed standing up from a chair using your arms (e.g., wheelchair or bedside chair)?: A Little Help needed to walk in hospital room?: A Little Help needed climbing 3-5 steps with a railing? : A Lot 6 Click Score: 17    End of Session Equipment Utilized During Treatment: Gait belt Activity Tolerance: Patient tolerated treatment well Patient left: in chair;with call bell/phone within reach;with chair alarm set Nurse Communication: Mobility status PT Visit Diagnosis: Unsteadiness on feet (R26.81);Muscle weakness (generalized) (M62.81);Difficulty in walking, not elsewhere classified (R26.2)     Time: 9179-1505 PT Time Calculation (min) (ACUTE ONLY): 22 min  Charges:  $Therapeutic Activity: 8-22 mins                     Wyona Almas, PT, DPT Acute Rehabilitation Services Pager [phone] Office [phone]    Deno Etienne 04/20/2021, 3:17 PM

## 2021-04-20 NOTE — Progress Notes (Signed)
ANTICOAGULATION CONSULT NOTE - Initial Consult  Pharmacy Consult for Lovenox Indication: VTE prophylaxis  Allergies  Allergen Reactions   Vicodin [Hydrocodone-Acetaminophen] Nausea And Vomiting    Patient Measurements: Height: 6' (182.9 cm) Weight: 81.6 kg (180 lb) IBW/kg (Calculated) : 77.6  Vital Signs: Temp: 98.4 F (36.9 C) (01/22 2340) Temp Source: Oral (01/22 2029) BP: 146/72 (01/23 0010) Pulse Rate: 88 (01/23 0010)  Labs: Recent Labs    04/17/21 1342 04/18/21 0052 04/19/21 0117  HGB  --  15.0 12.7*  HCT  --  40.4 34.9*  PLT  --  132* 101*  CREATININE 1.76* 1.49* 1.33*    Estimated Creatinine Clearance: 60 mL/min (A) (by C-G formula based on SCr of 1.33 mg/dL (H)).   Medical History: Past Medical History:  Diagnosis Date   AKI (acute kidney injury) ([place]) 12/2015   Anxiety    Arthritis    Carotid artery stenosis    Coronary artery disease    Dementia ([place])    problems with short term memory   Depression    DKA (diabetic ketoacidosis) ([place]) 04/16/2021   Dysrhythmia    episode of afib 12/2005 in setting of cocaine use   Gout    Hyperlipidemia    Hypertension    Myocardial infarction [facility])    2017   PAD (peripheral artery disease) (HCC)    Stroke (HCC)    TIA   TIA (transient ischemic attack)    over 20 years ago    Assessment: 66yo male had Eliquis reversed for urgent ileocecectomy/ex-lap, now to begin post-op VTE Px.  Goal of Therapy:  Monitor platelets by anticoagulation protocol: Yes   Plan:  Lovenox 40mg  SQ Q24H. F/u resuming Eliquis.  Wynona Neat, PharmD, BCPS  04/20/2021,1:02 AM

## 2021-04-20 NOTE — Progress Notes (Signed)
ANTICOAGULATION CONSULT NOTE - Initial Consult  Pharmacy Consult for heparin Indication: AF  Allergies  Allergen Reactions   Vicodin [Hydrocodone-Acetaminophen] Nausea And Vomiting    Patient Measurements: Height: 6' (182.9 cm) Weight: 81.6 kg (180 lb) IBW/kg (Calculated) : 77.6  Vital Signs: Temp: 97.9 F (36.6 C) (01/23 0801) Temp Source: Oral (01/23 0801) BP: 131/75 (01/23 0801) Pulse Rate: 97 (01/23 0801)  Labs: Recent Labs    04/18/21 0052 04/19/21 0117 04/20/21 0126  HGB 15.0 12.7* 13.0  HCT 40.4 34.9* 37.2*  PLT 132* 101* 96*  CREATININE 1.49* 1.33* 1.42*     Estimated Creatinine Clearance: 56.2 mL/min (A) (by C-G formula based on SCr of 1.42 mg/dL (H)).   Medical History: Past Medical History:  Diagnosis Date   AKI (acute kidney injury) ([place]) 12/2015   Anxiety    Arthritis    Carotid artery stenosis    Coronary artery disease    Dementia ([place])    problems with short term memory   Depression    DKA (diabetic ketoacidosis) ([place]) 04/16/2021   Dysrhythmia    episode of afib 12/2005 in setting of cocaine use   Gout    Hyperlipidemia    Hypertension    Myocardial infarction [facility])    2017   PAD (peripheral artery disease) (HCC)    Stroke (HCC)    TIA   TIA (transient ischemic attack)    over 20 years ago    Assessment: 66yo male had Eliquis reversed for urgent ileocecectomy/ex-lap. Plan to bridge with IV heparin for 2-3 days before resuming apixaban.   Hgb 13, plt 96k Scr 1.42  Goal of Therapy:  Heparin level: 0.3-0.5 aPTT: 66-85 Monitor platelets by anticoagulation protocol: Yes   Plan:  Dc lovenox Heparin 1200 units/hr 8 hr PTT/HL then daily F/u resume apixaban  Onnie Boer, PharmD, BCIDP, AAHIVP, CPP Infectious Disease Pharmacist 04/20/2021 10:34 AM

## 2021-04-20 NOTE — Progress Notes (Signed)
PROGRESS NOTE                                                                                                                                                                                                             Patient Demographics:    [name], is a 66 y.o. male, DOB - [birth year], TKZ:[id number]  Outpatient Primary MD for the patient is [name], MD    LOS - 4  Admit date - 04/16/2021    Chief Complaint  Patient presents with   Hyperglycemia       Brief Narrative (HPI from H&P)   66 y.o. male with medical history significant of CAD, HTN.  He presented to the ER with about 5 to 7-day history of feeling weak with increase in urination and thirst along with some tremors.  In the ER he was diagnosed with new onset DM type II with DKA and admitted to the hospital.  He was treated for DKA and stabilized he was about to be discharged on 04/19/2021 but right before being discharged he developed acute onset of abdominal pain and was found to have new ischemic bowel he underwent laparotomy with ileocecal resection and now recovering from the surgery.   Subjective:   Patient in bed, appears comfortable, denies any headache, no fever, no chest pain or pressure, no shortness of breath , +ve abdominal pain. No new focal weakness.    Assessment  & Plan :    No notes have been filed under this hospital service. Service: Hospitalist    New diagnosis of DM type II with DKA on presentation - with severe dehydration - AKI, pseudohyponatremia and metabolic acidosis due to DKA.  He was treated with DKA protocol along with aggressive IV fluids and electrolyte replacement, gap is closed he has been transitioned to Lantus and sliding scale dose adjusted on 04/20/2021 as he is n.p.o. postop.  Continue to monitor and adjust closely.  CBG (last 3)  Recent Labs    04/19/21 2343 04/20/21 0446 04/20/21 0805  GLUCAP 94 182*  211*   Lab Results  Component Value Date   HGBA1C 6.2 (H) 07/11/2019  2.  Sudden onset of periumbilical abdominal pain on 04/19/2021.  Work-up suggestive of ischemic bowel, seen by vascular surgery and general surgery underwent emergency exploratory laparotomy with ileocecal resection on 04/19/2021.  Continue hydration, continue heparin drip.  He has plenty of risk factors which are PAD,CAD, Smoking, Afib - Heparin drip resumed on 04/20/2021 after discussions with general surgery.  Continue to monitor with supportive care.  Strictly counseled to quit smoking.  3.  Paroxysmal atrial fibrillation.  Continue beta-blocker and heparin instead of Eliquis in the perioperative period.  Mali vas 2 score of greater than 3.  4.  Hypertension.  Blood pressure is better on beta-blocker monitor add as needed hydralazine..  5.  Dyslipidemia on statin and Vascepa.  6.  AKI on CKD 3A with baseline creatinine of 1.5, AKI due to severe dehydration - holding ARB and HCTZ, has been aggressively hydrated with IV fluids and AKI seems to have resolved.  7. Hypokalemia - replaced.  8.  Hypomagnesemia.  Aggressively replaced.  9. H/O gout.  On allopurinol.  Encouraged the patient to sit up in chair in the daytime use I-S and flutter valve for pulmonary toiletry.  Will advance activity and titrate down oxygen as possible.         Condition - Fair  Family Communication  :  called wife Hassan Rowan [phone] 04/19/21 message left at 4.39 pm, wife updated bedside 04/20/2018  Code Status :  Full  Consults  :  None  PUD Prophylaxis : PPI   Procedures  :     Exploratory laparotomy with Ileocecectomy - by [facility] 04/20/21   CT - 1. Mild circumferential wall thickening of the distal ileum with adjacent inflammation and small amount of free fluid. This is nonspecific but may represent an infectious or inflammatory enteritis. Given very heavy mesenteric arterial calcifications, ischemia is not entirely excluded but no  evidence of pneumatosis intestinalis or pneumoperitoneum. Mildly distended loops of fluid-filled small bowel proximally without transition point, which may represent a mild ileus or low-grade partial obstruction at the distal ileum. 2. Mild bilateral LOWER lobe cylindrical bronchiectasis with minimal tree in bud opacities in the LEFT LOWER lobe, likely representing a mild infectious or inflammatory process. 3. Coronary artery disease and aortic Atherosclerosis (ICD10-I70.0).       Disposition Plan  :    Status is: Inpatient  Remains inpatient appropriate because: DKA  DVT Prophylaxis  :    Place TED hose Start: 04/18/21 1101     Lab Results  Component Value Date   PLT 96 (L) 04/20/2021    Diet :  Diet Order             Diet NPO time specified Except for: Sips with Meds, Ice Chips, Other (See Comments)  Diet effective now                    Inpatient Medications  Scheduled Meds:  acetaminophen  650 mg Oral Q6H   allopurinol  100 mg Oral Daily   atorvastatin  80 mg Oral QHS   Chlorhexidine Gluconate Cloth  6 each Topical Daily   docusate sodium  100 mg Oral BID   escitalopram  20 mg Oral Daily   fentaNYL       icosapent Ethyl  2 g Oral BID   insulin aspart  0-9 Units Subcutaneous Q6H   insulin glargine-yfgn  8 Units Subcutaneous Daily   insulin starter kit- pen needles  1 kit Other Once   living well  with diabetes book   Does not apply Once   metoprolol tartrate  25 mg Oral BID   pantoprazole  40 mg Oral Daily   traZODone  100 mg Oral QHS   Continuous Infusions:  heparin     lactated ringers with kcl     magnesium sulfate bolus IVPB     methocarbamol (ROBAXIN) IV     piperacillin-tazobactam (ZOSYN)  IV 3.375 g (04/20/21 1008)    PRN Meds:.busPIRone, dextrose, hydrALAZINE, HYDROcodone-acetaminophen, HYDROmorphone (DILAUDID) injection, hydrOXYzine, methocarbamol (ROBAXIN) IV, metoprolol tartrate, morphine injection, ondansetron (ZOFRAN) IV, oxyCODONE,  oxyCODONE, prochlorperazine, simethicone  Antibiotics  :    Anti-infectives (From admission, onward)    Start     Dose/Rate Route Frequency Ordered Stop   04/20/21 0200  piperacillin-tazobactam (ZOSYN) IVPB 3.375 g  Status:  Discontinued        3.375 g 12.5 mL/hr over 240 Minutes Intravenous Every 8 hours 04/19/21 1649 04/20/21 0105   04/20/21 0200  piperacillin-tazobactam (ZOSYN) IVPB 3.375 g        3.375 g 12.5 mL/hr over 240 Minutes Intravenous Every 8 hours 04/20/21 0105 04/24/21 0159   04/20/21 0000  piperacillin-tazobactam (ZOSYN) IVPB 3.375 g  Status:  Discontinued        3.375 g 100 mL/hr over 30 Minutes Intravenous Every 6 hours 04/19/21 2334 04/20/21 0105   04/19/21 1745  piperacillin-tazobactam (ZOSYN) IVPB 3.375 g        3.375 g 100 mL/hr over 30 Minutes Intravenous  Once 04/19/21 1649 04/19/21 1910        Time Spent in minutes  30   Lala Lund M.D on 04/20/2021 at 11:04 AM  To page go to [email or web address]   [facility] -  Office  [phone]  See all Orders from today for further details    Objective:   Vitals:   04/20/21 0300 04/20/21 0400 04/20/21 0500 04/20/21 0801  BP: (!) 157/79 (!) 143/84 (!) 152/79 131/75  Pulse:  99  97  Resp:  20  17  Temp:  98 F (36.7 C)  97.9 F (36.6 C)  TempSrc:  Oral  Oral  SpO2:  98%  97%  Weight:      Height:        Wt Readings from Last 3 Encounters:  04/16/21 81.6 kg  03/12/21 85.3 kg  09/09/20 86.8 kg     Intake/Output Summary (Last 24 hours) at 04/20/2021 1104 Last data filed at 04/20/2021 0500 Gross per 24 hour  Intake 2118.1 ml  Output 1150 ml  Net 968.1 ml     Physical Exam  Awake Alert, No new F.N deficits, Normal affect [place].AT,PERRAL Supple Neck, No JVD,   Symmetrical Chest wall movement, Good air movement bilaterally, CTAB RRR,No Gallops, Rubs or new Murmurs,  Hypoactive bowel sounds midsized infraumbilical abdominal incision stable No Cyanosis, Clubbing or edema     Data Review:     CBC Recent Labs  Lab 04/16/21 2043 04/16/21 2051 04/18/21 0052 04/19/21 0117 04/20/21 0126  WBC 13.1*  --  9.2 8.5 4.1  HGB 14.5 15.3 15.0 12.7* 13.0  HCT 45.4 45.0 40.4 34.9* 37.2*  PLT 187  --  132* 101* 96*  MCV 104.1*  --  88.4 90.6 93.9  MCH 33.3  --  32.8 33.0 32.8  MCHC 31.9  --  37.1* 36.4* 34.9  RDW 12.7  --  12.7 12.6 13.0  LYMPHSABS 1.1  --  1.0 1.8  --   MONOABS 0.9  --  0.9 0.8  --  EOSABS 0.0  --  0.0 0.0  --   BASOSABS 0.0  --  0.0 0.0  --     Electrolytes Recent Labs  Lab 04/16/21 2043 04/16/21 2051 04/17/21 0211 04/17/21 0656 04/17/21 0657 04/17/21 1047 04/17/21 1342 04/18/21 0052 04/18/21 0729 04/19/21 0117 04/19/21 1710 04/19/21 1943 04/20/21 0126  NA 114*   < > 127*  --    < > 135 138 137  --  136  --   --  136  K 4.5   < > 4.2  --    < > 3.4* 3.3* 3.3*  --  3.0*  --   --  3.5  CL 77*   < > 93*  --    < > 99 105 104  --  103  --   --  105  CO2 10*   < > 18*  --    < > 19* 26 24  --  23  --   --  16*  GLUCOSE 1,593*   < > 1,085*  --    < > 652* 224* 252*  --  88  --   --  148*  BUN 68*   < > 60*  --    < > 54* 40* 30*  --  26*  --   --  23  CREATININE 3.46*   < > 2.76*  --    < > 2.12* 1.76* 1.49*  --  1.33*  --   --  1.42*  CALCIUM 8.4*   < > 8.5*  --    < > 9.1 8.6* 8.6*  --  7.8*  --   --  7.0*  AST 21  --   --   --   --   --   --  22  --  24  --   --  28  ALT 25  --   --   --   --   --   --  21  --  18  --   --  22  ALKPHOS 111  --   --   --   --   --   --  71  --  60  --   --  51  BILITOT 1.1  --   --   --   --   --   --  0.6  --  1.2  --   --  1.0  ALBUMIN 3.7  --   --   --   --   --   --  2.9*  --  2.4*  --   --  2.4*  MG  --   --   --  2.4  --   --   --  1.6*  --  2.1  --   --  1.7  CRP  --   --   --   --   --   --   --   --   --   --  18.7*  --  19.5*  DDIMER  --   --   --   --   --   --   --   --   --   --  6.43*  --  7.14*  PROCALCITON  --   --   --   --   --   --   --  1.44  --  0.86  --   --  0.57  LATICACIDVEN  --   --    --   --   --   --   --   --   --   --  1.3 1.2  --   TSH  --   --  0.636  --   --   --   --   --   --   --   --   --   --   BNP  --   --   --   --   --   --   --   --  315.9*  --   --   --   --    < > = values in this interval not displayed.    ------------------------------------------------------------------------------------------------------------------ No results for input(s): CHOL, HDL, LDLCALC, TRIG, CHOLHDL, LDLDIRECT in the last 72 hours.  Lab Results  Component Value Date   HGBA1C 6.2 (H) 07/11/2019    No results for input(s): TSH, T4TOTAL, T3FREE, THYROIDAB in the last 72 hours.  Invalid input(s): FREET3  ------------------------------------------------------------------------------------------------------------------ ID Labs Recent Labs  Lab 04/16/21 2043 04/16/21 2315 04/17/21 1047 04/17/21 1342 04/18/21 0052 04/19/21 0117 04/19/21 1710 04/19/21 1943 04/20/21 0126  WBC 13.1*  --   --   --  9.2 8.5  --   --  4.1  PLT 187  --   --   --  132* 101*  --   --  96*  CRP  --   --   --   --   --   --  18.7*  --  19.5*  DDIMER  --   --   --   --   --   --  6.43*  --  7.14*  PROCALCITON  --   --   --   --  1.44 0.86  --   --  0.57  LATICACIDVEN  --   --   --   --   --   --  1.3 1.2  --   CREATININE 3.46*   < > 2.12* 1.76* 1.49* 1.33*  --   --  1.42*   < > = values in this interval not displayed.   Cardiac Enzymes No results for input(s): CKMB, TROPONINI, MYOGLOBIN in the last 168 hours.  Invalid input(s): CK  Radiology Reports CT ABDOMEN PELVIS WO CONTRAST  Result Date: 04/19/2021 CLINICAL DATA:  66 year old male with acute abdominal and pelvic pain. EXAM: CT ABDOMEN AND PELVIS WITHOUT CONTRAST TECHNIQUE: Multidetector CT imaging of the abdomen and pelvis was performed following the standard protocol without IV contrast. COMPARISON:  05/01/2008 CT FINDINGS: Unenhanced CT was performed per clinician order. Lack of IV contrast limits sensitivity and specificity,  especially for evaluation of vascular structures and abdominal/pelvic solid viscera. Lower chest: Mild bilateral LOWER lobe cylindrical bronchiectasis noted with minimal tree in bud opacities in the LEFT LOWER lobe. Coronary artery atherosclerotic calcifications are present. Hepatobiliary: No hepatic or gallbladder abnormalities are identified. No biliary dilatation noted. Pancreas: Unremarkable Spleen: Unremarkable Adrenals/Urinary Tract: The kidneys, adrenal glands and bladder are unremarkable. Stomach/Bowel: Mildly distended loops of fluid-filled small bowel are noted without transition point. There is mild circumferential wall thickening of the distal ileum to the cecum with adjacent inflammation and small amount of free fluid. No other areas of bowel wall thickening are noted. No evidence of pneumoperitoneum, pneumatosis intestinalis or portal venous gas. The appendix appears normal. Vascular/Lymphatic: Heavy arterial vascular calcifications are noted including aortic atherosclerosis. Heavy SMA and IMA calcifications are identified. No enlarged lymph nodes are identified. Reproductive: Prostate is unremarkable. Other: Small amount of free fluid is noted within the pelvis, RIGHT LOWER quadrant and adjacent to the liver and spleen. Musculoskeletal: No  acute or suspicious bony abnormalities are noted. IMPRESSION: 1. Mild circumferential wall thickening of the distal ileum with adjacent inflammation and small amount of free fluid. This is nonspecific but may represent an infectious or inflammatory enteritis. Given very heavy mesenteric arterial calcifications, ischemia is not entirely excluded but no evidence of pneumatosis intestinalis or pneumoperitoneum. Mildly distended loops of fluid-filled small bowel proximally without transition point, which may represent a mild ileus or low-grade partial obstruction at the distal ileum. 2. Mild bilateral LOWER lobe cylindrical bronchiectasis with minimal tree in bud  opacities in the LEFT LOWER lobe, likely representing a mild infectious or inflammatory process. 3. Coronary artery disease and aortic Atherosclerosis (ICD10-I70.0). Electronically Signed   By: Margarette Canada M.D.   On: 04/19/2021 11:00   DG Chest 2 View  Result Date: 04/16/2021 CLINICAL DATA:  Tremors, weakness, short of breath EXAM: CHEST - 2 VIEW COMPARISON:  04/07/2016 FINDINGS: Frontal and lateral views of the chest demonstrate an unremarkable cardiac silhouette. No acute airspace disease, effusion, or pneumothorax. No acute bony abnormalities. IMPRESSION: 1. No acute intrathoracic process. Electronically Signed   By: Randa Ngo M.D.   On: 04/16/2021 21:30   DG Chest Port 1 View  Result Date: 04/18/2021 CLINICAL DATA:  Shortness of breath. EXAM: PORTABLE CHEST 1 VIEW COMPARISON:  04/16/2021 FINDINGS: The heart size and mediastinal contours are within normal limits. Both lungs are clear. The visualized skeletal structures are unremarkable. IMPRESSION: No active disease. Electronically Signed   By: Kerby Moors M.D.   On: 04/18/2021 08:21   CT Angio Abd/Pel w/ and/or w/o  Result Date: 04/19/2021 CLINICAL DATA:  Mesenteric ischemia EXAM: CTA ABDOMEN AND PELVIS WITHOUT AND WITH CONTRAST TECHNIQUE: Multidetector CT imaging of the abdomen and pelvis was performed using the standard protocol during bolus administration of intravenous contrast. Multiplanar reconstructed images and MIPs were obtained and reviewed to evaluate the vascular anatomy. RADIATION DOSE REDUCTION: This exam was performed according to the departmental dose-optimization program which includes automated exposure control, adjustment of the mA and/or kV according to patient size and/or use of iterative reconstruction technique. CONTRAST:  130m OMNIPAQUE IOHEXOL 350 MG/ML SOLN COMPARISON:  04/19/2021 FINDINGS: VASCULAR Normal contour and caliber of the abdominal aorta. No evidence of aneurysm, dissection, or other acute aortic  pathology. Duplicated right renal arteries, with a small accessory inferior pole artery and a stent of the dominant main renal artery. Solitary left renal artery. Otherwise standard branching pattern of the abdominal aorta. Moderate mixed calcific atherosclerosis. Mixed atherosclerosis of the branch vessel origins as well as the first and second order branch vessels of the superior mesenteric artery without high-grade stenosis. Review of the MIP images confirms the above findings. NON-VASCULAR Lower chest: Coronary artery calcifications. Scattered centrilobular nodular and ground-glass opacity of the left lung base (series 8, image 18). Hepatobiliary: No solid liver abnormality is seen. Hepatic steatosis. No gallstones, gallbladder wall thickening, or biliary dilatation. Pancreas: Unremarkable. No pancreatic ductal dilatation or surrounding inflammatory changes. Spleen: Normal in size without significant abnormality. Adrenals/Urinary Tract: Adrenal glands are unremarkable. Kidneys are normal, without renal calculi, solid lesion, or hydronephrosis. Bladder is unremarkable. Stomach/Bowel: Stomach is within normal limits. Appendix appears normal. Wall thickening and adjacent inflammatory fat stranding of the cecal base and terminal ileum, involving approximately 20 cm of the terminal ileum (series 11, image 71). Lymphatic: No enlarged abdominal or pelvic lymph nodes. Reproductive: No mass or other significant abnormality. Other: No abdominal wall hernia or abnormality. Small volume perisplenic and pelvic ascites. Musculoskeletal: No  acute osseous findings. IMPRESSION: 1. Normal contour and caliber of the abdominal aorta. No evidence of aneurysm, dissection, or other acute aortic pathology. 2. Moderate mixed calcific atherosclerosis without high-grade stenosis of the branch vessel origins as well as the first and second order branch vessels of the superior mesenteric artery without occlusion or high-grade stenosis. 3.  Stent at the origin of the dominant right renal artery. 4. Wall thickening and adjacent inflammatory fat stranding of the cecal base and terminal ileum, involving approximately 20 cm of the terminal ileum, consistent with nonspecific infectious or inflammatory terminal ileitis, which may be of infectious, inflammatory, or ischemic etiology. Crohn's disease is a substantial differential consideration given this appearance and distribution. 5. Scattered centrilobular nodular and ground-glass opacity of the left lung base, consistent with nonspecific infectious or aspiration. 6. Hepatic steatosis. 7. Small volume ascites. Electronically Signed   By: Delanna Ahmadi M.D.   On: 04/19/2021 18:46

## 2021-04-20 NOTE — Progress Notes (Signed)
Progress Note  1 Day Post-Op  Subjective: Expected post op abdominal pain but tolerable and actually improved from preoperatively. No nausea, emesis. No flatus or BM since surgery. Was having diarrhea prior to surgery. Has not been oob   Objective: Vital signs in last 24 hours: Temp:  [97.6 F (36.4 C)-98.4 F (36.9 C)] 97.9 F (36.6 C) (01/23 0801) Pulse Rate:  [84-99] 97 (01/23 0801) Resp:  [17-25] 17 (01/23 0801) BP: (95-157)/(68-84) 131/75 (01/23 0801) SpO2:  [92 %-99 %] 97 % (01/23 0801) Last BM Date: 04/19/21  Intake/Output from previous day: 01/22 0701 - 01/23 0700 In: 2118.1 [I.V.:1679.5; IV Piggyback:438.6] Out: 1150 [Urine:850; Blood:300] Intake/Output this shift: No intake/output data recorded.  PE: General: pleasant, WD, male who is laying in bed in NAD HEENT: head is normocephalic, atraumatic. Mouth is pink and moist Heart: regular, rate, and rhythm.  Lungs: Respiratory effort nonlabored  Abd: soft, ND, hypoactive BS. TTP over midline wound in RLQ without rebound or guarding. Midline wound with honeycomb bandage c/d/I with scant blood at distal border at site of penrose drain. Staples intact MSK: all 4 extremities are symmetrical with no cyanosis, clubbing, or edema. Skin: warm and dry Psych: A&Ox3 with an appropriate affect.    Lab Results:  Recent Labs    04/19/21 0117 04/20/21 0126  WBC 8.5 4.1  HGB 12.7* 13.0  HCT 34.9* 37.2*  PLT 101* 96*   BMET Recent Labs    04/19/21 0117 04/20/21 0126  NA 136 136  K 3.0* 3.5  CL 103 105  CO2 23 16*  GLUCOSE 88 148*  BUN 26* 23  CREATININE 1.33* 1.42*  CALCIUM 7.8* 7.0*   PT/INR No results for input(s): LABPROT, INR in the last 72 hours. CMP     Component Value Date/Time   NA 136 04/20/2021 0126   NA 141 07/11/2019 0812   K 3.5 04/20/2021 0126   CL 105 04/20/2021 0126   CO2 16 (L) 04/20/2021 0126   GLUCOSE 148 (H) 04/20/2021 0126   BUN 23 04/20/2021 0126   BUN 15 07/11/2019 0812    CREATININE 1.42 (H) 04/20/2021 0126   CREATININE 1.25 10/09/2020 1556   CALCIUM 7.0 (L) 04/20/2021 0126   PROT 5.2 (L) 04/20/2021 0126   PROT 6.7 07/11/2019 0811   ALBUMIN 2.4 (L) 04/20/2021 0126   ALBUMIN 4.1 07/11/2019 0811   AST 28 04/20/2021 0126   ALT 22 04/20/2021 0126   ALKPHOS 51 04/20/2021 0126   BILITOT 1.0 04/20/2021 0126   BILITOT 0.3 07/11/2019 0811   GFRNONAA 54 (L) 04/20/2021 0126   GFRAA 87 07/11/2019 0812   Lipase     Component Value Date/Time   LIPASE 85 (H) 04/19/2021 0117       Studies/Results: CT ABDOMEN PELVIS WO CONTRAST  Result Date: 04/19/2021 CLINICAL DATA:  66 year old male with acute abdominal and pelvic pain. EXAM: CT ABDOMEN AND PELVIS WITHOUT CONTRAST TECHNIQUE: Multidetector CT imaging of the abdomen and pelvis was performed following the standard protocol without IV contrast. COMPARISON:  05/01/2008 CT FINDINGS: Unenhanced CT was performed per clinician order. Lack of IV contrast limits sensitivity and specificity, especially for evaluation of vascular structures and abdominal/pelvic solid viscera. Lower chest: Mild bilateral LOWER lobe cylindrical bronchiectasis noted with minimal tree in bud opacities in the LEFT LOWER lobe. Coronary artery atherosclerotic calcifications are present. Hepatobiliary: No hepatic or gallbladder abnormalities are identified. No biliary dilatation noted. Pancreas: Unremarkable Spleen: Unremarkable Adrenals/Urinary Tract: The kidneys, adrenal glands and bladder are unremarkable. Stomach/Bowel:  Mildly distended loops of fluid-filled small bowel are noted without transition point. There is mild circumferential wall thickening of the distal ileum to the cecum with adjacent inflammation and small amount of free fluid. No other areas of bowel wall thickening are noted. No evidence of pneumoperitoneum, pneumatosis intestinalis or portal venous gas. The appendix appears normal. Vascular/Lymphatic: Heavy arterial vascular calcifications  are noted including aortic atherosclerosis. Heavy SMA and IMA calcifications are identified. No enlarged lymph nodes are identified. Reproductive: Prostate is unremarkable. Other: Small amount of free fluid is noted within the pelvis, RIGHT LOWER quadrant and adjacent to the liver and spleen. Musculoskeletal: No acute or suspicious bony abnormalities are noted. IMPRESSION: 1. Mild circumferential wall thickening of the distal ileum with adjacent inflammation and small amount of free fluid. This is nonspecific but may represent an infectious or inflammatory enteritis. Given very heavy mesenteric arterial calcifications, ischemia is not entirely excluded but no evidence of pneumatosis intestinalis or pneumoperitoneum. Mildly distended loops of fluid-filled small bowel proximally without transition point, which may represent a mild ileus or low-grade partial obstruction at the distal ileum. 2. Mild bilateral LOWER lobe cylindrical bronchiectasis with minimal tree in bud opacities in the LEFT LOWER lobe, likely representing a mild infectious or inflammatory process. 3. Coronary artery disease and aortic Atherosclerosis (ICD10-I70.0). Electronically Signed   By: Margarette Canada M.D.   On: 04/19/2021 11:00   CT Angio Abd/Pel w/ and/or w/o  Result Date: 04/19/2021 CLINICAL DATA:  Mesenteric ischemia EXAM: CTA ABDOMEN AND PELVIS WITHOUT AND WITH CONTRAST TECHNIQUE: Multidetector CT imaging of the abdomen and pelvis was performed using the standard protocol during bolus administration of intravenous contrast. Multiplanar reconstructed images and MIPs were obtained and reviewed to evaluate the vascular anatomy. RADIATION DOSE REDUCTION: This exam was performed according to the departmental dose-optimization program which includes automated exposure control, adjustment of the mA and/or kV according to patient size and/or use of iterative reconstruction technique. CONTRAST:  152mL OMNIPAQUE IOHEXOL 350 MG/ML SOLN COMPARISON:   04/19/2021 FINDINGS: VASCULAR Normal contour and caliber of the abdominal aorta. No evidence of aneurysm, dissection, or other acute aortic pathology. Duplicated right renal arteries, with a small accessory inferior pole artery and a stent of the dominant main renal artery. Solitary left renal artery. Otherwise standard branching pattern of the abdominal aorta. Moderate mixed calcific atherosclerosis. Mixed atherosclerosis of the branch vessel origins as well as the first and second order branch vessels of the superior mesenteric artery without high-grade stenosis. Review of the MIP images confirms the above findings. NON-VASCULAR Lower chest: Coronary artery calcifications. Scattered centrilobular nodular and ground-glass opacity of the left lung base (series 8, image 18). Hepatobiliary: No solid liver abnormality is seen. Hepatic steatosis. No gallstones, gallbladder wall thickening, or biliary dilatation. Pancreas: Unremarkable. No pancreatic ductal dilatation or surrounding inflammatory changes. Spleen: Normal in size without significant abnormality. Adrenals/Urinary Tract: Adrenal glands are unremarkable. Kidneys are normal, without renal calculi, solid lesion, or hydronephrosis. Bladder is unremarkable. Stomach/Bowel: Stomach is within normal limits. Appendix appears normal. Wall thickening and adjacent inflammatory fat stranding of the cecal base and terminal ileum, involving approximately 20 cm of the terminal ileum (series 11, image 71). Lymphatic: No enlarged abdominal or pelvic lymph nodes. Reproductive: No mass or other significant abnormality. Other: No abdominal wall hernia or abnormality. Small volume perisplenic and pelvic ascites. Musculoskeletal: No acute osseous findings. IMPRESSION: 1. Normal contour and caliber of the abdominal aorta. No evidence of aneurysm, dissection, or other acute aortic pathology. 2. Moderate mixed calcific  atherosclerosis without high-grade stenosis of the branch vessel  origins as well as the first and second order branch vessels of the superior mesenteric artery without occlusion or high-grade stenosis. 3. Stent at the origin of the dominant right renal artery. 4. Wall thickening and adjacent inflammatory fat stranding of the cecal base and terminal ileum, involving approximately 20 cm of the terminal ileum, consistent with nonspecific infectious or inflammatory terminal ileitis, which may be of infectious, inflammatory, or ischemic etiology. Crohn's disease is a substantial differential consideration given this appearance and distribution. 5. Scattered centrilobular nodular and ground-glass opacity of the left lung base, consistent with nonspecific infectious or aspiration. 6. Hepatic steatosis. 7. Small volume ascites. Electronically Signed   By: Delanna Ahmadi M.D.   On: 04/19/2021 18:46    Anti-infectives: Anti-infectives (From admission, onward)    Start     Dose/Rate Route Frequency Ordered Stop   04/20/21 0200  piperacillin-tazobactam (ZOSYN) IVPB 3.375 g  Status:  Discontinued        3.375 g 12.5 mL/hr over 240 Minutes Intravenous Every 8 hours 04/19/21 1649 04/20/21 0105   04/20/21 0200  piperacillin-tazobactam (ZOSYN) IVPB 3.375 g        3.375 g 12.5 mL/hr over 240 Minutes Intravenous Every 8 hours 04/20/21 0105 04/24/21 0159   04/20/21 0000  piperacillin-tazobactam (ZOSYN) IVPB 3.375 g  Status:  Discontinued        3.375 g 100 mL/hr over 30 Minutes Intravenous Every 6 hours 04/19/21 2334 04/20/21 0105   04/19/21 1745  piperacillin-tazobactam (ZOSYN) IVPB 3.375 g        3.375 g 100 mL/hr over 30 Minutes Intravenous  Once 04/19/21 1649 04/19/21 1910        Assessment/Plan  Ischemic bowel  POD1 s/p dx lap converted to ex lap with ileocecectomy - Dr. Thermon Leyland 1/22 - continue NPO with sips and chips today - okay to resume anticoagulation with heparin drip, no bolus - foley can be discontinued from our standpoint - defer to primary in case  needed for intake/output monitoring - PT/OT already ordered - continue IV abx for now  FEN: NPO sips/chips, IVF ID: [email or web address] 1/23> VTE: lovenox. Okay to resume anticoagulation with heparin gtt today  Per primary: T2DM with DKA AKI Afib on eliquis baseline HTN     LOS: 4 days   Winferd Humphrey, [facility] 04/20/2021, 9:05 AM Please see Amion for pager number during day hours 7:00am-4:30pm

## 2021-04-20 NOTE — Progress Notes (Signed)
ANTICOAGULATION CONSULT NOTE -follow up  Pharmacy Consult for heparin Indication: AF  Allergies  Allergen Reactions   Vicodin [Hydrocodone-Acetaminophen] Nausea And Vomiting    Patient Measurements: Height: 6' (182.9 cm) Weight: 81.6 kg (180 lb) IBW/kg (Calculated) : 77.6 Heparin dosing weight:  =TBW 81.6 kg   Vital Signs: Temp: 98.1 F (36.7 C) (01/23 1944) Temp Source: Oral (01/23 1944) BP: 135/82 (01/23 1944) Pulse Rate: 90 (01/23 1944)  Labs: Recent Labs    04/18/21 0052 04/19/21 0117 04/20/21 0126 04/20/21 1857  HGB 15.0 12.7* 13.0  --   HCT 40.4 34.9* 37.2*  --   PLT 132* 101* 96*  --   APTT  --   --   --  39*  HEPARINUNFRC  --   --   --  0.69  CREATININE 1.49* 1.33* 1.42*  --      Estimated Creatinine Clearance: 56.2 mL/min (A) (by C-G formula based on SCr of 1.42 mg/dL (H)).   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 Plan to bridge with IV heparin for 2-3 days before resuming apixaban.   The  8 hour  PTT = 39  and  HL 0.69 on heparin 1200 ut/hr .  S/p Eliquis reversed  w/ Kcentra on 1/22 for urgent ileocecectomy/ex 1/22.  Last dose of eliquis given 04/19/21 @ 08:11 AM.  The aPTT  is sub-therapeutic. Goal PTT 66-85 sec.  HL likely elevated due to some residual effect form Eliquis.  No bleeding reported.   Hgb 13 stable, plt 96k  this morning, down from 101,   and was 187K on admit 1/19.  Scr 1.42  Goal of Therapy:  Heparin level: 0.3-0.5 aPTT: 66-85 Monitor platelets by anticoagulation protocol: Yes   Plan:  Increase Heparin to  1350 units/hr Check ~ 6 hr PTT/HL tomorrow ~ 0400.  Daily PTT, HL and CBC.  Watch pltc. F/u resume apixaban   Thank you for allowing pharmacy to be part of this patients care team.  Nicole Cella, RPh Clinical Pharmacist Please check AMION for all [place] phone numbers After 10:00 PM, call [place] 04/20/2021 8:39 PM

## 2021-04-20 NOTE — Progress Notes (Addendum)
°  Transition of Care [facility]) Screening Note   Patient Details  Name: [name] Date of Birth: 11-15-1955   Transition of Care [facility]) CM/SW Contact:    Benard Halsted, LCSW Phone Number: 04/20/2021, 8:56 AM    Transition of Care Department [facility]) has reviewed patient and acknowledges SNF consult. We will continue to monitor patient advancement through interdisciplinary progression rounds.

## 2021-04-21 LAB — CBC
HCT: 30.3 % — ABNORMAL LOW (ref 39.0–52.0)
Hemoglobin: 10.6 g/dL — ABNORMAL LOW (ref 13.0–17.0)
MCH: 33.2 pg (ref 26.0–34.0)
MCHC: 35 g/dL (ref 30.0–36.0)
MCV: 95 fL (ref 80.0–100.0)
Platelets: 91 10*3/uL — ABNORMAL LOW (ref 150–400)
RBC: 3.19 MIL/uL — ABNORMAL LOW (ref 4.22–5.81)
RDW: 13.3 % (ref 11.5–15.5)
WBC: 6.4 10*3/uL (ref 4.0–10.5)
nRBC: 0 % (ref 0.0–0.2)

## 2021-04-21 LAB — COMPREHENSIVE METABOLIC PANEL
ALT: 22 U/L (ref 0–44)
AST: 28 U/L (ref 15–41)
Albumin: 1.9 g/dL — ABNORMAL LOW (ref 3.5–5.0)
Alkaline Phosphatase: 48 U/L (ref 38–126)
Anion gap: 7 (ref 5–15)
BUN: 15 mg/dL (ref 8–23)
CO2: 22 mmol/L (ref 22–32)
Calcium: 6.8 mg/dL — ABNORMAL LOW (ref 8.9–10.3)
Chloride: 109 mmol/L (ref 98–111)
Creatinine, Ser: 1.19 mg/dL (ref 0.61–1.24)
GFR, Estimated: >60 mL/min (ref >60)
Glucose, Bld: 112 mg/dL — ABNORMAL HIGH (ref 70–99)
Potassium: 3.8 mmol/L (ref 3.5–5.1)
Sodium: 138 mmol/L (ref 135–145)
Total Bilirubin: 0.7 mg/dL (ref 0.3–1.2)
Total Protein: 4.8 g/dL — ABNORMAL LOW (ref 6.5–8.1)

## 2021-04-21 LAB — PROCALCITONIN: Procalcitonin: 1.38 ng/mL

## 2021-04-21 LAB — GLUCOSE, CAPILLARY
Glucose-Capillary: 104 mg/dL — ABNORMAL HIGH (ref 70–99)
Glucose-Capillary: 120 mg/dL — ABNORMAL HIGH (ref 70–99)
Glucose-Capillary: 195 mg/dL — ABNORMAL HIGH (ref 70–99)
Glucose-Capillary: 155 mg/dL — ABNORMAL HIGH (ref 70–99)

## 2021-04-21 LAB — HEPARIN LEVEL (UNFRACTIONATED)
Heparin Unfractionated: 0.59 IU/mL (ref 0.30–0.70)
Heparin Unfractionated: 0.53 IU/mL (ref 0.30–0.70)

## 2021-04-21 LAB — APTT
aPTT: 76 seconds — ABNORMAL HIGH (ref 24–36)
aPTT: 77 seconds — ABNORMAL HIGH (ref 24–36)

## 2021-04-21 LAB — MAGNESIUM: Magnesium: 2 mg/dL (ref 1.7–2.4)

## 2021-04-21 MED ORDER — LACTATED RINGERS IV BOLUS
500.0000 mL | Freq: Once | INTRAVENOUS | Status: AC
  Administered 2021-04-21: 06:00:00 500 mL via INTRAVENOUS

## 2021-04-21 MED ORDER — INSULIN STARTER KIT- PEN NEEDLES (ENGLISH)
1.0000 | Freq: Once | Status: AC
  Administered 2021-04-21: 14:00:00 1
  Filled 2021-04-21: qty 1

## 2021-04-21 NOTE — Progress Notes (Signed)
PROGRESS NOTE                                                                                                                                                                                                             Patient Demographics:    [name], is a 66 y.o. male, DOB - 19-Oct-1955, ZOX:[id number]  Outpatient Primary MD for the patient is [name], MD    LOS - 5  Admit date - 04/16/2021    Chief Complaint  Patient presents with   Hyperglycemia       Brief Narrative (HPI from H&P)   66 y.o. male with medical history significant of CAD, HTN.  He presented to the ER with about 5 to 7-day history of feeling weak with increase in urination and thirst along with some tremors.  In the ER he was diagnosed with new onset DM type II with DKA and admitted to the hospital.  He was treated for DKA and stabilized he was about to be discharged on 04/19/2021 but right before being discharged he developed acute onset of abdominal pain and was found to have new ischemic bowel he underwent laparotomy with ileocecal resection and now recovering from the surgery.   Subjective:   Patient in bed, appears comfortable, denies any headache, no fever, no chest pain or pressure, no shortness of breath , no abdominal pain, passing flatus since this morning. No new focal weakness.   Assessment  & Plan :    No notes have been filed under this hospital service. Service: Hospitalist    New diagnosis of DM type II with DKA on presentation - with severe dehydration - AKI, pseudohyponatremia and metabolic acidosis due to DKA.  He was treated with DKA protocol along with aggressive IV fluids and electrolyte replacement, gap is closed he has been transitioned to Lantus and sliding scale, dose adjusted on 04/20/2021 as he is n.p.o. postop.  Continue to monitor and adjust closely.  CBG (last 3)  Recent Labs    04/20/21 2008 04/21/21 0214  04/21/21 0741  GLUCAP 110* 104* 120*   Lab Results  Component Value Date   HGBA1C >15.5 (H) 04/19/2021     2.  Sudden onset of periumbilical abdominal pain on 04/19/2021.  Work-up suggestive of ischemic bowel, seen by vascular surgery and general surgery underwent emergency exploratory laparotomy  with ileocecal resection on 04/19/2021.  Continue hydration, continue heparin drip.  He has plenty of risk factors which are PAD,CAD, Smoking, Afib - Heparin drip resumed on 04/20/2021 after discussions with general surgery.  On IV Zosyn, defer stop date to general surgery continue to monitor with supportive care.  Strictly counseled to quit smoking.  He is placing flatus on 04/21/2021 defer oral intake and IV antibiotics to general surgery for now continue IV fluids.  3.  Paroxysmal atrial fibrillation.  Continue beta-blocker and heparin instead of Eliquis in the perioperative period.  Mali vas 2 score of greater than 3.  4.  Hypertension.  Blood pressure is better on beta-blocker monitor add as needed hydralazine..  5.  Dyslipidemia on statin and Vascepa.  6.  AKI on CKD 3A with baseline creatinine of 1.5, AKI due to severe dehydration - holding ARB and HCTZ, has been aggressively hydrated with IV fluids and AKI seems to have resolved.  7. Hypokalemia - replaced.  8.  Hypomagnesemia.  Aggressively replaced.  9. H/O gout.  On allopurinol.  Encouraged the patient to sit up in chair in the daytime use I-S and flutter valve for pulmonary toiletry.  Will advance activity and titrate down oxygen as possible.         Condition - Fair  Family Communication  :  called wife Hassan Rowan [phone] 04/19/21 message left at 4.39 pm, wife updated bedside 04/20/2018  Code Status :  Full  Consults  :  None  PUD Prophylaxis : PPI   Procedures  :     Exploratory laparotomy with Ileocecectomy - by [facility] 04/20/21   CT - 1. Mild circumferential wall thickening of the distal ileum with adjacent inflammation  and small amount of free fluid. This is nonspecific but may represent an infectious or inflammatory enteritis. Given very heavy mesenteric arterial calcifications, ischemia is not entirely excluded but no evidence of pneumatosis intestinalis or pneumoperitoneum. Mildly distended loops of fluid-filled small bowel proximally without transition point, which may represent a mild ileus or low-grade partial obstruction at the distal ileum. 2. Mild bilateral LOWER lobe cylindrical bronchiectasis with minimal tree in bud opacities in the LEFT LOWER lobe, likely representing a mild infectious or inflammatory process. 3. Coronary artery disease and aortic Atherosclerosis (ICD10-I70.0).       Disposition Plan  :    Status is: Inpatient  Remains inpatient appropriate because: DKA  DVT Prophylaxis  :    Place TED hose Start: 04/18/21 1101     Lab Results  Component Value Date   PLT 91 (L) 04/21/2021    Diet :  Diet Order             Diet NPO time specified Except for: Sips with Meds, Ice Chips, Other (See Comments)  Diet effective now                    Inpatient Medications  Scheduled Meds:  acetaminophen  650 mg Oral Q6H   allopurinol  100 mg Oral Daily   atorvastatin  80 mg Oral QHS   Chlorhexidine Gluconate Cloth  6 each Topical Daily   docusate sodium  100 mg Oral BID   escitalopram  20 mg Oral Daily   icosapent Ethyl  2 g Oral BID   insulin aspart  0-9 Units Subcutaneous Q6H   insulin glargine-yfgn  8 Units Subcutaneous Daily   insulin starter kit- pen needles  1 kit Other Once   living well with diabetes book  Does not apply Once   metoprolol tartrate  25 mg Oral BID   pantoprazole  40 mg Oral Daily   traZODone  100 mg Oral QHS   Continuous Infusions:  heparin 1,350 Units/hr (04/21/21 0823)   lactated ringers with kcl 75 mL/hr at 04/21/21 0625   methocarbamol (ROBAXIN) IV     piperacillin-tazobactam (ZOSYN)  IV 3.375 g (04/21/21 0625)    PRN Meds:.busPIRone,  dextrose, hydrALAZINE, HYDROcodone-acetaminophen, HYDROmorphone (DILAUDID) injection, hydrOXYzine, methocarbamol (ROBAXIN) IV, metoprolol tartrate, morphine injection, ondansetron (ZOFRAN) IV, oxyCODONE, oxyCODONE, prochlorperazine, simethicone  Antibiotics  :    Anti-infectives (From admission, onward)    Start     Dose/Rate Route Frequency Ordered Stop   04/20/21 0200  piperacillin-tazobactam (ZOSYN) IVPB 3.375 g  Status:  Discontinued        3.375 g 12.5 mL/hr over 240 Minutes Intravenous Every 8 hours 04/19/21 1649 04/20/21 0105   04/20/21 0200  piperacillin-tazobactam (ZOSYN) IVPB 3.375 g        3.375 g 12.5 mL/hr over 240 Minutes Intravenous Every 8 hours 04/20/21 0105 04/24/21 0559   04/20/21 0000  piperacillin-tazobactam (ZOSYN) IVPB 3.375 g  Status:  Discontinued        3.375 g 100 mL/hr over 30 Minutes Intravenous Every 6 hours 04/19/21 2334 04/20/21 0105   04/19/21 1745  piperacillin-tazobactam (ZOSYN) IVPB 3.375 g        3.375 g 100 mL/hr over 30 Minutes Intravenous  Once 04/19/21 1649 04/19/21 1910        Time Spent in minutes  30   Lala Lund M.D on 04/21/2021 at 9:39 AM  To page go to [email or web address]   [facility] -  Office  [phone]  See all Orders from today for further details    Objective:   Vitals:   04/20/21 1944 04/21/21 0000 04/21/21 0411 04/21/21 0739  BP: 135/82 106/64 (!) 89/56 133/69  Pulse: 90 68 66 78  Resp: _0 Temp: 98.1 F (36.7 C) 98.7 F (37.1 C) 97.8 F (36.6 C) 97.8 F (36.6 C)  TempSrc: Oral Oral Oral Oral  SpO2: 95% 97% 97% 95%  Weight:      Height:        Wt Readings from Last 3 Encounters:  04/16/21 81.6 kg  03/12/21 85.3 kg  09/09/20 86.8 kg     Intake/Output Summary (Last 24 hours) at 04/21/2021 0939 Last data filed at 04/21/2021 0630 Gross per 24 hour  Intake 735.43 ml  Output 2150 ml  Net -1414.57 ml     Physical Exam  Awake Alert, No new F.N deficits, Normal  affect [place].AT,PERRAL Supple Neck, No JVD,   Symmetrical Chest wall movement, Good air movement bilaterally, CTAB RRR,No Gallops, Rubs or new Murmurs,  Hypoactive bowel sounds midsized infraumbilical abdominal incision stable  No Cyanosis, Clubbing or edema      Data Review:    CBC Recent Labs  Lab 04/16/21 2043 04/16/21 2051 04/18/21 0052 04/19/21 0117 04/20/21 0126 04/21/21 0443  WBC 13.1*  --  9.2 8.5 4.1 6.4  HGB 14.5 15.3 15.0 12.7* 13.0 10.6*  HCT 45.4 45.0 40.4 34.9* 37.2* 30.3*  PLT 187  --  132* 101* 96* 91*  MCV 104.1*  --  88.4 90.6 93.9 95.0  MCH 33.3  --  32.8 33.0 32.8 33.2  MCHC 31.9  --  37.1* 36.4* 34.9 35.0  RDW 12.7  --  12.7 12.6 13.0 13.3  LYMPHSABS 1.1  --  1.0 1.8  --   --  MONOABS 0.9  --  0.9 0.8  --   --   EOSABS 0.0  --  0.0 0.0  --   --   BASOSABS 0.0  --  0.0 0.0  --   --     Electrolytes Recent Labs  Lab 04/16/21 2043 04/16/21 2051 04/17/21 0211 04/17/21 0656 04/17/21 0657 04/17/21 1342 04/18/21 0052 04/18/21 0729 04/18/21 1431 04/19/21 0117 04/19/21 1710 04/19/21 1943 04/20/21 0126 04/21/21 0443  NA 114*   < > 127*  --    < > 138 137  --   --  136  --   --  136 138  K 4.5   < > 4.2  --    < > 3.3* 3.3*  --   --  3.0*  --   --  3.5 3.8  CL 77*   < > 93*  --    < > 105 104  --   --  103  --   --  105 109  CO2 10*   < > 18*  --    < > 26 24  --   --  23  --   --  16* 22  GLUCOSE 1,593*   < > 1,085*  --    < > 224* 252*  --   --  88  --   --  148* 112*  BUN 68*   < > 60*  --    < > 40* 30*  --   --  26*  --   --  23 15  CREATININE 3.46*   < > 2.76*  --    < > 1.76* 1.49*  --   --  1.33*  --   --  1.42* 1.19  CALCIUM 8.4*   < > 8.5*  --    < > 8.6* 8.6*  --   --  7.8*  --   --  7.0* 6.8*  AST 21  --   --   --   --   --  22  --   --  24  --   --  28 28  ALT 25  --   --   --   --   --  21  --   --  18  --   --  22 22  ALKPHOS 111  --   --   --   --   --  71  --   --  60  --   --  51 48  BILITOT 1.1  --   --   --   --   --  0.6  --    --  1.2  --   --  1.0 0.7  ALBUMIN 3.7  --   --   --   --   --  2.9*  --   --  2.4*  --   --  2.4* 1.9*  MG  --   --   --  2.4  --   --  1.6*  --   --  2.1  --   --  1.7 2.0  CRP  --   --   --   --   --   --   --   --   --   --  18.7*  --  19.5*  --   DDIMER  --   --   --   --   --   --   --   --   --   --  6.43*  --  7.14*  --   PROCALCITON  --   --   --   --   --   --  1.44  --   --  0.86  --   --  0.57 1.38  LATICACIDVEN  --   --   --   --   --   --   --   --   --   --  1.3 1.2  --   --   TSH  --   --  0.636  --   --   --   --   --   --   --   --   --   --   --   HGBA1C  --   --  >15.5*  --    < >  --   --   --    < > >15.5*  --   --   --   --   BNP  --   --   --   --   --   --   --  315.9*  --   --   --   --   --   --    < > = values in this interval not displayed.    ------------------------------------------------------------------------------------------------------------------ No results for input(s): CHOL, HDL, LDLCALC, TRIG, CHOLHDL, LDLDIRECT in the last 72 hours.  Lab Results  Component Value Date   HGBA1C >15.5 (H) 04/19/2021    No results for input(s): TSH, T4TOTAL, T3FREE, THYROIDAB in the last 72 hours.  Invalid input(s): FREET3  ------------------------------------------------------------------------------------------------------------------ ID Labs Recent Labs  Lab 04/16/21 2043 04/16/21 2315 04/17/21 1342 04/18/21 0052 04/19/21 0117 04/19/21 1710 04/19/21 1943 04/20/21 0126 04/21/21 0443  WBC 13.1*  --   --  9.2 8.5  --   --  4.1 6.4  PLT 187  --   --  132* 101*  --   --  96* 91*  CRP  --   --   --   --   --  18.7*  --  19.5*  --   DDIMER  --   --   --   --   --  6.43*  --  7.14*  --   PROCALCITON  --   --   --  1.44 0.86  --   --  0.57 1.38  LATICACIDVEN  --   --   --   --   --  1.3 1.2  --   --   CREATININE 3.46*   < > 1.76* 1.49* 1.33*  --   --  1.42* 1.19   < > = values in this interval not displayed.   Cardiac Enzymes No results for input(s):  CKMB, TROPONINI, MYOGLOBIN in the last 168 hours.  Invalid input(s): CK  Radiology Reports CT ABDOMEN PELVIS WO CONTRAST  Result Date: 04/19/2021 CLINICAL DATA:  66 year old male with acute abdominal and pelvic pain. EXAM: CT ABDOMEN AND PELVIS WITHOUT CONTRAST TECHNIQUE: Multidetector CT imaging of the abdomen and pelvis was performed following the standard protocol without IV contrast. COMPARISON:  05/01/2008 CT FINDINGS: Unenhanced CT was performed per clinician order. Lack of IV contrast limits sensitivity and specificity, especially for evaluation of vascular structures and abdominal/pelvic solid viscera. Lower chest: Mild bilateral LOWER lobe cylindrical bronchiectasis noted with minimal tree in bud opacities in the LEFT LOWER lobe. Coronary artery atherosclerotic calcifications are present. Hepatobiliary: No hepatic or gallbladder abnormalities are identified. No biliary dilatation noted. Pancreas:  Unremarkable Spleen: Unremarkable Adrenals/Urinary Tract: The kidneys, adrenal glands and bladder are unremarkable. Stomach/Bowel: Mildly distended loops of fluid-filled small bowel are noted without transition point. There is mild circumferential wall thickening of the distal ileum to the cecum with adjacent inflammation and small amount of free fluid. No other areas of bowel wall thickening are noted. No evidence of pneumoperitoneum, pneumatosis intestinalis or portal venous gas. The appendix appears normal. Vascular/Lymphatic: Heavy arterial vascular calcifications are noted including aortic atherosclerosis. Heavy SMA and IMA calcifications are identified. No enlarged lymph nodes are identified. Reproductive: Prostate is unremarkable. Other: Small amount of free fluid is noted within the pelvis, RIGHT LOWER quadrant and adjacent to the liver and spleen. Musculoskeletal: No acute or suspicious bony abnormalities are noted. IMPRESSION: 1. Mild circumferential wall thickening of the distal ileum with  adjacent inflammation and small amount of free fluid. This is nonspecific but may represent an infectious or inflammatory enteritis. Given very heavy mesenteric arterial calcifications, ischemia is not entirely excluded but no evidence of pneumatosis intestinalis or pneumoperitoneum. Mildly distended loops of fluid-filled small bowel proximally without transition point, which may represent a mild ileus or low-grade partial obstruction at the distal ileum. 2. Mild bilateral LOWER lobe cylindrical bronchiectasis with minimal tree in bud opacities in the LEFT LOWER lobe, likely representing a mild infectious or inflammatory process. 3. Coronary artery disease and aortic Atherosclerosis (ICD10-I70.0). Electronically Signed   By: Margarette Canada M.D.   On: 04/19/2021 11:00   DG Chest Port 1 View  Result Date: 04/18/2021 CLINICAL DATA:  Shortness of breath. EXAM: PORTABLE CHEST 1 VIEW COMPARISON:  04/16/2021 FINDINGS: The heart size and mediastinal contours are within normal limits. Both lungs are clear. The visualized skeletal structures are unremarkable. IMPRESSION: No active disease. Electronically Signed   By: Kerby Moors M.D.   On: 04/18/2021 08:21   CT Angio Abd/Pel w/ and/or w/o  Result Date: 04/19/2021 CLINICAL DATA:  Mesenteric ischemia EXAM: CTA ABDOMEN AND PELVIS WITHOUT AND WITH CONTRAST TECHNIQUE: Multidetector CT imaging of the abdomen and pelvis was performed using the standard protocol during bolus administration of intravenous contrast. Multiplanar reconstructed images and MIPs were obtained and reviewed to evaluate the vascular anatomy. RADIATION DOSE REDUCTION: This exam was performed according to the departmental dose-optimization program which includes automated exposure control, adjustment of the mA and/or kV according to patient size and/or use of iterative reconstruction technique. CONTRAST:  15m OMNIPAQUE IOHEXOL 350 MG/ML SOLN COMPARISON:  04/19/2021 FINDINGS: VASCULAR Normal contour and  caliber of the abdominal aorta. No evidence of aneurysm, dissection, or other acute aortic pathology. Duplicated right renal arteries, with a small accessory inferior pole artery and a stent of the dominant main renal artery. Solitary left renal artery. Otherwise standard branching pattern of the abdominal aorta. Moderate mixed calcific atherosclerosis. Mixed atherosclerosis of the branch vessel origins as well as the first and second order branch vessels of the superior mesenteric artery without high-grade stenosis. Review of the MIP images confirms the above findings. NON-VASCULAR Lower chest: Coronary artery calcifications. Scattered centrilobular nodular and ground-glass opacity of the left lung base (series 8, image 18). Hepatobiliary: No solid liver abnormality is seen. Hepatic steatosis. No gallstones, gallbladder wall thickening, or biliary dilatation. Pancreas: Unremarkable. No pancreatic ductal dilatation or surrounding inflammatory changes. Spleen: Normal in size without significant abnormality. Adrenals/Urinary Tract: Adrenal glands are unremarkable. Kidneys are normal, without renal calculi, solid lesion, or hydronephrosis. Bladder is unremarkable. Stomach/Bowel: Stomach is within normal limits. Appendix appears normal. Wall thickening and adjacent inflammatory  fat stranding of the cecal base and terminal ileum, involving approximately 20 cm of the terminal ileum (series 11, image 71). Lymphatic: No enlarged abdominal or pelvic lymph nodes. Reproductive: No mass or other significant abnormality. Other: No abdominal wall hernia or abnormality. Small volume perisplenic and pelvic ascites. Musculoskeletal: No acute osseous findings. IMPRESSION: 1. Normal contour and caliber of the abdominal aorta. No evidence of aneurysm, dissection, or other acute aortic pathology. 2. Moderate mixed calcific atherosclerosis without high-grade stenosis of the branch vessel origins as well as the first and second order  branch vessels of the superior mesenteric artery without occlusion or high-grade stenosis. 3. Stent at the origin of the dominant right renal artery. 4. Wall thickening and adjacent inflammatory fat stranding of the cecal base and terminal ileum, involving approximately 20 cm of the terminal ileum, consistent with nonspecific infectious or inflammatory terminal ileitis, which may be of infectious, inflammatory, or ischemic etiology. Crohn's disease is a substantial differential consideration given this appearance and distribution. 5. Scattered centrilobular nodular and ground-glass opacity of the left lung base, consistent with nonspecific infectious or aspiration. 6. Hepatic steatosis. 7. Small volume ascites. Electronically Signed   By: Delanna Ahmadi M.D.   On: 04/19/2021 18:46

## 2021-04-21 NOTE — TOC Transition Note (Signed)
Transition of Care [facility]) - CM/SW Discharge Note   Patient Details  Name: [name] MRN: [id number] Date of Birth: [birth year]  [facility] CM/SW Contact:  Cyndi Bender, RN Phone Number: 04/21/2021, 4:00 PM   Clinical Narrative:    Spoke to patient regarding transition needs. Patient lives with his wife and son. His son can help get him to apts. DM coordinator is following him for his newly diagnosed DM. HH-PT/OT ordered. Patient offered choice. Patient's 1st choice is [place]. Spoke to [place] with [place] and referral accepted. Order for 3&1. Patient agreeable to use in house provider for DME.  Address, Phone number and PCP verified.   TOC will continue to follow     Barriers to Discharge: Continued Medical Work up   Patient Goals and CMS Choice Patient states their goals for this hospitalization and ongoing recovery are:: return home CMS Medicare.gov Compare Post Acute Care list provided to:: Patient Choice offered to / list presented to : Patient  Discharge Placement               Home with [facility]        Discharge Plan and Services   Discharge Planning Services: CM Consult Post Acute Care Choice: Home Health, Durable Medical Equipment          DME Arranged: 3-N-1 DME Agency: [employer] Date DME Agency Contacted: 04/21/21 Time DME Agency Contacted: 1021 Representative spoke with at DME Agency: Freda Munro HH Arranged: PT, OT [facility] Agency: [place] Date [place]: 04/21/21 Time [place]: 1410 Representative spoke with at [place]: [place] ([place]) Interventions     Readmission Risk Interventions No flowsheet data found.

## 2021-04-21 NOTE — Progress Notes (Signed)
ANTICOAGULATION CONSULT NOTE -follow up  Pharmacy Consult for heparin Indication: AF  Allergies  Allergen Reactions   Vicodin [Hydrocodone-Acetaminophen] Nausea And Vomiting    Patient Measurements: Height: 6' (182.9 cm) Weight: 81.6 kg (180 lb) IBW/kg (Calculated) : 77.6 Heparin dosing weight:  =TBW 81.6 kg   Vital Signs: Temp: 97.6 F (36.4 C) (01/24 1200) Temp Source: Oral (01/24 1200) BP: 126/76 (01/24 1200) Pulse Rate: 79 (01/24 1200)  Labs: Recent Labs    04/19/21 0117 04/20/21 0126 04/20/21 1857 04/21/21 0443 04/21/21 1214  HGB 12.7* 13.0  --  10.6*  --   HCT 34.9* 37.2*  --  30.3*  --   PLT 101* 96*  --  91*  --   APTT  --   --  39* 76* 77*  HEPARINUNFRC  --   --  0.69 0.59 0.53  CREATININE 1.33* 1.42*  --  1.19  --      Estimated Creatinine Clearance: 67 mL/min (by C-G formula based on SCr of 1.19 mg/dL).   Medical History: Past Medical History:  Diagnosis Date   AKI (acute kidney injury) ([place]) 12/2015   Anxiety    Arthritis    Carotid artery stenosis    Coronary artery disease    Dementia ([place])    problems with short term memory   Depression    DKA (diabetic ketoacidosis) ([place]) 04/16/2021   Dysrhythmia    episode of afib 12/2005 in setting of cocaine use   Gout    Hyperlipidemia    Hypertension    Myocardial infarction [facility])    2017   PAD (peripheral artery disease) ([facility])    Stroke ([facility])    TIA   TIA (transient ischemic attack)    over 20 years ago    Assessment: 66yo male had Eliquis for h/o Afib reversed for urgent ileocecectomy/ex-lap. Plan to bridge with IV heparin for 2-3 days before resuming apixaban.   Heparin and PTT are correlating now. It's mostly in range. We will monitor with HL from here on out.   Hgb 10.6, plt down to <100k now.  Goal of Therapy:  Heparin level: 0.3-0.5 Monitor platelets by anticoagulation protocol: Yes   Plan:  Continue Heparin at 1350 units/hr Daily HL and CBC.  Watch pltc. F/u resume  apixaban   Onnie Boer, PharmD, BCIDP, AAHIVP, CPP Infectious Disease Pharmacist 04/21/2021 1:40 PM

## 2021-04-21 NOTE — Progress Notes (Signed)
Progress Note  2 Days Post-Op  Subjective: Just mobilized with PT and did well. Denies dizziness, palpitations, light-headedness with mobilization.  Rates pain 8/10 with movement - incisional. Denies abd distention, nausea, or vomiting. +flatus, no BM. Foley in place.    Objective: Vital signs in last 24 hours: Temp:  [97.8 F (36.6 C)-98.7 F (37.1 C)] 97.8 F (36.6 C) (01/24 0739) Pulse Rate:  [66-90] 78 (01/24 0739) Resp:  [14-20] 19 (01/24 0739) BP: (89-135)/(56-82) 133/69 (01/24 0739) SpO2:  [95 %-97 %] 97 % (01/24 0955) Last BM Date: 04/19/21  Intake/Output from previous day: 01/23 0701 - 01/24 0700 In: 735.4 [I.V.:502.7; IV Piggyback:232.7] Out: 2150 [Urine:2150] Intake/Output this shift: No intake/output data recorded.  PE: General: pleasant, WD, male who is laying in bed in NAD HEENT: head is normocephalic, atraumatic. Mouth is pink and moist Heart: regular, rate, and rhythm.  Lungs: Respiratory effort nonlabored  Abd: soft, ND, present but hypoactive BS. Midline wound with honeycomb bandage c/d/I with scant blood at distal border at site of penrose drain. Staples intact. Appropriately tender, no peritonitis.  MSK: all 4 extremities are symmetrical with no cyanosis, clubbing, or edema. Skin: warm and dry Psych: A&Ox3 with an appropriate affect.    Lab Results:  Recent Labs    04/20/21 0126 04/21/21 0443  WBC 4.1 6.4  HGB 13.0 10.6*  HCT 37.2* 30.3*  PLT 96* 91*   BMET Recent Labs    04/20/21 0126 04/21/21 0443  NA 136 138  K 3.5 3.8  CL 105 109  CO2 16* 22  GLUCOSE 148* 112*  BUN 23 15  CREATININE 1.42* 1.19  CALCIUM 7.0* 6.8*   PT/INR No results for input(s): LABPROT, INR in the last 72 hours. CMP     Component Value Date/Time   NA 138 04/21/2021 0443   NA 141 07/11/2019 0812   K 3.8 04/21/2021 0443   CL 109 04/21/2021 0443   CO2 22 04/21/2021 0443   GLUCOSE 112 (H) 04/21/2021 0443   BUN 15 04/21/2021 0443   BUN 15 07/11/2019  0812   CREATININE 1.19 04/21/2021 0443   CREATININE 1.25 10/09/2020 1556   CALCIUM 6.8 (L) 04/21/2021 0443   PROT 4.8 (L) 04/21/2021 0443   PROT 6.7 07/11/2019 0811   ALBUMIN 1.9 (L) 04/21/2021 0443   ALBUMIN 4.1 07/11/2019 0811   AST 28 04/21/2021 0443   ALT 22 04/21/2021 0443   ALKPHOS 48 04/21/2021 0443   BILITOT 0.7 04/21/2021 0443   BILITOT 0.3 07/11/2019 0811   GFRNONAA >60 04/21/2021 0443   GFRAA 87 07/11/2019 0812   Lipase     Component Value Date/Time   LIPASE 85 (H) 04/19/2021 0117       Studies/Results: CT ABDOMEN PELVIS WO CONTRAST  Result Date: 04/19/2021 CLINICAL DATA:  66 year old male with acute abdominal and pelvic pain. EXAM: CT ABDOMEN AND PELVIS WITHOUT CONTRAST TECHNIQUE: Multidetector CT imaging of the abdomen and pelvis was performed following the standard protocol without IV contrast. COMPARISON:  05/01/2008 CT FINDINGS: Unenhanced CT was performed per clinician order. Lack of IV contrast limits sensitivity and specificity, especially for evaluation of vascular structures and abdominal/pelvic solid viscera. Lower chest: Mild bilateral LOWER lobe cylindrical bronchiectasis noted with minimal tree in bud opacities in the LEFT LOWER lobe. Coronary artery atherosclerotic calcifications are present. Hepatobiliary: No hepatic or gallbladder abnormalities are identified. No biliary dilatation noted. Pancreas: Unremarkable Spleen: Unremarkable Adrenals/Urinary Tract: The kidneys, adrenal glands and bladder are unremarkable. Stomach/Bowel: Mildly distended loops of  fluid-filled small bowel are noted without transition point. There is mild circumferential wall thickening of the distal ileum to the cecum with adjacent inflammation and small amount of free fluid. No other areas of bowel wall thickening are noted. No evidence of pneumoperitoneum, pneumatosis intestinalis or portal venous gas. The appendix appears normal. Vascular/Lymphatic: Heavy arterial vascular  calcifications are noted including aortic atherosclerosis. Heavy SMA and IMA calcifications are identified. No enlarged lymph nodes are identified. Reproductive: Prostate is unremarkable. Other: Small amount of free fluid is noted within the pelvis, RIGHT LOWER quadrant and adjacent to the liver and spleen. Musculoskeletal: No acute or suspicious bony abnormalities are noted. IMPRESSION: 1. Mild circumferential wall thickening of the distal ileum with adjacent inflammation and small amount of free fluid. This is nonspecific but may represent an infectious or inflammatory enteritis. Given very heavy mesenteric arterial calcifications, ischemia is not entirely excluded but no evidence of pneumatosis intestinalis or pneumoperitoneum. Mildly distended loops of fluid-filled small bowel proximally without transition point, which may represent a mild ileus or low-grade partial obstruction at the distal ileum. 2. Mild bilateral LOWER lobe cylindrical bronchiectasis with minimal tree in bud opacities in the LEFT LOWER lobe, likely representing a mild infectious or inflammatory process. 3. Coronary artery disease and aortic Atherosclerosis (ICD10-I70.0). Electronically Signed   By: Margarette Canada M.D.   On: 04/19/2021 11:00   CT Angio Abd/Pel w/ and/or w/o  Result Date: 04/19/2021 CLINICAL DATA:  Mesenteric ischemia EXAM: CTA ABDOMEN AND PELVIS WITHOUT AND WITH CONTRAST TECHNIQUE: Multidetector CT imaging of the abdomen and pelvis was performed using the standard protocol during bolus administration of intravenous contrast. Multiplanar reconstructed images and MIPs were obtained and reviewed to evaluate the vascular anatomy. RADIATION DOSE REDUCTION: This exam was performed according to the departmental dose-optimization program which includes automated exposure control, adjustment of the mA and/or kV according to patient size and/or use of iterative reconstruction technique. CONTRAST:  174mL OMNIPAQUE IOHEXOL 350 MG/ML SOLN  COMPARISON:  04/19/2021 FINDINGS: VASCULAR Normal contour and caliber of the abdominal aorta. No evidence of aneurysm, dissection, or other acute aortic pathology. Duplicated right renal arteries, with a small accessory inferior pole artery and a stent of the dominant main renal artery. Solitary left renal artery. Otherwise standard branching pattern of the abdominal aorta. Moderate mixed calcific atherosclerosis. Mixed atherosclerosis of the branch vessel origins as well as the first and second order branch vessels of the superior mesenteric artery without high-grade stenosis. Review of the MIP images confirms the above findings. NON-VASCULAR Lower chest: Coronary artery calcifications. Scattered centrilobular nodular and ground-glass opacity of the left lung base (series 8, image 18). Hepatobiliary: No solid liver abnormality is seen. Hepatic steatosis. No gallstones, gallbladder wall thickening, or biliary dilatation. Pancreas: Unremarkable. No pancreatic ductal dilatation or surrounding inflammatory changes. Spleen: Normal in size without significant abnormality. Adrenals/Urinary Tract: Adrenal glands are unremarkable. Kidneys are normal, without renal calculi, solid lesion, or hydronephrosis. Bladder is unremarkable. Stomach/Bowel: Stomach is within normal limits. Appendix appears normal. Wall thickening and adjacent inflammatory fat stranding of the cecal base and terminal ileum, involving approximately 20 cm of the terminal ileum (series 11, image 71). Lymphatic: No enlarged abdominal or pelvic lymph nodes. Reproductive: No mass or other significant abnormality. Other: No abdominal wall hernia or abnormality. Small volume perisplenic and pelvic ascites. Musculoskeletal: No acute osseous findings. IMPRESSION: 1. Normal contour and caliber of the abdominal aorta. No evidence of aneurysm, dissection, or other acute aortic pathology. 2. Moderate mixed calcific atherosclerosis without high-grade stenosis  of the  branch vessel origins as well as the first and second order branch vessels of the superior mesenteric artery without occlusion or high-grade stenosis. 3. Stent at the origin of the dominant right renal artery. 4. Wall thickening and adjacent inflammatory fat stranding of the cecal base and terminal ileum, involving approximately 20 cm of the terminal ileum, consistent with nonspecific infectious or inflammatory terminal ileitis, which may be of infectious, inflammatory, or ischemic etiology. Crohn's disease is a substantial differential consideration given this appearance and distribution. 5. Scattered centrilobular nodular and ground-glass opacity of the left lung base, consistent with nonspecific infectious or aspiration. 6. Hepatic steatosis. 7. Small volume ascites. Electronically Signed   By: Delanna Ahmadi M.D.   On: 04/19/2021 18:46    Anti-infectives: Anti-infectives (From admission, onward)    Start     Dose/Rate Route Frequency Ordered Stop   04/20/21 0200  piperacillin-tazobactam (ZOSYN) IVPB 3.375 g  Status:  Discontinued        3.375 g 12.5 mL/hr over 240 Minutes Intravenous Every 8 hours 04/19/21 1649 04/20/21 0105   04/20/21 0200  piperacillin-tazobactam (ZOSYN) IVPB 3.375 g        3.375 g 12.5 mL/hr over 240 Minutes Intravenous Every 8 hours 04/20/21 0105 04/24/21 0559   04/20/21 0000  piperacillin-tazobactam (ZOSYN) IVPB 3.375 g  Status:  Discontinued        3.375 g 100 mL/hr over 30 Minutes Intravenous Every 6 hours 04/19/21 2334 04/20/21 0105   04/19/21 1745  piperacillin-tazobactam (ZOSYN) IVPB 3.375 g        3.375 g 100 mL/hr over 30 Minutes Intravenous  Once 04/19/21 1649 04/19/21 1910        Assessment/Plan  Ischemic bowel, possibly secondary to embolic event causing acute mesenteric ischemia. POD2 s/p dx lap converted to ex lap with ileocecectomy - Dr. Thermon Leyland 1/22 - afebrile, VSS  - having flatus, no BM, ok to start CLD - hep gtt started 1/23 - ok to DC foley  - good UOP, urine clear, AKI resolved. I will write order to D/C. - PT/OT   FEN: CLD  ID: zosyn 1/23> VTE: hep gtt 1/23 >> platelets 90K  today, monitor   Per primary: T2DM with DKA AKI Afib on eliquis baseline HTN     LOS: 5 days   Jill Alexanders, [facility] 04/21/2021, 10:09 AM Please see Amion for pager number during day hours 7:00am-4:30pm

## 2021-04-21 NOTE — Progress Notes (Signed)
ANTICOAGULATION CONSULT NOTE -follow up  Pharmacy Consult for heparin Indication: AF  Allergies  Allergen Reactions   Vicodin [Hydrocodone-Acetaminophen] Nausea And Vomiting    Patient Measurements: Height: 6' (182.9 cm) Weight: 81.6 kg (180 lb) IBW/kg (Calculated) : 77.6 Heparin dosing weight:  =TBW 81.6 kg   Vital Signs: Temp: 97.8 F (36.6 C) (01/24 0411) Temp Source: Oral (01/24 0411) BP: 89/56 (01/24 0411) Pulse Rate: 66 (01/24 0411)  Labs: Recent Labs    04/19/21 0117 04/20/21 0126 04/20/21 1857 04/21/21 0443  HGB 12.7* 13.0  --  10.6*  HCT 34.9* 37.2*  --  30.3*  PLT 101* 96*  --  91*  APTT  --   --  39* 76*  HEPARINUNFRC  --   --  0.69 0.59  CREATININE 1.33* 1.42*  --  1.19     Estimated Creatinine Clearance: 67 mL/min (by C-G formula based on SCr of 1.19 mg/dL).   Medical History: Past Medical History:  Diagnosis Date   AKI (acute kidney injury) ([place]) 12/2015   Anxiety    Arthritis    Carotid artery stenosis    Coronary artery disease    Dementia ([place])    problems with short term memory   Depression    DKA (diabetic ketoacidosis) ([place]) 04/16/2021   Dysrhythmia    episode of afib 12/2005 in setting of cocaine use   Gout    Hyperlipidemia    Hypertension    Myocardial infarction [facility])    2017   PAD (peripheral artery disease) ([facility])    Stroke ([facility])    TIA   TIA (transient ischemic attack)    over 20 years ago    Assessment: 66yo male had Eliquis for h/o Afib reversed for urgent ileocecectomy/ex-lap. Plan to bridge with IV heparin for 2-3 days before resuming apixaban.   An aPTT this AM is therapeutic on 1350 units/hr of IV Heparin and is not precisely correlating with heparin level yet. H/H down, Plt slowly trending down. RN reports no s/s of bleeding   Goal of Therapy:  Heparin level: 0.3-0.5 aPTT: 66-85 Monitor platelets by anticoagulation protocol: Yes   Plan:  Continue Heparin at 1350 units/hr F/u 6 hr confirmatory aPTT/HL   Daily PTT, HL and CBC.  Watch pltc. F/u resume apixaban   Thank you for allowing pharmacy to be part of this patients care team.  Albertina Parr, PharmD., BCCCP Clinical Pharmacist Please refer to [facility] for unit-specific pharmacist

## 2021-04-21 NOTE — Progress Notes (Signed)
Physical Therapy Treatment Patient Details Name: [name] MRN: [id number] DOB: [birth year] Today's Date: 04/21/2021   History of Present Illness 66 y/o male admitted 1/19 with weakness, increased urination and tremors.  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 PMH includes: anxiety, AKI, CAD, dementia, DKA, CVA, TIA.    PT Comments    Pt pleasant, in chair on arrival and able to progress gait and perform stairs. PT with abdominal pain with education for splinting with coughing and cautious with all mobility due to pain. Pt educated for walking program and progressive activity.    97% RA HR 88   Recommendations for follow up therapy are one component of a multi-disciplinary discharge planning process, led by the attending physician.  Recommendations may be updated based on patient status, additional functional criteria and insurance authorization.  Follow Up Recommendations  Home health PT     Assistance Recommended at Discharge Intermittent Supervision/Assistance  Patient can return home with the following A little help with bathing/dressing/bathroom;Assistance with cooking/housework   Equipment Recommendations  None recommended by PT    Recommendations for Other Services       Precautions / Restrictions Precautions Precautions: Fall;Other (comment) Precaution Comments: abdominal incision     Mobility  Bed Mobility               General bed mobility comments: in chair on arrival and end of session    Transfers Overall transfer level: Needs assistance   Transfers: Sit to/from Stand Sit to Stand: Supervision           General transfer comment: supervision for lines and safety    Ambulation/Gait Ambulation/Gait assistance: Supervision Gait Distance (Feet): 200 Feet Assistive device: None Gait Pattern/deviations: Step-through pattern, Decreased stride length   Gait velocity interpretation:  1.31 - 2.62 ft/sec, indicative of limited community ambulator   General Gait Details: pt with slow cautious gait with decreased speed and cues for increased stride   Stairs Stairs: Yes Stairs assistance: Supervision Stair Management: Alternating pattern, Forwards, One rail Right Number of Stairs: 4 General stair comments: pt with alternating gait with use of rail and supervision for lines   Wheelchair Mobility    Modified Rankin (Stroke Patients Only)       Balance Overall balance assessment: Mild deficits observed, not formally tested                                          Cognition Arousal/Alertness: Awake/alert Behavior During Therapy: WFL for tasks assessed/performed Overall Cognitive Status: Within Functional Limits for tasks assessed                                          Exercises General Exercises - Lower Extremity Long Arc Quad: AROM, Both, Seated, 15 reps Hip Flexion/Marching: AROM, Both, Seated, 15 reps    General Comments        Pertinent Vitals/Pain Pain Assessment Faces Pain Scale: Hurts whole lot Pain Location: abdomen Pain Descriptors / Indicators: Discomfort, Grimacing, Guarding Pain Intervention(s): Limited activity within patient's tolerance, Monitored during session, Premedicated before session, Repositioned    Home Living  Prior Function            PT Goals (current goals can now be found in the care plan section) Progress towards PT goals: Progressing toward goals    Frequency    Min 3X/week      PT Plan Current plan remains appropriate    Co-evaluation              AM-PAC PT "6 Clicks" Mobility   Outcome Measure  Help needed turning from your back to your side while in a flat bed without using bedrails?: None Help needed moving from lying on your back to sitting on the side of a flat bed without using bedrails?: A Little Help needed moving to  and from a bed to a chair (including a wheelchair)?: A Little Help needed standing up from a chair using your arms (e.g., wheelchair or bedside chair)?: A Little Help needed to walk in hospital room?: A Little Help needed climbing 3-5 steps with a railing? : A Little 6 Click Score: 19    End of Session   Activity Tolerance: Patient tolerated treatment well Patient left: in chair;with call bell/phone within reach;with chair alarm set Nurse Communication: Mobility status PT Visit Diagnosis: Unsteadiness on feet (R26.81);Muscle weakness (generalized) (M62.81);Difficulty in walking, not elsewhere classified (R26.2)     Time: 9242-6834 PT Time Calculation (min) (ACUTE ONLY): 24 min  Charges:  $Gait Training: 8-22 mins $Therapeutic Exercise: 8-22 mins                     Deasiah Hagberg P, PT Acute Rehabilitation Services Pager: [phone] Office: [place] 04/21/2021, 9:59 AM

## 2021-04-21 NOTE — Progress Notes (Signed)
Inpatient Diabetes Program Recommendations  AACE/ADA: New Consensus Statement on Inpatient Glycemic Control (2015)  Target Ranges:  Prepandial:   less than 140 mg/dL      Peak postprandial:   less than 180 mg/dL (1-2 hours)      Critically ill patients:  140 - 180 mg/dL   Lab Results  Component Value Date   GLUCAP 120 (H) 04/21/2021   HGBA1C >15.5 (H) 04/19/2021    Review of Glycemic Control  Inpatient Diabetes Program Recommendations:   Spoke with pt about new diagnosis. Discussed A1C results with them and explained what an A1C is, basic pathophysiology of DM Type 2, basic home care, basic diabetes diet nutrition principles, importance of checking CBGs and maintaining good CBG control to prevent long-term and short-term complications. Reviewed signs and symptoms of hyperglycemia and hypoglycemia and how to treat hypoglycemia at home. Also reviewed blood sugar goals at home.  RNs to provide ongoing basic DM education at bedside with this patient. Have ordered educational booklet and insulin pen starter kit. Have also placed RD consult for DM diet education for this patient.   Educated patient on insulin pen use at home. Reviewed contents of insulin flexpen starter kit. Reviewed all steps if insulin pen including attachment of needle, 2-unit air shot, dialing up dose, giving injection, removing needle, disposal of sharps, storage of unused insulin, disposal of insulin etc. Patient able to provide successful return demonstration. Also reviewed troubleshooting with insulin pen. MD to give patient Rxs for insulin pens and insulin pen needles.   RN Kirk Ruths, Christen Bame to allow patient to give next insulin injection to help prepare patient if goes home on insulin.  Patient states he plans to limit sugary drinks and high carbohydrate foods to help control blood glucose.  Thank you, Nani Gasser. Aarya Quebedeaux, RN, MSN, CDE  Diabetes Coordinator Inpatient Glycemic Control Team Team Pager [phone]  (8am-5pm) 04/21/2021 2:39 PM

## 2021-04-22 ENCOUNTER — Other Ambulatory Visit (HOSPITAL_COMMUNITY): Payer: Self-pay

## 2021-04-22 LAB — CBC
HCT: 28.9 % — ABNORMAL LOW (ref 39.0–52.0)
Hemoglobin: 10 g/dL — ABNORMAL LOW (ref 13.0–17.0)
MCH: 33.1 pg (ref 26.0–34.0)
MCHC: 34.6 g/dL (ref 30.0–36.0)
MCV: 95.7 fL (ref 80.0–100.0)
Platelets: 111 10*3/uL — ABNORMAL LOW (ref 150–400)
RBC: 3.02 MIL/uL — ABNORMAL LOW (ref 4.22–5.81)
RDW: 13.3 % (ref 11.5–15.5)
WBC: 7.3 10*3/uL (ref 4.0–10.5)
nRBC: 0 % (ref 0.0–0.2)

## 2021-04-22 LAB — GLUCOSE, CAPILLARY
Glucose-Capillary: 93 mg/dL (ref 70–99)
Glucose-Capillary: 130 mg/dL — ABNORMAL HIGH (ref 70–99)
Glucose-Capillary: 181 mg/dL — ABNORMAL HIGH (ref 70–99)
Glucose-Capillary: 198 mg/dL — ABNORMAL HIGH (ref 70–99)
Glucose-Capillary: 118 mg/dL — ABNORMAL HIGH (ref 70–99)

## 2021-04-22 LAB — COMPREHENSIVE METABOLIC PANEL
ALT: 23 U/L (ref 0–44)
AST: 31 U/L (ref 15–41)
Albumin: 1.8 g/dL — ABNORMAL LOW (ref 3.5–5.0)
Alkaline Phosphatase: 80 U/L (ref 38–126)
Anion gap: 6 (ref 5–15)
BUN: 11 mg/dL (ref 8–23)
CO2: 26 mmol/L (ref 22–32)
Calcium: 7.3 mg/dL — ABNORMAL LOW (ref 8.9–10.3)
Chloride: 106 mmol/L (ref 98–111)
Creatinine, Ser: 1.22 mg/dL (ref 0.61–1.24)
GFR, Estimated: >60 mL/min (ref >60)
Glucose, Bld: 90 mg/dL (ref 70–99)
Potassium: 3.9 mmol/L (ref 3.5–5.1)
Sodium: 138 mmol/L (ref 135–145)
Total Bilirubin: 0.4 mg/dL (ref 0.3–1.2)
Total Protein: 4.7 g/dL — ABNORMAL LOW (ref 6.5–8.1)

## 2021-04-22 LAB — HEPARIN LEVEL (UNFRACTIONATED): Heparin Unfractionated: 0.44 IU/mL (ref 0.30–0.70)

## 2021-04-22 LAB — SURGICAL PATHOLOGY

## 2021-04-22 LAB — PROCALCITONIN: Procalcitonin: 0.92 ng/mL

## 2021-04-22 LAB — MAGNESIUM: Magnesium: 1.8 mg/dL (ref 1.7–2.4)

## 2021-04-22 NOTE — Progress Notes (Signed)
ANTICOAGULATION CONSULT NOTE -follow up  Pharmacy Consult for heparin Indication: AF  Allergies  Allergen Reactions   Vicodin [Hydrocodone-Acetaminophen] Nausea And Vomiting    Patient Measurements: Height: 6' (182.9 cm) Weight: 81.6 kg (180 lb) IBW/kg (Calculated) : 77.6 Heparin dosing weight:  =TBW 81.6 kg   Vital Signs: Temp: 98 F (36.7 C) (01/25 0846) Temp Source: Oral (01/25 0846) BP: 151/71 (01/25 0846) Pulse Rate: 70 (01/25 0909)  Labs: Recent Labs    04/20/21 0126 04/20/21 1857 04/20/21 1857 04/21/21 0443 04/21/21 1214 04/22/21 0225  HGB 13.0  --   --  10.6*  --  10.0*  HCT 37.2*  --   --  30.3*  --  28.9*  PLT 96*  --   --  91*  --  111*  APTT  --  39*  --  76* 77*  --   HEPARINUNFRC  --  0.69   < > 0.59 0.53 0.44  CREATININE 1.42*  --   --  1.19  --  1.22   < > = values in this interval not displayed.     Estimated Creatinine Clearance: 65.4 mL/min (by C-G formula based on SCr of 1.22 mg/dL).   Medical History: Past Medical History:  Diagnosis Date   AKI (acute kidney injury) ([place]) 12/2015   Anxiety    Arthritis    Carotid artery stenosis    Coronary artery disease    Dementia ([place])    problems with short term memory   Depression    DKA (diabetic ketoacidosis) ([place]) 04/16/2021   Dysrhythmia    episode of afib 12/2005 in setting of cocaine use   Gout    Hyperlipidemia    Hypertension    Myocardial infarction [facility])    2017   PAD (peripheral artery disease) ([facility])    Stroke ([facility])    TIA   TIA (transient ischemic attack)    over 20 years ago    Assessment: 66yo male had Eliquis for h/o Afib reversed for urgent ileocecectomy/ex-lap. Plan to bridge with IV heparin for 2-3 days before resuming apixaban.   Heparin level remains therapeutic. Likely to change back to apixaban today. He is also on D4 zosyn. Plan for 5d.   Hgb 10, plt down to 111k Goal of Therapy:  Heparin level: 0.3-0.5 Monitor platelets by anticoagulation protocol: Yes    Plan:  Continue Heparin at 1350 units/hr Daily HL and CBC.  Watch pltc. F/u resume apixaban Rx will sign off off zosyn  Onnie Boer, PharmD, [place], AAHIVP, CPP Infectious Disease Pharmacist 04/22/2021 10:14 AM

## 2021-04-22 NOTE — Progress Notes (Signed)
Occupational Therapy Treatment Patient Details Name: [name] MRN: [id number] DOB: [birth year] Today's Date: 04/22/2021   History of present illness 66 y/o male admitted 1/19 with weakness, increased urination and tremors.  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 PMH includes: anxiety, AKI, CAD, dementia, DKA, CVA, TIA.   OT comments  Patient received in bed and eager to participate with OT treatment. Patient was supervision to get to EOB and was able to stand at sink for self care tasks with min guard assist for safety and one extremity support for balance. Patient performed mobility in hallway with min guard assist and verbal cues for safety.  Patient is making good gains from OT treatment and appears appropriate for HHOT.    Recommendations for follow up therapy are one component of a multi-disciplinary discharge planning process, led by the attending physician.  Recommendations may be updated based on patient status, additional functional criteria and insurance authorization.    Follow Up Recommendations  Home health OT    Assistance Recommended at Discharge Frequent or constant Supervision/Assistance  Patient can return home with the following  Assistance with cooking/housework;Direct supervision/assist for medications management;Direct supervision/assist for financial management;Assist for transportation;Help with stairs or ramp for entrance;A little help with walking and/or transfers;A little help with bathing/dressing/bathroom   Equipment Recommendations  BSC/3in1;Other (comment)    Recommendations for Other Services      Precautions / Restrictions Precautions Precautions: Fall;Other (comment) Precaution Comments: abdominal incision       Mobility Bed Mobility Overal bed mobility: Needs Assistance Bed Mobility: Rolling, Sidelying to Sit Rolling: Supervision Sidelying to sit: Supervision       General bed  mobility comments: verbal cues for rail use    Transfers Overall transfer level: Needs assistance Equipment used: Rolling walker (2 wheels) Transfers: Sit to/from Stand Sit to Stand: Supervision           General transfer comment: min guard for safety     Balance Overall balance assessment: Mild deficits observed, not formally tested Sitting-balance support: No upper extremity supported, Feet supported Sitting balance-Leahy Scale: Good     Standing balance support: Bilateral upper extremity supported, Single extremity supported Standing balance-Leahy Scale: Fair Standing balance comment: able to stand at sink with one extremity support while performing self care tasks                           ADL either performed or assessed with clinical judgement   ADL Overall ADL's : Needs assistance/impaired     Grooming: Min guard;Standing;Wash/dry hands;Wash/dry face;Oral care Grooming Details (indicate cue type and reason): performed standing at sink Upper Body Bathing: Min guard;Standing Upper Body Bathing Details (indicate cue type and reason): performed standing at sink Lower Body Bathing: Moderate assistance;Sit to/from stand Lower Body Bathing Details (indicate cue type and reason): able to stand at sink and participate                     Functional mobility during ADLs: Min guard General ADL Comments: Patient demonstrated improvement with self care. Patient able to perform standing at sink with min guard assist for safety    Extremity/Trunk Assessment              Vision       Perception     Praxis      Cognition Arousal/Alertness: Awake/alert Behavior During Therapy: [facility] for tasks  assessed/performed Overall Cognitive Status: Within Functional Limits for tasks assessed Area of Impairment: Orientation, Attention, Memory, Following commands, Safety/judgement, Awareness, Problem solving                   Current Attention Level:  Sustained   Following Commands: Follows multi-step commands consistently Safety/Judgement: Decreased awareness of safety, Decreased awareness of deficits Awareness: Emergent   General Comments: able to follow multiple commands, demonstrated good safety        Exercises      Shoulder Instructions       General Comments      Pertinent Vitals/ Pain       Pain Assessment Pain Assessment: 0-10 Pain Score: 6  Faces Pain Scale: Hurts little more Pain Location: abdomen Pain Descriptors / Indicators: Discomfort, Grimacing, Guarding Pain Intervention(s): Monitored during session, Repositioned  Home Living                                          Prior Functioning/Environment              Frequency  Min 2X/week        Progress Toward Goals  OT Goals(current goals can now be found in the care plan section)  Progress towards OT goals: Progressing toward goals  Acute Rehab OT Goals Patient Stated Goal: go home OT Goal Formulation: With patient Time For Goal Achievement: 05/01/21 Potential to Achieve Goals: Good ADL Goals Pt Will Perform Grooming: with supervision;sitting;with set-up Pt Will Perform Upper Body Bathing: with set-up;sitting Pt Will Perform Lower Body Dressing: with min assist;sit to/from stand;sitting/lateral leans Pt Will Transfer to Toilet: with min guard assist;ambulating;bedside commode Pt Will Perform Toileting - Clothing Manipulation and hygiene: with min guard assist;sit to/from stand;sitting/lateral leans Pt/caregiver will Perform Home Exercise Program: Increased strength;Both right and left upper extremity;With written HEP provided Additional ADL Goal #1: Pt will follow 2 step commands with increased time and 90% accuracy.  Plan Discharge plan remains appropriate    Co-evaluation                 AM-PAC OT "6 Clicks" Daily Activity     Outcome Measure   Help from another person eating meals?: A Lot Help from  another person taking care of personal grooming?: A Little Help from another person toileting, which includes using toliet, bedpan, or urinal?: A Little Help from another person bathing (including washing, rinsing, drying)?: A Little Help from another person to put on and taking off regular upper body clothing?: A Little Help from another person to put on and taking off regular lower body clothing?: A Lot 6 Click Score: 16    End of Session Equipment Utilized During Treatment: Gait belt;Rolling walker (2 wheels)  OT Visit Diagnosis: Other abnormalities of gait and mobility (R26.89);Muscle weakness (generalized) (M62.81);Other symptoms and signs involving the nervous system (R29.898)   Activity Tolerance Patient tolerated treatment well   Patient Left in chair;with call bell/phone within reach;with chair alarm set   Nurse Communication Mobility status        Time: 4782-9562 OT Time Calculation (min): 22 min  Charges: OT General Charges $OT Visit: 1 Visit OT Treatments $Self Care/Home Management : 8-22 mins  Lodema Hong, [place]  Pager [phone] Office [place] 04/22/2021, 9:18 AM

## 2021-04-22 NOTE — Progress Notes (Signed)
PROGRESS NOTE                                                                                                                                                                                                             Patient Demographics:    [name], is a 66 y.o. male, DOB - May 26, 1955, PJS:[id number]  Outpatient Primary MD for the patient is [name], MD    LOS - 6  Admit date - 04/16/2021    Chief Complaint  Patient presents with   Hyperglycemia       Brief Narrative (HPI from H&P)     66 y.o. male with medical history significant of CAD, HTN.  He presented to the ER with about 5 to 7-day history of feeling weak with increase in urination and thirst along with some tremors.  In the ER he was diagnosed with new onset DM type II with DKA and admitted to the hospital.  He was treated for DKA and stabilized he was about to be discharged on 04/19/2021 but right before being discharged he developed acute onset of abdominal pain and was found to have new ischemic bowel he underwent laparotomy with ileocecal resection and now recovering from the surgery.   Subjective:   Patient denies any chest pain or shortness of breath, no nausea, no vomiting, reports abdominal pain is controlled.     Assessment  & Plan :    No notes have been filed under this hospital service. Service: Hospitalist   New diagnosis of DM type II with DKA on presentation - with severe dehydration - AKI, pseudohyponatremia and metabolic acidosis due to DKA.   -He was treated with DKA protocol along with aggressive IV fluids and electrolyte replacement, gap is closed he has been transitioned to Lantus and sliding scale, dose adjusted on 04/20/2021 as he is n.p.o. postop.  Continue to monitor and adjust closely. -Currently controlled on 8 units of Lantus and insulin sliding scale.  CBG (last 3)  Recent Labs    04/22/21 0137 04/22/21 0841  04/22/21 1138  GLUCAP 93 130* 181*   Lab Results  Component Value Date   HGBA1C >15.5 (H) 04/19/2021     Ischemic bowel /embolic mesenteric ischemia - patient sudden onset of periumbilical abdominal pain on 04/19/2021.  Work-up suggestive of ischemic bowel, seen by vascular surgery and  general surgery underwent emergency exploratory laparotomy with ileocecal resection on 04/19/2021.  Continue hydration, continue heparin drip.  He has plenty of risk factors which are PAD,CAD, Smoking, Afib - Heparin drip resumed on 04/20/2021 after discussions with general surgery.  On IV Zosyn, defer stop date to general surgery continue to monitor with supportive care.  Strictly counseled to quit smoking.  He is placing flatus on 04/21/2021 defer oral intake and IV antibiotics to general surgery for now continue IV fluids. -Agement per general surgery, advance to full liquid diet today, continue with antibiotics 5 days postop.  Paroxysmal atrial fibrillation.  Continue beta-blocker and heparin instead of Eliquis in the perioperative period.  Mali vas 2 score of greater than 3. -If tolerates oral today, can transition to p.o. anticoagulation  Hypertension.  Blood pressure is better on beta-blocker monitor add as needed hydralazine..  Dyslipidemia on statin and Vascepa.  AKI on CKD 3A with baseline creatinine of 1.5, AKI due to severe dehydration - holding ARB and HCTZ, has been aggressively hydrated with IV fluids and AKI seems to have resolved.  Hypokalemia - replaced.  Hypomagnesemia.  Aggressively replaced.  H/O gout.  On allopurinol.  Encouraged the patient to sit up in chair in the daytime use I-S and flutter valve for pulmonary toiletry.  Will advance activity and titrate down oxygen as possible.         Condition - Fair  Family Communication  : None at bedside  Code Status :  Full  Consults  :  None  PUD Prophylaxis : PPI   Procedures  :     Exploratory laparotomy with Ileocecectomy  - by [facility] 04/20/21   CT - 1. Mild circumferential wall thickening of the distal ileum with adjacent inflammation and small amount of free fluid. This is nonspecific but may represent an infectious or inflammatory enteritis. Given very heavy mesenteric arterial calcifications, ischemia is not entirely excluded but no evidence of pneumatosis intestinalis or pneumoperitoneum. Mildly distended loops of fluid-filled small bowel proximally without transition point, which may represent a mild ileus or low-grade partial obstruction at the distal ileum. 2. Mild bilateral LOWER lobe cylindrical bronchiectasis with minimal tree in bud opacities in the LEFT LOWER lobe, likely representing a mild infectious or inflammatory process. 3. Coronary artery disease and aortic Atherosclerosis (ICD10-I70.0).       Disposition Plan  :    Status is: Inpatient  Remains inpatient appropriate because: DKA  DVT Prophylaxis  :    Place TED hose Start: 04/18/21 1101     Lab Results  Component Value Date   PLT 111 (L) 04/22/2021    Diet :  Diet Order             Diet full liquid Room service appropriate? Yes; Fluid consistency: Thin  Diet effective now                    Inpatient Medications  Scheduled Meds:  acetaminophen  650 mg Oral Q6H   allopurinol  100 mg Oral Daily   atorvastatin  80 mg Oral QHS   Chlorhexidine Gluconate Cloth  6 each Topical Daily   docusate sodium  100 mg Oral BID   escitalopram  20 mg Oral Daily   icosapent Ethyl  2 g Oral BID   insulin aspart  0-9 Units Subcutaneous Q6H   insulin glargine-yfgn  8 Units Subcutaneous Daily   insulin starter kit- pen needles  1 kit Other Once   living well with  diabetes book   Does not apply Once   metoprolol tartrate  25 mg Oral BID   pantoprazole  40 mg Oral Daily   traZODone  100 mg Oral QHS   Continuous Infusions:  heparin 1,350 Units/hr (04/22/21 0259)   lactated ringers with kcl 75 mL/hr at 04/22/21 0439   methocarbamol  (ROBAXIN) IV     piperacillin-tazobactam (ZOSYN)  IV 3.375 g (04/22/21 0528)    PRN Meds:.busPIRone, dextrose, hydrALAZINE, HYDROcodone-acetaminophen, HYDROmorphone (DILAUDID) injection, hydrOXYzine, methocarbamol (ROBAXIN) IV, metoprolol tartrate, morphine injection, ondansetron (ZOFRAN) IV, oxyCODONE, oxyCODONE, prochlorperazine, simethicone  Antibiotics  :    Anti-infectives (From admission, onward)    Start     Dose/Rate Route Frequency Ordered Stop   04/20/21 0200  piperacillin-tazobactam (ZOSYN) IVPB 3.375 g  Status:  Discontinued        3.375 g 12.5 mL/hr over 240 Minutes Intravenous Every 8 hours 04/19/21 1649 04/20/21 0105   04/20/21 0200  piperacillin-tazobactam (ZOSYN) IVPB 3.375 g        3.375 g 12.5 mL/hr over 240 Minutes Intravenous Every 8 hours 04/20/21 0105 04/24/21 0559   04/20/21 0000  piperacillin-tazobactam (ZOSYN) IVPB 3.375 g  Status:  Discontinued        3.375 g 100 mL/hr over 30 Minutes Intravenous Every 6 hours 04/19/21 2334 04/20/21 0105   04/19/21 1745  piperacillin-tazobactam (ZOSYN) IVPB 3.375 g        3.375 g 100 mL/hr over 30 Minutes Intravenous  Once 04/19/21 1649 04/19/21 1910      \   Emeline Gins Samai Corea M.D on 04/22/2021 at 1:06 PM  To page go to [email or web address]   [facility] -  Office  [phone]  See all Orders from today for further details    Objective:   Vitals:   04/22/21 0438 04/22/21 0846 04/22/21 0909 04/22/21 1132  BP: 114/73 (!) 151/71  130/70  Pulse: 64 69 70 62  Resp: _0 Temp: (!) 97.5 F (36.4 C) 98 F (36.7 C)  (!) 97.4 F (36.3 C)  TempSrc: Oral Oral  Oral  SpO2: 94% 93%  99%  Weight:      Height:        Wt Readings from Last 3 Encounters:  04/16/21 81.6 kg  03/12/21 85.3 kg  09/09/20 86.8 kg     Intake/Output Summary (Last 24 hours) at 04/22/2021 1306 Last data filed at 04/22/2021 0600 Gross per 24 hour  Intake 2622.94 ml  Output 1200 ml  Net 1422.94 ml     Physical Exam  Awake  Alert, Oriented X 3, No new F.N deficits, Normal affect Symmetrical Chest wall movement, Good air movement bilaterally, CTAB RRR,No Gallops,Rubs or new Murmurs, No Parasternal Heave Bowel sounds present, midline surgical wound covered with honey, mesh. No Cyanosis, Clubbing or edema, No new Rash or bruise       Data Review:    CBC Recent Labs  Lab 04/16/21 2043 04/16/21 2051 04/18/21 0052 04/19/21 0117 04/20/21 0126 04/21/21 0443 04/22/21 0225  WBC 13.1*  --  9.2 8.5 4.1 6.4 7.3  HGB 14.5   < > 15.0 12.7* 13.0 10.6* 10.0*  HCT 45.4   < > 40.4 34.9* 37.2* 30.3* 28.9*  PLT 187  --  132* 101* 96* 91* 111*  MCV 104.1*  --  88.4 90.6 93.9 95.0 95.7  MCH 33.3  --  32.8 33.0 32.8 33.2 33.1  MCHC 31.9  --  37.1* 36.4* 34.9 35.0 34.6  RDW 12.7  --  12.7 12.6 13.0 13.3 13.3  LYMPHSABS 1.1  --  1.0 1.8  --   --   --   MONOABS 0.9  --  0.9 0.8  --   --   --   EOSABS 0.0  --  0.0 0.0  --   --   --   BASOSABS 0.0  --  0.0 0.0  --   --   --    < > = values in this interval not displayed.    Electrolytes Recent Labs  Lab 04/17/21 0211 04/17/21 0656 04/18/21 0052 04/18/21 0729 04/18/21 1431 04/19/21 0117 04/19/21 1710 04/19/21 1943 04/20/21 0126 04/21/21 0443 04/22/21 0225  NA 127*   < > 137  --   --  136  --   --  136 138 138  K 4.2   < > 3.3*  --   --  3.0*  --   --  3.5 3.8 3.9  CL 93*   < > 104  --   --  103  --   --  105 109 106  CO2 18*   < > 24  --   --  23  --   --  16* 22 26  GLUCOSE 1,085*   < > 252*  --   --  88  --   --  148* 112* 90  BUN 60*   < > 30*  --   --  26*  --   --  _0 CREATININE 2.76*   < > 1.49*  --   --  1.33*  --   --  1.42* 1.19 1.22  CALCIUM 8.5*   < > 8.6*  --   --  7.8*  --   --  7.0* 6.8* 7.3*  AST  --   --  22  --   --  24  --   --  _1 ALT  --   --  21  --   --  18  --   --  _2 ALKPHOS  --   --  71  --   --  60  --   --  51 48 80  BILITOT  --   --  0.6  --   --  1.2  --   --  1.0 0.7 0.4  ALBUMIN  --   --  2.9*  --   --   2.4*  --   --  2.4* 1.9* 1.8*  MG  --    < > 1.6*  --   --  2.1  --   --  1.7 2.0 1.8  CRP  --   --   --   --   --   --  18.7*  --  19.5*  --   --   DDIMER  --   --   --   --   --   --  6.43*  --  7.14*  --   --   PROCALCITON  --   --  1.44  --   --  0.86  --   --  0.57 1.38 0.92  LATICACIDVEN  --   --   --   --   --   --  1.3 1.2  --   --   --   TSH 0.636  --   --   --   --   --   --   --   --   --   --  HGBA1C >15.5*   < >  --   --    < > >15.5*  --   --   --   --   --   BNP  --   --   --  315.9*  --   --   --   --   --   --   --    < > = values in this interval not displayed.    ------------------------------------------------------------------------------------------------------------------ No results for input(s): CHOL, HDL, LDLCALC, TRIG, CHOLHDL, LDLDIRECT in the last 72 hours.  Lab Results  Component Value Date   HGBA1C >15.5 (H) 04/19/2021    No results for input(s): TSH, T4TOTAL, T3FREE, THYROIDAB in the last 72 hours.  Invalid input(s): FREET3  ------------------------------------------------------------------------------------------------------------------ ID Labs Recent Labs  Lab 04/18/21 0052 04/19/21 0117 04/19/21 1710 04/19/21 1943 04/20/21 0126 04/21/21 0443 04/22/21 0225  WBC 9.2 8.5  --   --  4.1 6.4 7.3  PLT 132* 101*  --   --  96* 91* 111*  CRP  --   --  18.7*  --  19.5*  --   --   DDIMER  --   --  6.43*  --  7.14*  --   --   PROCALCITON 1.44 0.86  --   --  0.57 1.38 0.92  LATICACIDVEN  --   --  1.3 1.2  --   --   --   CREATININE 1.49* 1.33*  --   --  1.42* 1.19 1.22   Cardiac Enzymes No results for input(s): CKMB, TROPONINI, MYOGLOBIN in the last 168 hours.  Invalid input(s): CK  Radiology Reports CT ABDOMEN PELVIS WO CONTRAST  Result Date: 04/19/2021 CLINICAL DATA:  66 year old male with acute abdominal and pelvic pain. EXAM: CT ABDOMEN AND PELVIS WITHOUT CONTRAST TECHNIQUE: Multidetector CT imaging of the abdomen and pelvis was performed  following the standard protocol without IV contrast. COMPARISON:  05/01/2008 CT FINDINGS: Unenhanced CT was performed per clinician order. Lack of IV contrast limits sensitivity and specificity, especially for evaluation of vascular structures and abdominal/pelvic solid viscera. Lower chest: Mild bilateral LOWER lobe cylindrical bronchiectasis noted with minimal tree in bud opacities in the LEFT LOWER lobe. Coronary artery atherosclerotic calcifications are present. Hepatobiliary: No hepatic or gallbladder abnormalities are identified. No biliary dilatation noted. Pancreas: Unremarkable Spleen: Unremarkable Adrenals/Urinary Tract: The kidneys, adrenal glands and bladder are unremarkable. Stomach/Bowel: Mildly distended loops of fluid-filled small bowel are noted without transition point. There is mild circumferential wall thickening of the distal ileum to the cecum with adjacent inflammation and small amount of free fluid. No other areas of bowel wall thickening are noted. No evidence of pneumoperitoneum, pneumatosis intestinalis or portal venous gas. The appendix appears normal. Vascular/Lymphatic: Heavy arterial vascular calcifications are noted including aortic atherosclerosis. Heavy SMA and IMA calcifications are identified. No enlarged lymph nodes are identified. Reproductive: Prostate is unremarkable. Other: Small amount of free fluid is noted within the pelvis, RIGHT LOWER quadrant and adjacent to the liver and spleen. Musculoskeletal: No acute or suspicious bony abnormalities are noted. IMPRESSION: 1. Mild circumferential wall thickening of the distal ileum with adjacent inflammation and small amount of free fluid. This is nonspecific but may represent an infectious or inflammatory enteritis. Given very heavy mesenteric arterial calcifications, ischemia is not entirely excluded but no evidence of pneumatosis intestinalis or pneumoperitoneum. Mildly distended loops of fluid-filled small bowel proximally  without transition point, which may represent a mild ileus or low-grade partial obstruction at the distal ileum.  2. Mild bilateral LOWER lobe cylindrical bronchiectasis with minimal tree in bud opacities in the LEFT LOWER lobe, likely representing a mild infectious or inflammatory process. 3. Coronary artery disease and aortic Atherosclerosis (ICD10-I70.0). Electronically Signed   By: Margarette Canada M.D.   On: 04/19/2021 11:00   CT Angio Abd/Pel w/ and/or w/o  Result Date: 04/19/2021 CLINICAL DATA:  Mesenteric ischemia EXAM: CTA ABDOMEN AND PELVIS WITHOUT AND WITH CONTRAST TECHNIQUE: Multidetector CT imaging of the abdomen and pelvis was performed using the standard protocol during bolus administration of intravenous contrast. Multiplanar reconstructed images and MIPs were obtained and reviewed to evaluate the vascular anatomy. RADIATION DOSE REDUCTION: This exam was performed according to the departmental dose-optimization program which includes automated exposure control, adjustment of the mA and/or kV according to patient size and/or use of iterative reconstruction technique. CONTRAST:  152m OMNIPAQUE IOHEXOL 350 MG/ML SOLN COMPARISON:  04/19/2021 FINDINGS: VASCULAR Normal contour and caliber of the abdominal aorta. No evidence of aneurysm, dissection, or other acute aortic pathology. Duplicated right renal arteries, with a small accessory inferior pole artery and a stent of the dominant main renal artery. Solitary left renal artery. Otherwise standard branching pattern of the abdominal aorta. Moderate mixed calcific atherosclerosis. Mixed atherosclerosis of the branch vessel origins as well as the first and second order branch vessels of the superior mesenteric artery without high-grade stenosis. Review of the MIP images confirms the above findings. NON-VASCULAR Lower chest: Coronary artery calcifications. Scattered centrilobular nodular and ground-glass opacity of the left lung base (series 8, image 18).  Hepatobiliary: No solid liver abnormality is seen. Hepatic steatosis. No gallstones, gallbladder wall thickening, or biliary dilatation. Pancreas: Unremarkable. No pancreatic ductal dilatation or surrounding inflammatory changes. Spleen: Normal in size without significant abnormality. Adrenals/Urinary Tract: Adrenal glands are unremarkable. Kidneys are normal, without renal calculi, solid lesion, or hydronephrosis. Bladder is unremarkable. Stomach/Bowel: Stomach is within normal limits. Appendix appears normal. Wall thickening and adjacent inflammatory fat stranding of the cecal base and terminal ileum, involving approximately 20 cm of the terminal ileum (series 11, image 71). Lymphatic: No enlarged abdominal or pelvic lymph nodes. Reproductive: No mass or other significant abnormality. Other: No abdominal wall hernia or abnormality. Small volume perisplenic and pelvic ascites. Musculoskeletal: No acute osseous findings. IMPRESSION: 1. Normal contour and caliber of the abdominal aorta. No evidence of aneurysm, dissection, or other acute aortic pathology. 2. Moderate mixed calcific atherosclerosis without high-grade stenosis of the branch vessel origins as well as the first and second order branch vessels of the superior mesenteric artery without occlusion or high-grade stenosis. 3. Stent at the origin of the dominant right renal artery. 4. Wall thickening and adjacent inflammatory fat stranding of the cecal base and terminal ileum, involving approximately 20 cm of the terminal ileum, consistent with nonspecific infectious or inflammatory terminal ileitis, which may be of infectious, inflammatory, or ischemic etiology. Crohn's disease is a substantial differential consideration given this appearance and distribution. 5. Scattered centrilobular nodular and ground-glass opacity of the left lung base, consistent with nonspecific infectious or aspiration. 6. Hepatic steatosis. 7. Small volume ascites. Electronically  Signed   By: ADelanna AhmadiM.D.   On: 04/19/2021 18:46     Patient ID: [name] male   DOB: 1Jan 15, 1957 66y.o.   MRN: [id number]

## 2021-04-22 NOTE — Progress Notes (Addendum)
3 Days Post-Op  Subjective: CC: Patient reports soreness around his incision that feels improved from yesterday. Denies abdominal pain otherwise. Does not feel bloated/distended. Tolerating cld and finishing most/all of his tray without n/v. Passing flatus. He had a loose bm today that he said was brown but had some small streaks of red. Mobilizing in the halls. Voiding since foley removal.   Objective: Vital signs in last 24 hours: Temp:  [97.5 F (36.4 C)-98.1 F (36.7 C)] 98 F (36.7 C) (01/25 0846) Pulse Rate:  [60-79] 70 (01/25 0909) Resp:  [14-20] 20 (01/25 0846) BP: (110-151)/(60-76) 151/71 (01/25 0846) SpO2:  [93 %-95 %] 93 % (01/25 0846) Last BM Date: 04/19/21  Intake/Output from previous day: 01/24 0701 - 01/25 0700 In: 2622.9 [P.O.:360; I.V.:2112.9; IV Piggyback:150] Out: 1700 [Urine:1700] Intake/Output this shift: No intake/output data recorded.  PE: Gen:  Alert, NAD, pleasant Pulm: rate and effort normal Abd: Protuberant, suspect at least mildly distended (patient reports abdomen is at baseline), appropriately over midline incision which appears appropriate. Otherwise NT. No peritonitis. +BS Psych: A&Ox3  Skin: no rashes noted, warm and dry  Lab Results:  Recent Labs    04/21/21 0443 04/22/21 0225  WBC 6.4 7.3  HGB 10.6* 10.0*  HCT 30.3* 28.9*  PLT 91* 111*   BMET Recent Labs    04/21/21 0443 04/22/21 0225  NA 138 138  K 3.8 3.9  CL 109 106  CO2 22 26  GLUCOSE 112* 90  BUN 15 11  CREATININE 1.19 1.22  CALCIUM 6.8* 7.3*   PT/INR No results for input(s): LABPROT, INR in the last 72 hours. CMP     Component Value Date/Time   NA 138 04/22/2021 0225   NA 141 07/11/2019 0812   K 3.9 04/22/2021 0225   CL 106 04/22/2021 0225   CO2 26 04/22/2021 0225   GLUCOSE 90 04/22/2021 0225   BUN 11 04/22/2021 0225   BUN 15 07/11/2019 0812   CREATININE 1.22 04/22/2021 0225   CREATININE 1.25 10/09/2020 1556   CALCIUM 7.3 (L) 04/22/2021 0225   PROT  4.7 (L) 04/22/2021 0225   PROT 6.7 07/11/2019 0811   ALBUMIN 1.8 (L) 04/22/2021 0225   ALBUMIN 4.1 07/11/2019 0811   AST 31 04/22/2021 0225   ALT 23 04/22/2021 0225   ALKPHOS 80 04/22/2021 0225   BILITOT 0.4 04/22/2021 0225   BILITOT 0.3 07/11/2019 0811   GFRNONAA >60 04/22/2021 0225   GFRAA 87 07/11/2019 0812   Lipase     Component Value Date/Time   LIPASE 85 (H) 04/19/2021 0117    Studies/Results: No results found.  Anti-infectives: Anti-infectives (From admission, onward)    Start     Dose/Rate Route Frequency Ordered Stop   04/20/21 0200  piperacillin-tazobactam (ZOSYN) IVPB 3.375 g  Status:  Discontinued        3.375 g 12.5 mL/hr over 240 Minutes Intravenous Every 8 hours 04/19/21 1649 04/20/21 0105   04/20/21 0200  piperacillin-tazobactam (ZOSYN) IVPB 3.375 g        3.375 g 12.5 mL/hr over 240 Minutes Intravenous Every 8 hours 04/20/21 0105 04/24/21 0559   04/20/21 0000  piperacillin-tazobactam (ZOSYN) IVPB 3.375 g  Status:  Discontinued        3.375 g 100 mL/hr over 30 Minutes Intravenous Every 6 hours 04/19/21 2334 04/20/21 0105   04/19/21 1745  piperacillin-tazobactam (ZOSYN) IVPB 3.375 g        3.375 g 100 mL/hr over 30 Minutes Intravenous  Once 04/19/21 1649  04/19/21 1910        Assessment/Plan POD3 s/p dx lap converted to ex lap with ileocecectomy for 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 Thermon Leyland 1/22 - afebrile, VSS. WBC wnl - Tolerating cld and having bowel function. ADAT - Hep gtt started 1/23. Plt's improving. If tolerates po today, suspect can transition to po anticoagulation - Cont abx 5d post op - PT/OT. Pulm toilet.   FEN: FLD, ADAT ID: [email or web address] 1/23> VTE: hep gtt 1/23 >> platelets 111  Per primary: T2DM with DKA AKI Afib on eliquis baseline HTN  Moderate Medical Decision Making    LOS: 6 days    Jillyn Ledger , [facility] 04/22/2021, 10:35 AM Please see  Amion for pager number during day hours 7:00am-4:30pm

## 2021-04-22 NOTE — TOC Benefit Eligibility Note (Signed)
Patient Advocate Encounter ° °Insurance verification completed.   ° °The patient is currently admitted and upon discharge could be taking Eliquis 5 mg. ° °The current 30 day co-pay is, $0.00.  ° °The patient is insured through [employer] Medicare Part D  ° ° ° °Elya Diloreto, CPhT °Pharmacy Patient Advocate Specialist °[facility] Patient Advocate Team °Direct Number: [phone]  Fax: [phone] ° ° ° ° ° °  °

## 2021-04-23 LAB — CBC
HCT: 28.2 % — ABNORMAL LOW (ref 39.0–52.0)
Hemoglobin: 9.6 g/dL — ABNORMAL LOW (ref 13.0–17.0)
MCH: 32.9 pg (ref 26.0–34.0)
MCHC: 34 g/dL (ref 30.0–36.0)
MCV: 96.6 fL (ref 80.0–100.0)
Platelets: 109 10*3/uL — ABNORMAL LOW (ref 150–400)
RBC: 2.92 MIL/uL — ABNORMAL LOW (ref 4.22–5.81)
RDW: 13.2 % (ref 11.5–15.5)
WBC: 6 10*3/uL (ref 4.0–10.5)
nRBC: 0.3 % — ABNORMAL HIGH (ref 0.0–0.2)

## 2021-04-23 LAB — PROCALCITONIN: Procalcitonin: 0.66 ng/mL

## 2021-04-23 LAB — COMPREHENSIVE METABOLIC PANEL
ALT: 19 U/L (ref 0–44)
AST: 26 U/L (ref 15–41)
Albumin: 1.7 g/dL — ABNORMAL LOW (ref 3.5–5.0)
Alkaline Phosphatase: 67 U/L (ref 38–126)
Anion gap: 5 (ref 5–15)
BUN: 9 mg/dL (ref 8–23)
CO2: 25 mmol/L (ref 22–32)
Calcium: 7.2 mg/dL — ABNORMAL LOW (ref 8.9–10.3)
Chloride: 108 mmol/L (ref 98–111)
Creatinine, Ser: 1.17 mg/dL (ref 0.61–1.24)
GFR, Estimated: >60 mL/min (ref >60)
Glucose, Bld: 160 mg/dL — ABNORMAL HIGH (ref 70–99)
Potassium: 3.3 mmol/L — ABNORMAL LOW (ref 3.5–5.1)
Sodium: 138 mmol/L (ref 135–145)
Total Bilirubin: 0.4 mg/dL (ref 0.3–1.2)
Total Protein: 4.3 g/dL — ABNORMAL LOW (ref 6.5–8.1)

## 2021-04-23 LAB — GLUCOSE, CAPILLARY
Glucose-Capillary: 152 mg/dL — ABNORMAL HIGH (ref 70–99)
Glucose-Capillary: 166 mg/dL — ABNORMAL HIGH (ref 70–99)
Glucose-Capillary: 207 mg/dL — ABNORMAL HIGH (ref 70–99)
Glucose-Capillary: 208 mg/dL — ABNORMAL HIGH (ref 70–99)

## 2021-04-23 LAB — HEPARIN LEVEL (UNFRACTIONATED): Heparin Unfractionated: 0.46 IU/mL (ref 0.30–0.70)

## 2021-04-23 LAB — MAGNESIUM: Magnesium: 1.4 mg/dL — ABNORMAL LOW (ref 1.7–2.4)

## 2021-04-23 MED ORDER — ADULT MULTIVITAMIN W/MINERALS CH
1.0000 | ORAL_TABLET | Freq: Every day | ORAL | Status: DC
  Administered 2021-04-23 – 2021-04-24 (×2): 1 via ORAL
  Filled 2021-04-23 (×2): qty 1

## 2021-04-23 MED ORDER — MAGNESIUM SULFATE 2 GM/50ML IV SOLN
2.0000 g | Freq: Once | INTRAVENOUS | Status: AC
  Administered 2021-04-23: 2 g via INTRAVENOUS
  Filled 2021-04-23: qty 50

## 2021-04-23 MED ORDER — GLUCERNA SHAKE PO LIQD
237.0000 mL | Freq: Three times a day (TID) | ORAL | Status: DC
  Administered 2021-04-23 (×3): 237 mL via ORAL

## 2021-04-23 MED ORDER — POTASSIUM CHLORIDE CRYS ER 20 MEQ PO TBCR
40.0000 meq | EXTENDED_RELEASE_TABLET | Freq: Four times a day (QID) | ORAL | Status: AC
  Administered 2021-04-23 (×2): 40 meq via ORAL
  Filled 2021-04-23 (×2): qty 2

## 2021-04-23 NOTE — Progress Notes (Signed)
ANTICOAGULATION CONSULT NOTE -follow up  Pharmacy Consult for heparin Indication: AF  Allergies  Allergen Reactions   Vicodin [Hydrocodone-Acetaminophen] Nausea And Vomiting    Patient Measurements: Height: 6' (182.9 cm) Weight: 81.6 kg (180 lb) IBW/kg (Calculated) : 77.6 Heparin dosing weight:  =TBW 81.6 kg   Vital Signs: Temp: 97.5 F (36.4 C) (01/26 0812) Temp Source: Oral (01/26 0812) BP: 136/82 (01/26 0812) Pulse Rate: 67 (01/26 0812)  Labs: Recent Labs    04/20/21 1857 04/20/21 1857 04/21/21 0443 04/21/21 1214 04/22/21 0225 04/23/21 0125  HGB  --    < > 10.6*  --  10.0* 9.6*  HCT  --   --  30.3*  --  28.9* 28.2*  PLT  --   --  91*  --  111* 109*  APTT 39*  --  76* 77*  --   --   HEPARINUNFRC 0.69  --  0.59 0.53 0.44 0.46  CREATININE  --   --  1.19  --  1.22 1.17   < > = values in this interval not displayed.     Estimated Creatinine Clearance: 68.2 mL/min (by C-G formula based on SCr of 1.17 mg/dL).   Medical History: Past Medical History:  Diagnosis Date   AKI (acute kidney injury) ([place]) 12/2015   Anxiety    Arthritis    Carotid artery stenosis    Coronary artery disease    Dementia ([place])    problems with short term memory   Depression    DKA (diabetic ketoacidosis) ([place]) 04/16/2021   Dysrhythmia    episode of afib 12/2005 in setting of cocaine use   Gout    Hyperlipidemia    Hypertension    Myocardial infarction [facility])    2017   PAD (peripheral artery disease) ([facility])    Stroke ([facility])    TIA   TIA (transient ischemic attack)    over 20 years ago    Assessment: 66yo male had Eliquis for h/o Afib reversed for urgent ileocecectomy/ex-lap. Plan to bridge with IV heparin for 2-3 days before resuming apixaban.   Heparin level =0.46 remains therapeutic on heparin 1350 units/hr. Likely to change back to apixaban tomorrow, if he tolerates po's today per surgery. He is also on D5 zosyn. Plan for 5d.   Hgb ~10s,>9.6 plts down 111k>109K No bleeding  reported.  Goal of Therapy:  Heparin level: 0.3-0.5 Monitor platelets by anticoagulation protocol: Yes   Plan:  Continue Heparin at 1350 units/hr Daily HL and CBC.  Watch pltc. F/u resume apixaban- Surgery said if tolerates po today, can transition to po anticoagulation tomorrow 1/27. Rx will sign off off zosyn  Nicole Cella, [place] Clinical Pharmacist [phone] 04/23/2021 11:02 AM Please check AMION for all [place] phone numbers After 10:00 PM, call [place] [phone]

## 2021-04-23 NOTE — Progress Notes (Signed)
Nutrition Follow-up  DOCUMENTATION CODES:   Not applicable  INTERVENTION:   Diet education Continue Glucerna Shake po TID, each supplement provides 220 kcal and 10 grams of protein Multivitamin w/ minerals daily  NUTRITION DIAGNOSIS:   Inadequate oral intake related to poor appetite as evidenced by per patient/family report.  GOAL:   Patient will meet greater than or equal to 90% of their needs  MONITOR:   PO intake, Supplement acceptance, Labs, Skin  REASON FOR ASSESSMENT:   LOS    ASSESSMENT:   66 y.o. male presented to the ED from PCP due to elevated CBG and Hgb A1c level. Pt had been experiencing polyuria, tremors, weakness, and polydipsia for a couple weeks. PMH includes HTN, TIA, and CAD. Pt admitted with DKA and new diagnosis of diabetes.   01/22 - Ex-lap for ischemic bowel s/p ileocecectomy  RD received consult over weekend for diet education. RD was unable to reach pt, placed nutrition handout in AVC and placed referral for outpatient DM education.   Pt reports that his appetite was bad for about 1 week prior to admission. No intakes recorded within EMR. Pt denies any nausea or vomiting.   Pt reports that he believes that he lost 20# the week prior to coming to the hospital. Pt reports that he thinks that he has gained some of it back. Suspect current weight is stated versus actual.   Provided pt with "Plate Method for Diabetes" handout and reviewed. Discussed what foods are carbohydrates, incorporate carbohydrates into each meal, and adding protein with our carbohydrates to help with digestion. Discussed beverage selections and picking zero calorie/diet beverages and using artifical sweeteners for tea. Pt expressed that he already plans to cut/limit those beverages from his diet.   Reached out to Pharmacist about low magnesium; pharmacy already had reached out to MD to replete.   Medications reviewed and include: Colace, SSI 0-9 units q6h, Semglee, Protonix,  Potassium Chloride, IV magnesium sulfate, IV antibiotics Labs reviewed:  - Potassium 3.3 - Magnesium 1.4 - Hgb A1c >15.5% - 24 hr CBG 118-207   NUTRITION - FOCUSED PHYSICAL EXAM:  Flowsheet Row Most Recent Value  Orbital Region No depletion  Upper Arm Region No depletion  Thoracic and Lumbar Region No depletion  Buccal Region No depletion  Temple Region Mild depletion  Clavicle Bone Region No depletion  Clavicle and Acromion Bone Region No depletion  Scapular Bone Region No depletion  Dorsal Hand Mild depletion  Patellar Region No depletion  Anterior Thigh Region No depletion  Posterior Calf Region No depletion  Edema (RD Assessment) None  Hair Reviewed  Eyes Reviewed  Mouth Reviewed  Skin Reviewed  Nails Reviewed       Diet Order:   Diet Order             DIET SOFT Room service appropriate? Yes; Fluid consistency: Thin  Diet effective now                   EDUCATION NEEDS:   Education needs have been addressed  Skin:  Skin Assessment: Reviewed RN Assessment (Abdonminal Incision)  Last BM:  1/25  Height:   Ht Readings from Last 1 Encounters:  04/16/21 6' (1.829 m)    Weight:   Wt Readings from Last 1 Encounters:  04/16/21 81.6 kg    Ideal Body Weight:  80.9 kg  BMI:  Body mass index is 24.41 kg/m.  Estimated Nutritional Needs:   Kcal:  2100-2300  Protein:  105-120 grams  Fluid:  >/= 2.1 L    Michel Eskelson Louie Casa, RD, LDN Clinical Dietitian See [facility] for contact information.

## 2021-04-23 NOTE — Progress Notes (Signed)
Physical Therapy Treatment Patient Details Name: [name] MRN: [id number] DOB: [birth year] Today's Date: 04/23/2021   History of Present Illness 66 y/o male admitted 1/19 with weakness, increased urination and tremors.  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 PMH includes: anxiety, AKI, CAD, dementia, DKA, CVA, TIA.    PT Comments    Pt making excellent progress. Ready for dc from PT standpoint when medically ready. Does not need follow up PT.    Recommendations for follow up therapy are one component of a multi-disciplinary discharge planning process, led by the attending physician.  Recommendations may be updated based on patient status, additional functional criteria and insurance authorization.  Follow Up Recommendations  No PT follow up     Assistance Recommended at Discharge PRN  Patient can return home with the following     Equipment Recommendations  None recommended by PT    Recommendations for Other Services       Precautions / Restrictions Precautions Precautions: Other (comment) Precaution Comments: abdominal incision     Mobility  Bed Mobility               General bed mobility comments: Pt up in chair    Transfers Overall transfer level: Needs assistance Equipment used: None Transfers: Sit to/from Stand Sit to Stand: Supervision           General transfer comment: supervision for safety/lines    Ambulation/Gait Ambulation/Gait assistance: Supervision Gait Distance (Feet): 350 Feet Assistive device: None Gait Pattern/deviations: Step-through pattern, Decreased stride length Gait velocity: decr Gait velocity interpretation: >2.62 ft/sec, indicative of community ambulatory   General Gait Details: supervision with safety   Stairs             Wheelchair Mobility    Modified Rankin (Stroke Patients Only)       Balance Overall balance assessment: No apparent  balance deficits (not formally assessed)                                          Cognition Arousal/Alertness: Awake/alert Behavior During Therapy: WFL for tasks assessed/performed Overall Cognitive Status: Within Functional Limits for tasks assessed                                          Exercises      General Comments        Pertinent Vitals/Pain Pain Assessment Pain Assessment: Faces Faces Pain Scale: Hurts little more Pain Location: abdomen Pain Descriptors / Indicators: Discomfort, Grimacing, Guarding Pain Intervention(s): Monitored during session    Home Living                          Prior Function            PT Goals (current goals can now be found in the care plan section) Acute Rehab PT Goals Patient Stated Goal: go home Progress towards PT goals: Goals met and updated - see care plan    Frequency    Min 3X/week      PT Plan Discharge plan needs to be updated    Co-evaluation              AM-PAC PT "6 Clicks"  Mobility   Outcome Measure  Help needed turning from your back to your side while in a flat bed without using bedrails?: None Help needed moving from lying on your back to sitting on the side of a flat bed without using bedrails?: None Help needed moving to and from a bed to a chair (including a wheelchair)?: A Little Help needed standing up from a chair using your arms (e.g., wheelchair or bedside chair)?: A Little Help needed to walk in hospital room?: A Little Help needed climbing 3-5 steps with a railing? : A Little 6 Click Score: 20    End of Session   Activity Tolerance: Patient tolerated treatment well Patient left: in chair;with call bell/phone within reach;with chair alarm set Nurse Communication: Mobility status PT Visit Diagnosis: Other abnormalities of gait and mobility (R26.89)     Time: 3244-0102 PT Time Calculation (min) (ACUTE ONLY): 18 min  Charges:  $Gait  Training: 8-22 mins                     [place] Pager [phone] Office [place] 04/23/2021, 3:15 PM

## 2021-04-23 NOTE — Progress Notes (Addendum)
4 Days Post-Op  Subjective: CC: Patient reports there was 20-30 minutes yesterday where he sharp pain right at his incision that after receiving pain medication relieved. Since that time he has had no further episodes of increased pain and continues to only complain of soreness right at his incision and otherwise denies any other areas of abdominal pain. The increased pain he felt yesterday was no associated with movement and he had not eaten within the last 2 hours. It has not recurred. He denies any associated n/v. He is passing flatus. Last BM was prior to me seeing him yesterday morning where he had a brown semi-formed stool but had some small streaks of red. He is currently on a FLD but reports he has only been ordering off the CLD as he was not aware. He reports he mobilized in the hallways yesterday. Continues to void without difficulty. Afebrile. WBC remains wnl.   Objective: Vital signs in last 24 hours: Temp:  [97.4 F (36.3 C)-98.4 F (36.9 C)] 97.5 F (36.4 C) (01/26 0812) Pulse Rate:  [62-73] 67 (01/26 0812) Resp:  [17-21] 21 (01/26 0812) BP: (96-149)/(60-85) 136/82 (01/26 0812) SpO2:  [93 %-99 %] 94 % (01/26 0812) Last BM Date: 04/22/21  Intake/Output from previous day: 01/25 0701 - 01/26 0700 In: 2202.4 [P.O.:560; I.V.:1519.5; IV Piggyback:122.9] Out: 1550 [Urine:1550] Intake/Output this shift: Total I/O In: -  Out: 300 [Urine:300]  PE: Gen:  Alert, NAD, pleasant Pulm: rate and effort normal Abd: Protuberant, stable mild distension, appropriately over midline incision. Otherwise NT. No peritonitis. +BS Psych: A&Ox3  Skin: no rashes noted, warm and dry  Lab Results:  Recent Labs    04/22/21 0225 04/23/21 0125  WBC 7.3 6.0  HGB 10.0* 9.6*  HCT 28.9* 28.2*  PLT 111* 109*   BMET Recent Labs    04/22/21 0225 04/23/21 0125  NA 138 138  K 3.9 3.3*  CL 106 108  CO2 26 25  GLUCOSE 90 160*  BUN 11 9  CREATININE 1.22 1.17  CALCIUM 7.3* 7.2*    PT/INR No results for input(s): LABPROT, INR in the last 72 hours. CMP     Component Value Date/Time   NA 138 04/23/2021 0125   NA 141 07/11/2019 0812   K 3.3 (L) 04/23/2021 0125   CL 108 04/23/2021 0125   CO2 25 04/23/2021 0125   GLUCOSE 160 (H) 04/23/2021 0125   BUN 9 04/23/2021 0125   BUN 15 07/11/2019 0812   CREATININE 1.17 04/23/2021 0125   CREATININE 1.25 10/09/2020 1556   CALCIUM 7.2 (L) 04/23/2021 0125   PROT 4.3 (L) 04/23/2021 0125   PROT 6.7 07/11/2019 0811   ALBUMIN 1.7 (L) 04/23/2021 0125   ALBUMIN 4.1 07/11/2019 0811   AST 26 04/23/2021 0125   ALT 19 04/23/2021 0125   ALKPHOS 67 04/23/2021 0125   BILITOT 0.4 04/23/2021 0125   BILITOT 0.3 07/11/2019 0811   GFRNONAA >60 04/23/2021 0125   GFRAA 87 07/11/2019 0812   Lipase     Component Value Date/Time   LIPASE 85 (H) 04/19/2021 0117    Studies/Results: No results found.  Anti-infectives: Anti-infectives (From admission, onward)    Start     Dose/Rate Route Frequency Ordered Stop   04/20/21 0200  piperacillin-tazobactam (ZOSYN) IVPB 3.375 g  Status:  Discontinued        3.375 g 12.5 mL/hr over 240 Minutes Intravenous Every 8 hours 04/19/21 1649 04/20/21 0105   04/20/21 0200  piperacillin-tazobactam (ZOSYN) IVPB 3.375 g  3.375 g 12.5 mL/hr over 240 Minutes Intravenous Every 8 hours 04/20/21 0105 04/24/21 0559   04/20/21 0000  piperacillin-tazobactam (ZOSYN) IVPB 3.375 g  Status:  Discontinued        3.375 g 100 mL/hr over 30 Minutes Intravenous Every 6 hours 04/19/21 2334 04/20/21 0105   04/19/21 1745  piperacillin-tazobactam (ZOSYN) IVPB 3.375 g        3.375 g 100 mL/hr over 30 Minutes Intravenous  Once 04/19/21 1649 04/19/21 1910        Assessment/Plan POD4 s/p dx lap converted to ex lap with ileocecectomy for 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 Thermon Leyland 1/22 - afebrile, VSS. WBC wnl - Tolerating cld and having bowel function.  Currently on FLD but only has tried CLD. Will see how he does with FLD this am and he can be advanced as tolerated to soft. I have secure chatted the patient's RN to let her know this.  - Hep gtt started 1/23. Plt's stable at 109k. If tolerates po today, can transition to po anticoagulation - Complete post operative abx today - PT/OT. Pulm toilet.   FEN: FLD, ADAT. Add Glucerna  ID: [email or web address] 1/22 - 1/26 VTE: hep gtt 1/23 >> platelets 109. If tolerates po today, can transition to po anticoagulation   Per primary: T2DM with DKA AKI Afib on eliquis baseline HTN  Moderate Medical Decision Making   LOS: 7 days    Jillyn Ledger , [facility] 04/23/2021, 9:37 AM Please see Amion for pager number during day hours 7:00am-4:30pm

## 2021-04-23 NOTE — Progress Notes (Addendum)
PROGRESS NOTE                                                                                                                                                                                                             Patient Demographics:    [name], is a 66 y.o. male, DOB - 02/13/1956, MLY:[id number]  Outpatient Primary MD for the patient is [name], MD    LOS - 7  Admit date - 04/16/2021    Chief Complaint  Patient presents with   Hyperglycemia       Brief Narrative (HPI from H&P)     66 y.o. male with medical history significant of CAD, HTN.  He presented to the ER with about 5 to 7-day history of feeling weak with increase in urination and thirst along with some tremors.  In the ER he was diagnosed with new onset DM type II with DKA and admitted to the hospital.  He was treated for DKA and stabilized he was about to be discharged on 04/19/2021 but right before being discharged he developed acute onset of abdominal pain and was found to have new ischemic bowel he underwent laparotomy with ileocecal resection and now recovering from the surgery.   Subjective:   He denies any chest pain, shortness of breath, nausea or vomiting, no bowel movement every night, but he had bowel movement yesterday, passing flatus.      Assessment  & Plan :    No notes have been filed under this hospital service. Service: Hospitalist   New diagnosis of DM type II with DKA on presentation - with severe dehydration - AKI, pseudohyponatremia and metabolic acidosis due to DKA.   -He was treated with DKA protocol along with aggressive IV fluids and electrolyte replacement, gap is closed he has been transitioned to Lantus and sliding scale, dose adjusted on 04/20/2021 as he is n.p.o. postop.  Continue to monitor and adjust closely. -Currently controlled on 8 units of Lantus and insulin sliding scale.  CBG (last 3)  Recent Labs     04/22/21 2002 04/23/21 0217 04/23/21 0916  GLUCAP 118* 152* 166*   Lab Results  Component Value Date   HGBA1C >15.5 (H) 04/19/2021     Ischemic bowel /embolic mesenteric ischemia - patient sudden onset of periumbilical abdominal pain on 04/19/2021.  Work-up suggestive of  ischemic bowel, seen by vascular surgery and general surgery . -  s/p dx lap converted to ex lap with ileocecectomy for 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 Thermon Leyland 1/22 - Continue hydration, continue heparin drip.  He has plenty of risk factors which are PAD,CAD, Smoking, Afib - Heparin drip resumed on 04/20/2021 after discussions with general surgery.  On IV Zosyn, defer stop date to general surgery continue to monitor with supportive care.  Strictly counseled to quit smoking.  He is placing flatus on 04/21/2021 defer oral intake and IV antibiotics to general surgery for now continue IV fluids. -Management per general surgery, will be advanced to soft diet today. -Remains on heparin GTT, likely can be transition to Eliquis tomorrow if tolerating p.o. today per general surgery.  Paroxysmal atrial fibrillation.  Continue beta-blocker and heparin instead of Eliquis in the perioperative period.  Mali vas 2 score of greater than 3. -If tolerates oral today, can transition to p.o. anticoagulation  Hypertension.  Blood pressure is better on beta-blocker monitor add as needed hydralazine..  Dyslipidemia on statin and Vascepa.  AKI on CKD 3A with baseline creatinine of 1.5, AKI due to severe dehydration - holding ARB and HCTZ, has been aggressively hydrated with IV fluids and AKI seems to have resolved.  Hypokalemia -potassium is low at 3.3 today, repleted.  Hypomagnesemia.  Aggressively replaced.  H/O gout.  On allopurinol.  Encouraged the patient to sit up in chair in the daytime use I-S and flutter valve for pulmonary toiletry.  Will advance activity and titrate down  oxygen as possible.         Condition - Fair  Family Communication  : None at bedside  Code Status :  Full  Consults  :  None  PUD Prophylaxis : PPI   Procedures  :     Exploratory laparotomy with Ileocecectomy - by [facility] 04/20/21   CT - 1. Mild circumferential wall thickening of the distal ileum with adjacent inflammation and small amount of free fluid. This is nonspecific but may represent an infectious or inflammatory enteritis. Given very heavy mesenteric arterial calcifications, ischemia is not entirely excluded but no evidence of pneumatosis intestinalis or pneumoperitoneum. Mildly distended loops of fluid-filled small bowel proximally without transition point, which may represent a mild ileus or low-grade partial obstruction at the distal ileum. 2. Mild bilateral LOWER lobe cylindrical bronchiectasis with minimal tree in bud opacities in the LEFT LOWER lobe, likely representing a mild infectious or inflammatory process. 3. Coronary artery disease and aortic Atherosclerosis (ICD10-I70.0).       Disposition Plan  :    Status is: Inpatient  Remains inpatient appropriate because: DKA  DVT Prophylaxis  :    Place TED hose Start: 04/18/21 1101     Lab Results  Component Value Date   PLT 109 (L) 04/23/2021    Diet :  Diet Order             DIET SOFT Room service appropriate? Yes; Fluid consistency: Thin  Diet effective now                    Inpatient Medications  Scheduled Meds:  acetaminophen  650 mg Oral Q6H   allopurinol  100 mg Oral Daily   atorvastatin  80 mg Oral QHS   Chlorhexidine Gluconate Cloth  6 each Topical Daily   docusate sodium  100 mg Oral BID   escitalopram  20 mg Oral Daily  feeding supplement (GLUCERNA SHAKE)  237 mL Oral TID BM   icosapent Ethyl  2 g Oral BID   insulin aspart  0-9 Units Subcutaneous Q6H   insulin glargine-yfgn  8 Units Subcutaneous Daily   insulin starter kit- pen needles  1 kit Other Once   living well with  diabetes book   Does not apply Once   metoprolol tartrate  25 mg Oral BID   pantoprazole  40 mg Oral Daily   potassium chloride  40 mEq Oral Q6H   traZODone  100 mg Oral QHS   Continuous Infusions:  heparin 1,350 Units/hr (04/22/21 2113)   lactated ringers with kcl 75 mL/hr at 04/23/21 1000   magnesium sulfate bolus IVPB 2 g (04/23/21 1227)   methocarbamol (ROBAXIN) IV     piperacillin-tazobactam (ZOSYN)  IV 3.375 g (04/23/21 0601)    PRN Meds:.busPIRone, dextrose, hydrALAZINE, HYDROcodone-acetaminophen, HYDROmorphone (DILAUDID) injection, hydrOXYzine, methocarbamol (ROBAXIN) IV, metoprolol tartrate, morphine injection, ondansetron (ZOFRAN) IV, oxyCODONE, oxyCODONE, prochlorperazine, simethicone  Antibiotics  :    Anti-infectives (From admission, onward)    Start     Dose/Rate Route Frequency Ordered Stop   04/20/21 0200  piperacillin-tazobactam (ZOSYN) IVPB 3.375 g  Status:  Discontinued        3.375 g 12.5 mL/hr over 240 Minutes Intravenous Every 8 hours 04/19/21 1649 04/20/21 0105   04/20/21 0200  piperacillin-tazobactam (ZOSYN) IVPB 3.375 g        3.375 g 12.5 mL/hr over 240 Minutes Intravenous Every 8 hours 04/20/21 0105 04/24/21 0559   04/20/21 0000  piperacillin-tazobactam (ZOSYN) IVPB 3.375 g  Status:  Discontinued        3.375 g 100 mL/hr over 30 Minutes Intravenous Every 6 hours 04/19/21 2334 04/20/21 0105   04/19/21 1745  piperacillin-tazobactam (ZOSYN) IVPB 3.375 g        3.375 g 100 mL/hr over 30 Minutes Intravenous  Once 04/19/21 1649 04/19/21 1910      \   Emeline Gins Dareth Andrew M.D on 04/23/2021 at 1:24 PM  To page go to [email or web address]   [facility] -  Office  [phone]  See all Orders from today for further details    Objective:   Vitals:   04/23/21 0011 04/23/21 0355 04/23/21 0812 04/23/21 1149  BP: 96/60 108/66 136/82 134/84  Pulse: 70 67 67 74  Resp: 18 19 (!) 21 19  Temp: 98.1 F (36.7 C) 98.4 F (36.9 C) (!) 97.5 F (36.4 C) 98.1 F  (36.7 C)  TempSrc: Oral Oral Oral Oral  SpO2:  94% 94% 96%  Weight:      Height:        Wt Readings from Last 3 Encounters:  04/16/21 81.6 kg  03/12/21 85.3 kg  09/09/20 86.8 kg     Intake/Output Summary (Last 24 hours) at 04/23/2021 1324 Last data filed at 04/23/2021 1035 Gross per 24 hour  Intake 2202.4 ml  Output 2050 ml  Net 152.4 ml     Physical Exam  Awake Alert, Oriented X 3, No new F.N deficits, Normal affect Symmetrical Chest wall movement, Good air movement bilaterally, CTAB RRR,No Gallops,Rubs or new Murmurs, No Parasternal Heave +ve B.Sounds, Abd Soft, midline surgical wound covered with honeycomb mesh.  No rebound - guarding or rigidity. No Cyanosis, Clubbing or edema, No new Rash or bruise       Data Review:    CBC Recent Labs  Lab 04/16/21 2043 04/16/21 2051 04/18/21 0052 04/19/21 0117 04/20/21 0126 04/21/21 0177 04/22/21 0225 04/23/21 0125  WBC 13.1*  --  9.2 8.5 4.1 6.4 7.3 6.0  HGB 14.5   < > 15.0 12.7* 13.0 10.6* 10.0* 9.6*  HCT 45.4   < > 40.4 34.9* 37.2* 30.3* 28.9* 28.2*  PLT 187  --  132* 101* 96* 91* 111* 109*  MCV 104.1*  --  88.4 90.6 93.9 95.0 95.7 96.6  MCH 33.3  --  32.8 33.0 32.8 33.2 33.1 32.9  MCHC 31.9  --  37.1* 36.4* 34.9 35.0 34.6 34.0  RDW 12.7  --  12.7 12.6 13.0 13.3 13.3 13.2  LYMPHSABS 1.1  --  1.0 1.8  --   --   --   --   MONOABS 0.9  --  0.9 0.8  --   --   --   --   EOSABS 0.0  --  0.0 0.0  --   --   --   --   BASOSABS 0.0  --  0.0 0.0  --   --   --   --    < > = values in this interval not displayed.    Electrolytes Recent Labs  Lab 04/17/21 0211 04/17/21 0656 04/18/21 0729 04/18/21 1431 04/19/21 0117 04/19/21 1710 04/19/21 1943 04/20/21 0126 04/21/21 0443 04/22/21 0225 04/23/21 0125  NA 127*   < >  --   --  136  --   --  136 138 138 138  K 4.2   < >  --   --  3.0*  --   --  3.5 3.8 3.9 3.3*  CL 93*   < >  --   --  103  --   --  105 109 106 108  CO2 18*   < >  --   --  23  --   --  16* _0 GLUCOSE 1,085*   < >  --   --  88  --   --  148* 112* 90 160*  BUN 60*   < >  --   --  26*  --   --  _1 CREATININE 2.76*   < >  --   --  1.33*  --   --  1.42* 1.19 1.22 1.17  CALCIUM 8.5*   < >  --   --  7.8*  --   --  7.0* 6.8* 7.3* 7.2*  AST  --    < >  --   --  24  --   --  _2 ALT  --    < >  --   --  18  --   --  _3 ALKPHOS  --    < >  --   --  60  --   --  51 48 80 67  BILITOT  --    < >  --   --  1.2  --   --  1.0 0.7 0.4 0.4  ALBUMIN  --    < >  --   --  2.4*  --   --  2.4* 1.9* 1.8* 1.7*  MG  --    < >  --   --  2.1  --   --  1.7 2.0 1.8 1.4*  CRP  --   --   --   --   --  18.7*  --  19.5*  --   --   --   DDIMER  --   --   --   --   --  6.43*  --  7.14*  --   --   --   PROCALCITON  --    < >  --   --  0.86  --   --  0.57 1.38 0.92 0.66  LATICACIDVEN  --   --   --   --   --  1.3 1.2  --   --   --   --   TSH 0.636  --   --   --   --   --   --   --   --   --   --   HGBA1C >15.5*   < >  --    < > >15.5*  --   --   --   --   --   --   BNP  --   --  315.9*  --   --   --   --   --   --   --   --    < > = values in this interval not displayed.    ------------------------------------------------------------------------------------------------------------------ No results for input(s): CHOL, HDL, LDLCALC, TRIG, CHOLHDL, LDLDIRECT in the last 72 hours.  Lab Results  Component Value Date   HGBA1C >15.5 (H) 04/19/2021    No results for input(s): TSH, T4TOTAL, T3FREE, THYROIDAB in the last 72 hours.  Invalid input(s): FREET3  ------------------------------------------------------------------------------------------------------------------ ID Labs Recent Labs  Lab 04/19/21 0117 04/19/21 1710 04/19/21 1943 04/20/21 0126 04/21/21 0443 04/22/21 0225 04/23/21 0125  WBC 8.5  --   --  4.1 6.4 7.3 6.0  PLT 101*  --   --  96* 91* 111* 109*  CRP  --  18.7*  --  19.5*  --   --   --   DDIMER  --  6.43*  --  7.14*  --   --   --   PROCALCITON 0.86  --   --   0.57 1.38 0.92 0.66  LATICACIDVEN  --  1.3 1.2  --   --   --   --   CREATININE 1.33*  --   --  1.42* 1.19 1.22 1.17   Cardiac Enzymes No results for input(s): CKMB, TROPONINI, MYOGLOBIN in the last 168 hours.  Invalid input(s): CK  Radiology Reports CT Angio Abd/Pel w/ and/or w/o  Result Date: 04/19/2021 CLINICAL DATA:  Mesenteric ischemia EXAM: CTA ABDOMEN AND PELVIS WITHOUT AND WITH CONTRAST TECHNIQUE: Multidetector CT imaging of the abdomen and pelvis was performed using the standard protocol during bolus administration of intravenous contrast. Multiplanar reconstructed images and MIPs were obtained and reviewed to evaluate the vascular anatomy. RADIATION DOSE REDUCTION: This exam was performed according to the departmental dose-optimization program which includes automated exposure control, adjustment of the mA and/or kV according to patient size and/or use of iterative reconstruction technique. CONTRAST:  168m OMNIPAQUE IOHEXOL 350 MG/ML SOLN COMPARISON:  04/19/2021 FINDINGS: VASCULAR Normal contour and caliber of the abdominal aorta. No evidence of aneurysm, dissection, or other acute aortic pathology. Duplicated right renal arteries, with a small accessory inferior pole artery and a stent of the dominant main renal artery. Solitary left renal artery. Otherwise standard branching pattern of the abdominal aorta. Moderate mixed calcific atherosclerosis. Mixed atherosclerosis of the branch vessel origins as well as the first and second order branch vessels of the superior mesenteric artery without high-grade stenosis. Review of the MIP images confirms the above findings. NON-VASCULAR Lower chest: Coronary artery calcifications. Scattered centrilobular nodular and ground-glass opacity of the left lung base (series  8, image 18). Hepatobiliary: No solid liver abnormality is seen. Hepatic steatosis. No gallstones, gallbladder wall thickening, or biliary dilatation. Pancreas: Unremarkable. No pancreatic  ductal dilatation or surrounding inflammatory changes. Spleen: Normal in size without significant abnormality. Adrenals/Urinary Tract: Adrenal glands are unremarkable. Kidneys are normal, without renal calculi, solid lesion, or hydronephrosis. Bladder is unremarkable. Stomach/Bowel: Stomach is within normal limits. Appendix appears normal. Wall thickening and adjacent inflammatory fat stranding of the cecal base and terminal ileum, involving approximately 20 cm of the terminal ileum (series 11, image 71). Lymphatic: No enlarged abdominal or pelvic lymph nodes. Reproductive: No mass or other significant abnormality. Other: No abdominal wall hernia or abnormality. Small volume perisplenic and pelvic ascites. Musculoskeletal: No acute osseous findings. IMPRESSION: 1. Normal contour and caliber of the abdominal aorta. No evidence of aneurysm, dissection, or other acute aortic pathology. 2. Moderate mixed calcific atherosclerosis without high-grade stenosis of the branch vessel origins as well as the first and second order branch vessels of the superior mesenteric artery without occlusion or high-grade stenosis. 3. Stent at the origin of the dominant right renal artery. 4. Wall thickening and adjacent inflammatory fat stranding of the cecal base and terminal ileum, involving approximately 20 cm of the terminal ileum, consistent with nonspecific infectious or inflammatory terminal ileitis, which may be of infectious, inflammatory, or ischemic etiology. Crohn's disease is a substantial differential consideration given this appearance and distribution. 5. Scattered centrilobular nodular and ground-glass opacity of the left lung base, consistent with nonspecific infectious or aspiration. 6. Hepatic steatosis. 7. Small volume ascites. Electronically Signed   By: Delanna Ahmadi M.D.   On: 04/19/2021 18:46     Patient ID: [name], male   DOB: [birth year], 66 y.o.   MRN: [id number] Patient ID: [name], male   DOB:  12-08-1955, 66 y.o.   MRN: [id number]

## 2021-04-24 ENCOUNTER — Inpatient Hospital Stay (HOSPITAL_COMMUNITY): Payer: Medicare Other

## 2021-04-24 ENCOUNTER — Inpatient Hospital Stay: Payer: Self-pay

## 2021-04-24 DIAGNOSIS — R0602 Shortness of breath: Secondary | ICD-10-CM

## 2021-04-24 LAB — CBC
HCT: 27.7 % — ABNORMAL LOW (ref 39.0–52.0)
HCT: 28.3 % — ABNORMAL LOW (ref 39.0–52.0)
Hemoglobin: 9.4 g/dL — ABNORMAL LOW (ref 13.0–17.0)
Hemoglobin: 9.8 g/dL — ABNORMAL LOW (ref 13.0–17.0)
MCH: 32.3 pg (ref 26.0–34.0)
MCH: 32.9 pg (ref 26.0–34.0)
MCHC: 33.9 g/dL (ref 30.0–36.0)
MCHC: 34.6 g/dL (ref 30.0–36.0)
MCV: 95.2 fL (ref 80.0–100.0)
MCV: 95 fL (ref 80.0–100.0)
Platelets: 132 10*3/uL — ABNORMAL LOW (ref 150–400)
Platelets: 131 10*3/uL — ABNORMAL LOW (ref 150–400)
RBC: 2.91 MIL/uL — ABNORMAL LOW (ref 4.22–5.81)
RBC: 2.98 MIL/uL — ABNORMAL LOW (ref 4.22–5.81)
RDW: 13.2 % (ref 11.5–15.5)
RDW: 13.3 % (ref 11.5–15.5)
WBC: 6.8 10*3/uL (ref 4.0–10.5)
WBC: 7.1 10*3/uL (ref 4.0–10.5)
nRBC: 0.7 % — ABNORMAL HIGH (ref 0.0–0.2)
nRBC: 0.8 % — ABNORMAL HIGH (ref 0.0–0.2)

## 2021-04-24 LAB — COMPREHENSIVE METABOLIC PANEL
ALT: 23 U/L (ref 0–44)
AST: 29 U/L (ref 15–41)
Albumin: 1.8 g/dL — ABNORMAL LOW (ref 3.5–5.0)
Alkaline Phosphatase: 83 U/L (ref 38–126)
Anion gap: 7 (ref 5–15)
BUN: 7 mg/dL — ABNORMAL LOW (ref 8–23)
CO2: 24 mmol/L (ref 22–32)
Calcium: 7.3 mg/dL — ABNORMAL LOW (ref 8.9–10.3)
Chloride: 106 mmol/L (ref 98–111)
Creatinine, Ser: 1.01 mg/dL (ref 0.61–1.24)
GFR, Estimated: >60 mL/min (ref >60)
Glucose, Bld: 165 mg/dL — ABNORMAL HIGH (ref 70–99)
Potassium: 3.6 mmol/L (ref 3.5–5.1)
Sodium: 137 mmol/L (ref 135–145)
Total Bilirubin: 0.4 mg/dL (ref 0.3–1.2)
Total Protein: 4.7 g/dL — ABNORMAL LOW (ref 6.5–8.1)

## 2021-04-24 LAB — GLUCOSE, CAPILLARY
Glucose-Capillary: 168 mg/dL — ABNORMAL HIGH (ref 70–99)
Glucose-Capillary: 154 mg/dL — ABNORMAL HIGH (ref 70–99)
Glucose-Capillary: 139 mg/dL — ABNORMAL HIGH (ref 70–99)
Glucose-Capillary: 209 mg/dL — ABNORMAL HIGH (ref 70–99)

## 2021-04-24 LAB — HEPARIN LEVEL (UNFRACTIONATED)
Heparin Unfractionated: 0.33 IU/mL (ref 0.30–0.70)
Heparin Unfractionated: 0.27 IU/mL — ABNORMAL LOW (ref 0.30–0.70)

## 2021-04-24 LAB — PROCALCITONIN: Procalcitonin: 0.43 ng/mL

## 2021-04-24 LAB — MAGNESIUM: Magnesium: 1.6 mg/dL — ABNORMAL LOW (ref 1.7–2.4)

## 2021-04-24 MED ORDER — POTASSIUM CHLORIDE 2 MEQ/ML IV SOLN
INTRAVENOUS | Status: AC
  Filled 2021-04-24: qty 1000

## 2021-04-24 MED ORDER — SODIUM CHLORIDE 0.9% FLUSH
10.0000 mL | Freq: Two times a day (BID) | INTRAVENOUS | Status: DC
  Administered 2021-04-24 – 2021-05-03 (×16): 10 mL

## 2021-04-24 MED ORDER — SODIUM CHLORIDE 0.9% FLUSH: 10.0000 mL | INTRAVENOUS | Status: DC | PRN

## 2021-04-24 MED ORDER — MAGNESIUM SULFATE 2 GM/50ML IV SOLN: 2.0000 g | Freq: Once | INTRAVENOUS | Status: DC

## 2021-04-24 MED ORDER — MAGNESIUM SULFATE 4 GM/100ML IV SOLN
4.0000 g | Freq: Once | INTRAVENOUS | Status: AC
  Administered 2021-04-24: 4 g via INTRAVENOUS
  Filled 2021-04-24: qty 100

## 2021-04-24 MED ORDER — TRAVASOL 10 % IV SOLN
INTRAVENOUS | Status: AC
  Filled 2021-04-24: qty 528

## 2021-04-24 MED ORDER — IOHEXOL 300 MG/ML  SOLN
100.0000 mL | Freq: Once | INTRAMUSCULAR | Status: AC | PRN
  Administered 2021-04-24: 100 mL via INTRAVENOUS

## 2021-04-24 MED ORDER — HYDROMORPHONE HCL 1 MG/ML IJ SOLN
1.0000 mg | Freq: Once | INTRAMUSCULAR | Status: AC
  Administered 2021-04-24: 1 mg via INTRAVENOUS
  Filled 2021-04-24: qty 1

## 2021-04-24 NOTE — Progress Notes (Addendum)
ANTICOAGULATION CONSULT NOTE -follow up  Pharmacy Consult for heparin Indication: AF  Allergies  Allergen Reactions   Vicodin [Hydrocodone-Acetaminophen] Nausea And Vomiting    Patient Measurements: Height: 6' (182.9 cm) Weight: 81.6 kg (180 lb) IBW/kg (Calculated) : 77.6 Heparin dosing weight:  =TBW 81.6 kg   Vital Signs: Temp: 97.6 F (36.4 C) (01/27 1208) Temp Source: Oral (01/27 1208) BP: 148/70 (01/27 1208) Pulse Rate: 60 (01/27 1208)  Labs: Recent Labs    04/22/21 0225 04/23/21 0125 04/24/21 0127 04/24/21 1224  HGB 10.0* 9.6* 9.4* 9.8*  HCT 28.9* 28.2* 27.7* 28.3*  PLT 111* 109* 132* 131*  HEPARINUNFRC 0.44 0.46 0.33  --   CREATININE 1.22 1.17 1.01  --      Estimated Creatinine Clearance: 79 mL/min (by C-G formula based on SCr of 1.01 mg/dL).   Medical History: Past Medical History:  Diagnosis Date   AKI (acute kidney injury) ([place]) 12/2015   Anxiety    Arthritis    Carotid artery stenosis    Coronary artery disease    Dementia ([place])    problems with short term memory   Depression    DKA (diabetic ketoacidosis) ([place]) 04/16/2021   Dysrhythmia    episode of afib 12/2005 in setting of cocaine use   Gout    Hyperlipidemia    Hypertension    Myocardial infarction [facility])    2017   PAD (peripheral artery disease) ([facility])    Stroke ([facility])    TIA   TIA (transient ischemic attack)    over 20 years ago    Assessment: 66yo male had Eliquis for h/o Afib reversed for urgent ileocecectomy/ex-lap. Plan to bridge with IV heparin for 2-3 days before resuming apixaban.   Heparin level =0.46 remains therapeutic on heparin 1350 units/hr. Likely to change back to apixaban tomorrow, if he tolerates po's today per surgery. He is also on D5 zosyn. Plan for 5d.   Heparin was held this AM due to bloody bowel movement. Repeat CBC came back with a hgb of 9.8. Ok to resume heparin without bolus per Dr. Waldron [facility]. We will use a modified goal.   Hgb 9.8, plt 131k  Goal of  Therapy:  Heparin level: 0.3-0.5 Monitor platelets by anticoagulation protocol: Yes   Plan:  Resume heparin at 1350 units/hr 6 hr HL then daily F/u resume oral AC  Onnie Boer, PharmD, BCIDP, AAHIVP, CPP Infectious Disease Pharmacist 04/24/2021 1:34 PM

## 2021-04-24 NOTE — Plan of Care (Signed)

## 2021-04-24 NOTE — Progress Notes (Signed)
ANTICOAGULATION CONSULT NOTE -follow up  Pharmacy Consult for heparin Indication: Afib  Allergies  Allergen Reactions   Vicodin [Hydrocodone-Acetaminophen] Nausea And Vomiting    Patient Measurements: Height: 6' (182.9 cm) Weight: 81.6 kg (180 lb) IBW/kg (Calculated) : 77.6 Heparin dosing weight:  =TBW 81.6 kg   Vital Signs: Temp: 98.8 F (37.1 C) (01/27 1933) Temp Source: Oral (01/27 1933) BP: 146/78 (01/27 2139) Pulse Rate: 74 (01/27 2139)  Labs: Recent Labs    04/22/21 0225 04/23/21 0125 04/24/21 0127 04/24/21 1224 04/24/21 2024  HGB 10.0* 9.6* 9.4* 9.8*  --   HCT 28.9* 28.2* 27.7* 28.3*  --   PLT 111* 109* 132* 131*  --   HEPARINUNFRC 0.44 0.46 0.33  --  0.27*  CREATININE 1.22 1.17 1.01  --   --      Estimated Creatinine Clearance: 79 mL/min (by C-G formula based on SCr of 1.01 mg/dL).   Medical History: Past Medical History:  Diagnosis Date   AKI (acute kidney injury) ([facility]) 12/2015   Anxiety    Arthritis    Carotid artery stenosis    Coronary artery disease    Dementia ([place])    problems with short term memory   Depression    DKA (diabetic ketoacidosis) ([place]) 04/16/2021   Dysrhythmia    episode of afib 12/2005 in setting of cocaine use   Gout    Hyperlipidemia    Hypertension    Myocardial infarction [facility])    2017   PAD (peripheral artery disease) ([facility])    Stroke ([facility])    TIA   TIA (transient ischemic attack)    over 20 years ago    Assessment: 66yo male had Eliquis for h/o Afib reversed for urgent ileocecectomy/ex-lap. Plan to bridge with IV heparin for 2-3 days before resuming apixaban.   Heparin level =0.46 remains therapeutic on heparin 1350 units/hr. Likely to change back to apixaban tomorrow, if he tolerates po's today per surgery. He is also on D5 zosyn. Plan for 5d.   Heparin was held this AM due to bloody bowel movement. Repeat CBC came back with a hgb of 9.8. Ok to resume heparin without bolus per Dr. Waldron Labs. We will use a  modified goal.   Heparin resumed @14 :00 at previous rate 1350 units/hr.    The  6 hour HL = 0.27 after heparin restarted today.  Goal HL is 0.3-0.5.  Hgb 9.4>9.8, plt 132>131k. No bleeding reported since this AM.  HL has been therapeutic on 1350 units/hr since 1/23.  Heparin level likely to increase some by tomorrow AM as this current heparin level is only 6 hours after heparin resumed.  To be cautious, I will continue at current rate and recheck HL in AM.   Goal of Therapy:  Heparin level: 0.3-0.5 Monitor platelets by anticoagulation protocol: Yes   Plan:  Continue IV heparin 1350 units/hr Check HL and CBC in AM  Monitor for s/sx of bleeding. F/u resume oral AC  Thank you for allowing pharmacy to be part of this patients care team. Nicole Cella, RPh Clinical Pharmacist 04/24/2021 9:44 PM  Please check AMION for all [place] phone numbers After 10:00 PM, call [place]

## 2021-04-24 NOTE — Progress Notes (Signed)
Notified MD of liquid bowel movement of blood-approximately 250 ml. Bright red with no clots per. Pt stated pain is an 8/ 10. 1 mg of dilaudid given. Pending CT scan. Heparin stopped. Vitals within normal limits will continue to monitor pt

## 2021-04-24 NOTE — Progress Notes (Addendum)
PHARMACY - TOTAL PARENTERAL NUTRITION CONSULT NOTE   Indication: Small bowel obstruction  Patient Measurements: Height: 6' (182.9 cm) Weight: 81.6 kg (180 lb) IBW/kg (Calculated) : 77.6 TPN AdjBW (KG): 81.6 Body mass index is 24.41 kg/m. Usual Weight:    Assessment: Admitted with new-onset DM and DKA. Then had acute onset of abdominal pain and was found to have new ischemic bowel he underwent laparotomy with ileocecal resection  on 04/19/21. - New Bloody BM noted 1/27 overnight + abdominal pain with SBO on CT.   Glucose / Insulin: DM on SSI, Semglee 8 units/d, CBGs up to 208, (12 units/24h) . DKA improved. Electrolytes: K 3.6, Mg 1.6 low, Ca 7.3 adjusts to 9.06 Renal: BUN 7, Scr 1, UOP 2100 Hepatic: Albumin 1.8, other LFT's WNL (on sch'd APAP 650mg /6h, Lipitor80,) Intake / Output; MIVF: LR + 16meq K+ at 71ml/hr, LBM 1/27, bloody. GI Imaging: - 1/22: CT: Wall thickening and adjacent inflammatory fat stranding of the cecal base and terminal ileum, involving approximately 20 cm of the terminal ileum, consistent with nonspecific infectious or inflammatory terminal ileitis, - 1/27: Abd CT: concerning for SBO  GI Surgeries / Procedures:  - 1/22: acute onset of abdominal pain and was found to have new ischemic bowel he underwent laparotomy with ileocecal resection  Central access: PICC 1/27 TPN start date:  04/24/21  Nutritional Goals: Goal TPN rate is 82 mL/hr (provides 108 g of protein and 2129 kcals per day)  RD Assessment: Estimated Needs Total Energy Estimated Needs: 2100-2300 Total Protein Estimated Needs: 105-120 grams Total Fluid Estimated Needs: >/= 2.1 L  Current Nutrition:  Glucerna TID (3x given 1/26) TPN starting 1/27  Plan:  Start TPN at 40 mL/hr at 1800 will provide 52g protein and 1038 kcal. Electrolytes in TPN: Na 40mEq/L, K 69mEq/L, Ca 58mEq/L, Mg 62mEq/L, and Phos 26mmol/L. Cl:Ac 1:1 standard. Add standard MVI and trace elements to TPN Initiate Sensitive q6h  SSI and adjust as needed  Add 8 units of insulin to TPN Reduce MIVF to 35 L/hr at 1800 Monitor TPN labs on Mon/Thurs, and PRN Magnesium Sulfate 4g IV x 1   Andriea Hasegawa S. Alford Highland, PharmD, BCPS Clinical Staff Pharmacist [email or web address] Alford Highland, Paticia Moster [place] 04/24/2021,10:05 AM

## 2021-04-24 NOTE — Progress Notes (Signed)
Peripherally Inserted Central Catheter Placement  The IV Nurse has discussed with the patient and/or persons authorized to consent for the patient, the purpose of this procedure and the potential benefits and risks involved with this procedure.  The benefits include less needle sticks, lab draws from the catheter, and the patient may be discharged home with the catheter. Risks include, but not limited to, infection, bleeding, blood clot (thrombus formation), and puncture of an artery; nerve damage and irregular heartbeat and possibility to perform a PICC exchange if needed/ordered by physician.  Alternatives to this procedure were also discussed.  Bard Power PICC patient education guide, fact sheet on infection prevention and patient information card has been provided to patient /or left at bedside.    PICC Placement Documentation  PICC Double Lumen 04/24/21 PICC Right Brachial 38 cm 0 cm (Active)  Indication for Insertion or Continuance of Line Administration of hyperosmolar/irritating solutions (i.e. TPN, Vancomycin, etc.) 04/24/21 1100  Exposed Catheter (cm) 0 cm 04/24/21 1100  Site Assessment Clean;Dry;Intact 04/24/21 1100  Lumen #1 Status Flushed;Saline locked;Blood return noted 04/24/21 1100  Lumen #2 Status Flushed;Saline locked;Blood return noted 04/24/21 1100  Dressing Type Transparent;Securing device 04/24/21 1100  Dressing Status Clean;Dry;Intact 04/24/21 1100  Antimicrobial disc in place? Yes 04/24/21 1100  Safety Lock Not Applicable 79/39/03 0092  Dressing Intervention New dressing 04/24/21 1100  Dressing Change Due 05/01/21 04/24/21 1100       Holley Bouche Renee 04/24/2021, 11:41 AM

## 2021-04-24 NOTE — Progress Notes (Signed)
ANTICOAGULATION CONSULT NOTE -follow up  Pharmacy Consult for heparin Indication: AF  Allergies  Allergen Reactions   Vicodin [Hydrocodone-Acetaminophen] Nausea And Vomiting    Patient Measurements: Height: 6' (182.9 cm) Weight: 81.6 kg (180 lb) IBW/kg (Calculated) : 77.6 Heparin dosing weight:  =TBW 81.6 kg   Vital Signs: Temp: 97.6 F (36.4 C) (01/27 0841) Temp Source: Oral (01/27 0841) BP: 128/57 (01/27 0841) Pulse Rate: 65 (01/27 0841)  Labs: Recent Labs    04/21/21 1214 04/21/21 1214 04/22/21 0225 04/23/21 0125 04/24/21 0127  HGB  --    < > 10.0* 9.6* 9.4*  HCT  --   --  28.9* 28.2* 27.7*  PLT  --   --  111* 109* 132*  APTT 77*  --   --   --   --   HEPARINUNFRC 0.53  --  0.44 0.46 0.33  CREATININE  --   --  1.22 1.17 1.01   < > = values in this interval not displayed.     Estimated Creatinine Clearance: 79 mL/min (by C-G formula based on SCr of 1.01 mg/dL).   Medical History: Past Medical History:  Diagnosis Date   AKI (acute kidney injury) ([place]) 12/2015   Anxiety    Arthritis    Carotid artery stenosis    Coronary artery disease    Dementia ([place])    problems with short term memory   Depression    DKA (diabetic ketoacidosis) ([place]) 04/16/2021   Dysrhythmia    episode of afib 12/2005 in setting of cocaine use   Gout    Hyperlipidemia    Hypertension    Myocardial infarction [facility])    2017   PAD (peripheral artery disease) ([facility])    Stroke ([facility])    TIA   TIA (transient ischemic attack)    over 20 years ago    Assessment: 66yo male had Eliquis for h/o Afib reversed for urgent ileocecectomy/ex-lap. Plan to bridge with IV heparin for 2-3 days before resuming apixaban.   Heparin level =0.46 remains therapeutic on heparin 1350 units/hr. Likely to change back to apixaban tomorrow, if he tolerates po's today per surgery. He is also on D5 zosyn. Plan for 5d.   Heparin was held this AM due to bloody bowel movement. We will follow up when stable to  resume.   Hgb 9.4, plt 132k  Goal of Therapy:  Heparin level: 0.3-0.5 Monitor platelets by anticoagulation protocol: Yes   Plan:  F/u resume heparin or apixaban  Onnie Boer, PharmD, BCIDP, AAHIVP, CPP Infectious Disease Pharmacist 04/24/2021 9:27 AM

## 2021-04-24 NOTE — Progress Notes (Signed)
Notified by RN that pt had liquid bowel movement of blood-approximately 250 ml. Bright red with no clots per RN.  Pt with severe abdominal pain.  Pt states pain and urge to have BM woke him up and he had the blood per rectum. Continues to have 8/10 pain despite dilaudid an hour ago.  Pt with ischemic bowel and had exploratory laparotomy on 04/19/21.   Is on heparin infusion.   Abdomen-Soft, mild distension, diffuse tenderness to palpation.  Incision in lower midline of abdomen without drainage or bleeding has bowel sounds Held Heparin infusion for now.  Given one extra dose of dilaudid 1 mg now. Obtain CT abdomen pelvis to evaluate with increase in pain in abdomen at this time.

## 2021-04-24 NOTE — Progress Notes (Signed)
PROGRESS NOTE                                                                                                                                                                                                             Patient Demographics:    [name], is a 66 y.o. male, DOB - 1955/09/09, NVB:[id number]  Outpatient Primary MD for the patient is [name], MD    LOS - 8  Admit date - 04/16/2021    Chief Complaint  Patient presents with   Hyperglycemia       Brief Narrative (HPI from H&P)     66 y.o. male with medical history significant of CAD, HTN.  He presented to the ER with about 5 to 7-day history of feeling weak with increase in urination and thirst along with some tremors.  In the ER he was diagnosed with new onset DM type II with DKA and admitted to the hospital.  He was treated for DKA and stabilized he was about to be discharged on 04/19/2021 but right before being discharged he developed acute onset of abdominal pain and was found to have new ischemic bowel he underwent laparotomy with ileocecal resection and now recovering from the surgery.   Subjective:   And had 1 bloody bowel movement overnight, and he has been complaining of worsening abdominal pain.       Assessment  & Plan :    No notes have been filed under this hospital service. Service: Hospitalist   New diagnosis of DM type II with DKA on presentation - with severe dehydration - AKI, pseudohyponatremia and metabolic acidosis due to DKA.   -He was treated with DKA protocol along with aggressive IV fluids and electrolyte replacement, gap is closed he has been transitioned to Lantus and sliding scale, dose adjusted on 04/20/2021 as he is n.p.o. postop.  Continue to monitor and adjust closely. -Currently controlled on 8 units of Lantus and insulin sliding scale.  He is on TPN, has been transitioned to added insulin to TPN.  Continue with  sliding scale every 6 hours.  CBG (last 3)  Recent Labs    04/24/21 0204 04/24/21 0839 04/24/21 1335  GLUCAP 168* 154* 139*   Lab Results  Component Value Date   HGBA1C >15.5 (H) 04/19/2021     Ischemic bowel /embolic mesenteric ischemia -  patient sudden onset of periumbilical abdominal pain on 04/19/2021.  Work-up suggestive of ischemic bowel, seen by vascular surgery and general surgery . -  s/p dx lap converted to ex lap with ileocecectomy for 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 Thermon Leyland 1/22 - Continue hydration, continue heparin drip.  He has plenty of risk factors which are PAD,CAD, Smoking, Afib - Heparin drip resumed on 04/20/2021 after discussions with general surgery.  On IV Zosyn. -Started on diet, but he is kept n.p.o. currently likely postoperative SBO versus ileus, now no nausea no vomiting, so we will hold on NG tube per general surgery recommendation, but if he develops any then he will need NG tube on suction -He had 1 episode of blood in BM, this is most likely old blood as H&H has been stable, will rechallenge again with heparin GTT with no bolus this afternoon as his initial complaints felt to be secondary to embolic mesenteric ischemia.  Paroxysmal atrial fibrillation.  Continue beta-blocker and heparin instead of Eliquis in the perioperative period.  Mali vas 2 score of greater than 3.  Hypertension.  Blood pressure is better on beta-blocker monitor add as needed hydralazine..  Dyslipidemia on statin and Vascepa.  AKI on CKD 3A with baseline creatinine of 1.5, AKI due to severe dehydration - holding ARB and HCTZ, has been aggressively hydrated with IV fluids and AKI seems to have resolved.  Hypokalemia -potassium is low at 3.3 today, repleted.  Hypomagnesemia.  Aggressively replaced.  H/O gout.  On allopurinol.  Encouraged the patient to sit up in chair in the daytime use I-S and flutter valve for  pulmonary toiletry.  Will advance activity and titrate down oxygen as possible.         Condition - Fair  Family Communication  : None at bedside  Code Status :  Full  Consults  :  None  PUD Prophylaxis : PPI   Procedures  :     Exploratory laparotomy with Ileocecectomy - by [facility] 04/20/21   CT - 1. Mild circumferential wall thickening of the distal ileum with adjacent inflammation and small amount of free fluid. This is nonspecific but may represent an infectious or inflammatory enteritis. Given very heavy mesenteric arterial calcifications, ischemia is not entirely excluded but no evidence of pneumatosis intestinalis or pneumoperitoneum. Mildly distended loops of fluid-filled small bowel proximally without transition point, which may represent a mild ileus or low-grade partial obstruction at the distal ileum. 2. Mild bilateral LOWER lobe cylindrical bronchiectasis with minimal tree in bud opacities in the LEFT LOWER lobe, likely representing a mild infectious or inflammatory process. 3. Coronary artery disease and aortic Atherosclerosis (ICD10-I70.0).       Disposition Plan  :    Status is: Inpatient  Remains inpatient appropriate because: DKA  DVT Prophylaxis  :    Place TED hose Start: 04/18/21 1101     Lab Results  Component Value Date   PLT 131 (L) 04/24/2021    Diet :  Diet Order             Diet NPO time specified Except for: Ice Chips, Sips with Meds  Diet effective now                    Inpatient Medications  Scheduled Meds:  acetaminophen  650 mg Oral Q6H   allopurinol  100 mg Oral Daily   atorvastatin  80 mg Oral QHS   Chlorhexidine Gluconate Cloth  6 each Topical Daily   docusate sodium  100 mg Oral BID   escitalopram  20 mg Oral Daily   icosapent Ethyl  2 g Oral BID   insulin aspart  0-9 Units Subcutaneous Q6H   insulin glargine-yfgn  8 Units Subcutaneous Daily   insulin starter kit- pen needles  1 kit Other Once   living well with  diabetes book   Does not apply Once   metoprolol tartrate  25 mg Oral BID   pantoprazole  40 mg Oral Daily   sodium chloride flush  10-40 mL Intracatheter Q12H   traZODone  100 mg Oral QHS   Continuous Infusions:  heparin 1,350 Units/hr (04/24/21 1400)   lactated ringers with kcl     methocarbamol (ROBAXIN) IV     TPN ADULT (ION)      PRN Meds:.busPIRone, dextrose, hydrALAZINE, HYDROcodone-acetaminophen, HYDROmorphone (DILAUDID) injection, hydrOXYzine, methocarbamol (ROBAXIN) IV, metoprolol tartrate, morphine injection, ondansetron (ZOFRAN) IV, oxyCODONE, oxyCODONE, prochlorperazine, simethicone, sodium chloride flush  Antibiotics  :    Anti-infectives (From admission, onward)    Start     Dose/Rate Route Frequency Ordered Stop   04/20/21 0200  piperacillin-tazobactam (ZOSYN) IVPB 3.375 g  Status:  Discontinued        3.375 g 12.5 mL/hr over 240 Minutes Intravenous Every 8 hours 04/19/21 1649 04/20/21 0105   04/20/21 0200  piperacillin-tazobactam (ZOSYN) IVPB 3.375 g        3.375 g 12.5 mL/hr over 240 Minutes Intravenous Every 8 hours 04/20/21 0105 04/24/21 0314   04/20/21 0000  piperacillin-tazobactam (ZOSYN) IVPB 3.375 g  Status:  Discontinued        3.375 g 100 mL/hr over 30 Minutes Intravenous Every 6 hours 04/19/21 2334 04/20/21 0105   04/19/21 1745  piperacillin-tazobactam (ZOSYN) IVPB 3.375 g        3.375 g 100 mL/hr over 30 Minutes Intravenous  Once 04/19/21 1649 04/19/21 1910      \   Emeline Gins Kerry-Anne Mezo M.D on 04/24/2021 at 2:11 PM  To page go to [email or web address]   [facility] -  Office  [phone]  See all Orders from today for further details    Objective:   Vitals:   04/24/21 0422 04/24/21 0841 04/24/21 0928 04/24/21 1208  BP: 111/66 (!) 128/57 126/73 (!) 148/70  Pulse: 67 65 66 60  Resp: _0 Temp: 98.2 F (36.8 C) 97.6 F (36.4 C)  97.6 F (36.4 C)  TempSrc: Oral Oral  Oral  SpO2: 94% 97% 94% 96%  Weight:      Height:        Wt  Readings from Last 3 Encounters:  04/16/21 81.6 kg  03/12/21 85.3 kg  09/09/20 86.8 kg     Intake/Output Summary (Last 24 hours) at 04/24/2021 1411 Last data filed at 04/24/2021 1100 Gross per 24 hour  Intake 720 ml  Output 1800 ml  Net -1080 ml     Physical Exam  Awake Alert, Oriented X 3, No new F.N deficits, Normal affect Symmetrical Chest wall movement, Good air movement bilaterally, CTAB RRR,No Gallops,Rubs or new Murmurs, No Parasternal Heave +ve B.Sounds, Abd Soft, postoperative wound covered with honeycomb mesh, abdomen appropriately postoperative tender No Cyanosis, Clubbing or edema, No new Rash or bruise       Data Review:    CBC Recent Labs  Lab 04/18/21 0052 04/19/21 0117 04/20/21 0126 04/21/21 0443 04/22/21 0225 04/23/21 0125 04/24/21 0127 04/24/21 1224  WBC 9.2 8.5   < >  6.4 7.3 6.0 6.8 7.1  HGB 15.0 12.7*   < > 10.6* 10.0* 9.6* 9.4* 9.8*  HCT 40.4 34.9*   < > 30.3* 28.9* 28.2* 27.7* 28.3*  PLT 132* 101*   < > 91* 111* 109* 132* 131*  MCV 88.4 90.6   < > 95.0 95.7 96.6 95.2 95.0  MCH 32.8 33.0   < > 33.2 33.1 32.9 32.3 32.9  MCHC 37.1* 36.4*   < > 35.0 34.6 34.0 33.9 34.6  RDW 12.7 12.6   < > 13.3 13.3 13.2 13.2 13.3  LYMPHSABS 1.0 1.8  --   --   --   --   --   --   MONOABS 0.9 0.8  --   --   --   --   --   --   EOSABS 0.0 0.0  --   --   --   --   --   --   BASOSABS 0.0 0.0  --   --   --   --   --   --    < > = values in this interval not displayed.    Electrolytes Recent Labs  Lab 04/18/21 0729 04/18/21 1431 04/19/21 0117 04/19/21 1710 04/19/21 1943 04/20/21 0126 04/21/21 0443 04/22/21 0225 04/23/21 0125 04/24/21 0127  NA  --   --  136  --   --  136 138 138 138 137  K  --   --  3.0*  --   --  3.5 3.8 3.9 3.3* 3.6  CL  --   --  103  --   --  105 109 106 108 106  CO2  --   --  23  --   --  16* _0 GLUCOSE  --   --  88  --   --  148* 112* 90 160* 165*  BUN  --   --  26*  --   --  _1 7*  CREATININE  --   --  1.33*   --   --  1.42* 1.19 1.22 1.17 1.01  CALCIUM  --   --  7.8*  --   --  7.0* 6.8* 7.3* 7.2* 7.3*  AST  --   --  24  --   --  _2 ALT  --   --  18  --   --  _3 ALKPHOS  --   --  60  --   --  51 48 80 67 83  BILITOT  --   --  1.2  --   --  1.0 0.7 0.4 0.4 0.4  ALBUMIN  --   --  2.4*  --   --  2.4* 1.9* 1.8* 1.7* 1.8*  MG  --   --  2.1  --   --  1.7 2.0 1.8 1.4* 1.6*  CRP  --   --   --  18.7*  --  19.5*  --   --   --   --   DDIMER  --   --   --  6.43*  --  7.14*  --   --   --   --   PROCALCITON  --   --  0.86  --   --  0.57 1.38 0.92 0.66 0.43  LATICACIDVEN  --   --   --  1.3 1.2  --   --   --   --   --  HGBA1C  --    < > >15.5*  --   --   --   --   --   --   --   BNP 315.9*  --   --   --   --   --   --   --   --   --    < > = values in this interval not displayed.    ------------------------------------------------------------------------------------------------------------------ No results for input(s): CHOL, HDL, LDLCALC, TRIG, CHOLHDL, LDLDIRECT in the last 72 hours.  Lab Results  Component Value Date   HGBA1C >15.5 (H) 04/19/2021    No results for input(s): TSH, T4TOTAL, T3FREE, THYROIDAB in the last 72 hours.  Invalid input(s): FREET3  ------------------------------------------------------------------------------------------------------------------ ID Labs Recent Labs  Lab 04/19/21 1710 04/19/21 1943 04/20/21 0126 04/21/21 0443 04/22/21 0225 04/23/21 0125 04/24/21 0127 04/24/21 1224  WBC  --   --  4.1 6.4 7.3 6.0 6.8 7.1  PLT  --   --  96* 91* 111* 109* 132* 131*  CRP 18.7*  --  19.5*  --   --   --   --   --   DDIMER 6.43*  --  7.14*  --   --   --   --   --   PROCALCITON  --   --  0.57 1.38 0.92 0.66 0.43  --   LATICACIDVEN 1.3 1.2  --   --   --   --   --   --   CREATININE  --   --  1.42* 1.19 1.22 1.17 1.01  --    Cardiac Enzymes No results for input(s): CKMB, TROPONINI, MYOGLOBIN in the last 168 hours.  Invalid input(s): CK  Radiology  Reports CT ABDOMEN PELVIS W CONTRAST  Result Date: 04/24/2021 CLINICAL DATA:  Abdominal pain. EXAM: CT ABDOMEN AND PELVIS WITH CONTRAST TECHNIQUE: Multidetector CT imaging of the abdomen and pelvis was performed using the standard protocol following bolus administration of intravenous contrast. RADIATION DOSE REDUCTION: This exam was performed according to the departmental dose-optimization program which includes automated exposure control, adjustment of the mA and/or kV according to patient size and/or use of iterative reconstruction technique. CONTRAST:  134mL OMNIPAQUE IOHEXOL 300 MG/ML  SOLN COMPARISON:  CTA abdomen and pelvis 04/19/2021 FINDINGS: Lower chest: Left lower lobe posterior basal infiltrates have partially resolved in the interval. Irregular ground-glass opacity is noted in the lateral base of the right upper lobe. This is above the plane of both prior studies. There are small left and trace right pleural effusions. Other lung bases are clear. There is mild cardiomegaly with heavy calcification in the coronary arteries. Hepatobiliary: No mass is seen. Gallbladder and bile ducts are unremarkable. Pancreas: No mass or ductal dilatation. Scattered punctate calcifications of chronic pancreatitis. No evidence of acute pancreatitis. Spleen: Normal. Adrenals/Urinary Tract: Mild nodular thickening left adrenal gland. Right adrenal is unremarkable. The renal cortex unremarkable aside from a 2.6 cm cyst in the posterior left kidney. There are renovascular calcifications about the right renal hilum but no evidence of urinary stones or obstruction. Unremarkable bladder. Stomach/Bowel: Interval laparotomy with overlying skin staples. Subincisional surgical drain noted in the midline. Interval removal of the proximal ascending colon with end to side ileocolic surgical anastomosis. The anastomotic segment is fairly decompressed, but there is a caliber change from decompressed to dilated caliber in the anterior  left mid to lower abdomen off the midline, 20-23 cm proximal to the surgical ileocolic junction, where there is a sharply angulated small  bowel loop on axial images 53-56 of series 3, and coronal reconstruction image 24 of series 7. Above this level there is small bowel dilatation up to 4 cm, with backup into the proximal small bowel and gastric fluid distention. This is concerning for small bowel obstruction due to adhesive disease or postsurgical kinking. Some of the left mid abdominal small bowel segments posteriorly are thick-walled but this seems to have been present previously such as due to a chronic enteritis or enteropathy. No bowel pneumatosis or portal venous gas is evident. Large intestine is decompressed without wall thickening with uncomplicated scattered diverticula. Vascular/Lymphatic: There are heavy vascular calcifications. This includes at the SMA origin and mid to distal vessel past the inflection point with stenting of the right renal artery again shown and moderate aortoiliac mixed plaques. There is no AAA. Reproductive: No prostatomegaly. Other: There is mild body wall anasarca, mesenteric edema which could be postsurgical such as due to fluid overload or due to peritonitis. There is mild abdominal and pelvic free ascites. There is trace free intraperitoneal air in the mid abdomen underlying the incision. No abscess is seen. Mild scattered fluid in the mesenteric folds is also noted. No incarcerated hernia. Musculoskeletal: There are degenerative changes of the spine. No suspicious bone lesions IMPRESSION: 1. Interval removal of the cecum and presumably the terminal ileum which previously showed inflammatory change and wall thickening. Small part of the proximal ascending colon was also removed. 2. 20-23 cm proximal to the surgical ileocolic junction in the anterior mid/lower abdomen off midline there is a sharply angulated small bowel segment, above which the small bowel is diffusely dilated  up to 4 cm with backed up fluid distending the stomach. This is concerning for small bowel obstruction due to postsurgical adhesions or postsurgical kinking. 3. Some of the more proximal small bowel segments show fold thickening but this does appear to have been present previously. Superimposed enteritis or ischemic wall edema is not strictly excluded. No pneumatosis or portal venous gas. 4. Mild abdominal and pelvic free fluid. Only trace free air, probably related to the surgery. 5. Heavy vascular calcifications, including the SMA. 6. Patchy mesenteric edema and body wall anasarca, both probably due to fluid overload and/or postsurgical mesenteric edema. Underlying peritonitis not strictly excluded. 7. Left lower lobe posterior basal infiltrates partially cleared. Small left and trace right pleural effusions. 8. Small irregular ground-glass opacity, lateral base of the right upper lobe, could be atelectasis or pneumonitis. Follow-up recommended. Electronically Signed   By: Telford Nab M.D.   On: 04/24/2021 04:08   [place] EKG SITE RITE  Result Date: 04/24/2021 If Site Rite image not attached, placement could not be confirmed due to current cardiac rhythm.    Patient ID: [name], male   DOB: 27-Jun-1955, 66 y.o.   MRN: [id number] Patient ID: [name], male   DOB: 1955-07-28, 66 y.o.   MRN: [id number] Patient ID: [name], male   DOB: 12-08-1955, 66 y.o.   MRN: [id number]

## 2021-04-24 NOTE — Progress Notes (Signed)
5 Days Post-Op   Subjective/Chief Complaint: Old bloody bm overnight and had a ct scan that shows possible postop sbo, no n/v, has some ab pain, was passing flatus none this am   Objective: Vital signs in last 24 hours: Temp:  [97.3 F (36.3 C)-98.2 F (36.8 C)] 97.6 F (36.4 C) (01/27 0841) Pulse Rate:  [60-77] 66 (01/27 0928) Resp:  [15-20] 17 (01/27 0928) BP: (111-157)/(57-84) 126/73 (01/27 0928) SpO2:  [94 %-98 %] 94 % (01/27 0928) Last BM Date: 04/24/21  Intake/Output from previous day: 01/26 0701 - 01/27 0700 In: 720 [P.O.:720] Out: 2100 [Urine:2100] Intake/Output this shift: No intake/output data recorded.  GI: obese wound without infection, some bs not really distended and approp tender  Lab Results:  Recent Labs    04/23/21 0125 04/24/21 0127  WBC 6.0 6.8  HGB 9.6* 9.4*  HCT 28.2* 27.7*  PLT 109* 132*   BMET Recent Labs    04/23/21 0125 04/24/21 0127  NA 138 137  K 3.3* 3.6  CL 108 106  CO2 25 24  GLUCOSE 160* 165*  BUN 9 7*  CREATININE 1.17 1.01  CALCIUM 7.2* 7.3*   PT/INR No results for input(s): LABPROT, INR in the last 72 hours. ABG No results for input(s): PHART, HCO3 in the last 72 hours.  Invalid input(s): PCO2, PO2  Studies/Results: CT ABDOMEN PELVIS W CONTRAST  Result Date: 04/24/2021 CLINICAL DATA:  Abdominal pain. EXAM: CT ABDOMEN AND PELVIS WITH CONTRAST TECHNIQUE: Multidetector CT imaging of the abdomen and pelvis was performed using the standard protocol following bolus administration of intravenous contrast. RADIATION DOSE REDUCTION: This exam was performed according to the departmental dose-optimization program which includes automated exposure control, adjustment of the mA and/or kV according to patient size and/or use of iterative reconstruction technique. CONTRAST:  181mL OMNIPAQUE IOHEXOL 300 MG/ML  SOLN COMPARISON:  CTA abdomen and pelvis 04/19/2021 FINDINGS: Lower chest: Left lower lobe posterior basal infiltrates have  partially resolved in the interval. Irregular ground-glass opacity is noted in the lateral base of the right upper lobe. This is above the plane of both prior studies. There are small left and trace right pleural effusions. Other lung bases are clear. There is mild cardiomegaly with heavy calcification in the coronary arteries. Hepatobiliary: No mass is seen. Gallbladder and bile ducts are unremarkable. Pancreas: No mass or ductal dilatation. Scattered punctate calcifications of chronic pancreatitis. No evidence of acute pancreatitis. Spleen: Normal. Adrenals/Urinary Tract: Mild nodular thickening left adrenal gland. Right adrenal is unremarkable. The renal cortex unremarkable aside from a 2.6 cm cyst in the posterior left kidney. There are renovascular calcifications about the right renal hilum but no evidence of urinary stones or obstruction. Unremarkable bladder. Stomach/Bowel: Interval laparotomy with overlying skin staples. Subincisional surgical drain noted in the midline. Interval removal of the proximal ascending colon with end to side ileocolic surgical anastomosis. The anastomotic segment is fairly decompressed, but there is a caliber change from decompressed to dilated caliber in the anterior left mid to lower abdomen off the midline, 20-23 cm proximal to the surgical ileocolic junction, where there is a sharply angulated small bowel loop on axial images 53-56 of series 3, and coronal reconstruction image 24 of series 7. Above this level there is small bowel dilatation up to 4 cm, with backup into the proximal small bowel and gastric fluid distention. This is concerning for small bowel obstruction due to adhesive disease or postsurgical kinking. Some of the left mid abdominal small bowel segments posteriorly are thick-walled  but this seems to have been present previously such as due to a chronic enteritis or enteropathy. No bowel pneumatosis or portal venous gas is evident. Large intestine is decompressed  without wall thickening with uncomplicated scattered diverticula. Vascular/Lymphatic: There are heavy vascular calcifications. This includes at the SMA origin and mid to distal vessel past the inflection point with stenting of the right renal artery again shown and moderate aortoiliac mixed plaques. There is no AAA. Reproductive: No prostatomegaly. Other: There is mild body wall anasarca, mesenteric edema which could be postsurgical such as due to fluid overload or due to peritonitis. There is mild abdominal and pelvic free ascites. There is trace free intraperitoneal air in the mid abdomen underlying the incision. No abscess is seen. Mild scattered fluid in the mesenteric folds is also noted. No incarcerated hernia. Musculoskeletal: There are degenerative changes of the spine. No suspicious bone lesions IMPRESSION: 1. Interval removal of the cecum and presumably the terminal ileum which previously showed inflammatory change and wall thickening. Small part of the proximal ascending colon was also removed. 2. 20-23 cm proximal to the surgical ileocolic junction in the anterior mid/lower abdomen off midline there is a sharply angulated small bowel segment, above which the small bowel is diffusely dilated up to 4 cm with backed up fluid distending the stomach. This is concerning for small bowel obstruction due to postsurgical adhesions or postsurgical kinking. 3. Some of the more proximal small bowel segments show fold thickening but this does appear to have been present previously. Superimposed enteritis or ischemic wall edema is not strictly excluded. No pneumatosis or portal venous gas. 4. Mild abdominal and pelvic free fluid. Only trace free air, probably related to the surgery. 5. Heavy vascular calcifications, including the SMA. 6. Patchy mesenteric edema and body wall anasarca, both probably due to fluid overload and/or postsurgical mesenteric edema. Underlying peritonitis not strictly excluded. 7. Left lower  lobe posterior basal infiltrates partially cleared. Small left and trace right pleural effusions. 8. Small irregular ground-glass opacity, lateral base of the right upper lobe, could be atelectasis or pneumonitis. Follow-up recommended. Electronically Signed   By: Telford Nab M.D.   On: 04/24/2021 04:08   [place] EKG SITE RITE  Result Date: 04/24/2021 If Site Rite image not attached, placement could not be confirmed due to current cardiac rhythm.   Anti-infectives: Anti-infectives (From admission, onward)    Start     Dose/Rate Route Frequency Ordered Stop   04/20/21 0200  piperacillin-tazobactam (ZOSYN) IVPB 3.375 g  Status:  Discontinued        3.375 g 12.5 mL/hr over 240 Minutes Intravenous Every 8 hours 04/19/21 1649 04/20/21 0105   04/20/21 0200  piperacillin-tazobactam (ZOSYN) IVPB 3.375 g        3.375 g 12.5 mL/hr over 240 Minutes Intravenous Every 8 hours 04/20/21 0105 04/24/21 0314   04/20/21 0000  piperacillin-tazobactam (ZOSYN) IVPB 3.375 g  Status:  Discontinued        3.375 g 100 mL/hr over 30 Minutes Intravenous Every 6 hours 04/19/21 2334 04/20/21 0105   04/19/21 1745  piperacillin-tazobactam (ZOSYN) IVPB 3.375 g        3.375 g 100 mL/hr over 30 Minutes Intravenous  Once 04/19/21 1649 04/19/21 1910       Assessment/Plan: POD 5 s/p dx lap converted to ex lap with ileocecectomy for 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 Thermon Leyland 1/22 - I think has ileus on exam and has had bowel function,  if he has any n/v will place ng tube but for now will go without -plan for TPN today -recheck xray in am hopefully ct finding will resolve on own if this does not there is small chance might need lsc -would like to continue heparin due to presumed ischemia as etiology, follow hct, I think this was more old blood. FEN: sips/chips today ID: [email or web address] 1/22 - 1/26 VTE: hep gtt 1/23 >> platelets 123   Per primary: T2DM with DKA AKI Afib on  eliquis baseline HTN  Rolm Bookbinder 04/24/2021

## 2021-04-24 NOTE — Progress Notes (Signed)
Pt pain is a 7/10. CT scan done, no more bloody stools at this time. Vitals within normal limits. Pt is sleeping, no signs of distress noted will continue to monitor pt.

## 2021-04-25 ENCOUNTER — Inpatient Hospital Stay (HOSPITAL_COMMUNITY): Payer: Medicare Other

## 2021-04-25 LAB — CBC
HCT: 27 % — ABNORMAL LOW (ref 39.0–52.0)
Hemoglobin: 9.1 g/dL — ABNORMAL LOW (ref 13.0–17.0)
MCH: 32.4 pg (ref 26.0–34.0)
MCHC: 33.7 g/dL (ref 30.0–36.0)
MCV: 96.1 fL (ref 80.0–100.0)
Platelets: 126 10*3/uL — ABNORMAL LOW (ref 150–400)
RBC: 2.81 MIL/uL — ABNORMAL LOW (ref 4.22–5.81)
RDW: 13.2 % (ref 11.5–15.5)
WBC: 8.1 10*3/uL (ref 4.0–10.5)
nRBC: 0.9 % — ABNORMAL HIGH (ref 0.0–0.2)

## 2021-04-25 LAB — GLUCOSE, CAPILLARY
Glucose-Capillary: 198 mg/dL — ABNORMAL HIGH (ref 70–99)
Glucose-Capillary: 201 mg/dL — ABNORMAL HIGH (ref 70–99)
Glucose-Capillary: 209 mg/dL — ABNORMAL HIGH (ref 70–99)
Glucose-Capillary: 216 mg/dL — ABNORMAL HIGH (ref 70–99)
Glucose-Capillary: 303 mg/dL — ABNORMAL HIGH (ref 70–99)

## 2021-04-25 LAB — MAGNESIUM: Magnesium: 1.8 mg/dL (ref 1.7–2.4)

## 2021-04-25 LAB — COMPREHENSIVE METABOLIC PANEL
ALT: 30 U/L (ref 0–44)
AST: 36 U/L (ref 15–41)
Albumin: 1.8 g/dL — ABNORMAL LOW (ref 3.5–5.0)
Alkaline Phosphatase: 77 U/L (ref 38–126)
Anion gap: 5 (ref 5–15)
BUN: 8 mg/dL (ref 8–23)
CO2: 24 mmol/L (ref 22–32)
Calcium: 7.2 mg/dL — ABNORMAL LOW (ref 8.9–10.3)
Chloride: 108 mmol/L (ref 98–111)
Creatinine, Ser: 0.98 mg/dL (ref 0.61–1.24)
GFR, Estimated: >60 mL/min (ref >60)
Glucose, Bld: 215 mg/dL — ABNORMAL HIGH (ref 70–99)
Potassium: 3.7 mmol/L (ref 3.5–5.1)
Sodium: 137 mmol/L (ref 135–145)
Total Bilirubin: 0.4 mg/dL (ref 0.3–1.2)
Total Protein: 4.3 g/dL — ABNORMAL LOW (ref 6.5–8.1)

## 2021-04-25 LAB — HEPARIN LEVEL (UNFRACTIONATED): Heparin Unfractionated: 0.43 IU/mL (ref 0.30–0.70)

## 2021-04-25 LAB — TRIGLYCERIDES: Triglycerides: 67 mg/dL (ref <150)

## 2021-04-25 LAB — PHOSPHORUS: Phosphorus: 2.2 mg/dL — ABNORMAL LOW (ref 2.5–4.6)

## 2021-04-25 MED ORDER — POTASSIUM PHOSPHATES 15 MMOLE/5ML IV SOLN
20.0000 mmol | Freq: Once | INTRAVENOUS | Status: AC
  Administered 2021-04-25: 20 mmol via INTRAVENOUS
  Filled 2021-04-25: qty 6.67

## 2021-04-25 MED ORDER — INSULIN ASPART 100 UNIT/ML IJ SOLN
0.0000 [IU] | Freq: Four times a day (QID) | INTRAMUSCULAR | Status: DC
  Administered 2021-04-25: 11 [IU] via SUBCUTANEOUS
  Administered 2021-04-25 – 2021-04-26 (×2): 3 [IU] via SUBCUTANEOUS
  Administered 2021-04-26 (×2): 8 [IU] via SUBCUTANEOUS
  Administered 2021-04-26: 3 [IU] via SUBCUTANEOUS
  Administered 2021-04-27: 5 [IU] via SUBCUTANEOUS
  Administered 2021-04-27: 3 [IU] via SUBCUTANEOUS

## 2021-04-25 MED ORDER — INSULIN GLARGINE-YFGN 100 UNIT/ML ~~LOC~~ SOLN
12.0000 [IU] | Freq: Every day | SUBCUTANEOUS | Status: DC
  Administered 2021-04-25: 12 [IU] via SUBCUTANEOUS
  Filled 2021-04-25 (×2): qty 0.12

## 2021-04-25 MED ORDER — OXYCODONE HCL 5 MG PO TABS
5.0000 mg | ORAL_TABLET | ORAL | Status: DC | PRN
  Administered 2021-04-25 – 2021-05-02 (×16): 10 mg via ORAL
  Filled 2021-04-25 (×16): qty 2

## 2021-04-25 MED ORDER — TRAVASOL 10 % IV SOLN
INTRAVENOUS | Status: AC
  Filled 2021-04-25: qty 792

## 2021-04-25 MED ORDER — POTASSIUM CHLORIDE 10 MEQ/50ML IV SOLN
10.0000 meq | INTRAVENOUS | Status: DC
  Filled 2021-04-25 (×4): qty 50

## 2021-04-25 MED ORDER — MAGNESIUM SULFATE 4 GM/100ML IV SOLN
4.0000 g | Freq: Once | INTRAVENOUS | Status: AC
  Administered 2021-04-25: 4 g via INTRAVENOUS
  Filled 2021-04-25: qty 100

## 2021-04-25 MED ORDER — LACTATED RINGERS IV SOLN: INTRAVENOUS | Status: DC

## 2021-04-25 MED ORDER — TRAVASOL 10 % IV SOLN: INTRAVENOUS | Status: DC

## 2021-04-25 MED ORDER — INSULIN ASPART 100 UNIT/ML IJ SOLN: 0.0000 [IU] | Freq: Four times a day (QID) | INTRAMUSCULAR | Status: DC

## 2021-04-25 MED ORDER — HYDROMORPHONE HCL 1 MG/ML IJ SOLN
0.5000 mg | INTRAMUSCULAR | Status: DC | PRN
  Administered 2021-04-25 – 2021-05-02 (×9): 1 mg via INTRAVENOUS
  Filled 2021-04-25 (×9): qty 1

## 2021-04-25 NOTE — Progress Notes (Signed)
PROGRESS NOTE                                                                                                                                                                                                             Patient Demographics:    [name], is a 66 y.o. male, DOB - 05/12/1955, HBZ:[id number]  Outpatient Primary MD for the patient is [name], MD    LOS - 9  Admit date - 04/16/2021    Chief Complaint  Patient presents with   Hyperglycemia       Brief Narrative (HPI from H&P)     66 y.o. male with medical history significant of CAD, HTN.  He presented to the ER with about 5 to 7-day history of feeling weak with increase in urination and thirst along with some tremors.  In the ER he was diagnosed with new onset DM type II with DKA and admitted to the hospital.  He was treated for DKA and stabilized he was about to be discharged on 04/19/2021 but right before being discharged he developed acute onset of abdominal pain and was found to have new ischemic bowel he underwent laparotomy with ileocecal resection and now recovering from the surgery.   Subjective:   Patient reports he is passing flatus, no bowel movements overnight, no nausea, vomiting, reports abdominal pain is at baseline.       Assessment  & Plan :    No notes have been filed under this hospital service. Service: Hospitalist   New diagnosis of DM type II with DKA on presentation - with severe dehydration - AKI, pseudohyponatremia and metabolic acidosis due to DKA. -He was treated with DKA protocol along with aggressive IV fluids and electrolyte replacement, gap is closed he has been transitioned to Lantus and sliding scale, dose adjusted on 04/20/2021 as he is n.p.o. postop.  Continue to monitor and adjust closely. -Currently controlled on 8 units of Lantus and insulin sliding scale.  He is on TPN, has been transitioned to added insulin to  TPN.  Continue with sliding scale every 6 hours.  CBG (last 3)  Recent Labs    04/25/21 0010 04/25/21 0354 04/25/21 1136  GLUCAP 201* 216* 303*   Lab Results  Component Value Date   HGBA1C >15.5 (H) 04/19/2021     Ischemic bowel /embolic mesenteric  ischemia - patient sudden onset of periumbilical abdominal pain on 04/19/2021.  Work-up suggestive of ischemic bowel, seen by vascular surgery and general surgery . -  s/p dx lap converted to ex lap with ileocecectomy for 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 Thermon Leyland 1/22 - Continue hydration, continue heparin drip.  He has plenty of risk factors which are PAD,CAD, Smoking, Afib - Heparin drip resumed on 04/20/2021 after discussions with general surgery.  On IV Zosyn. -Patient had bloody BM 1/26, likely old blood, H&H is stable, resumed back on TTE, especially he presents with embolic ischemic bowel.  Continue to monitor CBC closely. -Patient currently n.p.o., as evidence of postoperative SBO, remains with no nausea or vomiting for now, so we will hold on NG tube per general surgery recommendation. -Started on TPN on 1/27.   Paroxysmal atrial fibrillation.  Continue beta-blocker and heparin instead of Eliquis in the perioperative period.  Mali vas 2 score of greater than 3.  Hypertension.  Blood pressure is better on beta-blocker monitor add as needed hydralazine..  Dyslipidemia on statin and Vascepa.  AKI on CKD 3A with baseline creatinine of 1.5, AKI due to severe dehydration - holding ARB and HCTZ, has been aggressively hydrated with IV fluids and AKI seems to have resolved.  Hypokalemia -potassium is low at 3.3 today, repleted.  Hypomagnesemia.  Aggressively replaced.  H/O gout.  On allopurinol.  Encouraged the patient to sit up in chair in the daytime use I-S and flutter valve for pulmonary toiletry.  Will advance activity and titrate down oxygen as possible.          Condition - Fair  Family Communication  : None at bedside  Code Status :  Full  Consults  :  None  PUD Prophylaxis : PPI   Procedures  :     Exploratory laparotomy with Ileocecectomy - by [facility] 04/20/21   CT - 1. Mild circumferential wall thickening of the distal ileum with adjacent inflammation and small amount of free fluid. This is nonspecific but may represent an infectious or inflammatory enteritis. Given very heavy mesenteric arterial calcifications, ischemia is not entirely excluded but no evidence of pneumatosis intestinalis or pneumoperitoneum. Mildly distended loops of fluid-filled small bowel proximally without transition point, which may represent a mild ileus or low-grade partial obstruction at the distal ileum. 2. Mild bilateral LOWER lobe cylindrical bronchiectasis with minimal tree in bud opacities in the LEFT LOWER lobe, likely representing a mild infectious or inflammatory process. 3. Coronary artery disease and aortic Atherosclerosis (ICD10-I70.0).       Disposition Plan  :    Status is: Inpatient  Remains inpatient appropriate because: DKA  DVT Prophylaxis  :    Place TED hose Start: 04/18/21 1101     Lab Results  Component Value Date   PLT 126 (L) 04/25/2021    Diet :  Diet Order             Diet NPO time specified Except for: Sips with Meds  Diet effective now                    Inpatient Medications  Scheduled Meds:  acetaminophen  650 mg Oral Q6H   allopurinol  100 mg Oral Daily   atorvastatin  80 mg Oral QHS   Chlorhexidine Gluconate Cloth  6 each Topical Daily   docusate sodium  100 mg Oral BID   escitalopram  20 mg Oral Daily  icosapent Ethyl  2 g Oral BID   insulin aspart  0-15 Units Subcutaneous Q6H   insulin glargine-yfgn  12 Units Subcutaneous Daily   insulin starter kit- pen needles  1 kit Other Once   living well with diabetes book   Does not apply Once   metoprolol tartrate  25 mg Oral BID   pantoprazole  40 mg Oral  Daily   sodium chloride flush  10-40 mL Intracatheter Q12H   traZODone  100 mg Oral QHS   Continuous Infusions:  heparin 1,350 Units/hr (04/25/21 0937)   lactated ringers with kcl 35 mL/hr at 04/24/21 1805   lactated ringers     methocarbamol (ROBAXIN) IV     potassium PHOSPHATE IVPB (in mmol) 20 mmol (04/25/21 0942)   TPN ADULT (ION) 40 mL/hr at 04/24/21 1706   TPN ADULT (ION)      PRN Meds:.busPIRone, dextrose, hydrALAZINE, HYDROmorphone (DILAUDID) injection, hydrOXYzine, methocarbamol (ROBAXIN) IV, metoprolol tartrate, ondansetron (ZOFRAN) IV, oxyCODONE, prochlorperazine, simethicone, sodium chloride flush  Antibiotics  :    Anti-infectives (From admission, onward)    Start     Dose/Rate Route Frequency Ordered Stop   04/20/21 0200  piperacillin-tazobactam (ZOSYN) IVPB 3.375 g  Status:  Discontinued        3.375 g 12.5 mL/hr over 240 Minutes Intravenous Every 8 hours 04/19/21 1649 04/20/21 0105   04/20/21 0200  piperacillin-tazobactam (ZOSYN) IVPB 3.375 g        3.375 g 12.5 mL/hr over 240 Minutes Intravenous Every 8 hours 04/20/21 0105 04/24/21 0314   04/20/21 0000  piperacillin-tazobactam (ZOSYN) IVPB 3.375 g  Status:  Discontinued        3.375 g 100 mL/hr over 30 Minutes Intravenous Every 6 hours 04/19/21 2334 04/20/21 0105   04/19/21 1745  piperacillin-tazobactam (ZOSYN) IVPB 3.375 g        3.375 g 100 mL/hr over 30 Minutes Intravenous  Once 04/19/21 1649 04/19/21 1910      \   Emeline Gins Charla Criscione M.D on 04/25/2021 at 12:10 PM  To page go to [email or web address]   [facility] -  Office  [phone]  See all Orders from today for further details    Objective:   Vitals:   04/25/21 0259 04/25/21 0351 04/25/21 0744 04/25/21 1138  BP: 137/70 (!) 142/72 (!) 175/86 (!) 173/71  Pulse:  68 71 65  Resp:  '16 20 19  ' Temp:  99.3 F (37.4 C) 98.6 F (37 C) 98.8 F (37.1 C)  TempSrc:  Oral Oral Axillary  SpO2:  95% 96% 94%  Weight:      Height:        Wt Readings  from Last 3 Encounters:  04/16/21 81.6 kg  03/12/21 85.3 kg  09/09/20 86.8 kg     Intake/Output Summary (Last 24 hours) at 04/25/2021 1210 Last data filed at 04/25/2021 1048 Gross per 24 hour  Intake 2826.36 ml  Output 3265 ml  Net -438.64 ml     Physical Exam  Awake Alert, Oriented X 3, No new F.N deficits, Normal affect Symmetrical Chest wall movement, Good air movement bilaterally, CTAB RRR,No Gallops,Rubs or new Murmurs, No Parasternal Heave +ve B.Sounds, Abd Soft, nontender, surgical wound covered with honeycomb mesh. No Cyanosis, Clubbing or edema, No new Rash or bruise        Data Review:    CBC Recent Labs  Lab 04/19/21 0117 04/20/21 0126 04/22/21 0225 04/23/21 0125 04/24/21 0127 04/24/21 1224 04/25/21 0106  WBC 8.5   < > 7.3  6.0 6.8 7.1 8.1  HGB 12.7*   < > 10.0* 9.6* 9.4* 9.8* 9.1*  HCT 34.9*   < > 28.9* 28.2* 27.7* 28.3* 27.0*  PLT 101*   < > 111* 109* 132* 131* 126*  MCV 90.6   < > 95.7 96.6 95.2 95.0 96.1  MCH 33.0   < > 33.1 32.9 32.3 32.9 32.4  MCHC 36.4*   < > 34.6 34.0 33.9 34.6 33.7  RDW 12.6   < > 13.3 13.2 13.2 13.3 13.2  LYMPHSABS 1.8  --   --   --   --   --   --   MONOABS 0.8  --   --   --   --   --   --   EOSABS 0.0  --   --   --   --   --   --   BASOSABS 0.0  --   --   --   --   --   --    < > = values in this interval not displayed.    Electrolytes Recent Labs  Lab 04/19/21 0117 04/19/21 1710 04/19/21 1943 04/20/21 0126 04/21/21 0443 04/22/21 0225 04/23/21 0125 04/24/21 0127 04/25/21 0106  NA 136  --   --  136 138 138 138 137 137  K 3.0*  --   --  3.5 3.8 3.9 3.3* 3.6 3.7  CL 103  --   --  105 109 106 108 106 108  CO2 23  --   --  16* '22 26 25 24 24  ' GLUCOSE 88  --   --  148* 112* 90 160* 165* 215*  BUN 26*  --   --  '23 15 11 9 ' 7* 8  CREATININE 1.33*  --   --  1.42* 1.19 1.22 1.17 1.01 0.98  CALCIUM 7.8*  --   --  7.0* 6.8* 7.3* 7.2* 7.3* 7.2*  AST 24  --   --  '28 28 31 26 29 ' 36  ALT 18  --   --  '22 22 23 19 23 30   ' ALKPHOS 60  --   --  51 48 80 67 83 77  BILITOT 1.2  --   --  1.0 0.7 0.4 0.4 0.4 0.4  ALBUMIN 2.4*  --   --  2.4* 1.9* 1.8* 1.7* 1.8* 1.8*  MG 2.1  --   --  1.7 2.0 1.8 1.4* 1.6* 1.8  CRP  --  18.7*  --  19.5*  --   --   --   --   --   DDIMER  --  6.43*  --  7.14*  --   --   --   --   --   PROCALCITON 0.86  --   --  0.57 1.38 0.92 0.66 0.43  --   LATICACIDVEN  --  1.3 1.2  --   --   --   --   --   --   HGBA1C >15.5*  --   --   --   --   --   --   --   --     ------------------------------------------------------------------------------------------------------------------ Recent Labs    04/25/21 0106  TRIG 67    Lab Results  Component Value Date   HGBA1C >15.5 (H) 04/19/2021    No results for input(s): TSH, T4TOTAL, T3FREE, THYROIDAB in the last 72 hours.  Invalid input(s): FREET3  ------------------------------------------------------------------------------------------------------------------ ID Labs Recent Labs  Lab 04/19/21 1710 04/19/21 1943  04/20/21 0126 04/21/21 0443 04/22/21 0225 04/23/21 0125 04/24/21 0127 04/24/21 1224 04/25/21 0106  WBC  --   --  4.1 6.4 7.3 6.0 6.8 7.1 8.1  PLT  --   --  96* 91* 111* 109* 132* 131* 126*  CRP 18.7*  --  19.5*  --   --   --   --   --   --   DDIMER 6.43*  --  7.14*  --   --   --   --   --   --   PROCALCITON  --   --  0.57 1.38 0.92 0.66 0.43  --   --   LATICACIDVEN 1.3 1.2  --   --   --   --   --   --   --   CREATININE  --   --  1.42* 1.19 1.22 1.17 1.01  --  0.98   Cardiac Enzymes No results for input(s): CKMB, TROPONINI, MYOGLOBIN in the last 168 hours.  Invalid input(s): CK  Radiology Reports CT ABDOMEN PELVIS W CONTRAST  Result Date: 04/24/2021 CLINICAL DATA:  Abdominal pain. EXAM: CT ABDOMEN AND PELVIS WITH CONTRAST TECHNIQUE: Multidetector CT imaging of the abdomen and pelvis was performed using the standard protocol following bolus administration of intravenous contrast. RADIATION DOSE REDUCTION: This exam was  performed according to the departmental dose-optimization program which includes automated exposure control, adjustment of the mA and/or kV according to patient size and/or use of iterative reconstruction technique. CONTRAST:  119m OMNIPAQUE IOHEXOL 300 MG/ML  SOLN COMPARISON:  CTA abdomen and pelvis 04/19/2021 FINDINGS: Lower chest: Left lower lobe posterior basal infiltrates have partially resolved in the interval. Irregular ground-glass opacity is noted in the lateral base of the right upper lobe. This is above the plane of both prior studies. There are small left and trace right pleural effusions. Other lung bases are clear. There is mild cardiomegaly with heavy calcification in the coronary arteries. Hepatobiliary: No mass is seen. Gallbladder and bile ducts are unremarkable. Pancreas: No mass or ductal dilatation. Scattered punctate calcifications of chronic pancreatitis. No evidence of acute pancreatitis. Spleen: Normal. Adrenals/Urinary Tract: Mild nodular thickening left adrenal gland. Right adrenal is unremarkable. The renal cortex unremarkable aside from a 2.6 cm cyst in the posterior left kidney. There are renovascular calcifications about the right renal hilum but no evidence of urinary stones or obstruction. Unremarkable bladder. Stomach/Bowel: Interval laparotomy with overlying skin staples. Subincisional surgical drain noted in the midline. Interval removal of the proximal ascending colon with end to side ileocolic surgical anastomosis. The anastomotic segment is fairly decompressed, but there is a caliber change from decompressed to dilated caliber in the anterior left mid to lower abdomen off the midline, 20-23 cm proximal to the surgical ileocolic junction, where there is a sharply angulated small bowel loop on axial images 53-56 of series 3, and coronal reconstruction image 24 of series 7. Above this level there is small bowel dilatation up to 4 cm, with backup into the proximal small bowel and  gastric fluid distention. This is concerning for small bowel obstruction due to adhesive disease or postsurgical kinking. Some of the left mid abdominal small bowel segments posteriorly are thick-walled but this seems to have been present previously such as due to a chronic enteritis or enteropathy. No bowel pneumatosis or portal venous gas is evident. Large intestine is decompressed without wall thickening with uncomplicated scattered diverticula. Vascular/Lymphatic: There are heavy vascular calcifications. This includes at the SMA origin and mid to  distal vessel past the inflection point with stenting of the right renal artery again shown and moderate aortoiliac mixed plaques. There is no AAA. Reproductive: No prostatomegaly. Other: There is mild body wall anasarca, mesenteric edema which could be postsurgical such as due to fluid overload or due to peritonitis. There is mild abdominal and pelvic free ascites. There is trace free intraperitoneal air in the mid abdomen underlying the incision. No abscess is seen. Mild scattered fluid in the mesenteric folds is also noted. No incarcerated hernia. Musculoskeletal: There are degenerative changes of the spine. No suspicious bone lesions IMPRESSION: 1. Interval removal of the cecum and presumably the terminal ileum which previously showed inflammatory change and wall thickening. Small part of the proximal ascending colon was also removed. 2. 20-23 cm proximal to the surgical ileocolic junction in the anterior mid/lower abdomen off midline there is a sharply angulated small bowel segment, above which the small bowel is diffusely dilated up to 4 cm with backed up fluid distending the stomach. This is concerning for small bowel obstruction due to postsurgical adhesions or postsurgical kinking. 3. Some of the more proximal small bowel segments show fold thickening but this does appear to have been present previously. Superimposed enteritis or ischemic wall edema is not  strictly excluded. No pneumatosis or portal venous gas. 4. Mild abdominal and pelvic free fluid. Only trace free air, probably related to the surgery. 5. Heavy vascular calcifications, including the SMA. 6. Patchy mesenteric edema and body wall anasarca, both probably due to fluid overload and/or postsurgical mesenteric edema. Underlying peritonitis not strictly excluded. 7. Left lower lobe posterior basal infiltrates partially cleared. Small left and trace right pleural effusions. 8. Small irregular ground-glass opacity, lateral base of the right upper lobe, could be atelectasis or pneumonitis. Follow-up recommended. Electronically Signed   By: Telford Nab M.D.   On: 04/24/2021 04:08   DG Abd Portable 1V  Result Date: 04/25/2021 CLINICAL DATA:  Abdominal distension.  Post GI surgery. EXAM: PORTABLE ABDOMEN - 1 VIEW COMPARISON:  01/11/2016.  CT, 04/24/2021. FINDINGS: Multiple loops of distended small bowel are noted throughout the central abdomen with minimal air noted in the colon. Skin staples extend along the low anterior abdominal midline. Bowel anastomosis staples are noted in the right lower quadrant. IMPRESSION: 1. Multiple loops of dilated small bowel with minimal colonic air. Findings are consistent with a partial small bowel obstruction, moderate to high-grade. Findings are also similar to the previous day's CT scan. Electronically Signed   By: Lajean Manes M.D.   On: 04/25/2021 10:50   [place] EKG SITE RITE  Result Date: 04/24/2021 If Site Rite image not attached, placement could not be confirmed due to current cardiac rhythm.    Patient ID: HEAVEN WANDELL, male   DOB: [birth year], 66 y.o.   MRN: [id number] Patient ID: AHMAAD NEIDHARDT, male   DOB: May 04, 1955, 66 y.o.   MRN: [id number] Patient ID: SANTI TROUNG, male   DOB: April 04, 1955, 66 y.o.   MRN: [id number] Patient ID: HISASHI AMADON, male   DOB: 1955/07/01, 66 y.o.   MRN: [id number]

## 2021-04-25 NOTE — Progress Notes (Signed)
PHARMACY - TOTAL PARENTERAL NUTRITION CONSULT NOTE   Indication: Small bowel obstruction  Patient Measurements: Height: 6' (182.9 cm) Weight: 81.6 kg (180 lb) IBW/kg (Calculated) : 77.6 TPN AdjBW (KG): 81.6 Body mass index is 24.41 kg/m. Usual Weight:    Assessment: Admitted with new-onset DM and DKA. Then had acute onset of abdominal pain and was found to have new ischemic bowel; underwent laparotomy with ileocecal resection  on 04/19/21. - New Bloody BM noted 1/27 overnight + abdominal pain with SBO on CT. Surgery feels pt may have ileus on exam 1/27.   Glucose / Insulin: DM on SSI, Semglee 8 units/d, CBGs 201-216 since starting TPN, (9 units/24h) . DKA improved. Electrolytes: K 3.7, Mg 1.8, Phos 2.2 low,  Ca 7.2 adjusts to 8.96 (goal K>=4, Mg>=2 with ileus) Renal: BUN 8, Scr <1, UOP 2053mL Hepatic: Albumin 1.8, other LFT's WNL (on sch'd APAP 650mg /6h, Lipitor80,) Intake / Output; MIVF: LR + 86meq K+ at 50ml/hr,  LBM 1/28, bloody. UOP 2055mL  GI Imaging: - 1/22: CT: Wall thickening and adjacent inflammatory fat stranding of the cecal base and terminal ileum, involving approximately 20 cm of the terminal ileum, consistent with nonspecific infectious or inflammatory terminal ileitis, - 1/27: Abd CT: concerning for SBO  GI Surgeries / Procedures:  - 1/22: acute onset of abdominal pain and was found to have new ischemic bowel he underwent laparotomy with ileocecal resection  Central access: PICC 1/27 TPN start date:  04/24/21  Nutritional Goals: Goal TPN rate is 82 mL/hr (provides 108 g of protein and 2129 kcals per day)  RD Assessment: Estimated Needs Total Energy Estimated Needs: 2100-2300 Total Protein Estimated Needs: 105-120 grams Total Fluid Estimated Needs: >/= 2.1 L  Current Nutrition:  Glucerna TID (3x given 1/26) TPN starting 1/27  Plan:  Increase TPN to 435mL/hr at 1800 will provide 52g protein and 1038 kcal. Electrolytes in TPN: Na 43mEq/L, K 44mEq/L (extra  67meq) , Ca 27mEq/L, Mg 37mEq/L, and Phos 32mmol/L. Cl:Ac 1:1 standard. Add standard MVI and trace elements to TPN Change to  Moderate q6h SSI and adjust as needed  Increase Semglee to 12 units daily Add 15 units of insulin to TPN Reduce MIVF to kvo tonight Monitor TPN labs on Mon/Thurs, and PRN Magnesium Sulfate 4g IV x 1 again today KPhos 21mmol IV x 1 (provides 29.48meq K+ also)   [name], PharmD, BCPS Clinical Staff Pharmacist [email or web address] Alford [name], [employer] 04/25/2021,7:48 AM

## 2021-04-25 NOTE — Progress Notes (Addendum)
6 Days Post-Op  Subjective: CC: Patient reports right sided pain that is similar to slightly worse than yesterday. No n/v. Does not feel distended. Passing some flatus. No BM. Afebrile. WBC wnl. No tachycardia or hypotension.   Objective: Vital signs in last 24 hours: Temp:  [97.6 F (36.4 C)-99.3 F (37.4 C)] 98.6 F (37 C) (01/28 0744) Pulse Rate:  [60-74] 71 (01/28 0744) Resp:  [13-20] 20 (01/28 0744) BP: (137-175)/(70-86) 175/86 (01/28 0744) SpO2:  [94 %-97 %] 96 % (01/28 0744) Last BM Date: 04/25/21  Intake/Output from previous day: 01/27 0701 - 01/28 0700 In: 2826.4 [I.V.:2726.4; IV Piggyback:100] Out: 2040 [Urine:2040] Intake/Output this shift: Total I/O In: -  Out: 600 [Urine:600]  PE: Gen:  Alert, NAD, pleasant Abd: Obese and soft, some distension, some right sided tenderness but no peritonitis. Appropriately tender around midline wound. Midline wound with staples in place and no signs of infection. Penrose drain out.  Psych: A&Ox3  Skin: no rashes noted, warm and dry  Lab Results:  Recent Labs    04/24/21 1224 04/25/21 0106  WBC 7.1 8.1  HGB 9.8* 9.1*  HCT 28.3* 27.0*  PLT 131* 126*   BMET Recent Labs    04/24/21 0127 04/25/21 0106  NA 137 137  K 3.6 3.7  CL 106 108  CO2 24 24  GLUCOSE 165* 215*  BUN 7* 8  CREATININE 1.01 0.98  CALCIUM 7.3* 7.2*   PT/INR No results for input(s): LABPROT, INR in the last 72 hours. CMP     Component Value Date/Time   NA 137 04/25/2021 0106   NA 141 07/11/2019 0812   K 3.7 04/25/2021 0106   CL 108 04/25/2021 0106   CO2 24 04/25/2021 0106   GLUCOSE 215 (H) 04/25/2021 0106   BUN 8 04/25/2021 0106   BUN 15 07/11/2019 0812   CREATININE 0.98 04/25/2021 0106   CREATININE 1.25 10/09/2020 1556   CALCIUM 7.2 (L) 04/25/2021 0106   PROT 4.3 (L) 04/25/2021 0106   PROT 6.7 07/11/2019 0811   ALBUMIN 1.8 (L) 04/25/2021 0106   ALBUMIN 4.1 07/11/2019 0811   AST 36 04/25/2021 0106   ALT 30 04/25/2021 0106    ALKPHOS 77 04/25/2021 0106   BILITOT 0.4 04/25/2021 0106   BILITOT 0.3 07/11/2019 0811   GFRNONAA >60 04/25/2021 0106   GFRAA 87 07/11/2019 0812   Lipase     Component Value Date/Time   LIPASE 85 (H) 04/19/2021 0117    Studies/Results: CT ABDOMEN PELVIS W CONTRAST  Result Date: 04/24/2021 CLINICAL DATA:  Abdominal pain. EXAM: CT ABDOMEN AND PELVIS WITH CONTRAST TECHNIQUE: Multidetector CT imaging of the abdomen and pelvis was performed using the standard protocol following bolus administration of intravenous contrast. RADIATION DOSE REDUCTION: This exam was performed according to the departmental dose-optimization program which includes automated exposure control, adjustment of the mA and/or kV according to patient size and/or use of iterative reconstruction technique. CONTRAST:  122mL OMNIPAQUE IOHEXOL 300 MG/ML  SOLN COMPARISON:  CTA abdomen and pelvis 04/19/2021 FINDINGS: Lower chest: Left lower lobe posterior basal infiltrates have partially resolved in the interval. Irregular ground-glass opacity is noted in the lateral base of the right upper lobe. This is above the plane of both prior studies. There are small left and trace right pleural effusions. Other lung bases are clear. There is mild cardiomegaly with heavy calcification in the coronary arteries. Hepatobiliary: No mass is seen. Gallbladder and bile ducts are unremarkable. Pancreas: No mass or ductal dilatation. Scattered punctate  calcifications of chronic pancreatitis. No evidence of acute pancreatitis. Spleen: Normal. Adrenals/Urinary Tract: Mild nodular thickening left adrenal gland. Right adrenal is unremarkable. The renal cortex unremarkable aside from a 2.6 cm cyst in the posterior left kidney. There are renovascular calcifications about the right renal hilum but no evidence of urinary stones or obstruction. Unremarkable bladder. Stomach/Bowel: Interval laparotomy with overlying skin staples. Subincisional surgical drain noted in the  midline. Interval removal of the proximal ascending colon with end to side ileocolic surgical anastomosis. The anastomotic segment is fairly decompressed, but there is a caliber change from decompressed to dilated caliber in the anterior left mid to lower abdomen off the midline, 20-23 cm proximal to the surgical ileocolic junction, where there is a sharply angulated small bowel loop on axial images 53-56 of series 3, and coronal reconstruction image 24 of series 7. Above this level there is small bowel dilatation up to 4 cm, with backup into the proximal small bowel and gastric fluid distention. This is concerning for small bowel obstruction due to adhesive disease or postsurgical kinking. Some of the left mid abdominal small bowel segments posteriorly are thick-walled but this seems to have been present previously such as due to a chronic enteritis or enteropathy. No bowel pneumatosis or portal venous gas is evident. Large intestine is decompressed without wall thickening with uncomplicated scattered diverticula. Vascular/Lymphatic: There are heavy vascular calcifications. This includes at the SMA origin and mid to distal vessel past the inflection point with stenting of the right renal artery again shown and moderate aortoiliac mixed plaques. There is no AAA. Reproductive: No prostatomegaly. Other: There is mild body wall anasarca, mesenteric edema which could be postsurgical such as due to fluid overload or due to peritonitis. There is mild abdominal and pelvic free ascites. There is trace free intraperitoneal air in the mid abdomen underlying the incision. No abscess is seen. Mild scattered fluid in the mesenteric folds is also noted. No incarcerated hernia. Musculoskeletal: There are degenerative changes of the spine. No suspicious bone lesions IMPRESSION: 1. Interval removal of the cecum and presumably the terminal ileum which previously showed inflammatory change and wall thickening. Small part of the  proximal ascending colon was also removed. 2. 20-23 cm proximal to the surgical ileocolic junction in the anterior mid/lower abdomen off midline there is a sharply angulated small bowel segment, above which the small bowel is diffusely dilated up to 4 cm with backed up fluid distending the stomach. This is concerning for small bowel obstruction due to postsurgical adhesions or postsurgical kinking. 3. Some of the more proximal small bowel segments show fold thickening but this does appear to have been present previously. Superimposed enteritis or ischemic wall edema is not strictly excluded. No pneumatosis or portal venous gas. 4. Mild abdominal and pelvic free fluid. Only trace free air, probably related to the surgery. 5. Heavy vascular calcifications, including the SMA. 6. Patchy mesenteric edema and body wall anasarca, both probably due to fluid overload and/or postsurgical mesenteric edema. Underlying peritonitis not strictly excluded. 7. Left lower lobe posterior basal infiltrates partially cleared. Small left and trace right pleural effusions. 8. Small irregular ground-glass opacity, lateral base of the right upper lobe, could be atelectasis or pneumonitis. Follow-up recommended. Electronically Signed   By: Telford Nab M.D.   On: 04/24/2021 04:08   [place] EKG SITE RITE  Result Date: 04/24/2021 If Site Rite image not attached, placement could not be confirmed due to current cardiac rhythm.   Anti-infectives: Anti-infectives (From admission, onward)  Start     Dose/Rate Route Frequency Ordered Stop   04/20/21 0200  piperacillin-tazobactam (ZOSYN) IVPB 3.375 g  Status:  Discontinued        3.375 g 12.5 mL/hr over 240 Minutes Intravenous Every 8 hours 04/19/21 1649 04/20/21 0105   04/20/21 0200  piperacillin-tazobactam (ZOSYN) IVPB 3.375 g        3.375 g 12.5 mL/hr over 240 Minutes Intravenous Every 8 hours 04/20/21 0105 04/24/21 0314   04/20/21 0000  piperacillin-tazobactam (ZOSYN) IVPB 3.375 g   Status:  Discontinued        3.375 g 100 mL/hr over 30 Minutes Intravenous Every 6 hours 04/19/21 2334 04/20/21 0105   04/19/21 1745  piperacillin-tazobactam (ZOSYN) IVPB 3.375 g        3.375 g 100 mL/hr over 30 Minutes Intravenous  Once 04/19/21 1649 04/19/21 1910        Assessment/Plan POD 6 s/p dx lap converted to ex lap with ileocecectomy for 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 Thermon Leyland 1/22 - Agree with note from yesterday. Continue current course for now.  - Keep NPO. If he has any n/v will place ng tube but for now will go without - TPN  - Recheck xray in am. Hopefully ct finding will resolve on own. If this does not there is small chance might need to go back - would like to continue heparin due to presumed ischemia as etiology, follow hct, I think bm from the other day was more old blood. No further bloody bm's  FEN: NPO, TPN ID: zosyn 1/22 - 1/26 VTE: hep gtt 1/23 >> platelets 126   Per primary: T2DM with DKA AKI Afib on eliquis baseline HTN  This care required moderate level of medical decision making.    LOS: 9 days    Jillyn Ledger , [facility] 04/25/2021, 10:33 AM Please see Amion for pager number during day hours 7:00am-4:30pm

## 2021-04-25 NOTE — Plan of Care (Signed)

## 2021-04-25 NOTE — Progress Notes (Signed)
ANTICOAGULATION CONSULT NOTE -follow up  Pharmacy Consult for heparin Indication: Afib  Allergies  Allergen Reactions   Vicodin [Hydrocodone-Acetaminophen] Nausea And Vomiting    Patient Measurements: Height: 6' (182.9 cm) Weight: 81.6 kg (180 lb) IBW/kg (Calculated) : 77.6 Heparin dosing weight:  =TBW 81.6 kg   Vital Signs: Temp: 99.3 F (37.4 C) (01/28 0351) Temp Source: Oral (01/28 0351) BP: 142/72 (01/28 0351) Pulse Rate: 68 (01/28 0351)  Labs: Recent Labs    04/23/21 0125 04/24/21 0127 04/24/21 1224 04/24/21 2024 04/25/21 0106  HGB 9.6* 9.4* 9.8*  --  9.1*  HCT 28.2* 27.7* 28.3*  --  27.0*  PLT 109* 132* 131*  --  126*  HEPARINUNFRC 0.46 0.33  --  0.27* 0.43  CREATININE 1.17 1.01  --   --  0.98     Estimated Creatinine Clearance: 81.4 mL/min (by C-G formula based on SCr of 0.98 mg/dL).   Medical History: Past Medical History:  Diagnosis Date   AKI (acute kidney injury) ([place]) 12/2015   Anxiety    Arthritis    Carotid artery stenosis    Coronary artery disease    Dementia ([place])    problems with short term memory   Depression    DKA (diabetic ketoacidosis) ([place]) 04/16/2021   Dysrhythmia    episode of afib 12/2005 in setting of cocaine use   Gout    Hyperlipidemia    Hypertension    Myocardial infarction [facility])    2017   PAD (peripheral artery disease) ([facility])    Stroke ([facility])    TIA   TIA (transient ischemic attack)    over 20 years ago    Assessment: 66yo male had Eliquis for h/o Afib reversed for urgent ileocecectomy/ex-lap. Plan to bridge with IV heparin for 2-3 days before resuming apixaban.   Heparin held 1/27 due to bloody bowel movement. Repeat CBC came back with a hgb of 9.8. Heparin resumed afternoon of 1/27 with modified goal and no bolus per Dr. Waldron Labs.   This morning's HL is 0.43 on 1350 units/hour. Hgb 9.1, PLTs 126. No signs of bleeding or issues with infusion noted.   Goal of Therapy:  Heparin level: 0.3-0.5 Monitor platelets  by anticoagulation protocol: Yes   Plan:  Continue IV heparin 1350 units/hr Check HL and CBC daily Monitor for s/sx of bleeding. F/u resume oral AC  Donald Pore, PharmD Pharmacy Resident 04/25/2021, 7:40 AM  Please check AMION for all [place] phone numbers After 10:00 PM, call [place] [phone]

## 2021-04-26 ENCOUNTER — Inpatient Hospital Stay (HOSPITAL_COMMUNITY): Payer: Medicare Other

## 2021-04-26 DIAGNOSIS — K567 Ileus, unspecified: Secondary | ICD-10-CM

## 2021-04-26 DIAGNOSIS — K9189 Other postprocedural complications and disorders of digestive system: Secondary | ICD-10-CM

## 2021-04-26 LAB — CBC
HCT: 25.6 % — ABNORMAL LOW (ref 39.0–52.0)
Hemoglobin: 8.5 g/dL — ABNORMAL LOW (ref 13.0–17.0)
MCH: 32.6 pg (ref 26.0–34.0)
MCHC: 33.2 g/dL (ref 30.0–36.0)
MCV: 98.1 fL (ref 80.0–100.0)
Platelets: 143 10*3/uL — ABNORMAL LOW (ref 150–400)
RBC: 2.61 MIL/uL — ABNORMAL LOW (ref 4.22–5.81)
RDW: 13.2 % (ref 11.5–15.5)
WBC: 8.9 10*3/uL (ref 4.0–10.5)
nRBC: 0.8 % — ABNORMAL HIGH (ref 0.0–0.2)

## 2021-04-26 LAB — BASIC METABOLIC PANEL
Anion gap: 7 (ref 5–15)
BUN: 8 mg/dL (ref 8–23)
CO2: 24 mmol/L (ref 22–32)
Calcium: 7.3 mg/dL — ABNORMAL LOW (ref 8.9–10.3)
Chloride: 105 mmol/L (ref 98–111)
Creatinine, Ser: 0.92 mg/dL (ref 0.61–1.24)
GFR, Estimated: >60 mL/min (ref >60)
Glucose, Bld: 208 mg/dL — ABNORMAL HIGH (ref 70–99)
Potassium: 3.4 mmol/L — ABNORMAL LOW (ref 3.5–5.1)
Sodium: 136 mmol/L (ref 135–145)

## 2021-04-26 LAB — GLUCOSE, CAPILLARY
Glucose-Capillary: 125 mg/dL — ABNORMAL HIGH (ref 70–99)
Glucose-Capillary: 161 mg/dL — ABNORMAL HIGH (ref 70–99)
Glucose-Capillary: 177 mg/dL — ABNORMAL HIGH (ref 70–99)
Glucose-Capillary: 194 mg/dL — ABNORMAL HIGH (ref 70–99)
Glucose-Capillary: 216 mg/dL — ABNORMAL HIGH (ref 70–99)
Glucose-Capillary: 265 mg/dL — ABNORMAL HIGH (ref 70–99)
Glucose-Capillary: 273 mg/dL — ABNORMAL HIGH (ref 70–99)

## 2021-04-26 LAB — HEPARIN LEVEL (UNFRACTIONATED)
Heparin Unfractionated: 0.52 IU/mL (ref 0.30–0.70)
Heparin Unfractionated: 0.35 IU/mL (ref 0.30–0.70)

## 2021-04-26 LAB — PHOSPHORUS: Phosphorus: 2.7 mg/dL (ref 2.5–4.6)

## 2021-04-26 LAB — MAGNESIUM: Magnesium: 1.7 mg/dL (ref 1.7–2.4)

## 2021-04-26 MED ORDER — HYDROMORPHONE HCL 1 MG/ML IJ SOLN
1.0000 mg | Freq: Once | INTRAMUSCULAR | Status: AC
  Administered 2021-04-26: 1 mg via INTRAVENOUS
  Filled 2021-04-26: qty 1

## 2021-04-26 MED ORDER — TRAVASOL 10 % IV SOLN
INTRAVENOUS | Status: AC
  Filled 2021-04-26: qty 792

## 2021-04-26 MED ORDER — POTASSIUM CHLORIDE 10 MEQ/50ML IV SOLN
10.0000 meq | INTRAVENOUS | Status: AC
  Administered 2021-04-26 (×4): 10 meq via INTRAVENOUS
  Filled 2021-04-26 (×4): qty 50

## 2021-04-26 MED ORDER — POTASSIUM CHLORIDE CRYS ER 20 MEQ PO TBCR
20.0000 meq | EXTENDED_RELEASE_TABLET | Freq: Once | ORAL | Status: AC
  Administered 2021-04-26: 20 meq via ORAL
  Filled 2021-04-26: qty 1

## 2021-04-26 MED ORDER — MAGNESIUM SULFATE 4 GM/100ML IV SOLN
4.0000 g | Freq: Once | INTRAVENOUS | Status: AC
  Administered 2021-04-26: 4 g via INTRAVENOUS
  Filled 2021-04-26: qty 100

## 2021-04-26 MED ORDER — TRAVASOL 10 % IV SOLN
INTRAVENOUS | Status: DC
  Filled 2021-04-26 (×2): qty 792

## 2021-04-26 MED ORDER — INSULIN GLARGINE-YFGN 100 UNIT/ML ~~LOC~~ SOLN
22.0000 [IU] | Freq: Every day | SUBCUTANEOUS | Status: DC
  Administered 2021-04-26 – 2021-05-03 (×8): 22 [IU] via SUBCUTANEOUS
  Filled 2021-04-26 (×8): qty 0.22

## 2021-04-26 MED ORDER — IOHEXOL 300 MG/ML  SOLN
100.0000 mL | Freq: Once | INTRAMUSCULAR | Status: AC | PRN
  Administered 2021-04-26: 100 mL via INTRAVENOUS

## 2021-04-26 NOTE — Progress Notes (Signed)
PHARMACY - TOTAL PARENTERAL NUTRITION CONSULT NOTE   Indication: Small bowel obstruction  Patient Measurements: Height: 6' (182.9 cm) Weight: 81.6 kg (180 lb) IBW/kg (Calculated) : 77.6 TPN AdjBW (KG): 81.6 Body mass index is 24.41 kg/m. Usual Weight:    Assessment: Admitted with new-onset DM and DKA. Then had acute onset of abdominal pain and was found to have new ischemic bowel; underwent laparotomy with ileocecal resection  on 04/19/21. - New Bloody BM noted 1/27 overnight + abdominal pain with SBO on CT. Surgery feels pt may have ileus on exam 1/27.   Glucose / Insulin: DM on mod SSI, Semglee 12 units/d, CBGs 198-303 last 24h, (25 units/24h) . DKA improved. Electrolytes: K 3.4, Mg 1.7, Phos 2.7,  Ca 7.2 adjusts to 8.96 (goal K>=4, Mg>=2 with ileus). May be losing lytes through extensive UOP. Renal: BUN 7, Scr <1, UOP 3064mL Hepatic: Albumin 1.8, other LFT's WNL (on sch'd APAP 650mg /6h, Lipitor80,) Intake / Output; MIVF: LR at kvo LBM 1/28, bloody. No NG tube for now. UOP 3037mL  GI Imaging: - 1/22: CT: Wall thickening and adjacent inflammatory fat stranding of the cecal base and terminal ileum, involving approximately 20 cm of the terminal ileum, consistent with nonspecific infectious or inflammatory terminal ileitis, - 1/27: Abd CT: concerning for SBO  GI Surgeries / Procedures:  - 1/22: acute onset of abdominal pain and was found to have new ischemic bowel he underwent laparotomy with ileocecal resection  Central access: PICC 1/27 TPN start date:  04/24/21  Nutritional Goals: Goal TPN rate is 82 mL/hr (provides 108 g of protein and 2129 kcals per day)  RD Assessment: Estimated Needs Total Energy Estimated Needs: 2100-2300 Total Protein Estimated Needs: 105-120 grams Total Fluid Estimated Needs: >/= 2.1 L  Current Nutrition:  Glucerna TID (3x given 1/26) TPN starting 1/27  Plan:  Con't TPN to 52mL/hr (do not increase with extensive hyperglycemia) at 1800 will  provide 52g protein and 1038 kcal. Electrolytes in TPN: Na 11mEq/L, K 35mEq/L (extra 61meq) , Ca 25mEq/L, Mg33mEq/L, and Phos 31mmol/L. Cl:Ac 1:1 standard. Add standard MVI and trace elements to TPN Change to  Moderate q6h SSI and adjust as needed  Increase Semglee to 22 units daily Add 25 units of insulin to TPN MIVF to kvo  Monitor TPN labs on Mon/Thurs, and PRN Magnesium Sulfate 4g IV x 1 again today K runs 57meq x 4 today (extra 41meq in new TPN bag tonight)   [name], PharmD, BCPS Clinical Staff Pharmacist [email or web address] [place] [name], [employer] 04/26/2021,7:10 AM

## 2021-04-26 NOTE — Progress Notes (Signed)
7 Days Post-Op  Subjective: CC: States he is feeling better this morning. The right sided abdominal pain that he was having yesterday has now resolved and his abdomen is less tight. Still having soreness around his incision. No n/v. Passed flatus several times this am and had a soft brown bm. Denies any bloody bm's. Afebrile. WBC remains wnl.   Objective: Vital signs in last 24 hours: Temp:  [98 F (36.7 C)-98.8 F (37.1 C)] 98.1 F (36.7 C) (01/29 0741) Pulse Rate:  [56-67] 63 (01/29 0853) Resp:  [12-20] 19 (01/29 0741) BP: (113-173)/(59-87) 165/77 (01/29 0853) SpO2:  [94 %-98 %] 96 % (01/29 0741) Last BM Date: 04/25/21  Intake/Output from previous day: 01/28 0701 - 01/29 0700 In: 2987 [I.V.:2380.8; IV Piggyback:606.3] Out: 3075 [Urine:3075] Intake/Output this shift: No intake/output data recorded.  PE: Gen:  Alert, NAD, pleasant Abd: Obese. Abdomen is much softer than yesterday. Some distension that appears improved from yesterday. Appropriately tender around midline wound. Otherwise NT. Prior area of tenderness yesterday seems to have resolved. Midline wound with staples in place and no signs of infection. Penrose drain out.  Psych: A&Ox3  Skin: no rashes noted, warm and dry    Lab Results:  Recent Labs    04/25/21 0106 04/26/21 0253  WBC 8.1 8.9  HGB 9.1* 8.5*  HCT 27.0* 25.6*  PLT 126* 143*   BMET Recent Labs    04/25/21 0106 04/26/21 0253  NA 137 136  K 3.7 3.4*  CL 108 105  CO2 24 24  GLUCOSE 215* 208*  BUN 8 8  CREATININE 0.98 0.92  CALCIUM 7.2* 7.3*   PT/INR No results for input(s): LABPROT, INR in the last 72 hours. CMP     Component Value Date/Time   NA 136 04/26/2021 0253   NA 141 07/11/2019 0812   K 3.4 (L) 04/26/2021 0253   CL 105 04/26/2021 0253   CO2 24 04/26/2021 0253   GLUCOSE 208 (H) 04/26/2021 0253   BUN 8 04/26/2021 0253   BUN 15 07/11/2019 0812   CREATININE 0.92 04/26/2021 0253   CREATININE 1.25 10/09/2020 1556    CALCIUM 7.3 (L) 04/26/2021 0253   PROT 4.3 (L) 04/25/2021 0106   PROT 6.7 07/11/2019 0811   ALBUMIN 1.8 (L) 04/25/2021 0106   ALBUMIN 4.1 07/11/2019 0811   AST 36 04/25/2021 0106   ALT 30 04/25/2021 0106   ALKPHOS 77 04/25/2021 0106   BILITOT 0.4 04/25/2021 0106   BILITOT 0.3 07/11/2019 0811   GFRNONAA >60 04/26/2021 0253   GFRAA 87 07/11/2019 0812   Lipase     Component Value Date/Time   LIPASE 85 (H) 04/19/2021 0117    Studies/Results: DG Abd Portable 1V  Result Date: 04/25/2021 CLINICAL DATA:  Abdominal distension.  Post GI surgery. EXAM: PORTABLE ABDOMEN - 1 VIEW COMPARISON:  01/11/2016.  CT, 04/24/2021. FINDINGS: Multiple loops of distended small bowel are noted throughout the central abdomen with minimal air noted in the colon. Skin staples extend along the low anterior abdominal midline. Bowel anastomosis staples are noted in the right lower quadrant. IMPRESSION: 1. Multiple loops of dilated small bowel with minimal colonic air. Findings are consistent with a partial small bowel obstruction, moderate to high-grade. Findings are also similar to the previous day's CT scan. Electronically Signed   By: Lajean Manes M.D.   On: 04/25/2021 10:50   [place] EKG SITE RITE  Result Date: 04/24/2021 If Site Rite image not attached, placement could not be confirmed due to  current cardiac rhythm.   Anti-infectives: Anti-infectives (From admission, onward)    Start     Dose/Rate Route Frequency Ordered Stop   04/20/21 0200  piperacillin-tazobactam (ZOSYN) IVPB 3.375 g  Status:  Discontinued        3.375 g 12.5 mL/hr over 240 Minutes Intravenous Every 8 hours 04/19/21 1649 04/20/21 0105   04/20/21 0200  piperacillin-tazobactam (ZOSYN) IVPB 3.375 g        3.375 g 12.5 mL/hr over 240 Minutes Intravenous Every 8 hours 04/20/21 0105 04/24/21 0314   04/20/21 0000  piperacillin-tazobactam (ZOSYN) IVPB 3.375 g  Status:  Discontinued        3.375 g 100 mL/hr over 30 Minutes Intravenous Every 6  hours 04/19/21 2334 04/20/21 0105   04/19/21 1745  piperacillin-tazobactam (ZOSYN) IVPB 3.375 g        3.375 g 100 mL/hr over 30 Minutes Intravenous  Once 04/19/21 1649 04/19/21 1910        Assessment/Plan POD 7 s/p dx lap converted to ex lap with ileocecectomy for 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 Thermon Leyland 1/22 - Patient with robf and no further bloody bm's. Symptomatically improved and exam has improved as well. VSS and reassuring without tachycardia or hypotension. He remains afebrile with wbc wnl.  - Would like to continue heparin due to presumed ischemia as etiology, follow H/H, I think bm from the other day was more old blood. Again no further bloody bm's - Cont TPN  - Will discuss with MD about starting back on CLD today   FEN: NPO, TPN ID: zosyn 1/22 - 1/26 VTE: hep gtt 1/23 >> platelets 143   Per primary: T2DM with DKA AKI Afib on eliquis baseline HTN  This care required moderate level of medical decision making.    LOS: 10 days    Jillyn Ledger , [facility] 04/26/2021, 9:34 AM Please see Amion for pager number during day hours 7:00am-4:30pm

## 2021-04-26 NOTE — Progress Notes (Signed)
ANTICOAGULATION CONSULT NOTE -follow up  Pharmacy Consult for heparin Indication: Afib  Allergies  Allergen Reactions   Vicodin [Hydrocodone-Acetaminophen] Nausea And Vomiting    Patient Measurements: Height: 6' (182.9 cm) Weight: 81.6 kg (180 lb) IBW/kg (Calculated) : 77.6 Heparin dosing weight:  =TBW 81.6 kg   Vital Signs: Temp: 97.9 F (36.6 C) (01/29 1145) Temp Source: Oral (01/29 1145) BP: 141/71 (01/29 1145) Pulse Rate: 56 (01/29 1145)  Labs: Recent Labs    04/24/21 0127 04/24/21 1224 04/24/21 2024 04/25/21 0106 04/26/21 0253 04/26/21 1500  HGB 9.4* 9.8*  --  9.1* 8.5*  --   HCT 27.7* 28.3*  --  27.0* 25.6*  --   PLT 132* 131*  --  126* 143*  --   HEPARINUNFRC 0.33  --    < > 0.43 0.52 0.35  CREATININE 1.01  --   --  0.98 0.92  --    < > = values in this interval not displayed.     Estimated Creatinine Clearance: 86.7 mL/min (by C-G formula based on SCr of 0.92 mg/dL).   Medical History: Past Medical History:  Diagnosis Date   AKI (acute kidney injury) ([place]) 12/2015   Anxiety    Arthritis    Carotid artery stenosis    Coronary artery disease    Dementia ([place])    problems with short term memory   Depression    DKA (diabetic ketoacidosis) ([place]) 04/16/2021   Dysrhythmia    episode of afib 12/2005 in setting of cocaine use   Gout    Hyperlipidemia    Hypertension    Myocardial infarction [facility])    2017   PAD (peripheral artery disease) ([facility])    Stroke ([facility])    TIA   TIA (transient ischemic attack)    over 20 years ago    Assessment: 66yo male had Eliquis for h/o Afib reversed for urgent ileocecectomy/ex-lap. Plan to bridge with IV heparin for 2-3 days before resuming apixaban.   Heparin held 1/27 due to bloody bowel movement. Repeat CBC came back with a hgb of 9.8. Heparin resumed afternoon of 1/27 with modified goal and no bolus per Dr. Waldron Labs.   Heparin level this afternoon is 0.35 which is in goal range on 1300 units/hour. Hgb 8.5, PLTs  143. No overt signs of bleeding or issues with infusion noted.   Goal of Therapy:  Heparin level: 0.3-0.5 Monitor platelets by anticoagulation protocol: Yes   Plan:  Continue IV heparin at 1300 units/hr Check heparin level and CBC daily Monitor for s/sx of bleeding. F/u resume oral AC  [place] Crane, Roylene Reason, [facility] Clinical Pharmacist  04/26/2021 3:54 PM   [facility] pharmacy phone numbers are listed on [email or web address]

## 2021-04-26 NOTE — Progress Notes (Signed)
PROGRESS NOTE                                                                                                                                                                                                             Patient Demographics:    [name], is a 66 y.o. male, DOB - May 13, 1955, VCB:[id number]  Outpatient Primary MD for the patient is [name], MD    LOS - 10  Admit date - 04/16/2021    Chief Complaint  Patient presents with   Hyperglycemia       Brief Narrative (HPI from H&P)     66 y.o. male with medical history significant of CAD, HTN.  He presented to the ER with about 5 to 7-day history of feeling weak with increase in urination and thirst along with some tremors.  In the ER he was diagnosed with new onset DM type II with DKA and admitted to the hospital.  He was treated for DKA and stabilized he was about to be discharged on 04/19/2021 but right before being discharged he developed acute onset of abdominal pain and was found to have new ischemic bowel he underwent laparotomy with ileocecal resection and now recovering from the surgery.   Subjective:   Patient reports abdominal pain is controlled on current regimen, no nausea, no vomiting, passing flatus, brown bowel movement yesterday with no blood.    Assessment  & Plan :    No notes have been filed under this hospital service. Service: Hospitalist   New diagnosis of DM type II with DKA on presentation - with severe dehydration - AKI, pseudohyponatremia and metabolic acidosis due to DKA. -He was treated with DKA protocol along with aggressive IV fluids and electrolyte replacement, gap is closed he has been transitioned to Lantus and sliding scale, dose adjusted on 04/20/2021 as he is n.p.o. postop.  Continue to monitor and adjust closely. -Currently controlled on 8 units of Lantus and insulin sliding scale.  He is on TPN, has been transitioned  to added insulin to TPN.  Continue with sliding scale every 6 hours.  CBG (last 3)  Recent Labs    04/26/21 0355 04/26/21 0743 04/26/21 1148  GLUCAP 194* 216* 273*   Lab Results  Component Value Date   HGBA1C >15.5 (H) 04/19/2021     Ischemic bowel /embolic mesenteric  ischemia - patient sudden onset of periumbilical abdominal pain on 04/19/2021.  Work-up suggestive of ischemic bowel, seen by vascular surgery and general surgery . -  s/p dx lap converted to ex lap with ileocecectomy for 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 Thermon Leyland 1/22 - Continue hydration, continue heparin drip.  He has plenty of risk factors which are PAD,CAD, Smoking, Afib - Heparin drip resumed on 04/20/2021 after discussions with general surgery.  On IV Zosyn. -Patient had bloody BM 1/26, likely old blood, H&H is stable, resumed back on TTE, especially he presents with embolic ischemic bowel.  Continue to monitor CBC closely. -Patient currently n.p.o., as evidence of postoperative SBO, so far no nausea and vomiting, so we will hold on NG tube, advancing diet per general surgery.   -Started on TPN on 1/27.   Paroxysmal atrial fibrillation.  Continue beta-blocker and heparin instead of Eliquis in the perioperative period.  Mali vas 2 score of greater than 3. -Continue to monitor closely while on heparin GTT.  Hypertension.  Blood pressure is better on beta-blocker monitor add as needed hydralazine..  Dyslipidemia on statin and Vascepa.  AKI on CKD 3A with baseline creatinine of 1.5, AKI due to severe dehydration - holding ARB and HCTZ, has been aggressively hydrated with IV fluids and AKI seems to have resolved.  Hypokalemia -potassium is low at 3.3 today, repleted.  Hypomagnesemia.  Aggressively replaced.  H/O gout.  On allopurinol.  Encouraged the patient to sit up in chair in the daytime use I-S and flutter valve for pulmonary toiletry.  Will advance  activity and titrate down oxygen as possible.         Condition - Fair  Family Communication  : None at bedside  Code Status :  Full  Consults  :  None  PUD Prophylaxis : PPI   Procedures  :     Exploratory laparotomy with Ileocecectomy - by [facility] 04/20/21   CT - 1. Mild circumferential wall thickening of the distal ileum with adjacent inflammation and small amount of free fluid. This is nonspecific but may represent an infectious or inflammatory enteritis. Given very heavy mesenteric arterial calcifications, ischemia is not entirely excluded but no evidence of pneumatosis intestinalis or pneumoperitoneum. Mildly distended loops of fluid-filled small bowel proximally without transition point, which may represent a mild ileus or low-grade partial obstruction at the distal ileum. 2. Mild bilateral LOWER lobe cylindrical bronchiectasis with minimal tree in bud opacities in the LEFT LOWER lobe, likely representing a mild infectious or inflammatory process. 3. Coronary artery disease and aortic Atherosclerosis (ICD10-I70.0).       Disposition Plan  :    Status is: Inpatient  Remains inpatient appropriate because: DKA  DVT Prophylaxis  :  on heparin GTT  Place TED hose Start: 04/18/21 1101     Lab Results  Component Value Date   PLT 143 (L) 04/26/2021    Diet :  Diet Order             Diet clear liquid Room service appropriate? Yes; Fluid consistency: Thin  Diet effective now                    Inpatient Medications  Scheduled Meds:  acetaminophen  650 mg Oral Q6H   allopurinol  100 mg Oral Daily   atorvastatin  80 mg Oral QHS   Chlorhexidine Gluconate Cloth  6 each Topical Daily   docusate sodium  100 mg  Oral BID   escitalopram  20 mg Oral Daily   icosapent Ethyl  2 g Oral BID   insulin aspart  0-15 Units Subcutaneous Q6H   insulin glargine-yfgn  22 Units Subcutaneous Daily   insulin starter kit- pen needles  1 kit Other Once   living well with diabetes book    Does not apply Once   metoprolol tartrate  25 mg Oral BID   pantoprazole  40 mg Oral Daily   sodium chloride flush  10-40 mL Intracatheter Q12H   traZODone  100 mg Oral QHS   Continuous Infusions:  heparin 1,300 Units/hr (04/26/21 0821)   lactated ringers 10 mL/hr at 04/25/21 1800   methocarbamol (ROBAXIN) IV     TPN ADULT (ION) 60 mL/hr at 04/25/21 1800   TPN ADULT (ION)      PRN Meds:.busPIRone, dextrose, hydrALAZINE, HYDROmorphone (DILAUDID) injection, hydrOXYzine, methocarbamol (ROBAXIN) IV, metoprolol tartrate, ondansetron (ZOFRAN) IV, oxyCODONE, prochlorperazine, simethicone, sodium chloride flush  Antibiotics  :    Anti-infectives (From admission, onward)    Start     Dose/Rate Route Frequency Ordered Stop   04/20/21 0200  piperacillin-tazobactam (ZOSYN) IVPB 3.375 g  Status:  Discontinued        3.375 g 12.5 mL/hr over 240 Minutes Intravenous Every 8 hours 04/19/21 1649 04/20/21 0105   04/20/21 0200  piperacillin-tazobactam (ZOSYN) IVPB 3.375 g        3.375 g 12.5 mL/hr over 240 Minutes Intravenous Every 8 hours 04/20/21 0105 04/24/21 0314   04/20/21 0000  piperacillin-tazobactam (ZOSYN) IVPB 3.375 g  Status:  Discontinued        3.375 g 100 mL/hr over 30 Minutes Intravenous Every 6 hours 04/19/21 2334 04/20/21 0105   04/19/21 1745  piperacillin-tazobactam (ZOSYN) IVPB 3.375 g        3.375 g 100 mL/hr over 30 Minutes Intravenous  Once 04/19/21 1649 04/19/21 1910      \   Emeline Gins Arielys Wandersee M.D on 04/26/2021 at 1:17 PM  To page go to [email or web address]   [facility] -  Office  [phone]  See all Orders from today for further details    Objective:   Vitals:   04/26/21 0320 04/26/21 0741 04/26/21 0853 04/26/21 1145  BP: (!) 113/59 (!) 153/71 (!) 165/77 (!) 141/71  Pulse: (!) 56 64 63 (!) 56  Resp: _0 Temp: 98.1 F (36.7 C) 98.1 F (36.7 C)  97.9 F (36.6 C)  TempSrc: Oral Oral  Oral  SpO2: 95% 96%  96%  Weight:      Height:        Wt  Readings from Last 3 Encounters:  04/16/21 81.6 kg  03/12/21 85.3 kg  09/09/20 86.8 kg     Intake/Output Summary (Last 24 hours) at 04/26/2021 1317 Last data filed at 04/26/2021 0300 Gross per 24 hour  Intake 1590.25 ml  Output 1650 ml  Net -59.75 ml     Physical Exam  Awake Alert, Oriented X 3, No new F.N deficits, Normal affect Symmetrical Chest wall movement, Good air movement bilaterally, CTAB RRR,No Gallops,Rubs or new Murmurs, No Parasternal Heave +ve B.Sounds, mild distention, improving, midline surgical incision with staples, no discharge or oozing., No rebound - guarding or rigidity. No Cyanosis, Clubbing or edema, No new Rash or bruise         Data Review:    CBC Recent Labs  Lab 04/23/21 0125 04/24/21 0127 04/24/21 1224 04/25/21 0106 04/26/21 0253  WBC 6.0 6.8 7.1 8.1  8.9  HGB 9.6* 9.4* 9.8* 9.1* 8.5*  HCT 28.2* 27.7* 28.3* 27.0* 25.6*  PLT 109* 132* 131* 126* 143*  MCV 96.6 95.2 95.0 96.1 98.1  MCH 32.9 32.3 32.9 32.4 32.6  MCHC 34.0 33.9 34.6 33.7 33.2  RDW 13.2 13.2 13.3 13.2 13.2    Electrolytes Recent Labs  Lab 04/19/21 1710 04/19/21 1943 04/20/21 0126 04/20/21 0126 04/21/21 0443 04/22/21 0225 04/23/21 0125 04/24/21 0127 04/25/21 0106 04/26/21 0253  NA  --   --  136   < > 138 138 138 137 137 136  K  --   --  3.5   < > 3.8 3.9 3.3* 3.6 3.7 3.4*  CL  --   --  105   < > 109 106 108 106 108 105  CO2  --   --  16*   < > _0 GLUCOSE  --   --  148*   < > 112* 90 160* 165* 215* 208*  BUN  --   --  23   < > _1 7* 8 8  CREATININE  --   --  1.42*   < > 1.19 1.22 1.17 1.01 0.98 0.92  CALCIUM  --   --  7.0*   < > 6.8* 7.3* 7.2* 7.3* 7.2* 7.3*  AST  --   --  28   < > _2 36  --   ALT  --   --  22   < > _3 --   ALKPHOS  --   --  51   < > 48 80 67 83 77  --   BILITOT  --   --  1.0   < > 0.7 0.4 0.4 0.4 0.4  --   ALBUMIN  --   --  2.4*   < > 1.9* 1.8* 1.7* 1.8* 1.8*  --   MG  --   --  1.7   < > 2.0 1.8  1.4* 1.6* 1.8 1.7  CRP 18.7*  --  19.5*  --   --   --   --   --   --   --   DDIMER 6.43*  --  7.14*  --   --   --   --   --   --   --   PROCALCITON  --   --  0.57  --  1.38 0.92 0.66 0.43  --   --   LATICACIDVEN 1.3 1.2  --   --   --   --   --   --   --   --    < > = values in this interval not displayed.    ------------------------------------------------------------------------------------------------------------------ Recent Labs    04/25/21 0106  TRIG 67    Lab Results  Component Value Date   HGBA1C >15.5 (H) 04/19/2021    No results for input(s): TSH, T4TOTAL, T3FREE, THYROIDAB in the last 72 hours.  Invalid input(s): FREET3  ------------------------------------------------------------------------------------------------------------------ ID Labs Recent Labs  Lab 04/19/21 1710 04/19/21 1943 04/20/21 0126 04/20/21 0126 04/21/21 0443 04/22/21 0225 04/23/21 0125 04/24/21 0127 04/24/21 1224 04/25/21 0106 04/26/21 0253  WBC  --   --  4.1   < > 6.4 7.3 6.0 6.8 7.1 8.1 8.9  PLT  --   --  96*   < > 91* 111* 109* 132* 131* 126* 143*  CRP 18.7*  --  19.5*  --   --   --   --   --   --   --   --  DDIMER 6.43*  --  7.14*  --   --   --   --   --   --   --   --   PROCALCITON  --   --  0.57  --  1.38 0.92 0.66 0.43  --   --   --   LATICACIDVEN 1.3 1.2  --   --   --   --   --   --   --   --   --   CREATININE  --   --  1.42*   < > 1.19 1.22 1.17 1.01  --  0.98 0.92   < > = values in this interval not displayed.   Cardiac Enzymes No results for input(s): CKMB, TROPONINI, MYOGLOBIN in the last 168 hours.  Invalid input(s): CK  Radiology Reports CT ABDOMEN PELVIS W CONTRAST  Result Date: 04/24/2021 CLINICAL DATA:  Abdominal pain. EXAM: CT ABDOMEN AND PELVIS WITH CONTRAST TECHNIQUE: Multidetector CT imaging of the abdomen and pelvis was performed using the standard protocol following bolus administration of intravenous contrast. RADIATION DOSE REDUCTION: This exam was  performed according to the departmental dose-optimization program which includes automated exposure control, adjustment of the mA and/or kV according to patient size and/or use of iterative reconstruction technique. CONTRAST:  144m OMNIPAQUE IOHEXOL 300 MG/ML  SOLN COMPARISON:  CTA abdomen and pelvis 04/19/2021 FINDINGS: Lower chest: Left lower lobe posterior basal infiltrates have partially resolved in the interval. Irregular ground-glass opacity is noted in the lateral base of the right upper lobe. This is above the plane of both prior studies. There are small left and trace right pleural effusions. Other lung bases are clear. There is mild cardiomegaly with heavy calcification in the coronary arteries. Hepatobiliary: No mass is seen. Gallbladder and bile ducts are unremarkable. Pancreas: No mass or ductal dilatation. Scattered punctate calcifications of chronic pancreatitis. No evidence of acute pancreatitis. Spleen: Normal. Adrenals/Urinary Tract: Mild nodular thickening left adrenal gland. Right adrenal is unremarkable. The renal cortex unremarkable aside from a 2.6 cm cyst in the posterior left kidney. There are renovascular calcifications about the right renal hilum but no evidence of urinary stones or obstruction. Unremarkable bladder. Stomach/Bowel: Interval laparotomy with overlying skin staples. Subincisional surgical drain noted in the midline. Interval removal of the proximal ascending colon with end to side ileocolic surgical anastomosis. The anastomotic segment is fairly decompressed, but there is a caliber change from decompressed to dilated caliber in the anterior left mid to lower abdomen off the midline, 20-23 cm proximal to the surgical ileocolic junction, where there is a sharply angulated small bowel loop on axial images 53-56 of series 3, and coronal reconstruction image 24 of series 7. Above this level there is small bowel dilatation up to 4 cm, with backup into the proximal small bowel and  gastric fluid distention. This is concerning for small bowel obstruction due to adhesive disease or postsurgical kinking. Some of the left mid abdominal small bowel segments posteriorly are thick-walled but this seems to have been present previously such as due to a chronic enteritis or enteropathy. No bowel pneumatosis or portal venous gas is evident. Large intestine is decompressed without wall thickening with uncomplicated scattered diverticula. Vascular/Lymphatic: There are heavy vascular calcifications. This includes at the SMA origin and mid to distal vessel past the inflection point with stenting of the right renal artery again shown and moderate aortoiliac mixed plaques. There is no AAA. Reproductive: No prostatomegaly. Other: There is mild body wall anasarca, mesenteric edema  which could be postsurgical such as due to fluid overload or due to peritonitis. There is mild abdominal and pelvic free ascites. There is trace free intraperitoneal air in the mid abdomen underlying the incision. No abscess is seen. Mild scattered fluid in the mesenteric folds is also noted. No incarcerated hernia. Musculoskeletal: There are degenerative changes of the spine. No suspicious bone lesions IMPRESSION: 1. Interval removal of the cecum and presumably the terminal ileum which previously showed inflammatory change and wall thickening. Small part of the proximal ascending colon was also removed. 2. 20-23 cm proximal to the surgical ileocolic junction in the anterior mid/lower abdomen off midline there is a sharply angulated small bowel segment, above which the small bowel is diffusely dilated up to 4 cm with backed up fluid distending the stomach. This is concerning for small bowel obstruction due to postsurgical adhesions or postsurgical kinking. 3. Some of the more proximal small bowel segments show fold thickening but this does appear to have been present previously. Superimposed enteritis or ischemic wall edema is not  strictly excluded. No pneumatosis or portal venous gas. 4. Mild abdominal and pelvic free fluid. Only trace free air, probably related to the surgery. 5. Heavy vascular calcifications, including the SMA. 6. Patchy mesenteric edema and body wall anasarca, both probably due to fluid overload and/or postsurgical mesenteric edema. Underlying peritonitis not strictly excluded. 7. Left lower lobe posterior basal infiltrates partially cleared. Small left and trace right pleural effusions. 8. Small irregular ground-glass opacity, lateral base of the right upper lobe, could be atelectasis or pneumonitis. Follow-up recommended. Electronically Signed   By: Telford Nab M.D.   On: 04/24/2021 04:08   DG Abd Portable 1V  Result Date: 04/25/2021 CLINICAL DATA:  Abdominal distension.  Post GI surgery. EXAM: PORTABLE ABDOMEN - 1 VIEW COMPARISON:  01/11/2016.  CT, 04/24/2021. FINDINGS: Multiple loops of distended small bowel are noted throughout the central abdomen with minimal air noted in the colon. Skin staples extend along the low anterior abdominal midline. Bowel anastomosis staples are noted in the right lower quadrant. IMPRESSION: 1. Multiple loops of dilated small bowel with minimal colonic air. Findings are consistent with a partial small bowel obstruction, moderate to high-grade. Findings are also similar to the previous day's CT scan. Electronically Signed   By: Lajean Manes M.D.   On: 04/25/2021 10:50   [place] EKG SITE RITE  Result Date: 04/24/2021 If Site Rite image not attached, placement could not be confirmed due to current cardiac rhythm.    Patient ID: [name], male   DOB: 1956-03-26, 66 y.o.   MRN: [id number] Patient ID: [name], male   DOB: 05/11/1955, 66 y.o.   MRN: [id number] Patient ID: [name], male   DOB: Jan 16, 1956, 66 y.o.   MRN: [id number] Patient ID: [name], male   DOB: March 02, 1956, 66 y.o.   MRN: [id number] Patient ID: [name], male   DOB: [birth year], 66 y.o.   MRN:  [id number]

## 2021-04-26 NOTE — Progress Notes (Signed)
ANTICOAGULATION CONSULT NOTE -follow up  Pharmacy Consult for heparin Indication: Afib  Allergies  Allergen Reactions   Vicodin [Hydrocodone-Acetaminophen] Nausea And Vomiting    Patient Measurements: Height: 6' (182.9 cm) Weight: 81.6 kg (180 lb) IBW/kg (Calculated) : 77.6 Heparin dosing weight:  =TBW 81.6 kg   Vital Signs: Temp: 98.1 F (36.7 C) (01/29 0320) Temp Source: Oral (01/29 0320) BP: 113/59 (01/29 0320) Pulse Rate: 56 (01/29 0320)  Labs: Recent Labs    04/24/21 0127 04/24/21 1224 04/24/21 2024 04/25/21 0106 04/26/21 0253  HGB 9.4* 9.8*  --  9.1* 8.5*  HCT 27.7* 28.3*  --  27.0* 25.6*  PLT 132* 131*  --  126* 143*  HEPARINUNFRC 0.33  --  0.27* 0.43 0.52  CREATININE 1.01  --   --  0.98 0.92     Estimated Creatinine Clearance: 86.7 mL/min (by C-G formula based on SCr of 0.92 mg/dL).   Medical History: Past Medical History:  Diagnosis Date   AKI (acute kidney injury) ([place]) 12/2015   Anxiety    Arthritis    Carotid artery stenosis    Coronary artery disease    Dementia ([place])    problems with short term memory   Depression    DKA (diabetic ketoacidosis) ([place]) 04/16/2021   Dysrhythmia    episode of afib 12/2005 in setting of cocaine use   Gout    Hyperlipidemia    Hypertension    Myocardial infarction [facility])    2017   PAD (peripheral artery disease) ([facility])    Stroke ([facility])    TIA   TIA (transient ischemic attack)    over 20 years ago    Assessment: 66yo male had Eliquis for h/o Afib reversed for urgent ileocecectomy/ex-lap. Plan to bridge with IV heparin for 2-3 days before resuming apixaban.   Heparin held 1/27 due to bloody bowel movement. Repeat CBC came back with a hgb of 9.8. Heparin resumed afternoon of 1/27 with modified goal and no bolus per Dr. Waldron Labs.   This morning's HL is 0.52 on 1350 units/hour. Hgb 8.5, PLTs 143. No overt signs of bleeding or issues with infusion noted.   Goal of Therapy:  Heparin level: 0.3-0.5 Monitor  platelets by anticoagulation protocol: Yes   Plan:  Decrease IV heparin rate to 1300 units/hr Check 6-hour HL Check HL and CBC daily Monitor for s/sx of bleeding. F/u resume oral AC  Donald Pore, PharmD Pharmacy Resident 04/26/2021, 7:23 AM  Please check AMION for all [place] phone numbers After 10:00 PM, call [place] [phone]

## 2021-04-26 NOTE — Plan of Care (Signed)

## 2021-04-27 LAB — CBC
HCT: 25 % — ABNORMAL LOW (ref 39.0–52.0)
Hemoglobin: 8.1 g/dL — ABNORMAL LOW (ref 13.0–17.0)
MCH: 32.3 pg (ref 26.0–34.0)
MCHC: 32.4 g/dL (ref 30.0–36.0)
MCV: 99.6 fL (ref 80.0–100.0)
Platelets: 133 10*3/uL — ABNORMAL LOW (ref 150–400)
RBC: 2.51 MIL/uL — ABNORMAL LOW (ref 4.22–5.81)
RDW: 13.6 % (ref 11.5–15.5)
WBC: 7.6 10*3/uL (ref 4.0–10.5)
nRBC: 0.7 % — ABNORMAL HIGH (ref 0.0–0.2)

## 2021-04-27 LAB — COMPREHENSIVE METABOLIC PANEL WITH GFR
ALT: 20 U/L (ref 0–44)
AST: 22 U/L (ref 15–41)
Albumin: 1.8 g/dL — ABNORMAL LOW (ref 3.5–5.0)
Alkaline Phosphatase: 75 U/L (ref 38–126)
Anion gap: 5 (ref 5–15)
BUN: 9 mg/dL (ref 8–23)
CO2: 23 mmol/L (ref 22–32)
Calcium: 7.6 mg/dL — ABNORMAL LOW (ref 8.9–10.3)
Chloride: 108 mmol/L (ref 98–111)
Creatinine, Ser: 0.88 mg/dL (ref 0.61–1.24)
GFR, Estimated: >60 mL/min
Glucose, Bld: 155 mg/dL — ABNORMAL HIGH (ref 70–99)
Potassium: 3.9 mmol/L (ref 3.5–5.1)
Sodium: 136 mmol/L (ref 135–145)
Total Bilirubin: 0.2 mg/dL — ABNORMAL LOW (ref 0.3–1.2)
Total Protein: 4.6 g/dL — ABNORMAL LOW (ref 6.5–8.1)

## 2021-04-27 LAB — PHOSPHORUS: Phosphorus: 3.2 mg/dL (ref 2.5–4.6)

## 2021-04-27 LAB — GLUCOSE, CAPILLARY
Glucose-Capillary: 170 mg/dL — ABNORMAL HIGH (ref 70–99)
Glucose-Capillary: 213 mg/dL — ABNORMAL HIGH (ref 70–99)
Glucose-Capillary: 202 mg/dL — ABNORMAL HIGH (ref 70–99)
Glucose-Capillary: 207 mg/dL — ABNORMAL HIGH (ref 70–99)
Glucose-Capillary: 190 mg/dL — ABNORMAL HIGH (ref 70–99)
Glucose-Capillary: 175 mg/dL — ABNORMAL HIGH (ref 70–99)

## 2021-04-27 LAB — PREPARE RBC (CROSSMATCH)

## 2021-04-27 LAB — TRIGLYCERIDES: Triglycerides: 73 mg/dL

## 2021-04-27 LAB — HEPARIN LEVEL (UNFRACTIONATED): Heparin Unfractionated: 0.48 IU/mL (ref 0.30–0.70)

## 2021-04-27 LAB — MAGNESIUM: Magnesium: 1.8 mg/dL (ref 1.7–2.4)

## 2021-04-27 MED ORDER — TRAVASOL 10 % IV SOLN
INTRAVENOUS | Status: AC
  Filled 2021-04-27: qty 1081.2

## 2021-04-27 MED ORDER — MAGNESIUM SULFATE 2 GM/50ML IV SOLN
2.0000 g | Freq: Once | INTRAVENOUS | Status: AC
  Administered 2021-04-27: 2 g via INTRAVENOUS
  Filled 2021-04-27: qty 50

## 2021-04-27 MED ORDER — SODIUM CHLORIDE 0.9% IV SOLUTION: Freq: Once | INTRAVENOUS | Status: AC

## 2021-04-27 MED ORDER — INSULIN ASPART 100 UNIT/ML IJ SOLN
0.0000 [IU] | INTRAMUSCULAR | Status: DC
  Administered 2021-04-27: 5 [IU] via SUBCUTANEOUS
  Administered 2021-04-27 – 2021-04-28 (×4): 3 [IU] via SUBCUTANEOUS
  Administered 2021-04-28: 2 [IU] via SUBCUTANEOUS
  Administered 2021-04-28 (×2): 5 [IU] via SUBCUTANEOUS
  Administered 2021-04-29 (×2): 3 [IU] via SUBCUTANEOUS
  Administered 2021-04-29: 5 [IU] via SUBCUTANEOUS
  Administered 2021-04-29: 3 [IU] via SUBCUTANEOUS
  Administered 2021-04-29: 2 [IU] via SUBCUTANEOUS
  Administered 2021-04-30 – 2021-05-01 (×3): 3 [IU] via SUBCUTANEOUS
  Administered 2021-05-01: 2 [IU] via SUBCUTANEOUS
  Administered 2021-05-01: 8 [IU] via SUBCUTANEOUS
  Administered 2021-05-02: 2 [IU] via SUBCUTANEOUS
  Administered 2021-05-02: 5 [IU] via SUBCUTANEOUS
  Administered 2021-05-02 (×2): 2 [IU] via SUBCUTANEOUS
  Administered 2021-05-03: 3 [IU] via SUBCUTANEOUS

## 2021-04-27 NOTE — Plan of Care (Signed)

## 2021-04-27 NOTE — Progress Notes (Signed)
8 Days Post-Op   Subjective/Chief Complaint: Patient is currently having another bowel movement Abdominal cramping from yesterday has resolved CT scan - some swelling at the ileocecal anastomosis, but no sign of abscess   Objective: Vital signs in last 24 hours: Temp:  [97.6 F (36.4 C)-98.9 F (37.2 C)] 98.9 F (37.2 C) (01/30 0757) Pulse Rate:  [54-70] 70 (01/30 0757) Resp:  [14-21] 14 (01/30 0757) BP: (119-165)/(60-79) 139/66 (01/30 0757) SpO2:  [94 %-99 %] 96 % (01/30 0757) Last BM Date: 04/26/21  Intake/Output from previous day: 01/29 0701 - 01/30 0700 In: -  Out: 925 [Urine:450; Stool:475] Intake/Output this shift: No intake/output data recorded. Gen:  Alert, NAD, pleasant Abd: Obese. Abdomen is much softer than yesterday. Less distended Appropriately tender around midline wound.  Midline wound with staples in place and no signs of infection. Penrose drain out.  Psych: A&Ox3  Skin: no rashes noted, warm and dry  Lab Results:  Recent Labs    04/26/21 0253 04/27/21 0238  WBC 8.9 7.6  HGB 8.5* 8.1*  HCT 25.6* 25.0*  PLT 143* 133*   BMET Recent Labs    04/26/21 0253 04/27/21 0238  NA 136 136  K 3.4* 3.9  CL 105 108  CO2 24 23  GLUCOSE 208* 155*  BUN 8 9  CREATININE 0.92 0.88  CALCIUM 7.3* 7.6*   PT/INR No results for input(s): LABPROT, INR in the last 72 hours. ABG No results for input(s): PHART, HCO3 in the last 72 hours.  Invalid input(s): PCO2, PO2  Studies/Results: CT ABDOMEN PELVIS W CONTRAST  Result Date: 04/26/2021 CLINICAL DATA:  Abdominal pain. Small-bowel obstruction. Recently postop from ileocecectomy for ischemic bowel. EXAM: CT ABDOMEN AND PELVIS WITH CONTRAST TECHNIQUE: Multidetector CT imaging of the abdomen and pelvis was performed using the standard protocol following bolus administration of intravenous contrast. RADIATION DOSE REDUCTION: This exam was performed according to the departmental dose-optimization program which includes  automated exposure control, adjustment of the mA and/or kV according to patient size and/or use of iterative reconstruction technique. CONTRAST:  164mL OMNIPAQUE IOHEXOL 300 MG/ML  SOLN COMPARISON:  04/24/2021 FINDINGS: Lower Chest: No acute findings. Hepatobiliary: No hepatic masses identified. Gallbladder is unremarkable. No evidence of biliary ductal dilatation. Pancreas:  No mass or inflammatory changes. Spleen: Within normal limits in size and appearance. Adrenals/Urinary Tract: No masses identified. Stable small left renal cyst. No evidence of ureteral calculi or hydronephrosis. Stomach/Bowel: Mild wall thickening is again seen involving the ascending colon and ileocolonic anastomosis, without significant change since prior study. Mild to moderately dilated small bowel loops are again seen containing air-fluid levels, with transition point near the ileocolonic anastomosis. This is likely due to partial obstruction from postop edema. No other areas of bowel wall thickening are seen. No evidence of abscess or free intraperitoneal air. Vascular/Lymphatic: No pathologically enlarged lymph nodes. No acute vascular findings. Aortic atherosclerotic calcification noted. Reproductive:  No mass or other significant abnormality. Other: aMinimal ascites shows mild decrease since previous study. Musculoskeletal:  No suspicious bone lesions identified. IMPRESSION: Similar appearance of distal small bowel obstruction, with transition point near the ileocecal anastomosis. Wall thickening of the ileocolonic anastomosis is also similar in appearance, and likely represents postop edema. Minimal ascites. No evidence of abscess or free intraperitoneal air. Aortic Atherosclerosis (ICD10-I70.0). Electronically Signed   By: Marlaine Hind M.D.   On: 04/26/2021 16:13   DG Abd Portable 1V  Result Date: 04/25/2021 CLINICAL DATA:  Abdominal distension.  Post GI surgery. EXAM: PORTABLE ABDOMEN -  1 VIEW COMPARISON:  01/11/2016.  CT,  04/24/2021. FINDINGS: Multiple loops of distended small bowel are noted throughout the central abdomen with minimal air noted in the colon. Skin staples extend along the low anterior abdominal midline. Bowel anastomosis staples are noted in the right lower quadrant. IMPRESSION: 1. Multiple loops of dilated small bowel with minimal colonic air. Findings are consistent with a partial small bowel obstruction, moderate to high-grade. Findings are also similar to the previous day's CT scan. Electronically Signed   By: Lajean Manes M.D.   On: 04/25/2021 10:50    Anti-infectives: Anti-infectives (From admission, onward)    Start     Dose/Rate Route Frequency Ordered Stop   04/20/21 0200  piperacillin-tazobactam (ZOSYN) IVPB 3.375 g  Status:  Discontinued        3.375 g 12.5 mL/hr over 240 Minutes Intravenous Every 8 hours 04/19/21 1649 04/20/21 0105   04/20/21 0200  piperacillin-tazobactam (ZOSYN) IVPB 3.375 g        3.375 g 12.5 mL/hr over 240 Minutes Intravenous Every 8 hours 04/20/21 0105 04/24/21 0314   04/20/21 0000  piperacillin-tazobactam (ZOSYN) IVPB 3.375 g  Status:  Discontinued        3.375 g 100 mL/hr over 30 Minutes Intravenous Every 6 hours 04/19/21 2334 04/20/21 0105   04/19/21 1745  piperacillin-tazobactam (ZOSYN) IVPB 3.375 g        3.375 g 100 mL/hr over 30 Minutes Intravenous  Once 04/19/21 1649 04/19/21 1910       Assessment/Plan: POD 8 s/p exploratory laparotomy with ileocecectomy for 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 Thermon Leyland 1/22 - Patient has resumed bowel function and no further bloody bm's. Symptomatically improved and exam has improved as well. VSS and reassuring without tachycardia or hypotension. He remains afebrile with wbc wnl.  - TRH planning 1 u PRBC today - Cont TPN  - clear liquids   FEN: CLD, TPN ID: [email or web address] 1/22 - 1/26 VTE: hep gtt 1/23 >> platelets 143   Per primary: T2DM with DKA AKI Afib on  eliquis baseline HTN     LOS: 11 days    [name] 04/27/2021

## 2021-04-27 NOTE — Progress Notes (Signed)
PHARMACY - TOTAL PARENTERAL NUTRITION CONSULT NOTE   Indication: Small bowel obstruction  Patient Measurements: Height: 6' (182.9 cm) Weight: 81.6 kg (180 lb) IBW/kg (Calculated) : 77.6 TPN AdjBW (KG): 81.6 Body mass index is 24.41 kg/m. Usual Weight:    Assessment: Admitted with new-onset DM and DKA. Then had acute onset of abdominal pain and was found to have new ischemic bowel; underwent laparotomy with ileocecal resection  on 04/19/21. - New Bloody BM noted 1/27 overnight + abdominal pain with SBO on CT. Surgery feels pt may have ileus on exam 1/27.   Glucose / Insulin: DM on mod SSI, Semglee 22 units/d. CBGs improved - 125-213, with only 1 value >200 in last 24hrs (19 units SSI utilized). DKA improved. Electrolytes: K up to 3.9 (s/p K runs x 4 + KCl 64mEq PO x 1 yesterday), Mg 1.8 (s/p Mag sulfate 4g IV x 1 yesterday) -goal K>=4, Mg>=2 with ileus Renal: Scr <1 stable, BUN WNL Hepatic: Albumin 1.8, LFTs / Tbili / TG WNL Intake / Output; MIVF: MIVF: LR at [place] 1/29, 45ml stool output documented UOP 0.2 ml/kg/hr per documentation GI Imaging: - 1/22: CT: Wall thickening and adjacent inflammatory fat stranding of the cecal base and terminal ileum, involving approximately 20 cm of the terminal ileum, consistent with nonspecific infectious or inflammatory terminal ileitis, - 1/27: Abd CT: concerning for SBO - 1/29 CT Abd - similar appearing distal SBO, swelling at ileocecal anastomosis but no signs of abscess GI Surgeries / Procedures: - 1/22: acute onset of abdominal pain and was found to have new ischemic bowel he underwent laparotomy with ileocecal resection  Central access: PICC 1/27 TPN start date: 04/24/21  Nutritional Goals: Goal TPN rate is 85 mL/hr (provides 108 g of protein and 2105 kcals per day)  RD Assessment: Estimated Needs Total Energy Estimated Needs: 2100-2300 Total Protein Estimated Needs: 105-120 grams Total Fluid Estimated Needs: >/= 2.1 L  Current  Nutrition:  NPO >> back to [facility] 1/30 TPN  Plan:  Increase TPN to goal rate 85 ml/hr at 1800. TPN will provide 108g protein and 2105 kCal, meeting 100% of needs Electrolytes in TPN: Na 70mEq/L, adjust K to 9mEq/L with rate increase, Ca 25mEq/L, increase Mg to 60mEq/L, and Phos 46mmol/L. Cl:Ac 1:1 Add standard MVI and trace elements to TPN Adjust moderate SSI to q4h + Semglee 22 units daily + adjust regular insulin to 30 units added to TPN bag (with rate increase). Monitor CBGs and adjust as needed Monitor TPN labs on Mon/Thurs and PRN F/u General Surgery plans   Arturo Morton, PharmD, BCPS Please check AMION for all [facility] contact numbers Clinical Pharmacist 04/27/2021 7:48 AM

## 2021-04-27 NOTE — Progress Notes (Signed)
PROGRESS NOTE                                                                                                                                                                                                             Patient Demographics:    [name], is a 66 y.o. male, DOB - 1955/09/05, AQT:[id number]  Outpatient Primary MD for the patient is [name], MD    LOS - 3  Admit date - 04/16/2021    Chief Complaint  Patient presents with   Hyperglycemia       Brief Narrative   66 y.o. male with medical history significant of CAD, HTN.  He presented to the ER with about 5 to 7-day history of feeling weak with increase in urination and thirst along with some tremors.  In the ER he was diagnosed with new onset DM type II with DKA and admitted to the hospital.  He was treated for DKA and stabilized he was about to be discharged on 04/19/2021 but right before being discharged he developed acute onset of abdominal pain and was found to have new ischemic bowel he underwent laparotomy with ileocecal resection and now recovering from the surgery.   Subjective:   Patient with significant pain yesterday after bowel movement, this has resolved by now, he had bowel movement with no evidence of any blood with it.      Assessment  & Plan :    No notes have been filed under this hospital service. Service: Hospitalist   New diagnosis of DM type II with DKA on presentation - with severe dehydration - AKI, pseudohyponatremia and metabolic acidosis due to DKA. -He was treated with DKA protocol along with aggressive IV fluids and electrolyte replacement, gap is closed he has been transitioned to Lantus and sliding scale, dose adjusted on 04/20/2021 as he is n.p.o. postop.  Continue to monitor and adjust closely. -Currently controlled on 8 units of Lantus and insulin sliding scale.  He is on TPN, has been transitioned to added insulin  to TPN.  Continue with sliding scale every 6 hours.  CBG (last 3)  Recent Labs    04/27/21 0634 04/27/21 0755 04/27/21 1148  GLUCAP 213* 202* 207*   Lab Results  Component Value Date   HGBA1C >15.5 (H) 04/19/2021     Ischemic bowel /embolic mesenteric  ischemia - patient sudden onset of periumbilical abdominal pain on 04/19/2021.  Work-up suggestive of ischemic bowel, seen by vascular surgery and general surgery . -  s/p dx lap converted to ex lap with ileocecectomy for 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 Thermon Leyland 1/22 - Continue hydration, continue heparin drip.  He has plenty of risk factors which are PAD,CAD, Smoking, Afib - Heparin drip resumed on 04/20/2021 after discussions with general surgery.  On IV Zosyn. -Patient had bloody BM 1/26, likely old blood, H&H is stable, resumed back on TTE, especially he presents with embolic ischemic bowel.  Continue to monitor CBC closely. -Patient currently n.p.o., as evidence of postoperative SBO, so far no nausea and vomiting, so we will hold on NG tube, advancing diet per general surgery.   -Started on TPN on 1/27. -An episode of significant abdominal pain with bowel movement yesterday, CT abdomen pelvis was obtained, with no acute findings, clear liquid diet.  Chronic blood loss anemia -In the setting of GI surgery, and prolonged hospitalization, hemoglobin is 8.1 today, no evidence of significant GI bleed at this point, I will continue with his heparin GTT, but I will give 1 unit PRBC today.   Paroxysmal atrial fibrillation.  Continue beta-blocker and heparin instead of Eliquis in the perioperative period.  Mali vas 2 score of greater than 3. -Continue to monitor closely while on heparin GTT.  Hypertension.  Blood pressure is better on beta-blocker monitor add as needed hydralazine..  Dyslipidemia on statin and Vascepa.  AKI on CKD 3A with baseline creatinine of 1.5, AKI due to  severe dehydration - holding ARB and HCTZ, has been aggressively hydrated with IV fluids and AKI seems to have resolved.  Hypokalemia -potassium is low at 3.3 today, repleted.  Hypomagnesemia.  Aggressively replaced.  H/O gout.  On allopurinol.  Encouraged the patient to sit up in chair in the daytime use I-S and flutter valve for pulmonary toiletry.  Will advance activity and titrate down oxygen as possible.  Thrombocytopenia -Improving, continue to monitor closely as on heparin GTT.      Condition - Fair  Family Communication  : None at bedside  Code Status :  Full  Consults  :  None  PUD Prophylaxis : PPI   Procedures  :     Exploratory laparotomy with Ileocecectomy - by [facility] 04/20/21   CT - 1. Mild circumferential wall thickening of the distal ileum with adjacent inflammation and small amount of free fluid. This is nonspecific but may represent an infectious or inflammatory enteritis. Given very heavy mesenteric arterial calcifications, ischemia is not entirely excluded but no evidence of pneumatosis intestinalis or pneumoperitoneum. Mildly distended loops of fluid-filled small bowel proximally without transition point, which may represent a mild ileus or low-grade partial obstruction at the distal ileum. 2. Mild bilateral LOWER lobe cylindrical bronchiectasis with minimal tree in bud opacities in the LEFT LOWER lobe, likely representing a mild infectious or inflammatory process. 3. Coronary artery disease and aortic Atherosclerosis (ICD10-I70.0).       Disposition Plan  :    Status is: Inpatient  Remains inpatient appropriate because: DKA  DVT Prophylaxis  :  on heparin GTT  Place TED hose Start: 04/18/21 1101     Lab Results  Component Value Date   PLT 133 (L) 04/27/2021    Diet :  Diet Order             Diet NPO time specified Except for:  Sips with Meds  Diet effective now                    Inpatient Medications  Scheduled Meds:  sodium chloride    Intravenous Once   acetaminophen  650 mg Oral Q6H   allopurinol  100 mg Oral Daily   atorvastatin  80 mg Oral QHS   Chlorhexidine Gluconate Cloth  6 each Topical Daily   docusate sodium  100 mg Oral BID   escitalopram  20 mg Oral Daily   icosapent Ethyl  2 g Oral BID   insulin aspart  0-15 Units Subcutaneous Q4H   insulin glargine-yfgn  22 Units Subcutaneous Daily   insulin starter kit- pen needles  1 kit Other Once   living well with diabetes book   Does not apply Once   metoprolol tartrate  25 mg Oral BID   pantoprazole  40 mg Oral Daily   sodium chloride flush  10-40 mL Intracatheter Q12H   traZODone  100 mg Oral QHS   Continuous Infusions:  heparin 1,250 Units/hr (04/27/21 0835)   lactated ringers 10 mL/hr at 04/25/21 1800   methocarbamol (ROBAXIN) IV     TPN ADULT (ION) 60 mL/hr at 04/26/21 1810   TPN ADULT (ION)      PRN Meds:.busPIRone, dextrose, hydrALAZINE, HYDROmorphone (DILAUDID) injection, hydrOXYzine, methocarbamol (ROBAXIN) IV, metoprolol tartrate, ondansetron (ZOFRAN) IV, oxyCODONE, prochlorperazine, simethicone, sodium chloride flush  Antibiotics  :    Anti-infectives (From admission, onward)    Start     Dose/Rate Route Frequency Ordered Stop   04/20/21 0200  piperacillin-tazobactam (ZOSYN) IVPB 3.375 g  Status:  Discontinued        3.375 g 12.5 mL/hr over 240 Minutes Intravenous Every 8 hours 04/19/21 1649 04/20/21 0105   04/20/21 0200  piperacillin-tazobactam (ZOSYN) IVPB 3.375 g        3.375 g 12.5 mL/hr over 240 Minutes Intravenous Every 8 hours 04/20/21 0105 04/24/21 0314   04/20/21 0000  piperacillin-tazobactam (ZOSYN) IVPB 3.375 g  Status:  Discontinued        3.375 g 100 mL/hr over 30 Minutes Intravenous Every 6 hours 04/19/21 2334 04/20/21 0105   04/19/21 1745  piperacillin-tazobactam (ZOSYN) IVPB 3.375 g        3.375 g 100 mL/hr over 30 Minutes Intravenous  Once 04/19/21 1649 04/19/21 1910      \   Emeline Gins Shera Laubach M.D on 04/27/2021 at  12:13 PM  To page go to [email or web address]   [facility] -  Office  [phone]  See all Orders from today for further details    Objective:   Vitals:   04/26/21 2325 04/27/21 0332 04/27/21 0757 04/27/21 1146  BP: 119/64 120/60 139/66 128/71  Pulse: (!) 54 (!) 59 70 (!) 55  Resp: 17 (!) _0 Temp: 98.6 F (37 C) 98.3 F (36.8 C) 98.9 F (37.2 C) 98.9 F (37.2 C)  TempSrc: Oral Oral Oral Oral  SpO2: 95% 95% 96% 95%  Weight:      Height:        Wt Readings from Last 3 Encounters:  04/16/21 81.6 kg  03/12/21 85.3 kg  09/09/20 86.8 kg     Intake/Output Summary (Last 24 hours) at 04/27/2021 1213 Last data filed at 04/27/2021 1146 Gross per 24 hour  Intake --  Output 1025 ml  Net -1025 ml     Physical Exam  Awake Alert, Oriented X 3, No new F.N deficits, Normal affect Symmetrical Chest  wall movement, Good air movement bilaterally, CTAB RRR,No Gallops,Rubs or new Murmurs, No Parasternal Heave +ve B.Sounds, Abd Soft, surgical incision with staples, with no oozing or discharge, No Cyanosis, Clubbing or edema, No new Rash or bruise        Data Review:    CBC Recent Labs  Lab 04/24/21 0127 04/24/21 1224 04/25/21 0106 04/26/21 0253 04/27/21 0238  WBC 6.8 7.1 8.1 8.9 7.6  HGB 9.4* 9.8* 9.1* 8.5* 8.1*  HCT 27.7* 28.3* 27.0* 25.6* 25.0*  PLT 132* 131* 126* 143* 133*  MCV 95.2 95.0 96.1 98.1 99.6  MCH 32.3 32.9 32.4 32.6 32.3  MCHC 33.9 34.6 33.7 33.2 32.4  RDW 13.2 13.3 13.2 13.2 13.6    Electrolytes Recent Labs  Lab 04/21/21 0443 04/22/21 0225 04/23/21 0125 04/24/21 0127 04/25/21 0106 04/26/21 0253 04/27/21 0238  NA 138 138 138 137 137 136 136  K 3.8 3.9 3.3* 3.6 3.7 3.4* 3.9  CL 109 106 108 106 108 105 108  CO2 _0 GLUCOSE 112* 90 160* 165* 215* 208* 155*  BUN _1 7* _2 CREATININE 1.19 1.22 1.17 1.01 0.98 0.92 0.88  CALCIUM 6.8* 7.3* 7.2* 7.3* 7.2* 7.3* 7.6*  AST _3 36  --  22  ALT _4 --  20  ALKPHOS 48 80 67 83 77  --  75  BILITOT 0.7 0.4 0.4 0.4 0.4  --  0.2*  ALBUMIN 1.9* 1.8* 1.7* 1.8* 1.8*  --  1.8*  MG 2.0 1.8 1.4* 1.6* 1.8 1.7 1.8  PROCALCITON 1.38 0.92 0.66 0.43  --   --   --     ------------------------------------------------------------------------------------------------------------------ Recent Labs    04/25/21 0106 04/27/21 0238  TRIG 67 73    Lab Results  Component Value Date   HGBA1C >15.5 (H) 04/19/2021    No results for input(s): TSH, T4TOTAL, T3FREE, THYROIDAB in the last 72 hours.  Invalid input(s): FREET3  ------------------------------------------------------------------------------------------------------------------ ID Labs Recent Labs  Lab 04/21/21 0443 04/22/21 0225 04/23/21 0125 04/24/21 0127 04/24/21 1224 04/25/21 0106 04/26/21 0253 04/27/21 0238  WBC 6.4 7.3 6.0 6.8 7.1 8.1 8.9 7.6  PLT 91* 111* 109* 132* 131* 126* 143* 133*  PROCALCITON 1.38 0.92 0.66 0.43  --   --   --   --   CREATININE 1.19 1.22 1.17 1.01  --  0.98 0.92 0.88   Cardiac Enzymes No results for input(s): CKMB, TROPONINI, MYOGLOBIN in the last 168 hours.  Invalid input(s): CK  Radiology Reports CT ABDOMEN PELVIS W CONTRAST  Result Date: 04/26/2021 CLINICAL DATA:  Abdominal pain. Small-bowel obstruction. Recently postop from ileocecectomy for ischemic bowel. EXAM: CT ABDOMEN AND PELVIS WITH CONTRAST TECHNIQUE: Multidetector CT imaging of the abdomen and pelvis was performed using the standard protocol following bolus administration of intravenous contrast. RADIATION DOSE REDUCTION: This exam was performed according to the departmental dose-optimization program which includes automated exposure control, adjustment of the mA and/or kV according to patient size and/or use of iterative reconstruction technique. CONTRAST:  146m OMNIPAQUE IOHEXOL 300 MG/ML  SOLN COMPARISON:  04/24/2021 FINDINGS: Lower Chest: No acute findings. Hepatobiliary: No hepatic masses  identified. Gallbladder is unremarkable. No evidence of biliary ductal dilatation. Pancreas:  No mass or inflammatory changes. Spleen: Within normal limits in size and appearance. Adrenals/Urinary Tract: No masses identified. Stable small left renal cyst. No evidence of ureteral calculi or hydronephrosis. Stomach/Bowel: Mild wall thickening is again  seen involving the ascending colon and ileocolonic anastomosis, without significant change since prior study. Mild to moderately dilated small bowel loops are again seen containing air-fluid levels, with transition point near the ileocolonic anastomosis. This is likely due to partial obstruction from postop edema. No other areas of bowel wall thickening are seen. No evidence of abscess or free intraperitoneal air. Vascular/Lymphatic: No pathologically enlarged lymph nodes. No acute vascular findings. Aortic atherosclerotic calcification noted. Reproductive:  No mass or other significant abnormality. Other: aMinimal ascites shows mild decrease since previous study. Musculoskeletal:  No suspicious bone lesions identified. IMPRESSION: Similar appearance of distal small bowel obstruction, with transition point near the ileocecal anastomosis. Wall thickening of the ileocolonic anastomosis is also similar in appearance, and likely represents postop edema. Minimal ascites. No evidence of abscess or free intraperitoneal air. Aortic Atherosclerosis (ICD10-I70.0). Electronically Signed   By: Marlaine Hind M.D.   On: 04/26/2021 16:13   CT ABDOMEN PELVIS W CONTRAST  Result Date: 04/24/2021 CLINICAL DATA:  Abdominal pain. EXAM: CT ABDOMEN AND PELVIS WITH CONTRAST TECHNIQUE: Multidetector CT imaging of the abdomen and pelvis was performed using the standard protocol following bolus administration of intravenous contrast. RADIATION DOSE REDUCTION: This exam was performed according to the departmental dose-optimization program which includes automated exposure control, adjustment of  the mA and/or kV according to patient size and/or use of iterative reconstruction technique. CONTRAST:  179mL OMNIPAQUE IOHEXOL 300 MG/ML  SOLN COMPARISON:  CTA abdomen and pelvis 04/19/2021 FINDINGS: Lower chest: Left lower lobe posterior basal infiltrates have partially resolved in the interval. Irregular ground-glass opacity is noted in the lateral base of the right upper lobe. This is above the plane of both prior studies. There are small left and trace right pleural effusions. Other lung bases are clear. There is mild cardiomegaly with heavy calcification in the coronary arteries. Hepatobiliary: No mass is seen. Gallbladder and bile ducts are unremarkable. Pancreas: No mass or ductal dilatation. Scattered punctate calcifications of chronic pancreatitis. No evidence of acute pancreatitis. Spleen: Normal. Adrenals/Urinary Tract: Mild nodular thickening left adrenal gland. Right adrenal is unremarkable. The renal cortex unremarkable aside from a 2.6 cm cyst in the posterior left kidney. There are renovascular calcifications about the right renal hilum but no evidence of urinary stones or obstruction. Unremarkable bladder. Stomach/Bowel: Interval laparotomy with overlying skin staples. Subincisional surgical drain noted in the midline. Interval removal of the proximal ascending colon with end to side ileocolic surgical anastomosis. The anastomotic segment is fairly decompressed, but there is a caliber change from decompressed to dilated caliber in the anterior left mid to lower abdomen off the midline, 20-23 cm proximal to the surgical ileocolic junction, where there is a sharply angulated small bowel loop on axial images 53-56 of series 3, and coronal reconstruction image 24 of series 7. Above this level there is small bowel dilatation up to 4 cm, with backup into the proximal small bowel and gastric fluid distention. This is concerning for small bowel obstruction due to adhesive disease or postsurgical kinking.  Some of the left mid abdominal small bowel segments posteriorly are thick-walled but this seems to have been present previously such as due to a chronic enteritis or enteropathy. No bowel pneumatosis or portal venous gas is evident. Large intestine is decompressed without wall thickening with uncomplicated scattered diverticula. Vascular/Lymphatic: There are heavy vascular calcifications. This includes at the SMA origin and mid to distal vessel past the inflection point with stenting of the right renal artery again shown and moderate aortoiliac mixed  plaques. There is no AAA. Reproductive: No prostatomegaly. Other: There is mild body wall anasarca, mesenteric edema which could be postsurgical such as due to fluid overload or due to peritonitis. There is mild abdominal and pelvic free ascites. There is trace free intraperitoneal air in the mid abdomen underlying the incision. No abscess is seen. Mild scattered fluid in the mesenteric folds is also noted. No incarcerated hernia. Musculoskeletal: There are degenerative changes of the spine. No suspicious bone lesions IMPRESSION: 1. Interval removal of the cecum and presumably the terminal ileum which previously showed inflammatory change and wall thickening. Small part of the proximal ascending colon was also removed. 2. 20-23 cm proximal to the surgical ileocolic junction in the anterior mid/lower abdomen off midline there is a sharply angulated small bowel segment, above which the small bowel is diffusely dilated up to 4 cm with backed up fluid distending the stomach. This is concerning for small bowel obstruction due to postsurgical adhesions or postsurgical kinking. 3. Some of the more proximal small bowel segments show fold thickening but this does appear to have been present previously. Superimposed enteritis or ischemic wall edema is not strictly excluded. No pneumatosis or portal venous gas. 4. Mild abdominal and pelvic free fluid. Only trace free air, probably  related to the surgery. 5. Heavy vascular calcifications, including the SMA. 6. Patchy mesenteric edema and body wall anasarca, both probably due to fluid overload and/or postsurgical mesenteric edema. Underlying peritonitis not strictly excluded. 7. Left lower lobe posterior basal infiltrates partially cleared. Small left and trace right pleural effusions. 8. Small irregular ground-glass opacity, lateral base of the right upper lobe, could be atelectasis or pneumonitis. Follow-up recommended. Electronically Signed   By: Telford Nab M.D.   On: 04/24/2021 04:08   DG Abd Portable 1V  Result Date: 04/25/2021 CLINICAL DATA:  Abdominal distension.  Post GI surgery. EXAM: PORTABLE ABDOMEN - 1 VIEW COMPARISON:  01/11/2016.  CT, 04/24/2021. FINDINGS: Multiple loops of distended small bowel are noted throughout the central abdomen with minimal air noted in the colon. Skin staples extend along the low anterior abdominal midline. Bowel anastomosis staples are noted in the right lower quadrant. IMPRESSION: 1. Multiple loops of dilated small bowel with minimal colonic air. Findings are consistent with a partial small bowel obstruction, moderate to high-grade. Findings are also similar to the previous day's CT scan. Electronically Signed   By: Lajean Manes M.D.   On: 04/25/2021 10:50   [place] EKG SITE RITE  Result Date: 04/24/2021 If Site Rite image not attached, placement could not be confirmed due to current cardiac rhythm.    Patient ID: [name], male   DOB: Jul 19, 1955, 66 y.o.   MRN: [id number] Patient ID: [name], male   DOB: 1955-11-22, 66 y.o.   MRN: [id number] Patient ID: [name], male   DOB: 01/09/1956, 66 y.o.   MRN: [id number] Patient ID: [name], male   DOB: [birth year], 66 y.o.   MRN: [id number] Patient ID: [name], male   DOB: 09-30-1955, 67 y.o.   MRN: [id number] Patient ID: [name], male   DOB: 1955-10-06, 66 y.o.   MRN: [id number]

## 2021-04-27 NOTE — Progress Notes (Signed)
ANTICOAGULATION CONSULT NOTE  Pharmacy Consult for Heparin  Indication: atrial fibrillation  Allergies  Allergen Reactions   Vicodin [Hydrocodone-Acetaminophen] Nausea And Vomiting    Patient Measurements: Height: 6' (182.9 cm) Weight: 81.6 kg (180 lb) IBW/kg (Calculated) : 77.6  Heparin Dosing Weight: (TBW) 82 kg  Vital Signs: Temp: 98.9 F (37.2 C) (01/30 0757) Temp Source: Oral (01/30 0757) BP: 139/66 (01/30 0757) Pulse Rate: 70 (01/30 0757)  Labs: Recent Labs    04/25/21 0106 04/26/21 0253 04/26/21 1500 04/27/21 0238  HGB 9.1* 8.5*  --  8.1*  HCT 27.0* 25.6*  --  25.0*  PLT 126* 143*  --  133*  HEPARINUNFRC 0.43 0.52 0.35 0.48  CREATININE 0.98 0.92  --  0.88    Estimated Creatinine Clearance: 90.6 mL/min (by C-G formula based on SCr of 0.88 mg/dL).   Assessment: 66yo male with medical history significant for atrial fibrillation on Eliquis PTA, now on hold and s/p reversal w/ Kcentra for urgent ileocecectomy/ex-lap on 1/22. Pharmacy consulted to bridge with IV heparin until able to resume apixaban.    Heparin infusion held 1/27 due to bloody bowel movement. Repeat CBC came back with a hgb of 9.8. Heparin resumed afternoon of 1/27 with modified goal and no bolus per Dr. Waldron Labs.    CBC slowly trending down, hgb 8.1, Plts 133. No overt signs of bleeding or issues with infusion noted. Per chart review, x1 pRBCs to be given 1/30. Heparin level therapeutic at 0.35 and 0.48 respectively on heparin 1300 units/hour. Will empirically decrease rate to prevent heparin from becoming supratherapeutic.  Goal of Therapy:  Heparin level 0.3-0.5 units/ml Monitor platelets by anticoagulation protocol: Yes   Plan:  Decrease heparin infusion at 1250 units/hr Check heparin level in daily while on heparin Continue to monitor H&H and platelets    Thank you for allowing pharmacy to be a part of this patients care.  Ardyth Harps, PharmD Clinical Pharmacist

## 2021-04-27 NOTE — Progress Notes (Addendum)
Physical Therapy Treatment and Discharge Patient Details Name: [name] MRN: [id number] DOB: 1955/09/13 Today's Date: 04/27/2021   History of Present Illness 66 y/o male admitted 1/19 with weakness, increased urination and tremors.  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 PMH includes: anxiety, AKI, CAD, dementia, DKA, CVA, TIA.    PT Comments    Patient doing very well with balance and gait. All goals met. Will refer pt to Mobility Tech for this nursing unit for continued ambulation.    Recommendations for follow up therapy are one component of a multi-disciplinary discharge planning process, led by the attending physician.  Recommendations may be updated based on patient status, additional functional criteria and insurance authorization.  Follow Up Recommendations  No PT follow up     Assistance Recommended at Discharge PRN  Patient can return home with the following A little help with bathing/dressing/bathroom;Assistance with cooking/housework   Equipment Recommendations  None recommended by PT    Recommendations for Other Services       Precautions / Restrictions Precautions Precautions: Other (comment) Precaution Comments: abdominal incision     Mobility  Bed Mobility               General bed mobility comments: Pt up in chair    Transfers Overall transfer level: Needs assistance Equipment used: None Transfers: Sit to/from Stand Sit to Stand: Independent           General transfer comment: from recliner x 2, from toilet    Ambulation/Gait Ambulation/Gait assistance: Independent Gait Distance (Feet): 370 Feet Assistive device: None Gait Pattern/deviations: Step-through pattern, Decreased stride length Gait velocity: decr     General Gait Details: PT pushed IV pole to assure pt did not need UE support and with no imbalance   Stairs             Wheelchair Mobility     Modified Rankin (Stroke Patients Only)       Balance Overall balance assessment: No apparent balance deficits (not formally assessed) Sitting-balance support: No upper extremity supported, Feet supported Sitting balance-Leahy Scale: Good     Standing balance support: No upper extremity supported Standing balance-Leahy Scale: Good                              Cognition Arousal/Alertness: Awake/alert Behavior During Therapy: WFL for tasks assessed/performed Overall Cognitive Status: Within Functional Limits for tasks assessed                                 General Comments: able to follow multiple commands, demonstrated good safety and awareness of multiple lines        Exercises      General Comments        Pertinent Vitals/Pain Pain Assessment Pain Assessment: Faces Faces Pain Scale: Hurts little more Breathing: normal Negative Vocalization: none Facial Expression: smiling or inexpressive Body Language: relaxed Consolability: no need to console PAINAD Score: 0 Pain Location: abdomen Pain Descriptors / Indicators: Discomfort, Guarding Pain Intervention(s): Limited activity within patient's tolerance, Monitored during session, Patient requesting pain meds-RN notified    Home Living Family/patient expects to be discharged to:: Private residence Living Arrangements: Children;Other relatives Available Help at Discharge: Family;Available 24 hours/day Type of Home: Mobile home Home Access: Stairs to enter Entrance Stairs-Rails: Right;Left;Can reach both Entrance [employer]  of Steps: 4   Home Layout: One level Home Equipment: Conservation officer, nature (2 wheels);Cane - single point      Prior Function            PT Goals (current goals can now be found in the care plan section) Acute Rehab PT Goals Patient Stated Goal: go home PT Goal Formulation: With patient Time For Goal Achievement: 05/01/21 Potential to Achieve Goals:  Good Progress towards PT goals: Goals met/education completed, patient discharged from PT    Frequency           PT Plan Current plan remains appropriate    Co-evaluation              AM-PAC PT "6 Clicks" Mobility   Outcome Measure  Help needed turning from your back to your side while in a flat bed without using bedrails?: None Help needed moving from lying on your back to sitting on the side of a flat bed without using bedrails?: None Help needed moving to and from a bed to a chair (including a wheelchair)?: None Help needed standing up from a chair using your arms (e.g., wheelchair or bedside chair)?: None Help needed to walk in hospital room?: None Help needed climbing 3-5 steps with a railing? : A Little 6 Click Score: 23    End of Session   Activity Tolerance: Patient tolerated treatment well Patient left: in chair;with call bell/phone within reach Nurse Communication: Mobility status PT Visit Diagnosis: Other abnormalities of gait and mobility (R26.89)   PT Discharge Note  Patient is being discharged from PT services secondary to:  Goals met and no further therapy needs identified.  Please see latest Therapy Progress Note for current level of functioning and progress toward goals.  Progress and discharge plan and discussed with patient/caregiver and they  Agree   Time: 2423-5361 PT Time Calculation (min) (ACUTE ONLY): 15 min  Charges:  $Gait Training: 8-22 mins                      Arby Barrette, PT [place]  Pager [phone] Office [phone]    Rexanne Mano 04/27/2021, 2:41 PM

## 2021-04-28 LAB — BASIC METABOLIC PANEL
Anion gap: 6 (ref 5–15)
BUN: 12 mg/dL (ref 8–23)
CO2: 23 mmol/L (ref 22–32)
Calcium: 8 mg/dL — ABNORMAL LOW (ref 8.9–10.3)
Chloride: 110 mmol/L (ref 98–111)
Creatinine, Ser: 0.95 mg/dL (ref 0.61–1.24)
GFR, Estimated: >60 mL/min (ref >60)
Glucose, Bld: 128 mg/dL — ABNORMAL HIGH (ref 70–99)
Potassium: 4.6 mmol/L (ref 3.5–5.1)
Sodium: 139 mmol/L (ref 135–145)

## 2021-04-28 LAB — GLUCOSE, CAPILLARY
Glucose-Capillary: 112 mg/dL — ABNORMAL HIGH (ref 70–99)
Glucose-Capillary: 131 mg/dL — ABNORMAL HIGH (ref 70–99)
Glucose-Capillary: 155 mg/dL — ABNORMAL HIGH (ref 70–99)
Glucose-Capillary: 179 mg/dL — ABNORMAL HIGH (ref 70–99)
Glucose-Capillary: 201 mg/dL — ABNORMAL HIGH (ref 70–99)
Glucose-Capillary: 235 mg/dL — ABNORMAL HIGH (ref 70–99)

## 2021-04-28 LAB — CBC
HCT: 26.8 % — ABNORMAL LOW (ref 39.0–52.0)
Hemoglobin: 8.7 g/dL — ABNORMAL LOW (ref 13.0–17.0)
MCH: 32 pg (ref 26.0–34.0)
MCHC: 32.5 g/dL (ref 30.0–36.0)
MCV: 98.5 fL (ref 80.0–100.0)
Platelets: 132 10*3/uL — ABNORMAL LOW (ref 150–400)
RBC: 2.72 MIL/uL — ABNORMAL LOW (ref 4.22–5.81)
RDW: 15.4 % (ref 11.5–15.5)
WBC: 7.3 10*3/uL (ref 4.0–10.5)
nRBC: 1.1 % — ABNORMAL HIGH (ref 0.0–0.2)

## 2021-04-28 LAB — HEPARIN LEVEL (UNFRACTIONATED): Heparin Unfractionated: 0.44 IU/mL (ref 0.30–0.70)

## 2021-04-28 LAB — MAGNESIUM: Magnesium: 1.7 mg/dL (ref 1.7–2.4)

## 2021-04-28 LAB — PHOSPHORUS: Phosphorus: 4.2 mg/dL (ref 2.5–4.6)

## 2021-04-28 MED ORDER — TRAVASOL 10 % IV SOLN
INTRAVENOUS | Status: AC
  Filled 2021-04-28: qty 508.8

## 2021-04-28 MED ORDER — GLUCERNA SHAKE PO LIQD
237.0000 mL | Freq: Three times a day (TID) | ORAL | Status: DC
  Administered 2021-04-28 – 2021-05-03 (×12): 237 mL via ORAL
  Filled 2021-04-28 (×4): qty 237

## 2021-04-28 MED ORDER — ACETAMINOPHEN 325 MG PO TABS
650.0000 mg | ORAL_TABLET | Freq: Four times a day (QID) | ORAL | Status: DC
  Administered 2021-04-28 – 2021-05-03 (×19): 650 mg via ORAL
  Filled 2021-04-28 (×19): qty 2

## 2021-04-28 MED ORDER — MAGNESIUM SULFATE 2 GM/50ML IV SOLN
2.0000 g | Freq: Once | INTRAVENOUS | Status: AC
  Administered 2021-04-28: 2 g via INTRAVENOUS
  Filled 2021-04-28: qty 50

## 2021-04-28 NOTE — Progress Notes (Signed)
ANTICOAGULATION CONSULT NOTE - Follow Up Consult  Pharmacy Consult for Heparin Indication: atrial fibrillation  Allergies  Allergen Reactions   Vicodin [Hydrocodone-Acetaminophen] Nausea And Vomiting    Patient Measurements: Height: 6' (182.9 cm) Weight: 81.6 kg (180 lb) IBW/kg (Calculated) : 77.6 Heparin Dosing Weight: 81.6 kg  Vital Signs: Temp: 98.3 F (36.8 C) (01/31 0746) Temp Source: Oral (01/31 0746) BP: 134/75 (01/31 0746) Pulse Rate: 70 (01/31 0746)  Labs: Recent Labs    04/26/21 0253 04/26/21 1500 04/27/21 0238 04/28/21 0354  HGB 8.5*  --  8.1* 8.7*  HCT 25.6*  --  25.0* 26.8*  PLT 143*  --  133* 132*  HEPARINUNFRC 0.52 0.35 0.48 0.44  CREATININE 0.92  --  0.88 0.95    Estimated Creatinine Clearance: 84 mL/min (by C-G formula based on SCr of 0.95 mg/dL).  Assessment: 67 yr old male with medical history significant for atrial fibrillation on Eliquis PTA, now on hold and s/p reversal w/ Kcentra for urgent ileocecectomy/ex-lap on 1/22. Pharmacy consulted to bridge with IV heparin until able to resume apixaban.    Heparin infusion held 1/27 due to bloody bowel movement. Hgb 9.4 > repeat - 9.8. Heparin resumed afternoon of 1/27 with modified low goal and no bolus per Dr. Waldron Labs.    Heparin level is in low therapeutic range (0.44) on 1250 units/hr.  Hgb down 8/1 on 1/30 and 1 units PRBCs given > 8.7 today.  Platelet count low stable.  BM 1/30 with no evidence of blood.  Goal of Therapy:  Heparin level 0.3-0.5 units/ml Monitor platelets by anticoagulation protocol: Yes   Plan:  Continue heparin drip at 1250 units/hr. Daily heparin level and CBC. Apixaban on hold.  Arty Baumgartner, RPh 04/28/2021,10:56 AM

## 2021-04-28 NOTE — Progress Notes (Signed)
Occupational Therapy Treatment Patient Details Name: [name] MRN: [id number] DOB: May 14, 1955 Today's Date: 04/28/2021   History of present illness 66 y/o male admitted 1/19 with weakness, increased urination and tremors.  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 PMH includes: anxiety, AKI, CAD, dementia, DKA, CVA, TIA.   OT comments  Patient making good progress with OT treatment. Patient able to bathe feet and doff and donn socks with supervision. Patient performed UB bathing and grooming tasks standing at sink with supervision and no LOB. Patient performed BUE exercises from HEP with occasional cue to perform correctly. Acute OT to continue to follow.    Recommendations for follow up therapy are one component of a multi-disciplinary discharge planning process, led by the attending physician.  Recommendations may be updated based on patient status, additional functional criteria and insurance authorization.    Follow Up Recommendations  Home health OT    Assistance Recommended at Discharge Frequent or constant Supervision/Assistance  Patient can return home with the following  Assistance with cooking/housework;Direct supervision/assist for medications management;Direct supervision/assist for financial management;Assist for transportation;Help with stairs or ramp for entrance;A little help with walking and/or transfers;A little help with bathing/dressing/bathroom   Equipment Recommendations  BSC/3in1;Other (comment)    Recommendations for Other Services      Precautions / Restrictions Precautions Precaution Comments: abdominal incision Restrictions Weight Bearing Restrictions: No       Mobility Bed Mobility Overal bed mobility: Needs Assistance Bed Mobility: Rolling, Sidelying to Sit Rolling: Modified independent (Device/Increase time) Sidelying to sit: Modified independent (Device/Increase time)       General  bed mobility comments: able to get to EOB without assistance    Transfers Overall transfer level: Needs assistance Equipment used: None Transfers: Sit to/from Stand Sit to Stand: Independent           General transfer comment: transferred to recliner without assistive device     Balance Overall balance assessment: No apparent balance deficits (not formally assessed) Sitting-balance support: No upper extremity supported, Feet supported Sitting balance-Leahy Scale: Good     Standing balance support: No upper extremity supported Standing balance-Leahy Scale: Good Standing balance comment: able to stand at sink for self care                           ADL either performed or assessed with clinical judgement   ADL Overall ADL's : Needs assistance/impaired     Grooming: Supervision/safety;Wash/dry hands;Wash/dry face;Oral care;Standing Grooming Details (indicate cue type and reason): performed standing at sink Upper Body Bathing: Supervision/ safety;Standing Upper Body Bathing Details (indicate cue type and reason): performed standing at sink Lower Body Bathing: Supervison/ safety;Sit to/from stand Lower Body Bathing Details (indicate cue type and reason): bathed legs and feet seated on EOB     Lower Body Dressing: Supervision/safety;Sitting/lateral leans Lower Body Dressing Details (indicate cue type and reason): doffed and donned socks seated on EOB               General ADL Comments: performed LB bathing and dressing seated and UB bathing and grooming standing at sink with improved balance and good safety    Extremity/Trunk Assessment              Vision       Perception     Praxis      Cognition Arousal/Alertness: Awake/alert Behavior During Therapy: WFL for tasks assessed/performed Overall Cognitive  Status: Within Functional Limits for tasks assessed                         Following Commands: Follows multi-step commands  consistently       General Comments: demonstrates good safety and oriented        Exercises Exercises: General Upper Extremity General Exercises - Upper Extremity Shoulder Flexion: Strengthening, Left, 10 reps, Supine, Theraband Theraband Level (Shoulder Flexion): Level 2 (Red) Shoulder ABduction: Left, 10 reps, Strengthening, Supine, Theraband Theraband Level (Shoulder Abduction): Level 2 (Red) Elbow Flexion: Strengthening, Left, 10 reps, Supine, Theraband Theraband Level (Elbow Flexion): Level 2 (Red) Elbow Extension: Strengthening, Left, 10 reps, Supine, Theraband Theraband Level (Elbow Extension): Level 2 (Red)    Shoulder Instructions       General Comments      Pertinent Vitals/ Pain       Pain Assessment Pain Assessment: Faces Faces Pain Scale: Hurts a little bit Pain Location: abdomen Pain Descriptors / Indicators: Discomfort, Guarding  Home Living                                          Prior Functioning/Environment              Frequency  Min 2X/week        Progress Toward Goals  OT Goals(current goals can now be found in the care plan section)  Progress towards OT goals: Progressing toward goals  Acute Rehab OT Goals Patient Stated Goal: go home OT Goal Formulation: With patient Time For Goal Achievement: 05/01/21 Potential to Achieve Goals: Good ADL Goals Pt Will Perform Grooming: with supervision;sitting;with set-up Pt Will Perform Upper Body Bathing: with set-up;sitting Pt Will Perform Lower Body Dressing: with min assist;sit to/from stand;sitting/lateral leans Pt Will Transfer to Toilet: with min guard assist;ambulating;bedside commode Pt Will Perform Toileting - Clothing Manipulation and hygiene: with min guard assist;sit to/from stand;sitting/lateral leans Pt/caregiver will Perform Home Exercise Program: Increased strength;Both right and left upper extremity;With written HEP provided Additional ADL Goal #1: Pt will  follow 2 step commands with increased time and 90% accuracy.  Plan Discharge plan remains appropriate    Co-evaluation                 AM-PAC OT "6 Clicks" Daily Activity     Outcome Measure   Help from another person eating meals?: A Lot Help from another person taking care of personal grooming?: A Little Help from another person toileting, which includes using toliet, bedpan, or urinal?: A Little Help from another person bathing (including washing, rinsing, drying)?: A Little Help from another person to put on and taking off regular upper body clothing?: A Little Help from another person to put on and taking off regular lower body clothing?: A Little 6 Click Score: 17    End of Session    OT Visit Diagnosis: Other abnormalities of gait and mobility (R26.89);Muscle weakness (generalized) (M62.81);Other symptoms and signs involving the nervous system (R29.898)   Activity Tolerance Patient tolerated treatment well   Patient Left in chair;with call bell/phone within reach;with chair alarm set   Nurse Communication Mobility status        Time: [phone] OT Time Calculation (min): 26 min  Charges: OT General Charges $OT Visit: 1 Visit OT Treatments $Self Care/Home Management : 8-22 mins $Therapeutic Exercise: 8-22 mins  Lodema Hong, OTA  Acute Rehabilitation Services  Pager [phone] Office [phone]   Trixie Dredge 04/28/2021, 9:58 AM

## 2021-04-28 NOTE — Progress Notes (Signed)
PROGRESS NOTE                                                                                                                                                                                                             Patient Demographics:    [name], is a 66 y.o. male, DOB - 1956/02/26, MOQ:[id number]  Outpatient Primary MD for the patient is [name], MD    LOS - 12  Admit date - 04/16/2021    Chief Complaint  Patient presents with   Hyperglycemia       Brief Narrative   66 y.o. male with medical history significant of CAD, HTN.  He presented to the ER with about 5 to 7-day history of feeling weak with increase in urination and thirst along with some tremors.  In the ER he was diagnosed with new onset DM type II with DKA and admitted to the hospital.  He was treated for DKA and stabilized he was about to be discharged on 04/19/2021 but right before being discharged he developed acute onset of abdominal pain and was found to have new ischemic bowel he underwent laparotomy with ileocecal resection and now recovering from the surgery. -Prolonged recovery, requiring TPN, and possible post operative ileus, improving, tolerating oral intake, tolerating anticoagulation at this point.   Subjective:   He denies any nausea, vomiting, reports abdominal pain has significantly improved, denies any blood with BM.   Assessment  & Plan :    No notes have been filed under this hospital service. Service: Hospitalist   New diagnosis of DM type II with DKA on presentation - with severe dehydration - AKI, pseudohyponatremia and metabolic acidosis due to DKA. -He was treated with DKA protocol along with aggressive IV fluids and electrolyte replacement, gap is closed he has been transitioned to Lantus and sliding scale, dose adjusted on 04/20/2021 as he is n.p.o. postop.  Continue to monitor and adjust closely. -Currently  controlled on 8 units of Lantus and insulin sliding scale.  He is on TPN, has been transitioned to added insulin to TPN.  Continue with sliding scale every 6 hours.  CBG (last 3)  Recent Labs    04/28/21 0357 04/28/21 0749 04/28/21 1134  GLUCAP 112* 201* 235*   Lab Results  Component Value Date   HGBA1C >15.5 (H) 04/19/2021  Ischemic bowel /embolic mesenteric ischemia - patient sudden onset of periumbilical abdominal pain on 04/19/2021.  Work-up suggestive of ischemic bowel, seen by vascular surgery and general surgery . -  s/p dx lap converted to ex lap with ileocecectomy for 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 Thermon Leyland 1/22 - Continue hydration, continue heparin drip.  He has plenty of risk factors which are PAD,CAD, Smoking, Afib - Heparin drip resumed on 04/20/2021 after discussions with general surgery.  On IV Zosyn. -Patient had bloody BM 1/26, likely old blood, H&H is stable, resumed back on TTE, especially he presents with embolic ischemic bowel.  Continue to monitor CBC closely. -Patient currently n.p.o., as evidence of postoperative SBO, so far no nausea and vomiting, so we will hold on NG tube, advancing diet per general surgery.   -Started on TPN on 1/27. -An episode of significant abdominal pain with bowel movement over the weekend, CT abdomen pelvis was obtained, with no acute findings, c. -Management per general surgery, he is currently tolerating full liquid diet, he is advanced to soft diet, I have discussed with general surgery, likely patient will be stable for discharge on Thursday as we will start weaning his TPN, advancing his diet, and likely staples will be removed before discharge  Chronic blood loss anemia -In the setting of GI surgery, and prolonged hospitalization, hemoglobin is 8.1 today, no evidence of significant GI bleed at this point, continue with heparin GTT, likely transition to Eliquis tomorrow as  long H&H is stable . -Required 1 unit PRBC transfusion 1/30.     Paroxysmal atrial fibrillation.  Continue beta-blocker and heparin instead of Eliquis in the perioperative period.  Mali vas 2 score of greater than 3. -Continue to monitor closely while on heparin GTT.  Hypertension.  Blood pressure is better on beta-blocker monitor add as needed hydralazine..  Dyslipidemia on statin and Vascepa.  AKI on CKD 3A with baseline creatinine of 1.5, AKI due to severe dehydration - holding ARB and HCTZ, has been aggressively hydrated with IV fluids and AKI seems to have resolved.  Hypokalemia -potassium is low at 3.3 today, repleted.  Hypomagnesemia.  Aggressively replaced.  H/O gout.  On allopurinol.  Encouraged the patient to sit up in chair in the daytime use I-S and flutter valve for pulmonary toiletry.  Will advance activity and titrate down oxygen as possible.  Thrombocytopenia -Improving, continue to monitor closely as on heparin GTT.      Condition - Fair  Family Communication  : None at bedside  Code Status :  Full  Consults  :  None  PUD Prophylaxis : PPI   Procedures  :     Exploratory laparotomy with Ileocecectomy - by [facility] 04/20/21   CT - 1. Mild circumferential wall thickening of the distal ileum with adjacent inflammation and small amount of free fluid. This is nonspecific but may represent an infectious or inflammatory enteritis. Given very heavy mesenteric arterial calcifications, ischemia is not entirely excluded but no evidence of pneumatosis intestinalis or pneumoperitoneum. Mildly distended loops of fluid-filled small bowel proximally without transition point, which may represent a mild ileus or low-grade partial obstruction at the distal ileum. 2. Mild bilateral LOWER lobe cylindrical bronchiectasis with minimal tree in bud opacities in the LEFT LOWER lobe, likely representing a mild infectious or inflammatory process. 3. Coronary artery disease and aortic  Atherosclerosis (ICD10-I70.0).       Disposition Plan  :    Status is: Inpatient  Remains inpatient appropriate because: DKA  DVT Prophylaxis  :  on heparin GTT  Place TED hose Start: 04/18/21 1101     Lab Results  Component Value Date   PLT 132 (L) 04/28/2021    Diet :  Diet Order             Diet full liquid Room service appropriate? Yes; Fluid consistency: Thin  Diet effective now                    Inpatient Medications  Scheduled Meds:  acetaminophen  650 mg Oral Q6H   allopurinol  100 mg Oral Daily   atorvastatin  80 mg Oral QHS   Chlorhexidine Gluconate Cloth  6 each Topical Daily   docusate sodium  100 mg Oral BID   escitalopram  20 mg Oral Daily   icosapent Ethyl  2 g Oral BID   insulin aspart  0-15 Units Subcutaneous Q4H   insulin glargine-yfgn  22 Units Subcutaneous Daily   insulin starter kit- pen needles  1 kit Other Once   living well with diabetes book   Does not apply Once   metoprolol tartrate  25 mg Oral BID   pantoprazole  40 mg Oral Daily   sodium chloride flush  10-40 mL Intracatheter Q12H   traZODone  100 mg Oral QHS   Continuous Infusions:  heparin 1,250 Units/hr (04/27/21 2230)   lactated ringers 10 mL/hr at 04/25/21 1800   methocarbamol (ROBAXIN) IV     TPN ADULT (ION) 85 mL/hr at 04/27/21 1736   TPN ADULT (ION)      PRN Meds:.busPIRone, dextrose, hydrALAZINE, HYDROmorphone (DILAUDID) injection, hydrOXYzine, methocarbamol (ROBAXIN) IV, metoprolol tartrate, ondansetron (ZOFRAN) IV, oxyCODONE, prochlorperazine, simethicone, sodium chloride flush  Antibiotics  :    Anti-infectives (From admission, onward)    Start     Dose/Rate Route Frequency Ordered Stop   04/20/21 0200  piperacillin-tazobactam (ZOSYN) IVPB 3.375 g  Status:  Discontinued        3.375 g 12.5 mL/hr over 240 Minutes Intravenous Every 8 hours 04/19/21 1649 04/20/21 0105   04/20/21 0200  piperacillin-tazobactam (ZOSYN) IVPB 3.375 g        3.375 g 12.5 mL/hr  over 240 Minutes Intravenous Every 8 hours 04/20/21 0105 04/24/21 0314   04/20/21 0000  piperacillin-tazobactam (ZOSYN) IVPB 3.375 g  Status:  Discontinued        3.375 g 100 mL/hr over 30 Minutes Intravenous Every 6 hours 04/19/21 2334 04/20/21 0105   04/19/21 1745  piperacillin-tazobactam (ZOSYN) IVPB 3.375 g        3.375 g 100 mL/hr over 30 Minutes Intravenous  Once 04/19/21 1649 04/19/21 1910      \   Emeline Gins Huck Ashworth M.D on 04/28/2021 at 11:46 AM  To page go to [email or web address]   [facility] -  Office  [phone]  See all Orders from today for further details    Objective:   Vitals:   04/28/21 0000 04/28/21 0350 04/28/21 0746 04/28/21 1132  BP: (!) 105/54 118/62 134/75 105/75  Pulse: 61 63 70 61  Resp: '17 16 16 14  ' Temp: 98 F (36.7 C) 98.1 F (36.7 C) 98.3 F (36.8 C) 97.9 F (36.6 C)  TempSrc: Oral Oral Oral Oral  SpO2: 93% 96% 95% 93%  Weight:      Height:        Wt Readings from Last 3 Encounters:  04/16/21 81.6 kg  03/12/21 85.3 kg  09/09/20 86.8 kg  Intake/Output Summary (Last 24 hours) at 04/28/2021 1146 Last data filed at 04/28/2021 0951 Gross per 24 hour  Intake 3335.09 ml  Output 2225 ml  Net 1110.09 ml     Physical Exam  Awake Alert, Oriented X 3, No new F.N deficits, Normal affect Symmetrical Chest wall movement, Good air movement bilaterally, CTAB RRR,No Gallops,Rubs or new Murmurs, No Parasternal Heave +ve B.Sounds, Abd Soft, surgical incisions with staples, No rebound - guarding or rigidity. No Cyanosis, Clubbing or edema, No new Rash or bruise        Data Review:    CBC Recent Labs  Lab 04/24/21 1224 04/25/21 0106 04/26/21 0253 04/27/21 0238 04/28/21 0354  WBC 7.1 8.1 8.9 7.6 7.3  HGB 9.8* 9.1* 8.5* 8.1* 8.7*  HCT 28.3* 27.0* 25.6* 25.0* 26.8*  PLT 131* 126* 143* 133* 132*  MCV 95.0 96.1 98.1 99.6 98.5  MCH 32.9 32.4 32.6 32.3 32.0  MCHC 34.6 33.7 33.2 32.4 32.5  RDW 13.3 13.2 13.2 13.6 15.4     Electrolytes Recent Labs  Lab 04/22/21 0225 04/23/21 0125 04/24/21 0127 04/25/21 0106 04/26/21 0253 04/27/21 0238 04/28/21 0354  NA 138 138 137 137 136 136 139  K 3.9 3.3* 3.6 3.7 3.4* 3.9 4.6  CL 106 108 106 108 105 108 110  CO2 '26 25 24 24 24 23 23  ' GLUCOSE 90 160* 165* 215* 208* 155* 128*  BUN 11 9 7* '8 8 9 12  ' CREATININE 1.22 1.17 1.01 0.98 0.92 0.88 0.95  CALCIUM 7.3* 7.2* 7.3* 7.2* 7.3* 7.6* 8.0*  AST '31 26 29 ' 36  --  22  --   ALT '23 19 23 30  ' --  20  --   ALKPHOS 80 67 83 77  --  75  --   BILITOT 0.4 0.4 0.4 0.4  --  0.2*  --   ALBUMIN 1.8* 1.7* 1.8* 1.8*  --  1.8*  --   MG 1.8 1.4* 1.6* 1.8 1.7 1.8 1.7  PROCALCITON 0.92 0.66 0.43  --   --   --   --     ------------------------------------------------------------------------------------------------------------------ Recent Labs    04/27/21 0238  TRIG 73    Lab Results  Component Value Date   HGBA1C >15.5 (H) 04/19/2021    No results for input(s): TSH, T4TOTAL, T3FREE, THYROIDAB in the last 72 hours.  Invalid input(s): FREET3  ------------------------------------------------------------------------------------------------------------------ ID Labs Recent Labs  Lab 04/22/21 0225 04/23/21 0125 04/24/21 0127 04/24/21 1224 04/25/21 0106 04/26/21 0253 04/27/21 0238 04/28/21 0354  WBC 7.3 6.0 6.8 7.1 8.1 8.9 7.6 7.3  PLT 111* 109* 132* 131* 126* 143* 133* 132*  PROCALCITON 0.92 0.66 0.43  --   --   --   --   --   CREATININE 1.22 1.17 1.01  --  0.98 0.92 0.88 0.95   Cardiac Enzymes No results for input(s): CKMB, TROPONINI, MYOGLOBIN in the last 168 hours.  Invalid input(s): CK  Radiology Reports CT ABDOMEN PELVIS W CONTRAST  Result Date: 04/26/2021 CLINICAL DATA:  Abdominal pain. Small-bowel obstruction. Recently postop from ileocecectomy for ischemic bowel. EXAM: CT ABDOMEN AND PELVIS WITH CONTRAST TECHNIQUE: Multidetector CT imaging of the abdomen and pelvis was performed using the standard  protocol following bolus administration of intravenous contrast. RADIATION DOSE REDUCTION: This exam was performed according to the departmental dose-optimization program which includes automated exposure control, adjustment of the mA and/or kV according to patient size and/or use of iterative reconstruction technique. CONTRAST:  120m OMNIPAQUE IOHEXOL 300 MG/ML  SOLN COMPARISON:  04/24/2021 FINDINGS: Lower Chest: No acute findings. Hepatobiliary: No hepatic masses identified. Gallbladder is unremarkable. No evidence of biliary ductal dilatation. Pancreas:  No mass or inflammatory changes. Spleen: Within normal limits in size and appearance. Adrenals/Urinary Tract: No masses identified. Stable small left renal cyst. No evidence of ureteral calculi or hydronephrosis. Stomach/Bowel: Mild wall thickening is again seen involving the ascending colon and ileocolonic anastomosis, without significant change since prior study. Mild to moderately dilated small bowel loops are again seen containing air-fluid levels, with transition point near the ileocolonic anastomosis. This is likely due to partial obstruction from postop edema. No other areas of bowel wall thickening are seen. No evidence of abscess or free intraperitoneal air. Vascular/Lymphatic: No pathologically enlarged lymph nodes. No acute vascular findings. Aortic atherosclerotic calcification noted. Reproductive:  No mass or other significant abnormality. Other: aMinimal ascites shows mild decrease since previous study. Musculoskeletal:  No suspicious bone lesions identified. IMPRESSION: Similar appearance of distal small bowel obstruction, with transition point near the ileocecal anastomosis. Wall thickening of the ileocolonic anastomosis is also similar in appearance, and likely represents postop edema. Minimal ascites. No evidence of abscess or free intraperitoneal air. Aortic Atherosclerosis (ICD10-I70.0). Electronically Signed   By: Marlaine Hind M.D.   On:  04/26/2021 16:13   DG Abd Portable 1V  Result Date: 04/25/2021 CLINICAL DATA:  Abdominal distension.  Post GI surgery. EXAM: PORTABLE ABDOMEN - 1 VIEW COMPARISON:  01/11/2016.  CT, 04/24/2021. FINDINGS: Multiple loops of distended small bowel are noted throughout the central abdomen with minimal air noted in the colon. Skin staples extend along the low anterior abdominal midline. Bowel anastomosis staples are noted in the right lower quadrant. IMPRESSION: 1. Multiple loops of dilated small bowel with minimal colonic air. Findings are consistent with a partial small bowel obstruction, moderate to high-grade. Findings are also similar to the previous day's CT scan. Electronically Signed   By: Lajean Manes M.D.   On: 04/25/2021 10:50     Patient ID: EVELYN MOCH, male   DOB: November 08, 1955, 66 y.o.   MRN: [id number] Patient ID: JUSTINO BOZE, male   DOB: 06-22-1955, 66 y.o.   MRN: [id number] Patient ID: SEBASTIANO LUECKE, male   DOB: Jan 25, 1956, 66 y.o.   MRN: [id number] Patient ID: PRUITT TABOADA, male   DOB: 1955/05/13, 66 y.o.   MRN: [id number] Patient ID: DELOYD HANDY, male   DOB: 1956-03-09, 66 y.o.   MRN: [id number] Patient ID: KAYSTON JODOIN, male   DOB: 1956/01/31, 66 y.o.   MRN: [id number] Patient ID: AHMON TOSI, male   DOB: 01-11-1956, 67 y.o.   MRN: [id number]

## 2021-04-28 NOTE — Progress Notes (Signed)
Nutrition Follow-up  DOCUMENTATION CODES:   Not applicable  INTERVENTION:   Glucerna Shake po TID, each supplement provides 220 kcal and 10 grams of protein TPN management per Pharmacy Recommend obtaining new weight.   NUTRITION DIAGNOSIS:   Inadequate oral intake related to poor appetite as evidenced by per patient/family report. - Ongoing   GOAL:   Patient will meet greater than or equal to 90% of their needs - Being addressed via TPN  MONITOR:   Diet advancement, Supplement acceptance, Skin, I & O's  REASON FOR ASSESSMENT:   LOS    ASSESSMENT:   66 y.o. male presented to the ED from PCP due to elevated CBG and Hgb A1c level. Pt had been experiencing polyuria, tremors, weakness, and polydipsia for a couple weeks. PMH includes HTN, TIA, and CAD. Pt admitted with DKA and new diagnosis of diabetes.   01/22 - Ex-lap for ischemic bowel s/p ileocecectomy 01/24 - diet advanced to clear liquids 01/25 - diet advanced to full liquids 01/26 - diet advanced to SOFT 01/27 - NPO; TPN started 01/30 - diet advanced to clear liquids 01/31 - diet advanced to full liquids  Pt developed a small bowel obstruction and surgery started the pt on TPN. Per Surgery note today, plan to wean TPN today.    Pt reports that he is feeling much better. Denies any nausea or vomiting. No intakes recorded within EMR.   Discussed with pt about adding Glucerna for pt to increase PO intake. Pt agreeable to ONS.  Pt reports that his tentative discharge is 2/2.   Medications reviewed and include: Colace, SSI 0-15 units q4h, Semglee, Protonix, IV magnesium sulfate, IV antibiotics Labs reviewed:  - 24 hr CBG 112-213  Diet Order:   Diet Order             Diet full liquid Room service appropriate? Yes; Fluid consistency: Thin  Diet effective now                   EDUCATION NEEDS:   No education needs have been identified at this time  Skin:  Skin Assessment: Reviewed RN Assessment  (Abdonminal Incision)  Last BM:  1/30  Height:   Ht Readings from Last 1 Encounters:  04/16/21 6' (1.829 m)    Weight:   Wt Readings from Last 1 Encounters:  04/16/21 81.6 kg    Ideal Body Weight:  80.9 kg  BMI:  Body mass index is 24.41 kg/m.  Estimated Nutritional Needs:   Kcal:  2100-2300  Protein:  105-120 grams  Fluid:  >/= 2.1 L    Lylliana Kitamura Louie Casa, RD, LDN Clinical Dietitian See [facility] for contact information.

## 2021-04-28 NOTE — Progress Notes (Signed)
PHARMACY - TOTAL PARENTERAL NUTRITION CONSULT NOTE   Indication: Small bowel obstruction  Patient Measurements: Height: 6' (182.9 cm) Weight: 81.6 kg (180 lb) IBW/kg (Calculated) : 77.6 TPN AdjBW (KG): 81.6 Body mass index is 24.41 kg/m. Usual Weight:    Assessment: Admitted with new-onset DM and DKA. Then had acute onset of abdominal pain and was found to have new ischemic bowel; underwent laparotomy with ileocecal resection  on 04/19/21.  New Bloody BM noted 1/27 overnight + abdominal pain with SBO on CT. Surgery feels pt may have ileus on exam.   Glucose / Insulin: DM on mod SSI, Semglee 22 units/d. CBGs improved - 112-201, with only 1 value >200 in last 24hrs (13 units SSI utilized). DKA on admission resolved Electrolytes: K 3.9>4.6, Mg 1.8>1.7 (s/p Mag sulfate 2g IV x 1 yesterday), Phos 3.2>4.2, others stable WNL -goal K>=4, Mg>=2 with ileus Renal: Scr <1 stable, BUN WNL Hepatic: Albumin 1.8, LFTs / Tbili / TG WNL Intake / Output; MIVF: MIVF: LR at [facility] LBM 1/30 - multiple overnight documented UOP 0.7 ml/kg/hr GI Imaging: - 1/22: CT: Wall thickening and adjacent inflammatory fat stranding of the cecal base and terminal ileum, involving approximately 20 cm of terminal ileum, consistent with nonspecific infectious or inflammatory terminal ileitis - 1/27: Abd CT: concerning for SBO - 1/29 CT Abd - similar appearing distal SBO, swelling at ileocecal anastomosis but no signs of abscess GI Surgeries / Procedures: - 1/22: acute onset of abdominal pain and was found to have new ischemic bowel he underwent laparotomy with ileocecal resection  Central access: PICC 1/27 TPN start date: 04/24/21  Nutritional Goals: Goal TPN rate is 85 mL/hr (provides 108 g of protein and 2105 kcals per day)  RD Assessment: Estimated Needs Total Energy Estimated Needs: 2100-2300 Total Protein Estimated Needs: 105-120 grams Total Fluid Estimated Needs: >/= 2.1 L  Current Nutrition:  Advance to FLD  1/31 TPN - begin weaning 1/31  Plan:  Begin weaning TPN today per Gen Surgery with bowel function returning and patient tolerating diet. Will wean to 1/2 goal rate 40 ml/hr at 1800 with plans to d/c TPN 2/1 if diet advancement tolerated. Electrolytes in TPN: Na 2mEq/L, decrease K to 59mEq/L, Ca 68mEq/L, increase Mg to 65mEq/L, and Phos 74mmol/L. Cl:Ac 1:1 (all lytes will decrease with rate decrease) Give Mag Sulfate 2g IV x 1 Add standard MVI and trace elements to TPN Continue moderate SSI q4h + Semglee 22 units daily + decrease regular insulin to 15 units added to TPN bag (with rate decrease). Monitor CBGs and adjust as needed Monitor TPN labs on Mon/Thurs and PRN F/u General Surgery plans   Arturo Morton, PharmD, BCPS Please check AMION for all [facility] contact numbers Clinical Pharmacist 04/28/2021 8:20 AM

## 2021-04-28 NOTE — Progress Notes (Signed)
9 Days Post-Op   Subjective/Chief Complaint: Patient comfortable, minimal soreness No nausea, vomiting, cramping yesterday A couple of bowel movements Tolerating clears   Objective: Vital signs in last 24 hours: Temp:  [97.8 F (36.6 C)-98.9 F (37.2 C)] 98.3 F (36.8 C) (01/31 0746) Pulse Rate:  [52-72] 70 (01/31 0746) Resp:  [15-20] 16 (01/31 0746) BP: (105-146)/(54-94) 134/75 (01/31 0746) SpO2:  [93 %-98 %] 95 % (01/31 0746) Last BM Date: 04/27/21  Intake/Output from previous day: 01/30 0701 - 01/31 0700 In: 3335.1 [I.V.:3026.8; Blood:308.3] Out: 1400 [Urine:1400] Intake/Output this shift: Total I/O In: -  Out: 825 [Urine:825]  Gen:  Alert, NAD, pleasant Abd: Obese. Abdomen is much softer than yesterday. Less distended Minimal tenderness around incision Midline wound with staples in place and no signs of infection.  Psych: A&Ox3  Skin: no rashes noted, warm and dry  Lab Results:  Recent Labs    04/27/21 0238 04/28/21 0354  WBC 7.6 7.3  HGB 8.1* 8.7*  HCT 25.0* 26.8*  PLT 133* 132*   BMET Recent Labs    04/27/21 0238 04/28/21 0354  NA 136 139  K 3.9 4.6  CL 108 110  CO2 23 23  GLUCOSE 155* 128*  BUN 9 12  CREATININE 0.88 0.95  CALCIUM 7.6* 8.0*   PT/INR No results for input(s): LABPROT, INR in the last 72 hours. ABG No results for input(s): PHART, HCO3 in the last 72 hours.  Invalid input(s): PCO2, PO2  Studies/Results: CT ABDOMEN PELVIS W CONTRAST  Result Date: 04/26/2021 CLINICAL DATA:  Abdominal pain. Small-bowel obstruction. Recently postop from ileocecectomy for ischemic bowel. EXAM: CT ABDOMEN AND PELVIS WITH CONTRAST TECHNIQUE: Multidetector CT imaging of the abdomen and pelvis was performed using the standard protocol following bolus administration of intravenous contrast. RADIATION DOSE REDUCTION: This exam was performed according to the departmental dose-optimization program which includes automated exposure control, adjustment of the  mA and/or kV according to patient size and/or use of iterative reconstruction technique. CONTRAST:  131mL OMNIPAQUE IOHEXOL 300 MG/ML  SOLN COMPARISON:  04/24/2021 FINDINGS: Lower Chest: No acute findings. Hepatobiliary: No hepatic masses identified. Gallbladder is unremarkable. No evidence of biliary ductal dilatation. Pancreas:  No mass or inflammatory changes. Spleen: Within normal limits in size and appearance. Adrenals/Urinary Tract: No masses identified. Stable small left renal cyst. No evidence of ureteral calculi or hydronephrosis. Stomach/Bowel: Mild wall thickening is again seen involving the ascending colon and ileocolonic anastomosis, without significant change since prior study. Mild to moderately dilated small bowel loops are again seen containing air-fluid levels, with transition point near the ileocolonic anastomosis. This is likely due to partial obstruction from postop edema. No other areas of bowel wall thickening are seen. No evidence of abscess or free intraperitoneal air. Vascular/Lymphatic: No pathologically enlarged lymph nodes. No acute vascular findings. Aortic atherosclerotic calcification noted. Reproductive:  No mass or other significant abnormality. Other: aMinimal ascites shows mild decrease since previous study. Musculoskeletal:  No suspicious bone lesions identified. IMPRESSION: Similar appearance of distal small bowel obstruction, with transition point near the ileocecal anastomosis. Wall thickening of the ileocolonic anastomosis is also similar in appearance, and likely represents postop edema. Minimal ascites. No evidence of abscess or free intraperitoneal air. Aortic Atherosclerosis (ICD10-I70.0). Electronically Signed   By: Marlaine Hind M.D.   On: 04/26/2021 16:13    Anti-infectives: Anti-infectives (From admission, onward)    Start     Dose/Rate Route Frequency Ordered Stop   04/20/21 0200  piperacillin-tazobactam (ZOSYN) IVPB 3.375 g  Status:  Discontinued        3.375  g 12.5 mL/hr over 240 Minutes Intravenous Every 8 hours 04/19/21 1649 04/20/21 0105   04/20/21 0200  piperacillin-tazobactam (ZOSYN) IVPB 3.375 g        3.375 g 12.5 mL/hr over 240 Minutes Intravenous Every 8 hours 04/20/21 0105 04/24/21 0314   04/20/21 0000  piperacillin-tazobactam (ZOSYN) IVPB 3.375 g  Status:  Discontinued        3.375 g 100 mL/hr over 30 Minutes Intravenous Every 6 hours 04/19/21 2334 04/20/21 0105   04/19/21 1745  piperacillin-tazobactam (ZOSYN) IVPB 3.375 g        3.375 g 100 mL/hr over 30 Minutes Intravenous  Once 04/19/21 1649 04/19/21 1910       Assessment/Plan: POD 9 s/p exploratory laparotomy with ileocecectomy for 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 Thermon Leyland 1/22 - Patient has resumed bowel function uring without tachycardia or hypotension. He remains afebrile with wbc wnl.  - Wean TPN off - Advance diet as tolerated -Will probably d/c staples prior to discharge - estimated discharge on 2/2.  No home health needs predicted.   FEN: FLD ADAT, TPN - wean off ID: zosyn 1/22 - 1/26 VTE: hep gtt 1/23 >> platelets 143   Per primary: T2DM with DKA AKI Afib on eliquis baseline HTN  LOS: 12 days    Maia Petties 04/28/2021

## 2021-04-29 ENCOUNTER — Inpatient Hospital Stay (HOSPITAL_COMMUNITY): Payer: Medicare Other

## 2021-04-29 DIAGNOSIS — I4891 Unspecified atrial fibrillation: Secondary | ICD-10-CM | POA: Diagnosis not present

## 2021-04-29 DIAGNOSIS — E1169 Type 2 diabetes mellitus with other specified complication: Secondary | ICD-10-CM

## 2021-04-29 DIAGNOSIS — D696 Thrombocytopenia, unspecified: Secondary | ICD-10-CM

## 2021-04-29 DIAGNOSIS — K559 Vascular disorder of intestine, unspecified: Secondary | ICD-10-CM | POA: Diagnosis not present

## 2021-04-29 DIAGNOSIS — K567 Ileus, unspecified: Secondary | ICD-10-CM | POA: Diagnosis not present

## 2021-04-29 DIAGNOSIS — K922 Gastrointestinal hemorrhage, unspecified: Secondary | ICD-10-CM | POA: Diagnosis not present

## 2021-04-29 DIAGNOSIS — D649 Anemia, unspecified: Secondary | ICD-10-CM

## 2021-04-29 DIAGNOSIS — M109 Gout, unspecified: Secondary | ICD-10-CM

## 2021-04-29 HISTORY — DX: Vascular disorder of intestine, unspecified: K55.9

## 2021-04-29 LAB — BASIC METABOLIC PANEL
Anion gap: 7 (ref 5–15)
BUN: 16 mg/dL (ref 8–23)
CO2: 23 mmol/L (ref 22–32)
Calcium: 8.2 mg/dL — ABNORMAL LOW (ref 8.9–10.3)
Chloride: 107 mmol/L (ref 98–111)
Creatinine, Ser: 1.18 mg/dL (ref 0.61–1.24)
GFR, Estimated: >60 mL/min (ref >60)
Glucose, Bld: 160 mg/dL — ABNORMAL HIGH (ref 70–99)
Potassium: 4.5 mmol/L (ref 3.5–5.1)
Sodium: 137 mmol/L (ref 135–145)

## 2021-04-29 LAB — CBC
HCT: 27.6 % — ABNORMAL LOW (ref 39.0–52.0)
HCT: 25.8 % — ABNORMAL LOW (ref 39.0–52.0)
HCT: 24.4 % — ABNORMAL LOW (ref 39.0–52.0)
Hemoglobin: 9 g/dL — ABNORMAL LOW (ref 13.0–17.0)
Hemoglobin: 8.4 g/dL — ABNORMAL LOW (ref 13.0–17.0)
Hemoglobin: 8 g/dL — ABNORMAL LOW (ref 13.0–17.0)
MCH: 32.4 pg (ref 26.0–34.0)
MCH: 32.6 pg (ref 26.0–34.0)
MCH: 32.9 pg (ref 26.0–34.0)
MCHC: 32.6 g/dL (ref 30.0–36.0)
MCHC: 32.6 g/dL (ref 30.0–36.0)
MCHC: 32.8 g/dL (ref 30.0–36.0)
MCV: 99.3 fL (ref 80.0–100.0)
MCV: 100 fL (ref 80.0–100.0)
MCV: 100.4 fL — ABNORMAL HIGH (ref 80.0–100.0)
Platelets: 145 10*3/uL — ABNORMAL LOW (ref 150–400)
Platelets: 136 10*3/uL — ABNORMAL LOW (ref 150–400)
Platelets: 132 10*3/uL — ABNORMAL LOW (ref 150–400)
RBC: 2.78 MIL/uL — ABNORMAL LOW (ref 4.22–5.81)
RBC: 2.58 MIL/uL — ABNORMAL LOW (ref 4.22–5.81)
RBC: 2.43 MIL/uL — ABNORMAL LOW (ref 4.22–5.81)
RDW: 15.6 % — ABNORMAL HIGH (ref 11.5–15.5)
RDW: 15.9 % — ABNORMAL HIGH (ref 11.5–15.5)
RDW: 15.9 % — ABNORMAL HIGH (ref 11.5–15.5)
WBC: 9.6 10*3/uL (ref 4.0–10.5)
WBC: 8.5 10*3/uL (ref 4.0–10.5)
WBC: 7.4 10*3/uL (ref 4.0–10.5)
nRBC: 0.4 % — ABNORMAL HIGH (ref 0.0–0.2)
nRBC: 0.9 % — ABNORMAL HIGH (ref 0.0–0.2)
nRBC: 0.7 % — ABNORMAL HIGH (ref 0.0–0.2)

## 2021-04-29 LAB — GLUCOSE, CAPILLARY
Glucose-Capillary: 112 mg/dL — ABNORMAL HIGH (ref 70–99)
Glucose-Capillary: 121 mg/dL — ABNORMAL HIGH (ref 70–99)
Glucose-Capillary: 140 mg/dL — ABNORMAL HIGH (ref 70–99)
Glucose-Capillary: 150 mg/dL — ABNORMAL HIGH (ref 70–99)
Glucose-Capillary: 158 mg/dL — ABNORMAL HIGH (ref 70–99)
Glucose-Capillary: 178 mg/dL — ABNORMAL HIGH (ref 70–99)
Glucose-Capillary: 181 mg/dL — ABNORMAL HIGH (ref 70–99)
Glucose-Capillary: 192 mg/dL — ABNORMAL HIGH (ref 70–99)
Glucose-Capillary: 206 mg/dL — ABNORMAL HIGH (ref 70–99)
Glucose-Capillary: 239 mg/dL — ABNORMAL HIGH (ref 70–99)

## 2021-04-29 LAB — ECHOCARDIOGRAM COMPLETE
AR max vel: 2.15 cm2
AV Area VTI: 2.36 cm2
AV Area mean vel: 2.14 cm2
AV Mean grad: 5 mmHg
AV Peak grad: 9.7 mmHg
Ao pk vel: 1.56 m/s
Area-P 1/2: 2.48 cm2
Calc EF: 55.3 %
Height: 72 in
S' Lateral: 2.3 cm
Single Plane A2C EF: 54.5 %
Single Plane A4C EF: 57.7 %
Weight: 2880 oz

## 2021-04-29 LAB — PHOSPHORUS: Phosphorus: 4.8 mg/dL — ABNORMAL HIGH (ref 2.5–4.6)

## 2021-04-29 LAB — HEPARIN LEVEL (UNFRACTIONATED): Heparin Unfractionated: 0.4 IU/mL (ref 0.30–0.70)

## 2021-04-29 LAB — MAGNESIUM: Magnesium: 1.5 mg/dL — ABNORMAL LOW (ref 1.7–2.4)

## 2021-04-29 MED ORDER — HYDROCORTISONE ACETATE 25 MG RE SUPP
25.0000 mg | Freq: Two times a day (BID) | RECTAL | Status: AC
  Administered 2021-04-29 – 2021-04-30 (×3): 25 mg via RECTAL
  Filled 2021-04-29 (×4): qty 1

## 2021-04-29 MED ORDER — HEPARIN (PORCINE) 25000 UT/250ML-% IV SOLN
1250.0000 [IU]/h | INTRAVENOUS | Status: DC
  Administered 2021-04-29 (×2): 1250 [IU]/h via INTRAVENOUS
  Filled 2021-04-29: qty 250

## 2021-04-29 MED ORDER — DEXTROSE IN LACTATED RINGERS 5 % IV SOLN: INTRAVENOUS | Status: DC

## 2021-04-29 MED ORDER — MAGNESIUM SULFATE 4 GM/100ML IV SOLN
4.0000 g | Freq: Once | INTRAVENOUS | Status: AC
  Administered 2021-04-29: 4 g via INTRAVENOUS
  Filled 2021-04-29: qty 100

## 2021-04-29 NOTE — Assessment & Plan Note (Signed)
DKA has resolved-CBGs stable on SQ insulin.

## 2021-04-29 NOTE — Assessment & Plan Note (Addendum)
Multifactorial-initially felt to be due to acute illness-but now due to acute blood loss in the setting of lower GI bleeding.  Hemoglobin stable after 1 unit of PRBC transfusion on 2/2.

## 2021-04-29 NOTE — Assessment & Plan Note (Signed)
Improving-watch closely.

## 2021-04-29 NOTE — Progress Notes (Signed)
ANTICOAGULATION CONSULT NOTE - Follow Up Consult  Pharmacy Consult for Heparin Indication: atrial fibrillation  Allergies  Allergen Reactions   Vicodin [Hydrocodone-Acetaminophen] Nausea And Vomiting    Patient Measurements: Height: 6' (182.9 cm) Weight: 81.6 kg (180 lb) IBW/kg (Calculated) : 77.6 Heparin Dosing Weight: 81.6 kg  Vital Signs: Temp: 97.8 F (36.6 C) (02/01 0750) Temp Source: Oral (02/01 0750) BP: 140/64 (02/01 0750) Pulse Rate: 73 (02/01 0421)  Labs: Recent Labs    04/27/21 0238 04/28/21 0354 04/29/21 0500 04/29/21 1300  HGB 8.1* 8.7* 9.0* 8.4*  HCT 25.0* 26.8* 27.6* 25.8*  PLT 133* 132* 145* 136*  HEPARINUNFRC 0.48 0.44 0.40  --   CREATININE 0.88 0.95 1.18  --      Estimated Creatinine Clearance: 67.6 mL/min (by C-G formula based on SCr of 1.18 mg/dL).  Assessment: 66 yr old male with medical history significant for atrial fibrillation on Eliquis PTA, now on hold and s/p reversal w/ Kcentra for urgent ileocecectomy/ex-lap on 1/22. Pharmacy consulted to bridge with IV heparin until able to resume apixaban.    Heparin infusion held 1/27 due to bloody bowel movement. Hgb 9.4 > repeat - 9.8. Heparin resumed afternoon of 1/27 with modified low goal and no bolus per Dr. Waldron Labs.    Heparin level remains in low therapeutic range (0.40) on 1250 units/hr.  Bloody BMs again today as noted, none reported since. Anusol HC BID x 2 days added.  Hgb 9.0 this am, repeat = 8.4.   Platelet count low stable.    Goal of Therapy:  Heparin level 0.3-0.5 units/ml Monitor platelets by anticoagulation protocol: Yes   Plan:  Continue heparin drip at 1250 units/hr. Daily heparin level and CBC. Continue to monitor for bloody stools. Apixaban on hold.  Arty Baumgartner, [place] 04/29/2021,2:24 PM

## 2021-04-29 NOTE — Progress Notes (Signed)
10 Days Post-Op   Subjective/Chief Complaint: Denies abd pain nausea or vomiting. Denies blood in his stool  Per RN patient had 2 bloody stools this AM that had bright red blood.    Objective: Vital signs in last 24 hours: Temp:  [97.7 F (36.5 C)-98.5 F (36.9 C)] 97.8 F (36.6 C) (02/01 0750) Pulse Rate:  [61-74] 73 (02/01 0421) Resp:  [13-20] 20 (02/01 0750) BP: (105-146)/(64-77) 140/64 (02/01 0750) SpO2:  [92 %-97 %] 97 % (02/01 0750) Last BM Date: 04/28/21  Intake/Output from previous day: 01/31 0701 - 02/01 0700 In: 1688.1 [I.V.:1688.1] Out: 2845 [Urine:2845] Intake/Output this shift: No intake/output data recorded.  Gen:  Alert, NAD, pleasant Abd: Obese. Abdomen soft.. Less distended Minimal tenderness around incision Midline wound with staples in place and no signs of infection.  Psych: A&Ox3  Skin: no rashes noted, warm and dry  Lab Results:  Recent Labs    04/28/21 0354 04/29/21 0500  WBC 7.3 9.6  HGB 8.7* 9.0*  HCT 26.8* 27.6*  PLT 132* 145*   BMET Recent Labs    04/28/21 0354 04/29/21 0500  NA 139 137  K 4.6 4.5  CL 110 107  CO2 23 23  GLUCOSE 128* 160*  BUN 12 16  CREATININE 0.95 1.18  CALCIUM 8.0* 8.2*   PT/INR No results for input(s): LABPROT, INR in the last 72 hours. ABG No results for input(s): PHART, HCO3 in the last 72 hours.  Invalid input(s): PCO2, PO2  Studies/Results: No results found.  Anti-infectives: Anti-infectives (From admission, onward)    Start     Dose/Rate Route Frequency Ordered Stop   04/20/21 0200  piperacillin-tazobactam (ZOSYN) IVPB 3.375 g  Status:  Discontinued        3.375 g 12.5 mL/hr over 240 Minutes Intravenous Every 8 hours 04/19/21 1649 04/20/21 0105   04/20/21 0200  piperacillin-tazobactam (ZOSYN) IVPB 3.375 g        3.375 g 12.5 mL/hr over 240 Minutes Intravenous Every 8 hours 04/20/21 0105 04/24/21 0314   04/20/21 0000  piperacillin-tazobactam (ZOSYN) IVPB 3.375 g  Status:  Discontinued         3.375 g 100 mL/hr over 30 Minutes Intravenous Every 6 hours 04/19/21 2334 04/20/21 0105   04/19/21 1745  piperacillin-tazobactam (ZOSYN) IVPB 3.375 g        3.375 g 100 mL/hr over 30 Minutes Intravenous  Once 04/19/21 1649 04/19/21 1910       Assessment/Plan: POD 10 s/p exploratory laparotomy with ileocecectomy for 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 Thermon Leyland 1/22 - Pt w/ 2 bloody BMs this AM, no tachycardia or hypotension - back off diet to clears, continue TPN - continue heparin and monitor, will stop if significant drop in hgb or shift in vitals. -Will probably d/c staples prior to discharge. No home health needs predicted.   FEN: CLD  ID: zosyn 1/22 - 1/26 VTE: hep gtt 1/23 >>    Per primary: T2DM with DKA AKI Afib on eliquis baseline HTN  LOS: 13 days    Jill Alexanders 04/29/2021

## 2021-04-29 NOTE — Assessment & Plan Note (Signed)
Continue statin and Vascepa

## 2021-04-29 NOTE — Progress Notes (Addendum)
PROGRESS NOTE        PATIENT DETAILS Name: [name] Age: 66 y.o. Sex: male Date of Birth: [birth year] Admit Date: 04/16/2021 Admitting Physician Thurnell Lose, MD [id number], Christean Grief, MD  Brief Summary: 66 year old male with history of HTN, CAD-who presented with symptomatic hyperglycemia-was found to have DKA-subsequently stabilized with IVF, IV insulin-subsequent hospital course complicated by sudden abdominal pain-found to have ischemic bowel and underwent laparotomy.  Post surgery-patient developed ileus and bright red blood per stools.  See below for further details.  Significant Hospital events: 1/19>> admit for DKA 1/22>> sudden abdominal pain--ischemia of small bowel.  Significant imaging studies: 1/22>> CT abdomen/pelvis: Distal ileum with adjacent inflammation/small amount of free fluid. 1/22>> CT angio abdomen: No aneurysm/dissection of abdominal aorta-moderate mixed calcific atherosclerosis without high-grade stenosis of branch vessels. 1/29>> CT abdomen/pelvis: Distal small bowel obstruction with transition point near the ileocecal anastomosis.  Subjective: 2 bloody BMs overnight.  No abdominal pain.  No vomiting.   Objective: Vitals: Blood pressure 140/64, pulse 73, temperature 97.8 F (36.6 C), temperature source Oral, resp. rate 20, height 6' (1.829 m), weight 81.6 kg, SpO2 97 %.   Exam: Gen Exam:Alert awake-not in any distress HEENT:atraumatic, normocephalic Chest: B/L clear to auscultation anteriorly CVS:S1S2 regular Abdomen:soft non tender, non distended Extremities:no edema Neurology: Non focal Skin: no rash  Pertinent Labs/Radiology: CBC Latest Ref Rng & Units 04/29/2021 04/28/2021 04/27/2021  WBC 4.0 - 10.5 K/uL 9.6 7.3 7.6  Hemoglobin 13.0 - 17.0 g/dL 9.0(L) 8.7(L) 8.1(L)  Hematocrit 39.0 - 52.0 % 27.6(L) 26.8(L) 25.0(L)  Platelets 150 - 400 K/uL 145(L) 132(L) 133(L)    Lab Results  Component Value Date   NA 137  04/29/2021   K 4.5 04/29/2021   CL 107 04/29/2021   CO2 23 04/29/2021      Assessment/Plan: * DKA (diabetic ketoacidosis) ([place])- (present on admission) DKA has resolved-CBGs stable on SQ insulin.  Newly diagnosed DM (diabetes mellitus), type 2 (A1c> 15.5 on 1/22) CBG stable-continue 22 units of Semglee daily and SSI.  Will need to be discharged home with insulin-we will plan to add metformin on discharge.   Recent Labs    04/29/21 0405 04/29/21 0557 04/29/21 0815  GLUCAP 150* 140* 158*    Ischemic segment of 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-s/p exploratory laparotomy on 1/22 General surgery following-had 2 bloody BMs earlier this morning-on TNA-diet backed off to clear liquids.  Postoperative ileus Resolving-having BMs-tolerating liquids.  Lower GI bleed Had 2 bloody BMs earlier this morning-unclear if this is old blood that is making its way out over fresh blood from anastomotic site bleeding/diverticulosis or hemorrhoids.  Discussed with general surgical team-for now cautiously continue with IV heparin as hemoglobin is stable.  Will place him on Anusol suppository for 2 days.  Follow CBC closely.  If bleeding continues if significant drop in hemoglobin-we will plan on discontinuing IV heparin.  Thrombocytopenia ([place]) Improving-watch closely.  Normocytic anemia Due to acute illness-watch closely-if significant drop in hemoglobin due to GI bleeding develops-May need transfusion.  Acute renal failure (ARF) ([place])- (present on admission) Resolved-AKI was likely hemodynamically mediated.  Follow electrolytes periodically.  Paroxysmal atrial fibrillation ([place])- (present on admission) Maintaining sinus rhythm-on beta-blocker-remains on IV heparin-see above.  Given embolic event to small bowel-check echo.  Hypertension- (present on admission) BP stable-continue beta-blocker.  Dyslipidemia- (present on admission)  Continue statin  and Vascepa  Hypomagnesemia Replete and recheck.  Gout Continue allopurinol.   Nutrition Status: Nutrition Problem: Inadequate oral intake Etiology: poor appetite Signs/Symptoms: per patient/family report Interventions: Glucerna shake, TPN   BMI: Estimated body mass index is 24.41 kg/m as calculated from the following:   Height as of this encounter: 6' (1.829 m).   Weight as of this encounter: 81.6 kg.    DVT Prophylaxis: IV Heparin Procedures: Exploratory laparotomy on 1/22 Consults: Vascular surgery, general surgery Code Status:Full code  Family Communication: None at bedside   Disposition Plan: Status is: Inpatient Remains inpatient appropriate because: Small bowel ischemia-s/p exploratory laparotomy-resolving ileus-now with bright red blood per rectum.  Not yet stable for discharge.  Planned Discharge Destination: Home   Diet: Diet Order             Diet clear liquid Room service appropriate? Yes; Fluid consistency: Thin  Diet effective now                     Antimicrobial agents: Anti-infectives (From admission, onward)    Start     Dose/Rate Route Frequency Ordered Stop   04/20/21 0200  piperacillin-tazobactam (ZOSYN) IVPB 3.375 g  Status:  Discontinued        3.375 g 12.5 mL/hr over 240 Minutes Intravenous Every 8 hours 04/19/21 1649 04/20/21 0105   04/20/21 0200  piperacillin-tazobactam (ZOSYN) IVPB 3.375 g        3.375 g 12.5 mL/hr over 240 Minutes Intravenous Every 8 hours 04/20/21 0105 04/24/21 0314   04/20/21 0000  piperacillin-tazobactam (ZOSYN) IVPB 3.375 g  Status:  Discontinued        3.375 g 100 mL/hr over 30 Minutes Intravenous Every 6 hours 04/19/21 2334 04/20/21 0105   04/19/21 1745  piperacillin-tazobactam (ZOSYN) IVPB 3.375 g        3.375 g 100 mL/hr over 30 Minutes Intravenous  Once 04/19/21 1649 04/19/21 1910        MEDICATIONS: Scheduled Meds:  acetaminophen  650 mg Oral Q6H   allopurinol  100 mg Oral Daily    atorvastatin  80 mg Oral QHS   Chlorhexidine Gluconate Cloth  6 each Topical Daily   docusate sodium  100 mg Oral BID   escitalopram  20 mg Oral Daily   feeding supplement (GLUCERNA SHAKE)  237 mL Oral TID BM   hydrocortisone  25 mg Rectal BID   icosapent Ethyl  2 g Oral BID   insulin aspart  0-15 Units Subcutaneous Q4H   insulin glargine-yfgn  22 Units Subcutaneous Daily   insulin starter kit- pen needles  1 kit Other Once   living well with diabetes book   Does not apply Once   metoprolol tartrate  25 mg Oral BID   pantoprazole  40 mg Oral Daily   sodium chloride flush  10-40 mL Intracatheter Q12H   traZODone  100 mg Oral QHS   Continuous Infusions:  dextrose 5% lactated ringers 50 mL/hr at 04/29/21 0504   heparin 1,250 Units/hr (04/29/21 0849)   lactated ringers 10 mL/hr at 04/28/21 1635   methocarbamol (ROBAXIN) IV     TPN ADULT (ION) 40 mL/hr at 04/28/21 1724   PRN Meds:.busPIRone, dextrose, hydrALAZINE, HYDROmorphone (DILAUDID) injection, hydrOXYzine, methocarbamol (ROBAXIN) IV, metoprolol tartrate, ondansetron (ZOFRAN) IV, oxyCODONE, prochlorperazine, simethicone, sodium chloride flush   I have personally reviewed following labs and imaging studies  LABORATORY DATA: CBC: Recent Labs  Lab 04/25/21 0106 04/26/21 0253 04/27/21 0238 04/28/21 0354 04/29/21  0500  WBC 8.1 8.9 7.6 7.3 9.6  HGB 9.1* 8.5* 8.1* 8.7* 9.0*  HCT 27.0* 25.6* 25.0* 26.8* 27.6*  MCV 96.1 98.1 99.6 98.5 99.3  PLT 126* 143* 133* 132* 145*    Basic Metabolic Panel: Recent Labs  Lab 04/25/21 0106 04/26/21 0253 04/27/21 0238 04/28/21 0354 04/29/21 0500  NA 137 136 136 139 137  K 3.7 3.4* 3.9 4.6 4.5  CL 108 105 108 110 107  CO2 '24 24 23 23 23  ' GLUCOSE 215* 208* 155* 128* 160*  BUN '8 8 9 12 16  ' CREATININE 0.98 0.92 0.88 0.95 1.18  CALCIUM 7.2* 7.3* 7.6* 8.0* 8.2*  MG 1.8 1.7 1.8 1.7 1.5*  PHOS 2.2* 2.7 3.2 4.2 4.8*    GFR: Estimated Creatinine Clearance: 67.6 mL/min (by C-G formula  based on SCr of 1.18 mg/dL).  Liver Function Tests: Recent Labs  Lab 04/23/21 0125 04/24/21 0127 04/25/21 0106 04/27/21 0238  AST 26 29 36 22  ALT '19 23 30 20  ' ALKPHOS 67 83 77 75  BILITOT 0.4 0.4 0.4 0.2*  PROT 4.3* 4.7* 4.3* 4.6*  ALBUMIN 1.7* 1.8* 1.8* 1.8*   No results for input(s): LIPASE, AMYLASE in the last 168 hours. No results for input(s): AMMONIA in the last 168 hours.  Coagulation Profile: No results for input(s): INR, PROTIME in the last 168 hours.  Cardiac Enzymes: No results for input(s): CKTOTAL, CKMB, CKMBINDEX, TROPONINI in the last 168 hours.  BNP (last 3 results) No results for input(s): PROBNP in the last 8760 hours.  Lipid Profile: Recent Labs    04/27/21 0238  TRIG 73    Thyroid Function Tests: No results for input(s): TSH, T4TOTAL, FREET4, T3FREE, THYROIDAB in the last 72 hours.  Anemia Panel: No results for input(s): VITAMINB12, FOLATE, FERRITIN, TIBC, IRON, RETICCTPCT in the last 72 hours.  Urine analysis:    Component Value Date/Time   COLORURINE STRAW (A) 04/16/2021 2030   APPEARANCEUR CLEAR 04/16/2021 2030   LABSPEC 1.021 04/16/2021 2030   PHURINE 5.0 04/16/2021 2030   GLUCOSEU >=500 (A) 04/16/2021 2030   HGBUR MODERATE (A) 04/16/2021 2030   BILIRUBINUR NEGATIVE 04/16/2021 2030   KETONESUR 5 (A) 04/16/2021 2030   PROTEINUR NEGATIVE 04/16/2021 2030   UROBILINOGEN 0.2 05/01/2008 1735   NITRITE NEGATIVE 04/16/2021 2030   LEUKOCYTESUR NEGATIVE 04/16/2021 2030    Sepsis Labs: Lactic Acid, Venous    Component Value Date/Time   LATICACIDVEN 1.2 04/19/2021 1943    MICROBIOLOGY: No results found for this or any previous visit (from the past 240 hour(s)).  RADIOLOGY STUDIES/RESULTS: No results found.   LOS: 13 days   Oren Binet, MD  [facility]    To contact the attending provider between 7A-7P or the covering provider during after hours 7P-7A, please log into the web site [email or web address] and access using  universal [place] password for that web site. If you do not have the password, please call the hospital operator.  04/29/2021, 10:31 AM

## 2021-04-29 NOTE — Assessment & Plan Note (Addendum)
BP stable-but creeping up-resume beta-blocker-add low-dose amlodipine on discharge.  Further optimization deferred to PCP.

## 2021-04-29 NOTE — Assessment & Plan Note (Signed)
Continue allopurinol 

## 2021-04-29 NOTE — Assessment & Plan Note (Addendum)
Maintaining sinus rhythm-on beta-blocker.  Anticoagulation held x2 days to allow GI bleeding to resolve-subsequently started heparin on 2/4-since no bleeding-transitioning to Eliquis on 2/5.

## 2021-04-29 NOTE — Assessment & Plan Note (Signed)
[name] was likely hemodynamically mediated.  Follow electrolytes periodically.

## 2021-04-29 NOTE — Assessment & Plan Note (Addendum)
General surgery following-no longer on TNA-diet being advanced-General surgery following.  Finally had a BM x2 earlier this morning.  General surgery will arrange for outpatient follow-up.

## 2021-04-29 NOTE — Assessment & Plan Note (Addendum)
Resolved-tolerating diet and having BMs.

## 2021-04-29 NOTE — Assessment & Plan Note (Addendum)
CBG stable-continue 22 units of Semglee daily on discharge, will add metformin on discharge.  PCP to optimize further. Recent Labs    05/03/21 0046 05/03/21 0444 05/03/21 0810  GLUCAP 90 106* 178*

## 2021-04-29 NOTE — Progress Notes (Signed)
° °  Echocardiogram 2D Echocardiogram has been performed.  [name] 04/29/2021, 1:38 PM

## 2021-04-29 NOTE — Hospital Course (Addendum)
65 year old male with history of HTN, CAD-who presented with symptomatic hyperglycemia-was found to have DKA-subsequently stabilized with IVF, IV insulin-subsequent hospital course complicated by sudden abdominal pain-found to have ischemic bowel and underwent laparotomy.  Post surgery-patient developed ileus and bright red blood per stools.  See below for further details.  Significant Hospital events: 1/19>> admit for DKA 1/22>> sudden abdominal pain--ischemia of small bowel. 2/01>> lower GI bleeding 2/02>> Heparin infusion discontinued 2/04>> no lower GI bleeding x2 days-IV heparin restarted  Significant imaging studies: 1/22>> CT abdomen/pelvis: Distal ileum with adjacent inflammation/small amount of free fluid. 1/22>> CT angio abdomen: No aneurysm/dissection of abdominal aorta-moderate mixed calcific atherosclerosis without high-grade stenosis of branch vessels. 1/29>> CT abdomen/pelvis: Distal small bowel obstruction with transition point near the ileocecal anastomosis. 2/01>> Echo: EF 55-60%

## 2021-04-29 NOTE — Progress Notes (Signed)
Pt. TPN became disconnected from patient's PICC line. IV team consulted. Per IV team, will need to obtain an order for dextrose containing IV fluids until a new bag of TPN will be available. Dr. Tonie Griffith paged for orders.

## 2021-04-29 NOTE — Assessment & Plan Note (Addendum)
Suspicion for slow bleeding at the staple/incision at the anastomotic site-no further lower GI bleeding since heparin drip was discontinued on 2/2.  Hemoglobin stable after 1 unit of PRBC transfusion on 2/2.  Heparin infusion was resumed on 2/4-patient had a BM without any bleeding on 2/5-will be transitioned to Eliquis

## 2021-04-29 NOTE — Assessment & Plan Note (Addendum)
Repleted. °

## 2021-04-29 NOTE — Progress Notes (Signed)
Occupational Therapy Treatment Patient Details Name: [name] MRN: [id number] DOB: [birth year] Today's Date: 04/29/2021   History of present illness 65 y/o male admitted 1/19 with weakness, increased urination and tremors.  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 PMH includes: anxiety, AKI, CAD, dementia, DKA, CVA, TIA.   OT comments  Patient received in bed and agreeable to OT session. Patient performed bathing with CHG wipes and changed gowns while seated/standing from EOB.  Patient performed mobility in room and hallway without an assistive device and required assistance due to equipment. Acute OT to continue to follow.    Recommendations for follow up therapy are one component of a multi-disciplinary discharge planning process, led by the attending physician.  Recommendations may be updated based on patient status, additional functional criteria and insurance authorization.    Follow Up Recommendations  Home health OT    Assistance Recommended at Discharge Frequent or constant Supervision/Assistance  Patient can return home with the following  Assistance with cooking/housework;Direct supervision/assist for medications management;Direct supervision/assist for financial management;Assist for transportation;Help with stairs or ramp for entrance;A little help with walking and/or transfers;A little help with bathing/dressing/bathroom   Equipment Recommendations  BSC/3in1;Other (comment)    Recommendations for Other Services      Precautions / Restrictions Precautions Precaution Comments: abdominal incision Restrictions Weight Bearing Restrictions: No       Mobility Bed Mobility Overal bed mobility: Modified Independent             General bed mobility comments: able to get to EOB without assistance    Transfers Overall transfer level: Needs assistance Equipment used: None Transfers: Sit to/from Stand Sit to  Stand: Independent           General transfer comment: min guard for safety with mobilty and transfers due to lines     Balance Overall balance assessment: No apparent balance deficits (not formally assessed) Sitting-balance support: No upper extremity supported, Feet supported Sitting balance-Leahy Scale: Good     Standing balance support: No upper extremity supported Standing balance-Leahy Scale: Good Standing balance comment: able to stand wihout an assitive device without LOB                           ADL either performed or assessed with clinical judgement   ADL Overall ADL's : Needs assistance/impaired         Upper Body Bathing: Supervision/ safety;Standing   Lower Body Bathing: Supervison/ safety;Sit to/from stand Lower Body Bathing Details (indicate cue type and reason): bathed legs and feet seated on EOB Upper Body Dressing : Minimal assistance Upper Body Dressing Details (indicate cue type and reason): min assist due to lines                 Functional mobility during ADLs: Min guard General ADL Comments: bathed with wipes from EOB    Extremity/Trunk Assessment              Vision       Perception     Praxis      Cognition Arousal/Alertness: Awake/alert Behavior During Therapy: WFL for tasks assessed/performed Overall Cognitive Status: Within Functional Limits for tasks assessed                         Following Commands: Follows multi-step commands consistently       General Comments: demonstrates good  safety and oriented        Exercises      Shoulder Instructions       General Comments      Pertinent Vitals/ Pain       Pain Assessment Pain Assessment: Faces Faces Pain Scale: Hurts a little bit Pain Location: abdomen Pain Descriptors / Indicators: Discomfort, Guarding Pain Intervention(s): Monitored during session, Limited activity within patient's tolerance  Home Living                                           Prior Functioning/Environment              Frequency  Min 2X/week        Progress Toward Goals  OT Goals(current goals can now be found in the care plan section)  Progress towards OT goals: Progressing toward goals  Acute Rehab OT Goals Patient Stated Goal: go home OT Goal Formulation: With patient Time For Goal Achievement: 05/01/21 Potential to Achieve Goals: Good ADL Goals Pt Will Perform Grooming: with supervision;sitting;with set-up Pt Will Perform Upper Body Bathing: with set-up;sitting Pt Will Perform Lower Body Dressing: with min assist;sit to/from stand;sitting/lateral leans Pt Will Transfer to Toilet: with min guard assist;ambulating;bedside commode Pt Will Perform Toileting - Clothing Manipulation and hygiene: with min guard assist;sit to/from stand;sitting/lateral leans Pt/caregiver will Perform Home Exercise Program: Increased strength;Both right and left upper extremity;With written HEP provided Additional ADL Goal #1: Pt will follow 2 step commands with increased time and 90% accuracy.  Plan Discharge plan remains appropriate    Co-evaluation                 AM-PAC OT "6 Clicks" Daily Activity     Outcome Measure   Help from another person eating meals?: A Lot Help from another person taking care of personal grooming?: A Little Help from another person toileting, which includes using toliet, bedpan, or urinal?: A Little Help from another person bathing (including washing, rinsing, drying)?: A Little Help from another person to put on and taking off regular upper body clothing?: A Little Help from another person to put on and taking off regular lower body clothing?: A Little 6 Click Score: 17    End of Session Equipment Utilized During Treatment: Gait belt  OT Visit Diagnosis: Other abnormalities of gait and mobility (R26.89);Muscle weakness (generalized) (M62.81);Other symptoms and signs involving the nervous  system (R29.898)   Activity Tolerance Patient tolerated treatment well   Patient Left in chair;with call bell/phone within reach;with chair alarm set;with family/visitor present   Nurse Communication Mobility status        Time: 3474-2595 OT Time Calculation (min): 24 min  Charges: OT General Charges $OT Visit: 1 Visit OT Treatments $Self Care/Home Management : 23-37 mins  Lodema Hong, [place]  Pager [phone] Office [place] 04/29/2021, 2:40 PM

## 2021-04-30 LAB — COMPREHENSIVE METABOLIC PANEL
ALT: 23 U/L (ref 0–44)
AST: 25 U/L (ref 15–41)
Albumin: 1.9 g/dL — ABNORMAL LOW (ref 3.5–5.0)
Alkaline Phosphatase: 72 U/L (ref 38–126)
Anion gap: 7 (ref 5–15)
BUN: 9 mg/dL (ref 8–23)
CO2: 24 mmol/L (ref 22–32)
Calcium: 7.9 mg/dL — ABNORMAL LOW (ref 8.9–10.3)
Chloride: 107 mmol/L (ref 98–111)
Creatinine, Ser: 1.01 mg/dL (ref 0.61–1.24)
GFR, Estimated: >60 mL/min (ref >60)
Glucose, Bld: 132 mg/dL — ABNORMAL HIGH (ref 70–99)
Potassium: 4.3 mmol/L (ref 3.5–5.1)
Sodium: 138 mmol/L (ref 135–145)
Total Bilirubin: 0.4 mg/dL (ref 0.3–1.2)
Total Protein: 4.4 g/dL — ABNORMAL LOW (ref 6.5–8.1)

## 2021-04-30 LAB — CBC
HCT: 23.1 % — ABNORMAL LOW (ref 39.0–52.0)
HCT: 29.2 % — ABNORMAL LOW (ref 39.0–52.0)
Hemoglobin: 7.5 g/dL — ABNORMAL LOW (ref 13.0–17.0)
Hemoglobin: 9.8 g/dL — ABNORMAL LOW (ref 13.0–17.0)
MCH: 33 pg (ref 26.0–34.0)
MCH: 32.7 pg (ref 26.0–34.0)
MCHC: 32.5 g/dL (ref 30.0–36.0)
MCHC: 33.6 g/dL (ref 30.0–36.0)
MCV: 101.8 fL — ABNORMAL HIGH (ref 80.0–100.0)
MCV: 97.3 fL (ref 80.0–100.0)
Platelets: 137 10*3/uL — ABNORMAL LOW (ref 150–400)
Platelets: 151 10*3/uL (ref 150–400)
RBC: 2.27 MIL/uL — ABNORMAL LOW (ref 4.22–5.81)
RBC: 3 MIL/uL — ABNORMAL LOW (ref 4.22–5.81)
RDW: 16 % — ABNORMAL HIGH (ref 11.5–15.5)
RDW: 17.4 % — ABNORMAL HIGH (ref 11.5–15.5)
WBC: 7 10*3/uL (ref 4.0–10.5)
WBC: 7.4 10*3/uL (ref 4.0–10.5)
nRBC: 0.9 % — ABNORMAL HIGH (ref 0.0–0.2)
nRBC: 0.7 % — ABNORMAL HIGH (ref 0.0–0.2)

## 2021-04-30 LAB — HEPARIN LEVEL (UNFRACTIONATED): Heparin Unfractionated: 0.49 IU/mL (ref 0.30–0.70)

## 2021-04-30 LAB — GLUCOSE, CAPILLARY
Glucose-Capillary: 109 mg/dL — ABNORMAL HIGH (ref 70–99)
Glucose-Capillary: 111 mg/dL — ABNORMAL HIGH (ref 70–99)
Glucose-Capillary: 116 mg/dL — ABNORMAL HIGH (ref 70–99)
Glucose-Capillary: 137 mg/dL — ABNORMAL HIGH (ref 70–99)
Glucose-Capillary: 167 mg/dL — ABNORMAL HIGH (ref 70–99)
Glucose-Capillary: 200 mg/dL — ABNORMAL HIGH (ref 70–99)

## 2021-04-30 LAB — PREPARE RBC (CROSSMATCH)

## 2021-04-30 LAB — MAGNESIUM: Magnesium: 1.9 mg/dL (ref 1.7–2.4)

## 2021-04-30 MED ORDER — DIPHENHYDRAMINE HCL 50 MG/ML IJ SOLN
25.0000 mg | Freq: Once | INTRAMUSCULAR | Status: AC
  Administered 2021-04-30: 25 mg via INTRAVENOUS
  Filled 2021-04-30: qty 1

## 2021-04-30 MED ORDER — ACETAMINOPHEN 325 MG PO TABS
650.0000 mg | ORAL_TABLET | Freq: Once | ORAL | Status: AC
  Administered 2021-04-30: 650 mg via ORAL
  Filled 2021-04-30: qty 2

## 2021-04-30 MED ORDER — FUROSEMIDE 10 MG/ML IJ SOLN
20.0000 mg | Freq: Once | INTRAMUSCULAR | Status: AC
  Administered 2021-04-30: 20 mg via INTRAVENOUS
  Filled 2021-04-30: qty 2

## 2021-04-30 MED ORDER — SODIUM CHLORIDE 0.9% IV SOLUTION: Freq: Once | INTRAVENOUS | Status: AC

## 2021-04-30 NOTE — Progress Notes (Signed)
ANTICOAGULATION CONSULT NOTE - Follow Up Consult  Pharmacy Consult for Heparin Indication: atrial fibrillation  Allergies  Allergen Reactions   Vicodin [Hydrocodone-Acetaminophen] Nausea And Vomiting    Patient Measurements: Height: 6' (182.9 cm) Weight: 81.6 kg (180 lb) IBW/kg (Calculated) : 77.6 Heparin Dosing Weight:  77.6 kg  Vital Signs: Temp: 98 F (36.7 C) (02/02 0750) Temp Source: Oral (02/02 0750) BP: 118/66 (02/02 0750) Pulse Rate: 57 (02/02 0750)  Labs: Recent Labs    04/28/21 0354 04/29/21 0500 04/29/21 1300 04/29/21 1951 04/30/21 0415  HGB 8.7* 9.0* 8.4* 8.0* 7.5*  HCT 26.8* 27.6* 25.8* 24.4* 23.1*  PLT 132* 145* 136* 132* 137*  HEPARINUNFRC 0.44 0.40  --   --  0.49  CREATININE 0.95 1.18  --   --  1.01    Estimated Creatinine Clearance: 79 mL/min (by C-G formula based on SCr of 1.01 mg/dL).   Assessment: Anticoag: Heparin for afib. (PTA Apixaban 5 mg on hold), LD 1/22 AM) - 01/22 s/p KCentra ~50 units/kg x 1 for urgent OR 1/22.  - Bloody BM 1/27 > heparin held, then resumed - HL 0.49 on 1250/hr, bloody BMs again, Hgb 9.0>7.5, Plts 137 low but stable. Transfuse today  Goal of Therapy:  Heparin level 0.3-0.5 units/ml Monitor platelets by anticoagulation protocol: Yes   Plan:  Continue heparin drip at 1250 units/hr Lower goal 0.3-0.5 with bloody BM Daily HL and CBC.   Khadir Roam S. Alford Highland, PharmD, BCPS Clinical Staff Pharmacist [email or web address]  Alford Highland, Laiken Nohr [place] 04/30/2021,8:21 AM

## 2021-04-30 NOTE — Progress Notes (Signed)
11 Days Post-Op   Subjective/Chief Complaint: Denies abd pain nausea or vomiting. Denies blood in his stool  Per RN patient had 2 bloody stools this AM that had bright red blood.    Objective: Vital signs in last 24 hours: Temp:  [97.4 F (36.3 C)-98.2 F (36.8 C)] 98 F (36.7 C) (02/02 0750) Pulse Rate:  [57-62] 57 (02/02 0750) Resp:  [13-18] 13 (02/02 0750) BP: (100-135)/(52-66) 118/66 (02/02 0750) SpO2:  [93 %-98 %] 95 % (02/02 0750) Last BM Date: 04/29/21  Intake/Output from previous day: 02/01 0701 - 02/02 0700 In: 1677.5 [P.O.:960; I.V.:717.5] Out: 2901 [Urine:2901] Intake/Output this shift: No intake/output data recorded.  Gen:  Alert, NAD, pleasant Abd: Obese. Abdomen soft.. Less distended Minimal tenderness around incision Midline wound with staples in place and no signs of infection.  Psych: A&Ox3  Skin: no rashes noted, warm and dry  Lab Results:  Recent Labs    04/29/21 1951 04/30/21 0415  WBC 7.4 7.0  HGB 8.0* 7.5*  HCT 24.4* 23.1*  PLT 132* 137*   BMET Recent Labs    04/29/21 0500 04/30/21 0415  NA 137 138  K 4.5 4.3  CL 107 107  CO2 23 24  GLUCOSE 160* 132*  BUN 16 9  CREATININE 1.18 1.01  CALCIUM 8.2* 7.9*   PT/INR No results for input(s): LABPROT, INR in the last 72 hours. ABG No results for input(s): PHART, HCO3 in the last 72 hours.  Invalid input(s): PCO2, PO2  Studies/Results: ECHOCARDIOGRAM COMPLETE  Result Date: 04/29/2021    ECHOCARDIOGRAM REPORT   Patient Name:   [name] Date of Exam: 04/29/2021 Medical Rec #:  [id number]     Height:       72.0 in Accession #:    [id number]    Weight:       180.0 lb Date of Birth:  [birth year]    BSA:          2.037 m Patient Age:    66 years      BP:           140/61 mmHg Patient Gender: M             HR:           63 bpm. Exam Location:  Inpatient Procedure: 2D Echo, Cardiac Doppler and Color Doppler Indications:    I48.91 AFIB  History:        Patient has prior history of Echocardiogram  examinations, most                 recent 09/23/2020. Previous Myocardial Infarction and CAD, Stroke                 and TIA; Risk Factors:Hypertension and Dyslipidemia. DKA /                 DYSRHYTHMIA / PAD.  Sonographer:    Beryle Beams Referring Phys: [place]  1. Left ventricular ejection fraction, by estimation, is 55 to 60%. The left ventricle has normal function. The left ventricle has no regional wall motion abnormalities. Left ventricular diastolic parameters are consistent with Grade I diastolic dysfunction (impaired relaxation).  2. Right ventricular systolic function is normal. The right ventricular size is normal.  3. The mitral valve is normal in structure. Trivial mitral valve regurgitation. No evidence of mitral stenosis.  4. The aortic valve is normal in structure. Aortic valve regurgitation is not visualized. Aortic valve sclerosis is present, with  no evidence of aortic valve stenosis.  5. The inferior vena cava is normal in size with greater than 50% respiratory variability, suggesting right atrial pressure of 3 mmHg. FINDINGS  Left Ventricle: Left ventricular ejection fraction, by estimation, is 55 to 60%. The left ventricle has normal function. The left ventricle has no regional wall motion abnormalities. The left ventricular internal cavity size was normal in size. There is  no left ventricular hypertrophy. Left ventricular diastolic parameters are consistent with Grade I diastolic dysfunction (impaired relaxation). Right Ventricle: The right ventricular size is normal. No increase in right ventricular wall thickness. Right ventricular systolic function is normal. Left Atrium: Left atrial size was normal in size. Right Atrium: Right atrial size was normal in size. Pericardium: There is no evidence of pericardial effusion. Presence of epicardial fat layer. Mitral Valve: The mitral valve is normal in structure. Trivial mitral valve regurgitation. No evidence of mitral  valve stenosis. Tricuspid Valve: The tricuspid valve is normal in structure. Tricuspid valve regurgitation is not demonstrated. No evidence of tricuspid stenosis. Aortic Valve: The aortic valve is normal in structure. Aortic valve regurgitation is not visualized. Aortic valve sclerosis is present, with no evidence of aortic valve stenosis. Aortic valve mean gradient measures 5.0 mmHg. Aortic valve peak gradient measures 9.7 mmHg. Aortic valve area, by VTI measures 2.36 cm. Pulmonic Valve: The pulmonic valve was normal in structure. Pulmonic valve regurgitation is not visualized. No evidence of pulmonic stenosis. Aorta: The aortic root is normal in size and structure. Venous: The inferior vena cava is normal in size with greater than 50% respiratory variability, suggesting right atrial pressure of 3 mmHg. IAS/Shunts: No atrial level shunt detected by color flow Doppler.  LEFT VENTRICLE PLAX 2D LVIDd:         5.00 cm     Diastology LVIDs:         2.30 cm     LV e' medial:    7.94 cm/s LV PW:         1.30 cm     LV E/e' medial:  12.5 LV IVS:        1.00 cm     LV e' lateral:   9.36 cm/s LVOT diam:     2.10 cm     LV E/e' lateral: 10.6 LV SV:         73 LV SV Index:   36 LVOT Area:     3.46 cm  LV Volumes (MOD) LV vol d, MOD A2C: 88.2 ml LV vol d, MOD A4C: 91.0 ml LV vol s, MOD A2C: 40.1 ml LV vol s, MOD A4C: 38.5 ml LV SV MOD A2C:     48.1 ml LV SV MOD A4C:     91.0 ml LV SV MOD BP:      49.4 ml RIGHT VENTRICLE             IVC RV S prime:     14.60 cm/s  IVC diam: 1.90 cm TAPSE (M-mode): 2.8 cm LEFT ATRIUM             Index        RIGHT ATRIUM           Index LA diam:        3.60 cm 1.77 cm/m   RA Area:     15.70 cm LA Vol (A2C):   43.4 ml 21.30 ml/m  RA Volume:   42.30 ml  20.76 ml/m LA Vol (A4C):   46.7 ml 22.92  ml/m LA Biplane Vol: 45.5 ml 22.33 ml/m  AORTIC VALVE                     PULMONIC VALVE AV Area (Vmax):    2.15 cm      PV Vmax:       0.74 m/s AV Area (Vmean):   2.14 cm      PV Vmean:      46.000  cm/s AV Area (VTI):     2.36 cm      PV VTI:        0.133 m AV Vmax:           156.00 cm/s   PV Peak grad:  2.2 mmHg AV Vmean:          103.000 cm/s  PV Mean grad:  1.0 mmHg AV VTI:            0.311 m AV Peak Grad:      9.7 mmHg AV Mean Grad:      5.0 mmHg LVOT Vmax:         96.70 cm/s LVOT Vmean:        63.700 cm/s LVOT VTI:          0.212 m LVOT/AV VTI ratio: 0.68  AORTA Ao Root diam: 3.10 cm Ao Asc diam:  2.90 cm MITRAL VALVE                TRICUSPID VALVE MV Area (PHT): 2.48 cm     TV Peak grad:   23.4 mmHg MV Decel Time: 306 msec     TV Mean grad:   17.0 mmHg MV E velocity: 99.20 cm/s   TV Vmax:        2.42 m/s MV A velocity: 122.00 cm/s  TV Vmean:       198.0 cm/s MV E/A ratio:  0.81         TV VTI:         0.78 msec                              SHUNTS                             Systemic VTI:  0.21 m                             Systemic Diam: 2.10 cm Kardie Tobb DO Electronically signed by Berniece Salines DO Signature Date/Time: 04/29/2021/6:00:58 PM    Final     Anti-infectives: Anti-infectives (From admission, onward)    Start     Dose/Rate Route Frequency Ordered Stop   04/20/21 0200  piperacillin-tazobactam (ZOSYN) IVPB 3.375 g  Status:  Discontinued        3.375 g 12.5 mL/hr over 240 Minutes Intravenous Every 8 hours 04/19/21 1649 04/20/21 0105   04/20/21 0200  piperacillin-tazobactam (ZOSYN) IVPB 3.375 g        3.375 g 12.5 mL/hr over 240 Minutes Intravenous Every 8 hours 04/20/21 0105 04/24/21 0314   04/20/21 0000  piperacillin-tazobactam (ZOSYN) IVPB 3.375 g  Status:  Discontinued        3.375 g 100 mL/hr over 30 Minutes Intravenous Every 6 hours 04/19/21 2334 04/20/21 0105   04/19/21 1745  piperacillin-tazobactam (ZOSYN) IVPB 3.375 g        3.375 g  100 mL/hr over 30 Minutes Intravenous  Once 04/19/21 1649 04/19/21 1910       Assessment/Plan: POD 11 s/p exploratory laparotomy with ileocecectomy for 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 Thermon Leyland 1/22 - Pt w/ 3 bloody BMs yesterday, no tachycardia or hypotension, hgb 8.5 > 8.0 > 7.5  - getting 1 u pRBC per medicine. Heparin being held this AM.  - SOFT diet.  -Will plan to d/c staples prior to discharge. No home health needs predicted.   FEN: SOFT.   ID: [email or web address] 1/22 - 1/26 VTE: hep gtt 1/23 >>    Per primary: T2DM with DKA AKI Afib on eliquis baseline HTN  LOS: 14 days    Jill Alexanders 04/30/2021

## 2021-04-30 NOTE — Progress Notes (Signed)
PROGRESS NOTE        PATIENT DETAILS Name: [name] Age: 66 y.o. Sex: male Date of Birth: September 07, 1955 Admit Date: 04/16/2021 Admitting Physician Thurnell Lose, MD ONG:[id number], Christean Grief, MD  Brief Summary: 66 year old male with history of HTN, CAD-who presented with symptomatic hyperglycemia-was found to have DKA-subsequently stabilized with IVF, IV insulin-subsequent hospital course complicated by sudden abdominal pain-found to have ischemic bowel and underwent laparotomy.  Post surgery-patient developed ileus and bright red blood per stools.  See below for further details.  Significant Hospital events: 1/19>> admit for DKA 1/22>> sudden abdominal pain--ischemia of small bowel. 2/01>> lower GI bleeding  Significant imaging studies: 1/22>> CT abdomen/pelvis: Distal ileum with adjacent inflammation/small amount of free fluid. 1/22>> CT angio abdomen: No aneurysm/dissection of abdominal aorta-moderate mixed calcific atherosclerosis without high-grade stenosis of branch vessels. 1/29>> CT abdomen/pelvis: Distal small bowel obstruction with transition point near the ileocecal anastomosis. 2/01>> Echo: EF 55-60%  Subjective: 3 bloody BMs yesterday-none today.  Last bloody BM was yesterday evening.  None since then.  Lying comfortably in bed-no abdominal pain.   Objective: Vitals: Blood pressure 121/66, pulse 66, temperature 98.5 F (36.9 C), temperature source Oral, resp. rate 20, height 6' (1.829 m), weight 81.6 kg, SpO2 92 %.   Exam: Gen Exam:Alert awake-not in any distress HEENT:atraumatic, normocephalic Chest: B/L clear to auscultation anteriorly CVS:S1S2 regular Abdomen:soft non tender, non distended Extremities:no edema Neurology: Non focal Skin: no rash   Pertinent Labs/Radiology: CBC Latest Ref Rng & Units 04/30/2021 04/29/2021 04/29/2021  WBC 4.0 - 10.5 K/uL 7.0 7.4 8.5  Hemoglobin 13.0 - 17.0 g/dL 7.5(L) 8.0(L) 8.4(L)  Hematocrit 39.0 - 52.0  % 23.1(L) 24.4(L) 25.8(L)  Platelets 150 - 400 K/uL 137(L) 132(L) 136(L)    Lab Results  Component Value Date   NA 138 04/30/2021   K 4.3 04/30/2021   CL 107 04/30/2021   CO2 24 04/30/2021      Assessment/Plan: * DKA (diabetic ketoacidosis) ([place])- (present on admission) DKA has resolved-CBGs stable on SQ insulin.  Newly diagnosed DM (diabetes mellitus), type 2 (A1c> 15.5 on 1/22) CBG stable-continue 22 units of Semglee daily and SSI.  Will need to be discharged home with insulin-we will plan to add metformin on discharge.   Recent Labs    04/29/21 2159 04/30/21 0154 04/30/21 0804  GLUCAP 121* 109* 116*    Ischemic segment of 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-s/p exploratory laparotomy on 1/22 General surgery following-remains on TNA-on clear liquid diet-having BMs but bloody.  See below.  Postoperative ileus Resolving-having BMs-tolerating liquids.  Lower GI bleed Suspicion for slow bleeding at the staple/incision at the anastomotic site per general surgery-since continues to bleed-we will discontinue IV heparin for now.  Plan to transfuse 1 unit of PRBC today.  Follow CBC.  If bleeding worsens/becomes frequent-May need to consider repeat CT angiogram.  Thrombocytopenia ([place]) Improving-watch closely.  Normocytic anemia Multifactorial-initially felt to be due to acute illness-but now due to acute blood loss in the setting of lower GI bleeding.  Transfusing 1 unit of PRBC today-follow-up posttransfusion CBC.  Acute renal failure (ARF) ([place])- (present on admission) Resolved-AKI was likely hemodynamically mediated.  Follow electrolytes periodically.  Paroxysmal atrial fibrillation ([place])- (present on admission) Maintaining sinus rhythm-on beta-blocker.  Given active GI bleeding-difficult situation-but holding IV heparin-patient aware of risk of embolic phenomenon but really  no good options.  Hypertension- (present on  admission) BP stable-continue beta-blocker.  Dyslipidemia- (present on admission) Continue statin and Vascepa  Hypomagnesemia Repleted.  Gout Continue allopurinol.   Nutrition Status: Nutrition Problem: Inadequate oral intake Etiology: poor appetite Signs/Symptoms: per patient/family report Interventions: Glucerna shake, TPN   BMI: Estimated body mass index is 24.41 kg/m as calculated from the following:   Height as of this encounter: 6' (1.829 m).   Weight as of this encounter: 81.6 kg.    DVT Prophylaxis: IV Heparin Procedures: Exploratory laparotomy on 1/22 Consults: Vascular surgery, general surgery Code Status:Full code  Family Communication: None at bedside   Disposition Plan: Status is: Inpatient Remains inpatient appropriate because: Small bowel ischemia-s/p exploratory laparotomy-resolving ileus-now with bright red blood per rectum.  Not yet stable for discharge.  Planned Discharge Destination: Home   Diet: Diet Order             DIET SOFT Room service appropriate? Yes; Fluid consistency: Thin  Diet effective now                     Antimicrobial agents: Anti-infectives (From admission, onward)    Start     Dose/Rate Route Frequency Ordered Stop   04/20/21 0200  piperacillin-tazobactam (ZOSYN) IVPB 3.375 g  Status:  Discontinued        3.375 g 12.5 mL/hr over 240 Minutes Intravenous Every 8 hours 04/19/21 1649 04/20/21 0105   04/20/21 0200  piperacillin-tazobactam (ZOSYN) IVPB 3.375 g        3.375 g 12.5 mL/hr over 240 Minutes Intravenous Every 8 hours 04/20/21 0105 04/24/21 0314   04/20/21 0000  piperacillin-tazobactam (ZOSYN) IVPB 3.375 g  Status:  Discontinued        3.375 g 100 mL/hr over 30 Minutes Intravenous Every 6 hours 04/19/21 2334 04/20/21 0105   04/19/21 1745  piperacillin-tazobactam (ZOSYN) IVPB 3.375 g        3.375 g 100 mL/hr over 30 Minutes Intravenous  Once 04/19/21 1649 04/19/21 1910        MEDICATIONS: Scheduled  Meds:  acetaminophen  650 mg Oral Q6H   allopurinol  100 mg Oral Daily   atorvastatin  80 mg Oral QHS   Chlorhexidine Gluconate Cloth  6 each Topical Daily   docusate sodium  100 mg Oral BID   escitalopram  20 mg Oral Daily   feeding supplement (GLUCERNA SHAKE)  237 mL Oral TID BM   furosemide  20 mg Intravenous Once   hydrocortisone  25 mg Rectal BID   icosapent Ethyl  2 g Oral BID   insulin aspart  0-15 Units Subcutaneous Q4H   insulin glargine-yfgn  22 Units Subcutaneous Daily   insulin starter kit- pen needles  1 kit Other Once   living well with diabetes book   Does not apply Once   metoprolol tartrate  25 mg Oral BID   pantoprazole  40 mg Oral Daily   sodium chloride flush  10-40 mL Intracatheter Q12H   traZODone  100 mg Oral QHS   Continuous Infusions:  dextrose 5% lactated ringers 50 mL/hr at 04/29/21 1707   lactated ringers 10 mL/hr at 04/28/21 1635   methocarbamol (ROBAXIN) IV     PRN Meds:.busPIRone, dextrose, hydrALAZINE, HYDROmorphone (DILAUDID) injection, hydrOXYzine, methocarbamol (ROBAXIN) IV, metoprolol tartrate, ondansetron (ZOFRAN) IV, oxyCODONE, prochlorperazine, simethicone, sodium chloride flush   I have personally reviewed following labs and imaging studies  LABORATORY DATA: CBC: Recent Labs  Lab 04/28/21 0354 04/29/21 0500 04/29/21  1300 04/29/21 1951 04/30/21 0415  WBC 7.3 9.6 8.5 7.4 7.0  HGB 8.7* 9.0* 8.4* 8.0* 7.5*  HCT 26.8* 27.6* 25.8* 24.4* 23.1*  MCV 98.5 99.3 100.0 100.4* 101.8*  PLT 132* 145* 136* 132* 137*    Basic Metabolic Panel: Recent Labs  Lab 04/25/21 0106 04/26/21 0253 04/27/21 0238 04/28/21 0354 04/29/21 0500 04/30/21 0415  NA 137 136 136 139 137 138  K 3.7 3.4* 3.9 4.6 4.5 4.3  CL 108 105 108 110 107 107  CO2 _0 GLUCOSE 215* 208* 155* 128* 160* 132*  BUN _1 CREATININE 0.98 0.92 0.88 0.95 1.18 1.01  CALCIUM 7.2* 7.3* 7.6* 8.0* 8.2* 7.9*  MG 1.8 1.7 1.8 1.7 1.5* 1.9  PHOS 2.2* 2.7 3.2  4.2 4.8*  --     GFR: Estimated Creatinine Clearance: 79 mL/min (by C-G formula based on SCr of 1.01 mg/dL).  Liver Function Tests: Recent Labs  Lab 04/24/21 0127 04/25/21 0106 04/27/21 0238 04/30/21 0415  AST 29 36 22 25  ALT _2 ALKPHOS 83 77 75 72  BILITOT 0.4 0.4 0.2* 0.4  PROT 4.7* 4.3* 4.6* 4.4*  ALBUMIN 1.8* 1.8* 1.8* 1.9*   No results for input(s): LIPASE, AMYLASE in the last 168 hours. No results for input(s): AMMONIA in the last 168 hours.  Coagulation Profile: No results for input(s): INR, PROTIME in the last 168 hours.  Cardiac Enzymes: No results for input(s): CKTOTAL, CKMB, CKMBINDEX, TROPONINI in the last 168 hours.  BNP (last 3 results) No results for input(s): PROBNP in the last 8760 hours.  Lipid Profile: No results for input(s): CHOL, HDL, LDLCALC, TRIG, CHOLHDL, LDLDIRECT in the last 72 hours.   Thyroid Function Tests: No results for input(s): TSH, T4TOTAL, FREET4, T3FREE, THYROIDAB in the last 72 hours.  Anemia Panel: No results for input(s): VITAMINB12, FOLATE, FERRITIN, TIBC, IRON, RETICCTPCT in the last 72 hours.  Urine analysis:    Component Value Date/Time   COLORURINE STRAW (A) 04/16/2021 2030   APPEARANCEUR CLEAR 04/16/2021 2030   LABSPEC 1.021 04/16/2021 2030   PHURINE 5.0 04/16/2021 2030   GLUCOSEU >=500 (A) 04/16/2021 2030   HGBUR MODERATE (A) 04/16/2021 2030   BILIRUBINUR NEGATIVE 04/16/2021 2030   KETONESUR 5 (A) 04/16/2021 2030   PROTEINUR NEGATIVE 04/16/2021 2030   UROBILINOGEN 0.2 05/01/2008 1735   NITRITE NEGATIVE 04/16/2021 2030   LEUKOCYTESUR NEGATIVE 04/16/2021 2030    Sepsis Labs: Lactic Acid, Venous    Component Value Date/Time   LATICACIDVEN 1.2 04/19/2021 1943    MICROBIOLOGY: No results found for this or any previous visit (from the past 240 hour(s)).  RADIOLOGY STUDIES/RESULTS: ECHOCARDIOGRAM COMPLETE  Result Date: 04/29/2021    ECHOCARDIOGRAM REPORT   Patient Name:   KEATIN BENHAM Date of  Exam: 04/29/2021 Medical Rec #:  [id number]     Height:       72.0 in Accession #:    [id number]    Weight:       180.0 lb Date of Birth:  [birth year]    BSA:          2.037 m Patient Age:    89 years      BP:           140/61 mmHg Patient Gender: M             HR:           63 bpm. Exam Location:  Inpatient Procedure: 2D Echo, Cardiac Doppler and Color Doppler Indications:    I48.91 AFIB  History:        Patient has prior history of Echocardiogram examinations, most                 recent 09/23/2020. Previous Myocardial Infarction and CAD, Stroke                 and TIA; Risk Factors:Hypertension and Dyslipidemia. DKA /                 DYSRHYTHMIA / PAD.  Sonographer:    Beryle Beams Referring Phys: [place]  1. Left ventricular ejection fraction, by estimation, is 55 to 60%. The left ventricle has normal function. The left ventricle has no regional wall motion abnormalities. Left ventricular diastolic parameters are consistent with Grade I diastolic dysfunction (impaired relaxation).  2. Right ventricular systolic function is normal. The right ventricular size is normal.  3. The mitral valve is normal in structure. Trivial mitral valve regurgitation. No evidence of mitral stenosis.  4. The aortic valve is normal in structure. Aortic valve regurgitation is not visualized. Aortic valve sclerosis is present, with no evidence of aortic valve stenosis.  5. The inferior vena cava is normal in size with greater than 50% respiratory variability, suggesting right atrial pressure of 3 mmHg. FINDINGS  Left Ventricle: Left ventricular ejection fraction, by estimation, is 55 to 60%. The left ventricle has normal function. The left ventricle has no regional wall motion abnormalities. The left ventricular internal cavity size was normal in size. There is  no left ventricular hypertrophy. Left ventricular diastolic parameters are consistent with Grade I diastolic dysfunction (impaired relaxation). Right  Ventricle: The right ventricular size is normal. No increase in right ventricular wall thickness. Right ventricular systolic function is normal. Left Atrium: Left atrial size was normal in size. Right Atrium: Right atrial size was normal in size. Pericardium: There is no evidence of pericardial effusion. Presence of epicardial fat layer. Mitral Valve: The mitral valve is normal in structure. Trivial mitral valve regurgitation. No evidence of mitral valve stenosis. Tricuspid Valve: The tricuspid valve is normal in structure. Tricuspid valve regurgitation is not demonstrated. No evidence of tricuspid stenosis. Aortic Valve: The aortic valve is normal in structure. Aortic valve regurgitation is not visualized. Aortic valve sclerosis is present, with no evidence of aortic valve stenosis. Aortic valve mean gradient measures 5.0 mmHg. Aortic valve peak gradient measures 9.7 mmHg. Aortic valve area, by VTI measures 2.36 cm. Pulmonic Valve: The pulmonic valve was normal in structure. Pulmonic valve regurgitation is not visualized. No evidence of pulmonic stenosis. Aorta: The aortic root is normal in size and structure. Venous: The inferior vena cava is normal in size with greater than 50% respiratory variability, suggesting right atrial pressure of 3 mmHg. IAS/Shunts: No atrial level shunt detected by color flow Doppler.  LEFT VENTRICLE PLAX 2D LVIDd:         5.00 cm     Diastology LVIDs:         2.30 cm     LV e' medial:    7.94 cm/s LV PW:         1.30 cm     LV E/e' medial:  12.5 LV IVS:        1.00 cm     LV e' lateral:   9.36 cm/s LVOT diam:     2.10 cm     LV E/e' lateral: 10.6  LV SV:         73 LV SV Index:   36 LVOT Area:     3.46 cm  LV Volumes (MOD) LV vol d, MOD A2C: 88.2 ml LV vol d, MOD A4C: 91.0 ml LV vol s, MOD A2C: 40.1 ml LV vol s, MOD A4C: 38.5 ml LV SV MOD A2C:     48.1 ml LV SV MOD A4C:     91.0 ml LV SV MOD BP:      49.4 ml RIGHT VENTRICLE             IVC RV S prime:     14.60 cm/s  IVC diam: 1.90 cm  TAPSE (M-mode): 2.8 cm LEFT ATRIUM             Index        RIGHT ATRIUM           Index LA diam:        3.60 cm 1.77 cm/m   RA Area:     15.70 cm LA Vol (A2C):   43.4 ml 21.30 ml/m  RA Volume:   42.30 ml  20.76 ml/m LA Vol (A4C):   46.7 ml 22.92 ml/m LA Biplane Vol: 45.5 ml 22.33 ml/m  AORTIC VALVE                     PULMONIC VALVE AV Area (Vmax):    2.15 cm      PV Vmax:       0.74 m/s AV Area (Vmean):   2.14 cm      PV Vmean:      46.000 cm/s AV Area (VTI):     2.36 cm      PV VTI:        0.133 m AV Vmax:           156.00 cm/s   PV Peak grad:  2.2 mmHg AV Vmean:          103.000 cm/s  PV Mean grad:  1.0 mmHg AV VTI:            0.311 m AV Peak Grad:      9.7 mmHg AV Mean Grad:      5.0 mmHg LVOT Vmax:         96.70 cm/s LVOT Vmean:        63.700 cm/s LVOT VTI:          0.212 m LVOT/AV VTI ratio: 0.68  AORTA Ao Root diam: 3.10 cm Ao Asc diam:  2.90 cm MITRAL VALVE                TRICUSPID VALVE MV Area (PHT): 2.48 cm     TV Peak grad:   23.4 mmHg MV Decel Time: 306 msec     TV Mean grad:   17.0 mmHg MV E velocity: 99.20 cm/s   TV Vmax:        2.42 m/s MV A velocity: 122.00 cm/s  TV Vmean:       198.0 cm/s MV E/A ratio:  0.81         TV VTI:         0.78 msec                              SHUNTS  Systemic VTI:  0.21 m                             Systemic Diam: 2.10 cm Kardie Tobb DO Electronically signed by Berniece Salines DO Signature Date/Time: 04/29/2021/6:00:58 PM    Final      LOS: 14 days   Oren Binet, MD  [facility]    To contact the attending provider between 7A-7P or the covering provider during after hours 7P-7A, please log into the web site [email or web address] and access using universal [place] password for that web site. If you do not have the password, please call the hospital operator.  04/30/2021, 11:56 AM

## 2021-05-01 LAB — TYPE AND SCREEN
ABO/RH(D): O NEG
Antibody Screen: NEGATIVE
Unit division: 0
Unit division: 0

## 2021-05-01 LAB — CBC
HCT: 28.1 % — ABNORMAL LOW (ref 39.0–52.0)
Hemoglobin: 9.4 g/dL — ABNORMAL LOW (ref 13.0–17.0)
MCH: 32.9 pg (ref 26.0–34.0)
MCHC: 33.5 g/dL (ref 30.0–36.0)
MCV: 98.3 fL (ref 80.0–100.0)
Platelets: 146 10*3/uL — ABNORMAL LOW (ref 150–400)
RBC: 2.86 MIL/uL — ABNORMAL LOW (ref 4.22–5.81)
RDW: 17.4 % — ABNORMAL HIGH (ref 11.5–15.5)
WBC: 7.7 10*3/uL (ref 4.0–10.5)
nRBC: 0.3 % — ABNORMAL HIGH (ref 0.0–0.2)

## 2021-05-01 LAB — BPAM RBC
Blood Product Expiration Date: 202302032359
Blood Product Expiration Date: 202302072359
ISSUE DATE / TIME: 202301301246
ISSUE DATE / TIME: 202302020719
Unit Type and Rh: 9500
Unit Type and Rh: 9500

## 2021-05-01 LAB — GLUCOSE, CAPILLARY
Glucose-Capillary: 146 mg/dL — ABNORMAL HIGH (ref 70–99)
Glucose-Capillary: 96 mg/dL (ref 70–99)
Glucose-Capillary: 278 mg/dL — ABNORMAL HIGH (ref 70–99)
Glucose-Capillary: 118 mg/dL — ABNORMAL HIGH (ref 70–99)
Glucose-Capillary: 158 mg/dL — ABNORMAL HIGH (ref 70–99)

## 2021-05-01 MED ORDER — ADULT MULTIVITAMIN W/MINERALS CH
1.0000 | ORAL_TABLET | Freq: Every day | ORAL | Status: DC
  Administered 2021-05-01 – 2021-05-03 (×3): 1 via ORAL
  Filled 2021-05-01 (×3): qty 1

## 2021-05-01 NOTE — Progress Notes (Signed)
12 Days Post-Op   Subjective/Chief Complaint: Last BM 2/1 PM, bloody. Denies any BMs yesterday or this morning. Tolerating SOFT diet. Having flatus. Hgb 9.4    Objective: Vital signs in last 24 hours: Temp:  [97.6 F (36.4 C)-98.6 F (37 C)] 97.6 F (36.4 C) (02/03 0746) Pulse Rate:  [56-66] 65 (02/03 0926) Resp:  [18-22] 22 (02/03 0746) BP: (121-143)/(64-71) 139/64 (02/03 0926) SpO2:  [92 %-96 %] 96 % (02/03 0746) Last BM Date: 04/29/21  Intake/Output from previous day: 02/02 0701 - 02/03 0700 In: 2119.9 [P.O.:592; I.V.:1039.9; Blood:488] Out: 68127.5 [TZGYF:74944.9] Intake/Output this shift: Total I/O In: 120 [P.O.:120] Out: 525 [Urine:525]  Gen:  Alert, NAD, pleasant Abd: Abdomen soft, mild distention, non-tender Midline wound with staples in place and no signs of infection.  Psych: A&Ox3  Skin: no rashes noted, warm and dry  Lab Results:  Recent Labs    04/30/21 1421 05/01/21 0451  WBC 7.4 7.7  HGB 9.8* 9.4*  HCT 29.2* 28.1*  PLT 151 146*   BMET Recent Labs    04/29/21 0500 04/30/21 0415  NA 137 138  K 4.5 4.3  CL 107 107  CO2 23 24  GLUCOSE 160* 132*  BUN 16 9  CREATININE 1.18 1.01  CALCIUM 8.2* 7.9*   PT/INR No results for input(s): LABPROT, INR in the last 72 hours. ABG No results for input(s): PHART, HCO3 in the last 72 hours.  Invalid input(s): PCO2, PO2  Studies/Results: ECHOCARDIOGRAM COMPLETE  Result Date: 04/29/2021    ECHOCARDIOGRAM REPORT   Patient Name:   [name] Date of Exam: 04/29/2021 Medical Rec #:  [id number]     Height:       72.0 in Accession #:    [id number]    Weight:       180.0 lb Date of Birth:  [birth year]    BSA:          2.037 m Patient Age:    66 years      BP:           140/61 mmHg Patient Gender: M             HR:           63 bpm. Exam Location:  Inpatient Procedure: 2D Echo, Cardiac Doppler and Color Doppler Indications:    I48.91 AFIB  History:        Patient has prior history of Echocardiogram examinations,  most                 recent 09/23/2020. Previous Myocardial Infarction and CAD, Stroke                 and TIA; Risk Factors:Hypertension and Dyslipidemia. DKA /                 DYSRHYTHMIA / PAD.  Sonographer:    Beryle Beams Referring Phys: [place]  1. Left ventricular ejection fraction, by estimation, is 55 to 60%. The left ventricle has normal function. The left ventricle has no regional wall motion abnormalities. Left ventricular diastolic parameters are consistent with Grade I diastolic dysfunction (impaired relaxation).  2. Right ventricular systolic function is normal. The right ventricular size is normal.  3. The mitral valve is normal in structure. Trivial mitral valve regurgitation. No evidence of mitral stenosis.  4. The aortic valve is normal in structure. Aortic valve regurgitation is not visualized. Aortic valve sclerosis is present, with no evidence of aortic valve stenosis.  5. The inferior vena cava is normal in size with greater than 50% respiratory variability, suggesting right atrial pressure of 3 mmHg. FINDINGS  Left Ventricle: Left ventricular ejection fraction, by estimation, is 55 to 60%. The left ventricle has normal function. The left ventricle has no regional wall motion abnormalities. The left ventricular internal cavity size was normal in size. There is  no left ventricular hypertrophy. Left ventricular diastolic parameters are consistent with Grade I diastolic dysfunction (impaired relaxation). Right Ventricle: The right ventricular size is normal. No increase in right ventricular wall thickness. Right ventricular systolic function is normal. Left Atrium: Left atrial size was normal in size. Right Atrium: Right atrial size was normal in size. Pericardium: There is no evidence of pericardial effusion. Presence of epicardial fat layer. Mitral Valve: The mitral valve is normal in structure. Trivial mitral valve regurgitation. No evidence of mitral valve  stenosis. Tricuspid Valve: The tricuspid valve is normal in structure. Tricuspid valve regurgitation is not demonstrated. No evidence of tricuspid stenosis. Aortic Valve: The aortic valve is normal in structure. Aortic valve regurgitation is not visualized. Aortic valve sclerosis is present, with no evidence of aortic valve stenosis. Aortic valve mean gradient measures 5.0 mmHg. Aortic valve peak gradient measures 9.7 mmHg. Aortic valve area, by VTI measures 2.36 cm. Pulmonic Valve: The pulmonic valve was normal in structure. Pulmonic valve regurgitation is not visualized. No evidence of pulmonic stenosis. Aorta: The aortic root is normal in size and structure. Venous: The inferior vena cava is normal in size with greater than 50% respiratory variability, suggesting right atrial pressure of 3 mmHg. IAS/Shunts: No atrial level shunt detected by color flow Doppler.  LEFT VENTRICLE PLAX 2D LVIDd:         5.00 cm     Diastology LVIDs:         2.30 cm     LV e' medial:    7.94 cm/s LV PW:         1.30 cm     LV E/e' medial:  12.5 LV IVS:        1.00 cm     LV e' lateral:   9.36 cm/s LVOT diam:     2.10 cm     LV E/e' lateral: 10.6 LV SV:         73 LV SV Index:   36 LVOT Area:     3.46 cm  LV Volumes (MOD) LV vol d, MOD A2C: 88.2 ml LV vol d, MOD A4C: 91.0 ml LV vol s, MOD A2C: 40.1 ml LV vol s, MOD A4C: 38.5 ml LV SV MOD A2C:     48.1 ml LV SV MOD A4C:     91.0 ml LV SV MOD BP:      49.4 ml RIGHT VENTRICLE             IVC RV S prime:     14.60 cm/s  IVC diam: 1.90 cm TAPSE (M-mode): 2.8 cm LEFT ATRIUM             Index        RIGHT ATRIUM           Index LA diam:        3.60 cm 1.77 cm/m   RA Area:     15.70 cm LA Vol (A2C):   43.4 ml 21.30 ml/m  RA Volume:   42.30 ml  20.76 ml/m LA Vol (A4C):   46.7 ml 22.92 ml/m LA Biplane Vol: 45.5 ml 22.33  ml/m  AORTIC VALVE                     PULMONIC VALVE AV Area (Vmax):    2.15 cm      PV Vmax:       0.74 m/s AV Area (Vmean):   2.14 cm      PV Vmean:      46.000 cm/s  AV Area (VTI):     2.36 cm      PV VTI:        0.133 m AV Vmax:           156.00 cm/s   PV Peak grad:  2.2 mmHg AV Vmean:          103.000 cm/s  PV Mean grad:  1.0 mmHg AV VTI:            0.311 m AV Peak Grad:      9.7 mmHg AV Mean Grad:      5.0 mmHg LVOT Vmax:         96.70 cm/s LVOT Vmean:        63.700 cm/s LVOT VTI:          0.212 m LVOT/AV VTI ratio: 0.68  AORTA Ao Root diam: 3.10 cm Ao Asc diam:  2.90 cm MITRAL VALVE                TRICUSPID VALVE MV Area (PHT): 2.48 cm     TV Peak grad:   23.4 mmHg MV Decel Time: 306 msec     TV Mean grad:   17.0 mmHg MV E velocity: 99.20 cm/s   TV Vmax:        2.42 m/s MV A velocity: 122.00 cm/s  TV Vmean:       198.0 cm/s MV E/A ratio:  0.81         TV VTI:         0.78 msec                              SHUNTS                             Systemic VTI:  0.21 m                             Systemic Diam: 2.10 cm Kardie Tobb DO Electronically signed by Berniece Salines DO Signature Date/Time: 04/29/2021/6:00:58 PM    Final     Anti-infectives: Anti-infectives (From admission, onward)    Start     Dose/Rate Route Frequency Ordered Stop   04/20/21 0200  piperacillin-tazobactam (ZOSYN) IVPB 3.375 g  Status:  Discontinued        3.375 g 12.5 mL/hr over 240 Minutes Intravenous Every 8 hours 04/19/21 1649 04/20/21 0105   04/20/21 0200  piperacillin-tazobactam (ZOSYN) IVPB 3.375 g        3.375 g 12.5 mL/hr over 240 Minutes Intravenous Every 8 hours 04/20/21 0105 04/24/21 0314   04/20/21 0000  piperacillin-tazobactam (ZOSYN) IVPB 3.375 g  Status:  Discontinued        3.375 g 100 mL/hr over 30 Minutes Intravenous Every 6 hours 04/19/21 2334 04/20/21 0105   04/19/21 1745  piperacillin-tazobactam (ZOSYN) IVPB 3.375 g        3.375 g 100 mL/hr over 30 Minutes Intravenous  Once 04/19/21 1649 04/19/21 1910       Assessment/Plan: POD 12 s/p exploratory laparotomy with ileocecectomy for 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  Thermon Leyland 1/22 - Pt w/ 3 bloody BMs 2/1, no further BMs. no tachycardia or hypotension - hgb 8.5 > 8.0 > 7.5, got1 u pRBC yesterday, hgb came up to 9.8, stable at 9.4 this AM - would continue holding heparin today Heparin, consider resumption tomorrow if remains stable. - SOFT diet. -Will plan to d/c staples prior to discharge. No home health needs predicted.   FEN: SOFT.   ID: [email or web address] 1/22 - 1/26 VTE: hep gtt on hold due to hematochezia    Per primary: T2DM with DKA AKI Afib on eliquis baseline HTN  LOS: 15 days    [name] 05/01/2021

## 2021-05-01 NOTE — Progress Notes (Signed)
PROGRESS NOTE        PATIENT DETAILS Name: [name] Age: 66 y.o. Sex: male Date of Birth: 10-11-1955 Admit Date: 04/16/2021 Admitting Physician Thurnell Lose, MD AOZ:[id number], Christean Grief, MD  Brief Summary: 66 year old male with history of HTN, CAD-who presented with symptomatic hyperglycemia-was found to have DKA-subsequently stabilized with IVF, IV insulin-subsequent hospital course complicated by sudden abdominal pain-found to have ischemic bowel and underwent laparotomy.  Post surgery-patient developed ileus and bright red blood per stools.  See below for further details.  Significant Hospital events: 1/19>> admit for DKA 1/22>> sudden abdominal pain--ischemia of small bowel. 2/01>> lower GI bleeding 2/02>> Heparin infusion discontinued  Significant imaging studies: 1/22>> CT abdomen/pelvis: Distal ileum with adjacent inflammation/small amount of free fluid. 1/22>> CT angio abdomen: No aneurysm/dissection of abdominal aorta-moderate mixed calcific atherosclerosis without high-grade stenosis of branch vessels. 1/29>> CT abdomen/pelvis: Distal small bowel obstruction with transition point near the ileocecal anastomosis. 2/01>> Echo: EF 55-60%  Subjective: No BM since yesterday.  Passing flatus.   Objective: Vitals: Blood pressure (!) 123/55, pulse (!) 56, temperature 97.6 F (36.4 C), temperature source Oral, resp. rate 18, height 6' (1.829 m), weight 81.6 kg, SpO2 96 %.   Exam: Gen Exam:Alert awake-not in any distress HEENT:atraumatic, normocephalic Chest: B/L clear to auscultation anteriorly CVS:S1S2 regular Abdomen:soft non tender, non distended Extremities:no edema Neurology: Non focal Skin: no rash   Pertinent Labs/Radiology: CBC Latest Ref Rng & Units 05/01/2021 04/30/2021 04/30/2021  WBC 4.0 - 10.5 K/uL 7.7 7.4 7.0  Hemoglobin 13.0 - 17.0 g/dL 9.4(L) 9.8(L) 7.5(L)  Hematocrit 39.0 - 52.0 % 28.1(L) 29.2(L) 23.1(L)  Platelets 150 - 400  K/uL 146(L) 151 137(L)    Lab Results  Component Value Date   NA 138 04/30/2021   K 4.3 04/30/2021   CL 107 04/30/2021   CO2 24 04/30/2021      Assessment/Plan: * DKA (diabetic ketoacidosis) ([place])- (present on admission) DKA has resolved-CBGs stable on SQ insulin.  Newly diagnosed DM (diabetes mellitus), type 2 (A1c> 15.5 on 1/22) CBG stable-continue 22 units of Semglee daily and SSI.  Will need to be discharged home with insulin-we will plan to add metformin on discharge.   Recent Labs    05/01/21 0341 05/01/21 0748 05/01/21 1201  GLUCAP 146* 96 278*    Ischemic segment of 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-s/p exploratory laparotomy on 1/22 General surgery following-remains on TNA-diet being advanced-General surgery following.  Has not had any BM for the past 24 hours.  Postoperative ileus Resolving-diet being advanced.  Lower GI bleed Suspicion for slow bleeding at the staple/incision at the anastomotic site-no further lower GI bleeding since heparin drip was discontinued on 2/2.  Hemoglobin stable after 1 unit of PRBC transfusion on 2/2.  Plan is to hold heparin for another 24 hours-and reassess on 2/4.   Thrombocytopenia ([place]) Improving-watch closely.  Normocytic anemia Multifactorial-initially felt to be due to acute illness-but now due to acute blood loss in the setting of lower GI bleeding.  Hemoglobin stable after 1 unit of PRBC transfusion on 2/2.  Acute renal failure (ARF) ([place])- (present on admission) Resolved-AKI was likely hemodynamically mediated.  Follow electrolytes periodically.  Paroxysmal atrial fibrillation ([place])- (present on admission) Maintaining sinus rhythm-on beta-blocker.  Given active GI bleeding-difficult situation-but holding IV heparin-patient aware of risk of embolic phenomenon but really  no good options.  Hypertension- (present on admission) BP stable-continue beta-blocker.  Dyslipidemia-  (present on admission) Continue statin and Vascepa  Hypomagnesemia Repleted.  Gout Continue allopurinol.   Nutrition Status: Nutrition Problem: Inadequate oral intake Etiology: poor appetite Signs/Symptoms: per patient/family report Interventions: Glucerna shake, Education   BMI: Estimated body mass index is 24.41 kg/m as calculated from the following:   Height as of this encounter: 6' (1.829 m).   Weight as of this encounter: 81.6 kg.    DVT Prophylaxis: IV Heparin Procedures: Exploratory laparotomy on 1/22 Consults: Vascular surgery, general surgery Code Status:Full code  Family Communication: None at bedside   Disposition Plan: Status is: Inpatient Remains inpatient appropriate because: Small bowel ischemia-s/p exploratory laparotomy-resolving ileus-now with bright red blood per rectum.  Not yet stable for discharge.  Planned Discharge Destination: Home   Diet: Diet Order             DIET SOFT Room service appropriate? Yes; Fluid consistency: Thin  Diet effective now                     Antimicrobial agents: Anti-infectives (From admission, onward)    Start     Dose/Rate Route Frequency Ordered Stop   04/20/21 0200  piperacillin-tazobactam (ZOSYN) IVPB 3.375 g  Status:  Discontinued        3.375 g 12.5 mL/hr over 240 Minutes Intravenous Every 8 hours 04/19/21 1649 04/20/21 0105   04/20/21 0200  piperacillin-tazobactam (ZOSYN) IVPB 3.375 g        3.375 g 12.5 mL/hr over 240 Minutes Intravenous Every 8 hours 04/20/21 0105 04/24/21 0314   04/20/21 0000  piperacillin-tazobactam (ZOSYN) IVPB 3.375 g  Status:  Discontinued        3.375 g 100 mL/hr over 30 Minutes Intravenous Every 6 hours 04/19/21 2334 04/20/21 0105   04/19/21 1745  piperacillin-tazobactam (ZOSYN) IVPB 3.375 g        3.375 g 100 mL/hr over 30 Minutes Intravenous  Once 04/19/21 1649 04/19/21 1910        MEDICATIONS: Scheduled Meds:  acetaminophen  650 mg Oral Q6H   allopurinol   100 mg Oral Daily   atorvastatin  80 mg Oral QHS   Chlorhexidine Gluconate Cloth  6 each Topical Daily   docusate sodium  100 mg Oral BID   escitalopram  20 mg Oral Daily   feeding supplement (GLUCERNA SHAKE)  237 mL Oral TID BM   icosapent Ethyl  2 g Oral BID   insulin aspart  0-15 Units Subcutaneous Q4H   insulin glargine-yfgn  22 Units Subcutaneous Daily   insulin starter kit- pen needles  1 kit Other Once   living well with diabetes book   Does not apply Once   metoprolol tartrate  25 mg Oral BID   multivitamin with minerals  1 tablet Oral Daily   pantoprazole  40 mg Oral Daily   sodium chloride flush  10-40 mL Intracatheter Q12H   traZODone  100 mg Oral QHS   Continuous Infusions:  dextrose 5% lactated ringers 50 mL/hr at 05/01/21 0056   lactated ringers 10 mL/hr at 04/28/21 1635   methocarbamol (ROBAXIN) IV     PRN Meds:.busPIRone, dextrose, hydrALAZINE, HYDROmorphone (DILAUDID) injection, hydrOXYzine, methocarbamol (ROBAXIN) IV, metoprolol tartrate, ondansetron (ZOFRAN) IV, oxyCODONE, prochlorperazine, simethicone, sodium chloride flush   I have personally reviewed following labs and imaging studies  LABORATORY DATA: CBC: Recent Labs  Lab 04/29/21 1300 04/29/21 1951 04/30/21 0415 04/30/21 1421 05/01/21 0451  WBC 8.5 7.4 7.0 7.4 7.7  HGB 8.4* 8.0* 7.5* 9.8* 9.4*  HCT 25.8* 24.4* 23.1* 29.2* 28.1*  MCV 100.0 100.4* 101.8* 97.3 98.3  PLT 136* 132* 137* 151 146*     Basic Metabolic Panel: Recent Labs  Lab 04/25/21 0106 04/26/21 0253 04/27/21 0238 04/28/21 0354 04/29/21 0500 04/30/21 0415  NA 137 136 136 139 137 138  K 3.7 3.4* 3.9 4.6 4.5 4.3  CL 108 105 108 110 107 107  CO2 _0 GLUCOSE 215* 208* 155* 128* 160* 132*  BUN _1 CREATININE 0.98 0.92 0.88 0.95 1.18 1.01  CALCIUM 7.2* 7.3* 7.6* 8.0* 8.2* 7.9*  MG 1.8 1.7 1.8 1.7 1.5* 1.9  PHOS 2.2* 2.7 3.2 4.2 4.8*  --      GFR: Estimated Creatinine Clearance: 79 mL/min (by C-G  formula based on SCr of 1.01 mg/dL).  Liver Function Tests: Recent Labs  Lab 04/25/21 0106 04/27/21 0238 04/30/21 0415  AST 36 22 25  ALT _2 ALKPHOS 77 75 72  BILITOT 0.4 0.2* 0.4  PROT 4.3* 4.6* 4.4*  ALBUMIN 1.8* 1.8* 1.9*    No results for input(s): LIPASE, AMYLASE in the last 168 hours. No results for input(s): AMMONIA in the last 168 hours.  Coagulation Profile: No results for input(s): INR, PROTIME in the last 168 hours.  Cardiac Enzymes: No results for input(s): CKTOTAL, CKMB, CKMBINDEX, TROPONINI in the last 168 hours.  BNP (last 3 results) No results for input(s): PROBNP in the last 8760 hours.  Lipid Profile: No results for input(s): CHOL, HDL, LDLCALC, TRIG, CHOLHDL, LDLDIRECT in the last 72 hours.   Thyroid Function Tests: No results for input(s): TSH, T4TOTAL, FREET4, T3FREE, THYROIDAB in the last 72 hours.  Anemia Panel: No results for input(s): VITAMINB12, FOLATE, FERRITIN, TIBC, IRON, RETICCTPCT in the last 72 hours.  Urine analysis:    Component Value Date/Time   COLORURINE STRAW (A) 04/16/2021 2030   APPEARANCEUR CLEAR 04/16/2021 2030   LABSPEC 1.021 04/16/2021 2030   PHURINE 5.0 04/16/2021 2030   GLUCOSEU >=500 (A) 04/16/2021 2030   HGBUR MODERATE (A) 04/16/2021 2030   BILIRUBINUR NEGATIVE 04/16/2021 2030   KETONESUR 5 (A) 04/16/2021 2030   PROTEINUR NEGATIVE 04/16/2021 2030   UROBILINOGEN 0.2 05/01/2008 1735   NITRITE NEGATIVE 04/16/2021 2030   LEUKOCYTESUR NEGATIVE 04/16/2021 2030    Sepsis Labs: Lactic Acid, Venous    Component Value Date/Time   LATICACIDVEN 1.2 04/19/2021 1943    MICROBIOLOGY: No results found for this or any previous visit (from the past 240 hour(s)).  RADIOLOGY STUDIES/RESULTS: ECHOCARDIOGRAM COMPLETE  Result Date: 04/29/2021    ECHOCARDIOGRAM REPORT   Patient Name:   NYAIRE DENBLEYKER Date of Exam: 04/29/2021 Medical Rec #:  [id number]     Height:       72.0 in Accession #:    [id number]    Weight:        180.0 lb Date of Birth:  1955-10-02    BSA:          2.037 m Patient Age:    59 years      BP:           140/61 mmHg Patient Gender: M             HR:           63 bpm. Exam Location:  Inpatient Procedure: 2D Echo, Cardiac Doppler and Color Doppler Indications:  I48.91 AFIB  History:        Patient has prior history of Echocardiogram examinations, most                 recent 09/23/2020. Previous Myocardial Infarction and CAD, Stroke                 and TIA; Risk Factors:Hypertension and Dyslipidemia. DKA /                 DYSRHYTHMIA / PAD.  Sonographer:    Beryle Beams Referring Phys: [place]  1. Left ventricular ejection fraction, by estimation, is 55 to 60%. The left ventricle has normal function. The left ventricle has no regional wall motion abnormalities. Left ventricular diastolic parameters are consistent with Grade I diastolic dysfunction (impaired relaxation).  2. Right ventricular systolic function is normal. The right ventricular size is normal.  3. The mitral valve is normal in structure. Trivial mitral valve regurgitation. No evidence of mitral stenosis.  4. The aortic valve is normal in structure. Aortic valve regurgitation is not visualized. Aortic valve sclerosis is present, with no evidence of aortic valve stenosis.  5. The inferior vena cava is normal in size with greater than 50% respiratory variability, suggesting right atrial pressure of 3 mmHg. FINDINGS  Left Ventricle: Left ventricular ejection fraction, by estimation, is 55 to 60%. The left ventricle has normal function. The left ventricle has no regional wall motion abnormalities. The left ventricular internal cavity size was normal in size. There is  no left ventricular hypertrophy. Left ventricular diastolic parameters are consistent with Grade I diastolic dysfunction (impaired relaxation). Right Ventricle: The right ventricular size is normal. No increase in right ventricular wall thickness. Right  ventricular systolic function is normal. Left Atrium: Left atrial size was normal in size. Right Atrium: Right atrial size was normal in size. Pericardium: There is no evidence of pericardial effusion. Presence of epicardial fat layer. Mitral Valve: The mitral valve is normal in structure. Trivial mitral valve regurgitation. No evidence of mitral valve stenosis. Tricuspid Valve: The tricuspid valve is normal in structure. Tricuspid valve regurgitation is not demonstrated. No evidence of tricuspid stenosis. Aortic Valve: The aortic valve is normal in structure. Aortic valve regurgitation is not visualized. Aortic valve sclerosis is present, with no evidence of aortic valve stenosis. Aortic valve mean gradient measures 5.0 mmHg. Aortic valve peak gradient measures 9.7 mmHg. Aortic valve area, by VTI measures 2.36 cm. Pulmonic Valve: The pulmonic valve was normal in structure. Pulmonic valve regurgitation is not visualized. No evidence of pulmonic stenosis. Aorta: The aortic root is normal in size and structure. Venous: The inferior vena cava is normal in size with greater than 50% respiratory variability, suggesting right atrial pressure of 3 mmHg. IAS/Shunts: No atrial level shunt detected by color flow Doppler.  LEFT VENTRICLE PLAX 2D LVIDd:         5.00 cm     Diastology LVIDs:         2.30 cm     LV e' medial:    7.94 cm/s LV PW:         1.30 cm     LV E/e' medial:  12.5 LV IVS:        1.00 cm     LV e' lateral:   9.36 cm/s LVOT diam:     2.10 cm     LV E/e' lateral: 10.6 LV SV:         73 LV SV  Index:   36 LVOT Area:     3.46 cm  LV Volumes (MOD) LV vol d, MOD A2C: 88.2 ml LV vol d, MOD A4C: 91.0 ml LV vol s, MOD A2C: 40.1 ml LV vol s, MOD A4C: 38.5 ml LV SV MOD A2C:     48.1 ml LV SV MOD A4C:     91.0 ml LV SV MOD BP:      49.4 ml RIGHT VENTRICLE             IVC RV S prime:     14.60 cm/s  IVC diam: 1.90 cm TAPSE (M-mode): 2.8 cm LEFT ATRIUM             Index        RIGHT ATRIUM           Index LA diam:         3.60 cm 1.77 cm/m   RA Area:     15.70 cm LA Vol (A2C):   43.4 ml 21.30 ml/m  RA Volume:   42.30 ml  20.76 ml/m LA Vol (A4C):   46.7 ml 22.92 ml/m LA Biplane Vol: 45.5 ml 22.33 ml/m  AORTIC VALVE                     PULMONIC VALVE AV Area (Vmax):    2.15 cm      PV Vmax:       0.74 m/s AV Area (Vmean):   2.14 cm      PV Vmean:      46.000 cm/s AV Area (VTI):     2.36 cm      PV VTI:        0.133 m AV Vmax:           156.00 cm/s   PV Peak grad:  2.2 mmHg AV Vmean:          103.000 cm/s  PV Mean grad:  1.0 mmHg AV VTI:            0.311 m AV Peak Grad:      9.7 mmHg AV Mean Grad:      5.0 mmHg LVOT Vmax:         96.70 cm/s LVOT Vmean:        63.700 cm/s LVOT VTI:          0.212 m LVOT/AV VTI ratio: 0.68  AORTA Ao Root diam: 3.10 cm Ao Asc diam:  2.90 cm MITRAL VALVE                TRICUSPID VALVE MV Area (PHT): 2.48 cm     TV Peak grad:   23.4 mmHg MV Decel Time: 306 msec     TV Mean grad:   17.0 mmHg MV E velocity: 99.20 cm/s   TV Vmax:        2.42 m/s MV A velocity: 122.00 cm/s  TV Vmean:       198.0 cm/s MV E/A ratio:  0.81         TV VTI:         0.78 msec                              SHUNTS                             Systemic VTI:  0.21 m  Systemic Diam: 2.10 cm Godfrey Pick Tobb DO Electronically signed by Berniece Salines DO Signature Date/Time: 04/29/2021/6:00:58 PM    Final      LOS: 15 days   Oren Binet, MD  [facility]    To contact the attending provider between 7A-7P or the covering provider during after hours 7P-7A, please log into the web site [email or web address] and access using universal [place] password for that web site. If you do not have the password, please call the hospital operator.  05/01/2021, 12:38 PM

## 2021-05-01 NOTE — Progress Notes (Signed)
Nutrition Follow-up  DOCUMENTATION CODES:   Not applicable  INTERVENTION:   -Continue Glucerna Shake po TID, each supplement provides 220 kcal and 10 grams of protein  -MVI with minerals daily -Referred to [facility] for further reinforcement of DM self-management in the outpatient setting  NUTRITION DIAGNOSIS:   Inadequate oral intake related to poor appetite as evidenced by per patient/family report.  Progressing; TPN d/c and advanced to a soft diet on 04/30/21  GOAL:   Patient will meet greater than or equal to 90% of their needs  Progressing   MONITOR:   Supplement acceptance, Labs, Weight trends, PO intake, Skin, I & O's  REASON FOR ASSESSMENT:   LOS    ASSESSMENT:   66 y.o. male presented to the ED from PCP due to elevated CBG and Hgb A1c level. Pt had been experiencing polyuria, tremors, weakness, and polydipsia for a couple weeks. PMH includes HTN, TIA, and CAD. Pt admitted with DKA and new diagnosis of diabetes.  01/22 - Ex-lap for ischemic bowel s/p ileocecectomy 01/24 - diet advanced to clear liquids 01/25 - diet advanced to full liquids 01/26 - diet advanced to SOFT 01/27 - NPO; TPN started 01/30 - diet advanced to clear liquids 01/31 - diet advanced to full liquids, pharmacy began weaning TPB 02/01- TPN d/c 02/02- advanced to soft diet    Reviewed I/O's: -29.3 L x 24 hours and -30.6 L since admission  UOP: 31.3 L x 24 hours  Pt unavailable at time of visit. Attempted to speak with pt via call to hospital room phone, however, unable to reach.   Pt advanced to a soft diet yesterday and tolerating well. Noted meal completions 50-100%. Pt is consuming Glucerna supplements per MAR.   Noted pt with new diagnosis of DM this admission; pt was educated by RD on 04/23/21. RD entered referral to [facility] for additional support and reinforcement.   Medications reviewed  and include colace and dextrose 5% in lactated ringers infusion @ 50 ml/hr.   Labs reviewed: CBGS: 96-167 (inpatient orders for glycemic control are 0-15 units insulin aspart every 4 hours and 22 units insulin glargine-yfgn daily).    Diet Order:   Diet Order             DIET SOFT Room service appropriate? Yes; Fluid consistency: Thin  Diet effective now                   EDUCATION NEEDS:   No education needs have been identified at this time  Skin:  Skin Assessment: Skin Integrity Issues: Skin Integrity Issues:: Incisions Incisions: closed abdomen  Last BM:  04/29/21  Height:   Ht Readings from Last 1 Encounters:  04/16/21 6' (1.829 m)    Weight:   Wt Readings from Last 1 Encounters:  04/16/21 81.6 kg    Ideal Body Weight:  80.9 kg  BMI:  Body mass index is 24.41 kg/m.  Estimated Nutritional Needs:   Kcal:  2100-2300  Protein:  105-120 grams  Fluid:  >/= 2.1 L    Loistine Chance, RD, LDN, [place] Registered Dietitian II Certified Diabetes Care and Education Specialist Please refer to [facility] for RD and/or RD on-call/weekend/after hours pager

## 2021-05-02 LAB — GLUCOSE, CAPILLARY
Glucose-Capillary: 123 mg/dL — ABNORMAL HIGH (ref 70–99)
Glucose-Capillary: 108 mg/dL — ABNORMAL HIGH (ref 70–99)
Glucose-Capillary: 113 mg/dL — ABNORMAL HIGH (ref 70–99)
Glucose-Capillary: 213 mg/dL — ABNORMAL HIGH (ref 70–99)
Glucose-Capillary: 130 mg/dL — ABNORMAL HIGH (ref 70–99)
Glucose-Capillary: 128 mg/dL — ABNORMAL HIGH (ref 70–99)

## 2021-05-02 LAB — CBC
HCT: 28 % — ABNORMAL LOW (ref 39.0–52.0)
Hemoglobin: 9.1 g/dL — ABNORMAL LOW (ref 13.0–17.0)
MCH: 32.2 pg (ref 26.0–34.0)
MCHC: 32.5 g/dL (ref 30.0–36.0)
MCV: 98.9 fL (ref 80.0–100.0)
Platelets: 151 10*3/uL (ref 150–400)
RBC: 2.83 MIL/uL — ABNORMAL LOW (ref 4.22–5.81)
RDW: 17 % — ABNORMAL HIGH (ref 11.5–15.5)
WBC: 7.7 10*3/uL (ref 4.0–10.5)
nRBC: 0.3 % — ABNORMAL HIGH (ref 0.0–0.2)

## 2021-05-02 MED ORDER — HEPARIN (PORCINE) 25000 UT/250ML-% IV SOLN
1250.0000 [IU]/h | INTRAVENOUS | Status: DC
  Administered 2021-05-02: 1250 [IU]/h via INTRAVENOUS
  Filled 2021-05-02: qty 250

## 2021-05-02 NOTE — Progress Notes (Signed)
ANTICOAGULATION CONSULT NOTE - Follow Up Consult  Pharmacy Consult for Heparin Indication: atrial fibrillation  Allergies  Allergen Reactions   Vicodin [Hydrocodone-Acetaminophen] Nausea And Vomiting    Patient Measurements: Height: 6' (182.9 cm) Weight: 81.6 kg (180 lb) IBW/kg (Calculated) : 77.6 Heparin Dosing Weight:  77.6 kg  Vital Signs: Temp: 98.3 F (36.8 C) (02/04 1146) Temp Source: Oral (02/04 1146) BP: 112/63 (02/04 1146) Pulse Rate: 62 (02/04 1146)  Labs: Recent Labs    04/30/21 0415 04/30/21 1421 05/01/21 0451 05/02/21 0355  HGB 7.5* 9.8* 9.4* 9.1*  HCT 23.1* 29.2* 28.1* 28.0*  PLT 137* 151 146* 151  HEPARINUNFRC 0.49  --   --   --   CREATININE 1.01  --   --   --      Estimated Creatinine Clearance: 79 mL/min (by C-G formula based on SCr of 1.01 mg/dL).   Assessment: 66 yo M presented for DKA on 1/19. PMH significant for afib on PTA Apixaban 5 mg, LD 1/22 AM. 01/22 KCentra given (~50 units/kg x 1) for urgent OR. Heparin started 1/23 per pharmacy. Bloody BM 1/27 > heparin held, then resumed. Heparin held again on 2/2 due to bloody BM. Pharmacy consulted to restart IV heparin 2/4 with lower therapeutic goal.   Hgb 9.4, plt 151. Therapeutic with heparin level of 0.4 prior to holding on 1250 units/hr.  Goal of Therapy:  Heparin level 0.3-0.5 units/ml Monitor platelets by anticoagulation protocol: Yes   Plan:  Restart heparin drip at 1250 units/hr Monitor for s/s of bleeding including bloody BM 8h Heparin level  Daily HL and CBC. F/u ability to transition to PTA apixaban   Thank you for allowing pharmacy to participate in this patient's care.  Levonne Spiller, PharmD PGY1 Acute Care Resident  05/02/2021,2:18 PM

## 2021-05-02 NOTE — Progress Notes (Signed)
13 Days Post-Op   Subjective/Chief Complaint: Last BM 2/1 PM, bloody. Denies any BMs yesterday or this morning. Tolerating SOFT diet. Having flatus. Hgb stable at 9.1   Objective: Vital signs in last 24 hours: Temp:  [97.6 F (36.4 C)-98.5 F (36.9 C)] 97.6 F (36.4 C) (02/04 0815) Pulse Rate:  [50-85] 60 (02/04 0815) Resp:  [11-24] 16 (02/04 0815) BP: (109-142)/(55-73) 118/70 (02/04 0815) SpO2:  [92 %-98 %] 95 % (02/04 0815) Last BM Date: 04/29/21  Intake/Output from previous day: 02/03 0701 - 02/04 0700 In: 710 [P.O.:700; I.V.:10] Out: 4170 [Urine:4170] Intake/Output this shift: Total I/O In: -  Out: 225 [Urine:225]  Gen:  Alert, NAD, pleasant Abd: Abdomen soft, mild distention, non-tender Midline wound with staples in place and no signs of infection.  Psych: A&Ox3  Skin: no rashes noted, warm and dry  Lab Results:  Recent Labs    05/01/21 0451 05/02/21 0355  WBC 7.7 7.7  HGB 9.4* 9.1*  HCT 28.1* 28.0*  PLT 146* 151   BMET Recent Labs    04/30/21 0415  NA 138  K 4.3  CL 107  CO2 24  GLUCOSE 132*  BUN 9  CREATININE 1.01  CALCIUM 7.9*   PT/INR No results for input(s): LABPROT, INR in the last 72 hours. ABG No results for input(s): PHART, HCO3 in the last 72 hours.  Invalid input(s): PCO2, PO2  Studies/Results: No results found.  Anti-infectives: Anti-infectives (From admission, onward)    Start     Dose/Rate Route Frequency Ordered Stop   04/20/21 0200  piperacillin-tazobactam (ZOSYN) IVPB 3.375 g  Status:  Discontinued        3.375 g 12.5 mL/hr over 240 Minutes Intravenous Every 8 hours 04/19/21 1649 04/20/21 0105   04/20/21 0200  piperacillin-tazobactam (ZOSYN) IVPB 3.375 g        3.375 g 12.5 mL/hr over 240 Minutes Intravenous Every 8 hours 04/20/21 0105 04/24/21 0314   04/20/21 0000  piperacillin-tazobactam (ZOSYN) IVPB 3.375 g  Status:  Discontinued        3.375 g 100 mL/hr over 30 Minutes Intravenous Every 6 hours 04/19/21 2334  04/20/21 0105   04/19/21 1745  piperacillin-tazobactam (ZOSYN) IVPB 3.375 g        3.375 g 100 mL/hr over 30 Minutes Intravenous  Once 04/19/21 1649 04/19/21 1910       Assessment/Plan: POD 13 s/p exploratory laparotomy with ileocecectomy for 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 Thermon Leyland 1/22 - Pt w/ 3 bloody BMs 2/1, no further BMs. no tachycardia or hypotension - hgb 8.5 > 8.0 > 7.5, got1 u pRBC yesterday, hgb came up to 9.8, stable at 9.4 this AM - Ok to resume Eliquis - SOFT diet. -Will plan to d/c staples prior to discharge. No home health needs predicted.   FEN: SOFT.   ID: [email or web address] 1/22 - 1/26 VTE: hep gtt on hold due to hematochezia    Per primary: T2DM with DKA AKI Afib on eliquis baseline HTN  LOS: 16 days    Ileana Roup 05/02/2021

## 2021-05-02 NOTE — Progress Notes (Signed)
PROGRESS NOTE        PATIENT DETAILS Name: [name] Age: 66 y.o. Sex: male Date of Birth: 31-Mar-1955 Admit Date: 04/16/2021 Admitting Physician Thurnell Lose, MD MEB:[id number], Christean Grief, MD  Brief Summary: 66 year old male with history of HTN, CAD-who presented with symptomatic hyperglycemia-was found to have DKA-subsequently stabilized with IVF, IV insulin-subsequent hospital course complicated by sudden abdominal pain-found to have ischemic bowel and underwent laparotomy.  Post surgery-patient developed ileus and bright red blood per stools.  See below for further details.  Significant Hospital events: 1/19>> admit for DKA 1/22>> sudden abdominal pain--ischemia of small bowel. 2/01>> lower GI bleeding 2/02>> Heparin infusion discontinued 2/04>> no lower GI bleeding x2 days-IV heparin restarted  Significant imaging studies: 1/22>> CT abdomen/pelvis: Distal ileum with adjacent inflammation/small amount of free fluid. 1/22>> CT angio abdomen: No aneurysm/dissection of abdominal aorta-moderate mixed calcific atherosclerosis without high-grade stenosis of branch vessels. 1/29>> CT abdomen/pelvis: Distal small bowel obstruction with transition point near the ileocecal anastomosis. 2/01>> Echo: EF 55-60%  Subjective: No BM x2 days-passing flatus.  Objective: Vitals: Blood pressure 112/63, pulse 62, temperature 98.3 F (36.8 C), temperature source Oral, resp. rate 15, height 6' (1.829 m), weight 81.6 kg, SpO2 97 %.   Exam: Gen Exam:Alert awake-not in any distress HEENT:atraumatic, normocephalic Chest: B/L clear to auscultation anteriorly CVS:S1S2 regular Abdomen:soft non tender, non distended Extremities:no edema Neurology: Non focal Skin: no rash   Pertinent Labs/Radiology: CBC Latest Ref Rng & Units 05/02/2021 05/01/2021 04/30/2021  WBC 4.0 - 10.5 K/uL 7.7 7.7 7.4  Hemoglobin 13.0 - 17.0 g/dL 9.1(L) 9.4(L) 9.8(L)  Hematocrit 39.0 - 52.0 % 28.0(L)  28.1(L) 29.2(L)  Platelets 150 - 400 K/uL 151 146(L) 151    Lab Results  Component Value Date   NA 138 04/30/2021   K 4.3 04/30/2021   CL 107 04/30/2021   CO2 24 04/30/2021      Assessment/Plan: Assessment and Plan: * DKA (diabetic ketoacidosis) ([place])- (present on admission) DKA has resolved-CBGs stable on SQ insulin.  Newly diagnosed DM (diabetes mellitus), type 2 (A1c> 15.5 on 1/22) CBG stable-continue 22 units of Semglee daily and SSI.  Will need to be discharged home with insulin-we will plan to add metformin on discharge.   Recent Labs    05/02/21 0449 05/02/21 0814 05/02/21 1145  GLUCAP 108* 113* 213*    Ischemic segment of 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-s/p exploratory laparotomy on 1/22 General surgery following-no longer on TNA-diet being advanced-General surgery following.  Has not had any BM for the past 48 hours.  But is passing flatus.  Postoperative ileus Seems to have resolved-passing flatus but no BM x2 days-tolerating diet.  Lower GI bleed Suspicion for slow bleeding at the staple/incision at the anastomotic site-no further lower GI bleeding since heparin drip was discontinued on 2/2.  Hemoglobin stable after 1 unit of PRBC transfusion on 2/2.  Will resume heparin infusion today-if no further bleeding-we will transition to Eliquis on 2/5.  Thrombocytopenia ([place]) Improving-watch closely.  Normocytic anemia Multifactorial-initially felt to be due to acute illness-but now due to acute blood loss in the setting of lower GI bleeding.  Hemoglobin stable after 1 unit of PRBC transfusion on 2/2.  Acute renal failure (ARF) ([place])- (present on admission) Resolved-AKI was likely hemodynamically mediated.  Follow electrolytes periodically.  Paroxysmal atrial fibrillation ([place])- (present on admission)  Maintaining sinus rhythm-on beta-blocker.  Anticoagulation held x2 days to allow GI bleeding to resolve-restarting on  2/4.  Plan to restart Eliquis on 2/5 if no recurrent GI bleeding on IV heparin.  Hypertension- (present on admission) BP stable-continue beta-blocker.  Dyslipidemia- (present on admission) Continue statin and Vascepa  Hypomagnesemia Repleted.  Gout Continue allopurinol.  Nutrition Status: Nutrition Problem: Inadequate oral intake Etiology: poor appetite Signs/Symptoms: per patient/family report Interventions: Glucerna shake, Education   BMI: Estimated body mass index is 24.41 kg/m as calculated from the following:   Height as of this encounter: 6' (1.829 m).   Weight as of this encounter: 81.6 kg.   Code status:   Code Status: Full Code   DVT Prophylaxis: Heparin today.  Family Communication: None at bedside   Disposition Plan: Status is: Inpatient Remains inpatient appropriate because: Resolving GI bleeding-restarting IV heparin today-if no further bleeding-plan to restart Eliquis tomorrow.  Potential discharge in 1-2 days.     Planned Discharge Destination:Home health   Diet: Diet Order             DIET SOFT Room service appropriate? Yes; Fluid consistency: Thin  Diet effective now                     Antimicrobial agents: Anti-infectives (From admission, onward)    Start     Dose/Rate Route Frequency Ordered Stop   04/20/21 0200  piperacillin-tazobactam (ZOSYN) IVPB 3.375 g  Status:  Discontinued        3.375 g 12.5 mL/hr over 240 Minutes Intravenous Every 8 hours 04/19/21 1649 04/20/21 0105   04/20/21 0200  piperacillin-tazobactam (ZOSYN) IVPB 3.375 g        3.375 g 12.5 mL/hr over 240 Minutes Intravenous Every 8 hours 04/20/21 0105 04/24/21 0314   04/20/21 0000  piperacillin-tazobactam (ZOSYN) IVPB 3.375 g  Status:  Discontinued        3.375 g 100 mL/hr over 30 Minutes Intravenous Every 6 hours 04/19/21 2334 04/20/21 0105   04/19/21 1745  piperacillin-tazobactam (ZOSYN) IVPB 3.375 g        3.375 g 100 mL/hr over 30 Minutes Intravenous   Once 04/19/21 1649 04/19/21 1910        MEDICATIONS: Scheduled Meds:  acetaminophen  650 mg Oral Q6H   allopurinol  100 mg Oral Daily   atorvastatin  80 mg Oral QHS   Chlorhexidine Gluconate Cloth  6 each Topical Daily   docusate sodium  100 mg Oral BID   escitalopram  20 mg Oral Daily   feeding supplement (GLUCERNA SHAKE)  237 mL Oral TID BM   icosapent Ethyl  2 g Oral BID   insulin aspart  0-15 Units Subcutaneous Q4H   insulin glargine-yfgn  22 Units Subcutaneous Daily   insulin starter kit- pen needles  1 kit Other Once   living well with diabetes book   Does not apply Once   metoprolol tartrate  25 mg Oral BID   multivitamin with minerals  1 tablet Oral Daily   pantoprazole  40 mg Oral Daily   sodium chloride flush  10-40 mL Intracatheter Q12H   traZODone  100 mg Oral QHS   Continuous Infusions:  dextrose 5% lactated ringers 50 mL/hr at 05/01/21 2046   lactated ringers 10 mL/hr at 04/28/21 1635   methocarbamol (ROBAXIN) IV     PRN Meds:.busPIRone, dextrose, hydrALAZINE, HYDROmorphone (DILAUDID) injection, hydrOXYzine, methocarbamol (ROBAXIN) IV, metoprolol tartrate, ondansetron (ZOFRAN) IV, oxyCODONE, prochlorperazine, simethicone, sodium chloride flush  I have personally reviewed following labs and imaging studies  LABORATORY DATA: CBC: Recent Labs  Lab 04/29/21 1951 04/30/21 0415 04/30/21 1421 05/01/21 0451 05/02/21 0355  WBC 7.4 7.0 7.4 7.7 7.7  HGB 8.0* 7.5* 9.8* 9.4* 9.1*  HCT 24.4* 23.1* 29.2* 28.1* 28.0*  MCV 100.4* 101.8* 97.3 98.3 98.9  PLT 132* 137* 151 146* 536    Basic Metabolic Panel: Recent Labs  Lab 04/26/21 0253 04/27/21 0238 04/28/21 0354 04/29/21 0500 04/30/21 0415  NA 136 136 139 137 138  K 3.4* 3.9 4.6 4.5 4.3  CL 105 108 110 107 107  CO2 '24 23 23 23 24  ' GLUCOSE 208* 155* 128* 160* 132*  BUN '8 9 12 16 9  ' CREATININE 0.92 0.88 0.95 1.18 1.01  CALCIUM 7.3* 7.6* 8.0* 8.2* 7.9*  MG 1.7 1.8 1.7 1.5* 1.9  PHOS 2.7 3.2 4.2 4.8*  --      GFR: Estimated Creatinine Clearance: 79 mL/min (by C-G formula based on SCr of 1.01 mg/dL).  Liver Function Tests: Recent Labs  Lab 04/27/21 0238 04/30/21 0415  AST 22 25  ALT 20 23  ALKPHOS 75 72  BILITOT 0.2* 0.4  PROT 4.6* 4.4*  ALBUMIN 1.8* 1.9*   No results for input(s): LIPASE, AMYLASE in the last 168 hours. No results for input(s): AMMONIA in the last 168 hours.  Coagulation Profile: No results for input(s): INR, PROTIME in the last 168 hours.  Cardiac Enzymes: No results for input(s): CKTOTAL, CKMB, CKMBINDEX, TROPONINI in the last 168 hours.  BNP (last 3 results) No results for input(s): PROBNP in the last 8760 hours.  Lipid Profile: No results for input(s): CHOL, HDL, LDLCALC, TRIG, CHOLHDL, LDLDIRECT in the last 72 hours.  Thyroid Function Tests: No results for input(s): TSH, T4TOTAL, FREET4, T3FREE, THYROIDAB in the last 72 hours.  Anemia Panel: No results for input(s): VITAMINB12, FOLATE, FERRITIN, TIBC, IRON, RETICCTPCT in the last 72 hours.  Urine analysis:    Component Value Date/Time   COLORURINE STRAW (A) 04/16/2021 2030   APPEARANCEUR CLEAR 04/16/2021 2030   LABSPEC 1.021 04/16/2021 2030   PHURINE 5.0 04/16/2021 2030   GLUCOSEU >=500 (A) 04/16/2021 2030   HGBUR MODERATE (A) 04/16/2021 2030   BILIRUBINUR NEGATIVE 04/16/2021 2030   KETONESUR 5 (A) 04/16/2021 2030   PROTEINUR NEGATIVE 04/16/2021 2030   UROBILINOGEN 0.2 05/01/2008 1735   NITRITE NEGATIVE 04/16/2021 2030   LEUKOCYTESUR NEGATIVE 04/16/2021 2030    Sepsis Labs: Lactic Acid, Venous    Component Value Date/Time   LATICACIDVEN 1.2 04/19/2021 1943    MICROBIOLOGY: No results found for this or any previous visit (from the past 240 hour(s)).  RADIOLOGY STUDIES/RESULTS: No results found.   LOS: 16 days   Oren Binet, MD  [facility]    To contact the attending provider between 7A-7P or the covering provider during after hours 7P-7A, please log into the  web site [email or web address] and access using universal [place] password for that web site. If you do not have the password, please call the hospital operator.  05/02/2021, 1:53 PM

## 2021-05-03 DIAGNOSIS — E785 Hyperlipidemia, unspecified: Secondary | ICD-10-CM

## 2021-05-03 LAB — CBC
HCT: 28.8 % — ABNORMAL LOW (ref 39.0–52.0)
Hemoglobin: 9.3 g/dL — ABNORMAL LOW (ref 13.0–17.0)
MCH: 32.5 pg (ref 26.0–34.0)
MCHC: 32.3 g/dL (ref 30.0–36.0)
MCV: 100.7 fL — ABNORMAL HIGH (ref 80.0–100.0)
Platelets: 158 10*3/uL (ref 150–400)
RBC: 2.86 MIL/uL — ABNORMAL LOW (ref 4.22–5.81)
RDW: 17 % — ABNORMAL HIGH (ref 11.5–15.5)
WBC: 7.6 10*3/uL (ref 4.0–10.5)
nRBC: 0 % (ref 0.0–0.2)

## 2021-05-03 LAB — HEPARIN LEVEL (UNFRACTIONATED): Heparin Unfractionated: 0.35 IU/mL (ref 0.30–0.70)

## 2021-05-03 LAB — GLUCOSE, CAPILLARY
Glucose-Capillary: 106 mg/dL — ABNORMAL HIGH (ref 70–99)
Glucose-Capillary: 133 mg/dL — ABNORMAL HIGH (ref 70–99)
Glucose-Capillary: 178 mg/dL — ABNORMAL HIGH (ref 70–99)
Glucose-Capillary: 90 mg/dL (ref 70–99)

## 2021-05-03 MED ORDER — INSULIN GLARGINE 100 UNIT/ML SOLOSTAR PEN: 22.0000 [IU] | PEN_INJECTOR | Freq: Every day | SUBCUTANEOUS | 11 refills | Status: AC

## 2021-05-03 MED ORDER — AMLODIPINE BESYLATE 5 MG PO TABS: 5.0000 mg | ORAL_TABLET | Freq: Every day | ORAL | 3 refills | Status: DC

## 2021-05-03 MED ORDER — METFORMIN HCL 500 MG PO TABS: 500.0000 mg | ORAL_TABLET | Freq: Two times a day (BID) | ORAL | 11 refills | Status: AC

## 2021-05-03 MED ORDER — INSULIN PEN NEEDLE 32G X 4 MM MISC: 1.0000 | 0 refills | Status: AC | PRN

## 2021-05-03 MED ORDER — METOPROLOL SUCCINATE ER 50 MG PO TB24: 50.0000 mg | ORAL_TABLET | Freq: Every day | ORAL | 11 refills | Status: DC

## 2021-05-03 MED ORDER — BLOOD GLUCOSE METER KIT: PACK | 0 refills | Status: DC

## 2021-05-03 MED ORDER — APIXABAN 5 MG PO TABS
5.0000 mg | ORAL_TABLET | Freq: Two times a day (BID) | ORAL | Status: DC
  Administered 2021-05-03: 5 mg via ORAL
  Filled 2021-05-03: qty 1

## 2021-05-03 MED ORDER — APIXABAN 5 MG PO TABS: 5.0000 mg | ORAL_TABLET | Freq: Two times a day (BID) | ORAL | 11 refills | Status: DC

## 2021-05-03 NOTE — Discharge Summary (Signed)
PATIENT DETAILS Name: [name] Age: 66 y.o. Sex: male Date of Birth: [birth year] MRN: [id number]. Admitting Physician: Thurnell Lose, MD [id number], Christean Grief, MD  Admit Date: 04/16/2021 Discharge date: 05/03/2021  Recommendations for Outpatient Follow-up:  Follow up with PCP in 1-2 weeks Please obtain CMP/CBC in one week Please follow up with general surgery.  Admitted From:  Home  Disposition: Home health   Discharge Condition: good  CODE STATUS:   Code Status: Full Code   Diet recommendation:  Diet Order             Diet - low sodium heart healthy           DIET SOFT Room service appropriate? Yes; Fluid consistency: Thin  Diet effective now                    Brief Summary: 66 year old male with history of HTN, CAD-who presented with symptomatic hyperglycemia-was found to have DKA-subsequently stabilized with IVF, IV insulin-subsequent hospital course complicated by sudden abdominal pain-found to have ischemic bowel and underwent laparotomy.  Post surgery-patient developed ileus and bright red blood per stools.  See below for further details.  Significant Hospital events: 1/19>> admit for DKA 1/22>> sudden abdominal pain--ischemia of small bowel. 2/01>> lower GI bleeding 2/02>> Heparin infusion discontinued 2/04>> no lower GI bleeding x2 days-IV heparin restarted  Significant imaging studies: 1/22>> CT abdomen/pelvis: Distal ileum with adjacent inflammation/small amount of free fluid. 1/22>> CT angio abdomen: No aneurysm/dissection of abdominal aorta-moderate mixed calcific atherosclerosis without high-grade stenosis of branch vessels. 1/29>> CT abdomen/pelvis: Distal small bowel obstruction with transition point near the ileocecal anastomosis. 2/01>> Echo: EF 55-60%  Brief Hospital Course: * DKA (diabetic ketoacidosis) ([place])- (present on admission) DKA has resolved-CBGs stable on SQ insulin.  Newly diagnosed DM (diabetes mellitus), type 2 (A1c>  15.5 on 1/22) CBG stable-continue 22 units of Semglee daily on discharge, will add metformin on discharge.  PCP to optimize further. 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  Finally had a BM x2 earlier this morning.  General surgery will arrange for outpatient follow-up.  Postoperative ileus Resolved-tolerating diet and having BMs.  Lower GI bleed Suspicion for slow bleeding at the staple/incision at the anastomotic site-no further lower GI bleeding since heparin drip was discontinued on 2/2.  Hemoglobin stable after 1 unit of PRBC transfusion on 2/2.  Heparin infusion was resumed on 2/4-patient had a BM without any bleeding on 2/5-will be transitioned to Eliquis  Thrombocytopenia (HCC) Improving-watch closely.  Normocytic anemia Multifactorial-initially felt to be due to acute illness-but now due to acute blood loss in the setting of lower GI bleeding.  Hemoglobin stable after 1 unit of PRBC transfusion on 2/2.  Acute renal failure (ARF) ([facility])- (present on admission) Resolved-AKI was likely hemodynamically mediated.  Follow electrolytes periodically.  Paroxysmal atrial fibrillation ([place])- (present on admission) Maintaining sinus rhythm-on beta-blocker.  Anticoagulation held x2 days to allow GI bleeding to resolve-subsequently started heparin on 2/4-since no bleeding-transitioning to Eliquis on 2/5.  Hypertension- (present on admission) BP stable-but creeping up-resume beta-blocker-add low-dose amlodipine on discharge.  Further optimization deferred to PCP.  Dyslipidemia- (present on admission) Continue statin and Vascepa  Hypomagnesemia Repleted.  Gout Continue allopurinol.  Nutrition Status: Nutrition Problem:  Inadequate oral intake Etiology:  poor appetite Signs/Symptoms: per patient/family report Interventions: Glucerna shake, Education   BMI: Estimated body mass index is 24.41 kg/m as calculated from the following:   Height as of this encounter: 6' (1.829 m).   Weight as of this encounter: 81.6 kg.   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-s/p exploratory laparotomy on 1/22   Lower GI bleed   Postoperative ileus   Acute renal failure (ARF) (HCC)   Normocytic anemia   Thrombocytopenia (HCC)   Paroxysmal atrial fibrillation ([place])   Hypertension   Dyslipidemia   Hypomagnesemia   Gout   DKA, type 2 ([place])   Discharge Instructions:  Activity:  As tolerated with Full fall precautions use walker/cane & assistance as needed   Discharge Instructions     Amb Referral to Nutrition and Diabetic Education   Complete by: As directed    Call MD for:  redness, tenderness, or signs of infection (pain, swelling, redness, odor or [name]/yellow discharge around incision site)   Complete by: As directed    Call MD for:  severe uncontrolled pain   Complete by: As directed    Diet - low sodium heart healthy   Complete by: As directed    Discharge instructions   Complete by: As directed    Follow with Primary MD  [name], MD in 1-2 weeks  Please get a complete blood count and chemistry panel checked by your Primary MD at your next visit, and again as instructed by your Primary MD.  Get Medicines reviewed and adjusted: Please take all your medications with you for your next visit with your Primary MD  Laboratory/radiological data: Please request your Primary MD to go over all hospital tests and procedure/radiological results at the follow up, please ask your  Primary MD to get all Hospital records sent to his/her office.  In some cases, they will be blood work, cultures and biopsy results pending at the time of your discharge. Please request that your primary care M.D. follows up on these results.  Also Note the following: If you experience worsening of your admission symptoms, develop shortness of breath, life threatening emergency, suicidal or homicidal thoughts you must seek medical attention immediately by calling 911 or calling your MD immediately  if symptoms less severe.  You must read complete instructions/literature along with all the possible adverse reactions/side effects for all the Medicines you take and that have been prescribed to you. Take any new Medicines after you have completely understood and accpet all the possible adverse reactions/side effects.   Do not drive when taking Pain medications or sleeping medications (Benzodaizepines)  Do not take more than prescribed Pain, Sleep and Anxiety Medications. It is not advisable to combine anxiety,sleep and pain medications without talking with your primary care practitioner  Special Instructions: If you have smoked or chewed Tobacco  in the last 2 yrs please stop smoking, stop any regular Alcohol  and or any Recreational drug use.  Wear Seat belts while driving.  Please note: You were cared for by a hospitalist during your hospital stay. Once you are discharged, your primary care physician will handle any further medical issues. Please note that NO REFILLS for any discharge medications will be authorized once you are discharged, as it is imperative that you return to your primary care physician (  or establish a relationship with a primary care physician if you do not have one) for your post hospital discharge needs so that they can reassess your need for medications and monitor your lab values.   Increase activity slowly   Complete by: As directed    No dressing needed   Complete by: As  directed       Allergies as of 05/03/2021       Reactions   Vicodin [hydrocodone-acetaminophen] Nausea And Vomiting        Medication List     STOP taking these medications    losartan-hydrochlorothiazide 100-12.5 MG tablet Commonly known as: HYZAAR       TAKE these medications    allopurinol 100 MG tablet Commonly known as: ZYLOPRIM Take 100 mg by mouth daily.   amLODipine 5 MG tablet Commonly known as: NORVASC Take 1 tablet (5 mg total) by mouth daily. What changed:  medication strength how much to take   apixaban 5 MG Tabs tablet Commonly known as: Eliquis Take 1 tablet (5 mg total) by mouth 2 (two) times daily. What changed: See the new instructions.   atorvastatin 80 MG tablet Commonly known as: LIPITOR TAKE 1 TABLET BY MOUTH DAILY What changed: when to take this   blood glucose meter kit and supplies Dispense based on patient and insurance preference. Use up to four times daily as directed. (FOR ICD-10 E10.9, E11.9).   busPIRone 5 MG tablet Commonly known as: BUSPAR Take 5 mg by mouth 2 (two) times daily as needed (anxiety).   colchicine 0.6 MG tablet Take 0.6 mg by mouth daily as needed (gout flare).   escitalopram 20 MG tablet Commonly known as: LEXAPRO Take 20 mg by mouth daily.   hydrOXYzine 25 MG tablet Commonly known as: ATARAX Take 25 mg by mouth at bedtime as needed (sleep).   insulin glargine 100 UNIT/ML Solostar Pen Commonly known as: LANTUS Inject 22 Units into the skin daily.   Insulin Pen Needle 32G X 4 MM Misc 1 each by Does not apply route as needed.   metFORMIN 500 MG tablet Commonly known as: Glucophage Take 1 tablet (500 mg total) by mouth 2 (two) times daily with a meal.   metoprolol succinate 50 MG 24 hr tablet Commonly known as: TOPROL-XL Take 1 tablet (50 mg total) by mouth daily. Take with or immediately following a meal. What changed:  medication strength how much to take   nitroGLYCERIN 0.4 MG SL  tablet Commonly known as: NITROSTAT Place 1 tablet (0.4 mg total) under the tongue every 5 (five) minutes as needed. For chest pain What changed:  reasons to take this additional instructions   traZODone 100 MG tablet Commonly known as: DESYREL Take 100 mg by mouth at bedtime.   Vascepa 1 g capsule Generic drug: icosapent Ethyl TAKE 2 CAPSULES(2 GRAMS) BY MOUTH TWICE DAILY What changed: See the new instructions.               Discharge Care Instructions  (From admission, onward)           Start     Ordered   05/03/21 0000  No dressing needed        05/03/21 1038            Follow-up [facility] Follow up in 2 week(s).   Specialty: General Surgery Contact information: [address] [place] [phone]  [name], MD. Schedule an appointment as soon as possible for a visit in 1 week(s).   Specialty: Internal Medicine Contact information: [address] [place] [address] [phone]                Allergies  Allergen Reactions   Vicodin [Hydrocodone-Acetaminophen] Nausea And Vomiting     Other Procedures/Studies: CT ABDOMEN PELVIS WO CONTRAST  Result Date: 04/19/2021 CLINICAL DATA:  66 year old male with acute abdominal and pelvic pain. EXAM: CT ABDOMEN AND PELVIS WITHOUT CONTRAST TECHNIQUE: Multidetector CT imaging of the abdomen and pelvis was performed following the standard protocol without IV contrast. COMPARISON:  05/01/2008 CT FINDINGS: Unenhanced CT was performed per clinician order. Lack of IV contrast limits sensitivity and specificity, especially for evaluation of vascular structures and abdominal/pelvic solid viscera. Lower chest: Mild bilateral LOWER lobe cylindrical bronchiectasis noted with minimal tree in bud opacities in the LEFT LOWER lobe. Coronary artery atherosclerotic calcifications are present. Hepatobiliary: No hepatic or  gallbladder abnormalities are identified. No biliary dilatation noted. Pancreas: Unremarkable Spleen: Unremarkable Adrenals/Urinary Tract: The kidneys, adrenal glands and bladder are unremarkable. Stomach/Bowel: Mildly distended loops of fluid-filled small bowel are noted without transition point. There is mild circumferential wall thickening of the distal ileum to the cecum with adjacent inflammation and small amount of free fluid. No other areas of bowel wall thickening are noted. No evidence of pneumoperitoneum, pneumatosis intestinalis or portal venous gas. The appendix appears normal. Vascular/Lymphatic: Heavy arterial vascular calcifications are noted including aortic atherosclerosis. Heavy SMA and IMA calcifications are identified. No enlarged lymph nodes are identified. Reproductive: Prostate is unremarkable. Other: Small amount of free fluid is noted within the pelvis, RIGHT LOWER quadrant and adjacent to the liver and spleen. Musculoskeletal: No acute or suspicious bony abnormalities are noted. IMPRESSION: 1. Mild circumferential wall thickening of the distal ileum with adjacent inflammation and small amount of free fluid. This is nonspecific but may represent an infectious or inflammatory enteritis. Given very heavy mesenteric arterial calcifications, ischemia is not entirely excluded but no evidence of pneumatosis intestinalis or pneumoperitoneum. Mildly distended loops of fluid-filled small bowel proximally without transition point, which may represent a mild ileus or low-grade partial obstruction at the distal ileum. 2. Mild bilateral LOWER lobe cylindrical bronchiectasis with minimal tree in bud opacities in the LEFT LOWER lobe, likely representing a mild infectious or inflammatory process. 3. Coronary artery disease and aortic Atherosclerosis (ICD10-I70.0). Electronically Signed   By: Margarette Canada M.D.   On: 04/19/2021 11:00   DG Chest 2 View  Result Date: 04/16/2021 CLINICAL DATA:  Tremors,  weakness, short of breath EXAM: CHEST - 2 VIEW COMPARISON:  04/07/2016 FINDINGS: Frontal and lateral views of the chest demonstrate an unremarkable cardiac silhouette. No acute airspace disease, effusion, or pneumothorax. No acute bony abnormalities. IMPRESSION: 1. No acute intrathoracic process. Electronically Signed   By: Randa Ngo M.D.   On: 04/16/2021 21:30   CT ABDOMEN PELVIS W CONTRAST  Result Date: 04/26/2021 CLINICAL DATA:  Abdominal pain. Small-bowel obstruction. Recently postop from ileocecectomy for ischemic bowel. EXAM: CT ABDOMEN AND PELVIS WITH CONTRAST TECHNIQUE: Multidetector CT imaging of the abdomen and pelvis was performed using the standard protocol following bolus administration of intravenous contrast. RADIATION DOSE REDUCTION: This exam was performed according to the departmental dose-optimization program which includes automated exposure control, adjustment of the mA and/or kV according to patient size and/or use of iterative reconstruction technique. CONTRAST:  177m OMNIPAQUE IOHEXOL 300 MG/ML  SOLN COMPARISON:  04/24/2021 FINDINGS: Lower Chest:  No acute findings. Hepatobiliary: No hepatic masses identified. Gallbladder is unremarkable. No evidence of biliary ductal dilatation. Pancreas:  No mass or inflammatory changes. Spleen: Within normal limits in size and appearance. Adrenals/Urinary Tract: No masses identified. Stable small left renal cyst. No evidence of ureteral calculi or hydronephrosis. Stomach/Bowel: Mild wall thickening is again seen involving the ascending colon and ileocolonic anastomosis, without significant change since prior study. Mild to moderately dilated small bowel loops are again seen containing air-fluid levels, with transition point near the ileocolonic anastomosis. This is likely due to partial obstruction from postop edema. No other areas of bowel wall thickening are seen. No evidence of abscess or free intraperitoneal air. Vascular/Lymphatic: No  pathologically enlarged lymph nodes. No acute vascular findings. Aortic atherosclerotic calcification noted. Reproductive:  No mass or other significant abnormality. Other: aMinimal ascites shows mild decrease since previous study. Musculoskeletal:  No suspicious bone lesions identified. IMPRESSION: Similar appearance of distal small bowel obstruction, with transition point near the ileocecal anastomosis. Wall thickening of the ileocolonic anastomosis is also similar in appearance, and likely represents postop edema. Minimal ascites. No evidence of abscess or free intraperitoneal air. Aortic Atherosclerosis (ICD10-I70.0). Electronically Signed   By: Marlaine Hind M.D.   On: 04/26/2021 16:13   CT ABDOMEN PELVIS W CONTRAST  Result Date: 04/24/2021 CLINICAL DATA:  Abdominal pain. EXAM: CT ABDOMEN AND PELVIS WITH CONTRAST TECHNIQUE: Multidetector CT imaging of the abdomen and pelvis was performed using the standard protocol following bolus administration of intravenous contrast. RADIATION DOSE REDUCTION: This exam was performed according to the departmental dose-optimization program which includes automated exposure control, adjustment of the mA and/or kV according to patient size and/or use of iterative reconstruction technique. CONTRAST:  149m OMNIPAQUE IOHEXOL 300 MG/ML  SOLN COMPARISON:  CTA abdomen and pelvis 04/19/2021 FINDINGS: Lower chest: Left lower lobe posterior basal infiltrates have partially resolved in the interval. Irregular ground-glass opacity is noted in the lateral base of the right upper lobe. This is above the plane of both prior studies. There are small left and trace right pleural effusions. Other lung bases are clear. There is mild cardiomegaly with heavy calcification in the coronary arteries. Hepatobiliary: No mass is seen. Gallbladder and bile ducts are unremarkable. Pancreas: No mass or ductal dilatation. Scattered punctate calcifications of chronic pancreatitis. No evidence of acute  pancreatitis. Spleen: Normal. Adrenals/Urinary Tract: Mild nodular thickening left adrenal gland. Right adrenal is unremarkable. The renal cortex unremarkable aside from a 2.6 cm cyst in the posterior left kidney. There are renovascular calcifications about the right renal hilum but no evidence of urinary stones or obstruction. Unremarkable bladder. Stomach/Bowel: Interval laparotomy with overlying skin staples. Subincisional surgical drain noted in the midline. Interval removal of the proximal ascending colon with end to side ileocolic surgical anastomosis. The anastomotic segment is fairly decompressed, but there is a caliber change from decompressed to dilated caliber in the anterior left mid to lower abdomen off the midline, 20-23 cm proximal to the surgical ileocolic junction, where there is a sharply angulated small bowel loop on axial images 53-56 of series 3, and coronal reconstruction image 24 of series 7. Above this level there is small bowel dilatation up to 4 cm, with backup into the proximal small bowel and gastric fluid distention. This is concerning for small bowel obstruction due to adhesive disease or postsurgical kinking. Some of the left mid abdominal small bowel segments posteriorly are thick-walled but this seems to have been present previously such as due to a chronic enteritis or  enteropathy. No bowel pneumatosis or portal venous gas is evident. Large intestine is decompressed without wall thickening with uncomplicated scattered diverticula. Vascular/Lymphatic: There are heavy vascular calcifications. This includes at the SMA origin and mid to distal vessel past the inflection point with stenting of the right renal artery again shown and moderate aortoiliac mixed plaques. There is no AAA. Reproductive: No prostatomegaly. Other: There is mild body wall anasarca, mesenteric edema which could be postsurgical such as due to fluid overload or due to peritonitis. There is mild abdominal and pelvic  free ascites. There is trace free intraperitoneal air in the mid abdomen underlying the incision. No abscess is seen. Mild scattered fluid in the mesenteric folds is also noted. No incarcerated hernia. Musculoskeletal: There are degenerative changes of the spine. No suspicious bone lesions IMPRESSION: 1. Interval removal of the cecum and presumably the terminal ileum which previously showed inflammatory change and wall thickening. Small part of the proximal ascending colon was also removed. 2. 20-23 cm proximal to the surgical ileocolic junction in the anterior mid/lower abdomen off midline there is a sharply angulated small bowel segment, above which the small bowel is diffusely dilated up to 4 cm with backed up fluid distending the stomach. This is concerning for small bowel obstruction due to postsurgical adhesions or postsurgical kinking. 3. Some of the more proximal small bowel segments show fold thickening but this does appear to have been present previously. Superimposed enteritis or ischemic wall edema is not strictly excluded. No pneumatosis or portal venous gas. 4. Mild abdominal and pelvic free fluid. Only trace free air, probably related to the surgery. 5. Heavy vascular calcifications, including the SMA. 6. Patchy mesenteric edema and body wall anasarca, both probably due to fluid overload and/or postsurgical mesenteric edema. Underlying peritonitis not strictly excluded. 7. Left lower lobe posterior basal infiltrates partially cleared. Small left and trace right pleural effusions. 8. Small irregular ground-glass opacity, lateral base of the right upper lobe, could be atelectasis or pneumonitis. Follow-up recommended. Electronically Signed   By: Telford Nab M.D.   On: 04/24/2021 04:08   DG Chest Port 1 View  Result Date: 04/18/2021 CLINICAL DATA:  Shortness of breath. EXAM: PORTABLE CHEST 1 VIEW COMPARISON:  04/16/2021 FINDINGS: The heart size and mediastinal contours are within normal limits.  Both lungs are clear. The visualized skeletal structures are unremarkable. IMPRESSION: No active disease. Electronically Signed   By: Kerby Moors M.D.   On: 04/18/2021 08:21   DG Abd Portable 1V  Result Date: 04/25/2021 CLINICAL DATA:  Abdominal distension.  Post GI surgery. EXAM: PORTABLE ABDOMEN - 1 VIEW COMPARISON:  01/11/2016.  CT, 04/24/2021. FINDINGS: Multiple loops of distended small bowel are noted throughout the central abdomen with minimal air noted in the colon. Skin staples extend along the low anterior abdominal midline. Bowel anastomosis staples are noted in the right lower quadrant. IMPRESSION: 1. Multiple loops of dilated small bowel with minimal colonic air. Findings are consistent with a partial small bowel obstruction, moderate to high-grade. Findings are also similar to the previous day's CT scan. Electronically Signed   By: Lajean Manes M.D.   On: 04/25/2021 10:50   ECHOCARDIOGRAM COMPLETE  Result Date: 04/29/2021    ECHOCARDIOGRAM REPORT   Patient Name:   JENIEL SLAUSON Date of Exam: 04/29/2021 Medical Rec #:  [id number]     Height:       72.0 in Accession #:    [id number]    Weight:       180.0  lb Date of Birth:  Jul 13, 1955    BSA:          2.037 m Patient Age:    64 years      BP:           140/61 mmHg Patient Gender: M             HR:           63 bpm. Exam Location:  Inpatient Procedure: 2D Echo, Cardiac Doppler and Color Doppler Indications:    I48.91 AFIB  History:        Patient has prior history of Echocardiogram examinations, most                 recent 09/23/2020. Previous Myocardial Infarction and CAD, Stroke                 and TIA; Risk Factors:Hypertension and Dyslipidemia. DKA /                 DYSRHYTHMIA / PAD.  Sonographer:    Beryle Beams Referring Phys: [place]  1. Left ventricular ejection fraction, by estimation, is 55 to 60%. The left ventricle has normal function. The left ventricle has no regional wall motion abnormalities. Left  ventricular diastolic parameters are consistent with Grade I diastolic dysfunction (impaired relaxation).  2. Right ventricular systolic function is normal. The right ventricular size is normal.  3. The mitral valve is normal in structure. Trivial mitral valve regurgitation. No evidence of mitral stenosis.  4. The aortic valve is normal in structure. Aortic valve regurgitation is not visualized. Aortic valve sclerosis is present, with no evidence of aortic valve stenosis.  5. The inferior vena cava is normal in size with greater than 50% respiratory variability, suggesting right atrial pressure of 3 mmHg. FINDINGS  Left Ventricle: Left ventricular ejection fraction, by estimation, is 55 to 60%. The left ventricle has normal function. The left ventricle has no regional wall motion abnormalities. The left ventricular internal cavity size was normal in size. There is  no left ventricular hypertrophy. Left ventricular diastolic parameters are consistent with Grade I diastolic dysfunction (impaired relaxation). Right Ventricle: The right ventricular size is normal. No increase in right ventricular wall thickness. Right ventricular systolic function is normal. Left Atrium: Left atrial size was normal in size. Right Atrium: Right atrial size was normal in size. Pericardium: There is no evidence of pericardial effusion. Presence of epicardial fat layer. Mitral Valve: The mitral valve is normal in structure. Trivial mitral valve regurgitation. No evidence of mitral valve stenosis. Tricuspid Valve: The tricuspid valve is normal in structure. Tricuspid valve regurgitation is not demonstrated. No evidence of tricuspid stenosis. Aortic Valve: The aortic valve is normal in structure. Aortic valve regurgitation is not visualized. Aortic valve sclerosis is present, with no evidence of aortic valve stenosis. Aortic valve mean gradient measures 5.0 mmHg. Aortic valve peak gradient measures 9.7 mmHg. Aortic valve area, by VTI measures  2.36 cm. Pulmonic Valve: The pulmonic valve was normal in structure. Pulmonic valve regurgitation is not visualized. No evidence of pulmonic stenosis. Aorta: The aortic root is normal in size and structure. Venous: The inferior vena cava is normal in size with greater than 50% respiratory variability, suggesting right atrial pressure of 3 mmHg. IAS/Shunts: No atrial level shunt detected by color flow Doppler.  LEFT VENTRICLE PLAX 2D LVIDd:         5.00 cm     Diastology LVIDs:  2.30 cm     LV e' medial:    7.94 cm/s LV PW:         1.30 cm     LV E/e' medial:  12.5 LV IVS:        1.00 cm     LV e' lateral:   9.36 cm/s LVOT diam:     2.10 cm     LV E/e' lateral: 10.6 LV SV:         73 LV SV Index:   36 LVOT Area:     3.46 cm  LV Volumes (MOD) LV vol d, MOD A2C: 88.2 ml LV vol d, MOD A4C: 91.0 ml LV vol s, MOD A2C: 40.1 ml LV vol s, MOD A4C: 38.5 ml LV SV MOD A2C:     48.1 ml LV SV MOD A4C:     91.0 ml LV SV MOD BP:      49.4 ml RIGHT VENTRICLE             IVC RV S prime:     14.60 cm/s  IVC diam: 1.90 cm TAPSE (M-mode): 2.8 cm LEFT ATRIUM             Index        RIGHT ATRIUM           Index LA diam:        3.60 cm 1.77 cm/m   RA Area:     15.70 cm LA Vol (A2C):   43.4 ml 21.30 ml/m  RA Volume:   42.30 ml  20.76 ml/m LA Vol (A4C):   46.7 ml 22.92 ml/m LA Biplane Vol: 45.5 ml 22.33 ml/m  AORTIC VALVE                     PULMONIC VALVE AV Area (Vmax):    2.15 cm      PV Vmax:       0.74 m/s AV Area (Vmean):   2.14 cm      PV Vmean:      46.000 cm/s AV Area (VTI):     2.36 cm      PV VTI:        0.133 m AV Vmax:           156.00 cm/s   PV Peak grad:  2.2 mmHg AV Vmean:          103.000 cm/s  PV Mean grad:  1.0 mmHg AV VTI:            0.311 m AV Peak Grad:      9.7 mmHg AV Mean Grad:      5.0 mmHg LVOT Vmax:         96.70 cm/s LVOT Vmean:        63.700 cm/s LVOT VTI:          0.212 m LVOT/AV VTI ratio: 0.68  AORTA Ao Root diam: 3.10 cm Ao Asc diam:  2.90 cm MITRAL VALVE                TRICUSPID VALVE MV  Area (PHT): 2.48 cm     TV Peak grad:   23.4 mmHg MV Decel Time: 306 msec     TV Mean grad:   17.0 mmHg MV E velocity: 99.20 cm/s   TV Vmax:        2.42 m/s MV A velocity: 122.00 cm/s  TV Vmean:       198.0 cm/s MV E/A ratio:  0.81         TV VTI:         0.78 msec                              SHUNTS                             Systemic VTI:  0.21 m                             Systemic Diam: 2.10 cm Kardie Tobb DO Electronically signed by Berniece Salines DO Signature Date/Time: 04/29/2021/6:00:58 PM    Final    [place] EKG SITE RITE  Result Date: 04/24/2021 If Site Rite image not attached, placement could not be confirmed due to current cardiac rhythm.  CT Angio Abd/Pel w/ and/or w/o  Result Date: 04/19/2021 CLINICAL DATA:  Mesenteric ischemia EXAM: CTA ABDOMEN AND PELVIS WITHOUT AND WITH CONTRAST TECHNIQUE: Multidetector CT imaging of the abdomen and pelvis was performed using the standard protocol during bolus administration of intravenous contrast. Multiplanar reconstructed images and MIPs were obtained and reviewed to evaluate the vascular anatomy. RADIATION DOSE REDUCTION: This exam was performed according to the departmental dose-optimization program which includes automated exposure control, adjustment of the mA and/or kV according to patient size and/or use of iterative reconstruction technique. CONTRAST:  171m OMNIPAQUE IOHEXOL 350 MG/ML SOLN COMPARISON:  04/19/2021 FINDINGS: VASCULAR Normal contour and caliber of the abdominal aorta. No evidence of aneurysm, dissection, or other acute aortic pathology. Duplicated right renal arteries, with a small accessory inferior pole artery and a stent of the dominant main renal artery. Solitary left renal artery. Otherwise standard branching pattern of the abdominal aorta. Moderate mixed calcific atherosclerosis. Mixed atherosclerosis of the branch vessel origins as well as the first and second order branch vessels of the superior mesenteric artery without high-grade  stenosis. Review of the MIP images confirms the above findings. NON-VASCULAR Lower chest: Coronary artery calcifications. Scattered centrilobular nodular and ground-glass opacity of the left lung base (series 8, image 18). Hepatobiliary: No solid liver abnormality is seen. Hepatic steatosis. No gallstones, gallbladder wall thickening, or biliary dilatation. Pancreas: Unremarkable. No pancreatic ductal dilatation or surrounding inflammatory changes. Spleen: Normal in size without significant abnormality. Adrenals/Urinary Tract: Adrenal glands are unremarkable. Kidneys are normal, without renal calculi, solid lesion, or hydronephrosis. Bladder is unremarkable. Stomach/Bowel: Stomach is within normal limits. Appendix appears normal. Wall thickening and adjacent inflammatory fat stranding of the cecal base and terminal ileum, involving approximately 20 cm of the terminal ileum (series 11, image 71). Lymphatic: No enlarged abdominal or pelvic lymph nodes. Reproductive: No mass or other significant abnormality. Other: No abdominal wall hernia or abnormality. Small volume perisplenic and pelvic ascites. Musculoskeletal: No acute osseous findings. IMPRESSION: 1. Normal contour and caliber of the abdominal aorta. No evidence of aneurysm, dissection, or other acute aortic pathology. 2. Moderate mixed calcific atherosclerosis without high-grade stenosis of the branch vessel origins as well as the first and second order branch vessels of the superior mesenteric artery without occlusion or high-grade stenosis. 3. Stent at the origin of the dominant right renal artery. 4. Wall thickening and adjacent inflammatory fat stranding of the cecal base and terminal ileum, involving approximately 20 cm of the terminal ileum, consistent with nonspecific infectious or inflammatory terminal ileitis, which may be of infectious, inflammatory, or  ischemic etiology. Crohn's disease is a substantial differential consideration given this  appearance and distribution. 5. Scattered centrilobular nodular and ground-glass opacity of the left lung base, consistent with nonspecific infectious or aspiration. 6. Hepatic steatosis. 7. Small volume ascites. Electronically Signed   By: Delanna Ahmadi M.D.   On: 04/19/2021 18:46     TODAY-DAY OF DISCHARGE:  Subjective:   [name] today has no headache,no chest abdominal pain,no new weakness tingling or numbness, feels much better wants to go home today.  Objective:   Blood pressure (!) 151/98, pulse 61, temperature 97.8 F (36.6 C), temperature source Oral, resp. rate 18, height 6' (1.829 m), weight 81.6 kg, SpO2 98 %.  Intake/Output Summary (Last 24 hours) at 05/03/2021 1039 Last data filed at 05/03/2021 0811 Gross per 24 hour  Intake 100 ml  Output 3525 ml  Net -3425 ml   Filed Weights   04/16/21 1835  Weight: 81.6 kg    Exam: Awake Alert, Oriented *3, No new F.N deficits, Normal affect [place].AT,PERRAL Supple Neck,No JVD, No cervical lymphadenopathy appriciated.  Symmetrical Chest wall movement, Good air movement bilaterally, CTAB RRR,No Gallops,Rubs or new Murmurs, No Parasternal Heave +ve B.Sounds, Abd Soft, Non tender, No organomegaly appriciated, No rebound -guarding or rigidity. No Cyanosis, Clubbing or edema, No new Rash or bruise   PERTINENT RADIOLOGIC STUDIES: No results found.   PERTINENT LAB RESULTS: CBC: Recent Labs    05/02/21 0355 05/03/21 0342  WBC 7.7 7.6  HGB 9.1* 9.3*  HCT 28.0* 28.8*  PLT 151 158   CMET CMP     Component Value Date/Time   NA 138 04/30/2021 0415   NA 141 07/11/2019 0812   K 4.3 04/30/2021 0415   CL 107 04/30/2021 0415   CO2 24 04/30/2021 0415   GLUCOSE 132 (H) 04/30/2021 0415   BUN 9 04/30/2021 0415   BUN 15 07/11/2019 0812   CREATININE 1.01 04/30/2021 0415   CREATININE 1.25 10/09/2020 1556   CALCIUM 7.9 (L) 04/30/2021 0415   PROT 4.4 (L) 04/30/2021 0415   PROT 6.7 07/11/2019 0811   ALBUMIN 1.9 (L) 04/30/2021 0415    ALBUMIN 4.1 07/11/2019 0811   AST 25 04/30/2021 0415   ALT 23 04/30/2021 0415   ALKPHOS 72 04/30/2021 0415   BILITOT 0.4 04/30/2021 0415   BILITOT 0.3 07/11/2019 0811   GFRNONAA >60 04/30/2021 0415   GFRAA 87 07/11/2019 0812    GFR Estimated Creatinine Clearance: 79 mL/min (by C-G formula based on SCr of 1.01 mg/dL). No results for input(s): LIPASE, AMYLASE in the last 72 hours. No results for input(s): CKTOTAL, CKMB, CKMBINDEX, TROPONINI in the last 72 hours. Invalid input(s): POCBNP No results for input(s): DDIMER in the last 72 hours. No results for input(s): HGBA1C in the last 72 hours. No results for input(s): CHOL, HDL, LDLCALC, TRIG, CHOLHDL, LDLDIRECT in the last 72 hours. No results for input(s): TSH, T4TOTAL, T3FREE, THYROIDAB in the last 72 hours.  Invalid input(s): FREET3 No results for input(s): VITAMINB12, FOLATE, FERRITIN, TIBC, IRON, RETICCTPCT in the last 72 hours. Coags: No results for input(s): INR in the last 72 hours.  Invalid input(s): PT Microbiology: No results found for this or any previous visit (from the past 240 hour(s)).  FURTHER DISCHARGE INSTRUCTIONS:  Get Medicines reviewed and adjusted: Please take all your medications with you for your next visit with your Primary MD  Laboratory/radiological data: Please request your Primary MD to go over all hospital tests and procedure/radiological results at the follow up,  please ask your Primary MD to get all Hospital records sent to his/her office.  In some cases, they will be blood work, cultures and biopsy results pending at the time of your discharge. Please request that your primary care M.D. goes through all the records of your hospital data and follows up on these results.  Also Note the following: If you experience worsening of your admission symptoms, develop shortness of breath, life threatening emergency, suicidal or homicidal thoughts you must seek medical attention immediately by calling  911 or calling your MD immediately  if symptoms less severe.  You must read complete instructions/literature along with all the possible adverse reactions/side effects for all the Medicines you take and that have been prescribed to you. Take any new Medicines after you have completely understood and accpet all the possible adverse reactions/side effects.   Do not drive when taking Pain medications or sleeping medications (Benzodaizepines)  Do not take more than prescribed Pain, Sleep and Anxiety Medications. It is not advisable to combine anxiety,sleep and pain medications without talking with your primary care practitioner  Special Instructions: If you have smoked or chewed Tobacco  in the last 2 yrs please stop smoking, stop any regular Alcohol  and or any Recreational drug use.  Wear Seat belts while driving.  Please note: You were cared for by a hospitalist during your hospital stay. Once you are discharged, your primary care physician will handle any further medical issues. Please note that NO REFILLS for any discharge medications will be authorized once you are discharged, as it is imperative that you return to your primary care physician (or establish a relationship with a primary care physician if you do not have one) for your post hospital discharge needs so that they can reassess your need for medications and monitor your lab values.  Total Time spent coordinating discharge including counseling, education and face to face time equals greater than 30 minutes.  SignedOren Binet 05/03/2021 10:39 AM

## 2021-05-03 NOTE — Progress Notes (Signed)
14 Days Post-Op   Subjective/Chief Complaint: Large BM today, remains without any evident gross blood - brown semi liquid stool. Tolerating SOFT diet. Having flatus. No abdominal pain. Hgb stable at 9.3 on heparin gtt   Objective: Vital signs in last 24 hours: Temp:  [97.8 F (36.6 C)-98.3 F (36.8 C)] 97.8 F (36.6 C) (02/05 0811) Pulse Rate:  [57-62] 61 (02/05 0811) Resp:  [14-18] 18 (02/05 0811) BP: (90-151)/(63-98) 151/98 (02/05 0811) SpO2:  [92 %-98 %] 98 % (02/05 0811) Last BM Date: 04/29/21  Intake/Output from previous day: 02/04 0701 - 02/05 0700 In: 340 [P.O.:340] Out: 3475 [Urine:3475] Intake/Output this shift: Total I/O In: -  Out: 275 [Urine:275]  Gen:  Alert, NAD, pleasant Abd: Abdomen soft, mild distention, non-tender Midline wound with staples in place and no signs of infection.  Psych: A&Ox3  Skin: no rashes noted, warm and dry  Lab Results:  Recent Labs    05/02/21 0355 05/03/21 0342  WBC 7.7 7.6  HGB 9.1* 9.3*  HCT 28.0* 28.8*  PLT 151 158   BMET No results for input(s): NA, K, CL, CO2, GLUCOSE, BUN, CREATININE, CALCIUM in the last 72 hours.  PT/INR No results for input(s): LABPROT, INR in the last 72 hours. ABG No results for input(s): PHART, HCO3 in the last 72 hours.  Invalid input(s): PCO2, PO2  Studies/Results: No results found.  Anti-infectives: Anti-infectives (From admission, onward)    Start     Dose/Rate Route Frequency Ordered Stop   04/20/21 0200  piperacillin-tazobactam (ZOSYN) IVPB 3.375 g  Status:  Discontinued        3.375 g 12.5 mL/hr over 240 Minutes Intravenous Every 8 hours 04/19/21 1649 04/20/21 0105   04/20/21 0200  piperacillin-tazobactam (ZOSYN) IVPB 3.375 g        3.375 g 12.5 mL/hr over 240 Minutes Intravenous Every 8 hours 04/20/21 0105 04/24/21 0314   04/20/21 0000  piperacillin-tazobactam (ZOSYN) IVPB 3.375 g  Status:  Discontinued        3.375 g 100 mL/hr over 30 Minutes Intravenous Every 6 hours  04/19/21 2334 04/20/21 0105   04/19/21 1745  piperacillin-tazobactam (ZOSYN) IVPB 3.375 g        3.375 g 100 mL/hr over 30 Minutes Intravenous  Once 04/19/21 1649 04/19/21 1910       Assessment/Plan: POD 14 s/p exploratory laparotomy with ileocecectomy for 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 Thermon Leyland 1/22 -No further bleeding; ok for staples out - placed nursing order  - Ok to resume Eliquis after stopping heparin - SOFT diet. -Will plan to d/c staples prior to discharge. No home health needs predicted. -Follow-up in [facility] [place] in ~2 weeks. I placed in avs and sent our clinical pool a staff message to arrange   FEN: SOFT.   ID: [email or web address] 1/22 - 1/26 VTE: hep gtt on hold due to hematochezia    Per primary: T2DM with DKA AKI Afib on eliquis baseline HTN  LOS: 17 days    [name] 05/03/2021

## 2021-05-03 NOTE — Progress Notes (Addendum)
ANTICOAGULATION CONSULT NOTE - Follow Up Consult  Pharmacy Consult for Heparin Indication: atrial fibrillation  Allergies  Allergen Reactions   Vicodin [Hydrocodone-Acetaminophen] Nausea And Vomiting    Patient Measurements: Height: 6' (182.9 cm) Weight: 81.6 kg (180 lb) IBW/kg (Calculated) : 77.6 Heparin Dosing Weight:  77.6 kg  Vital Signs: Temp: 98.1 F (36.7 C) (02/05 0424) Temp Source: Oral (02/05 0424) BP: 137/80 (02/05 0424) Pulse Rate: 57 (02/05 0424)  Labs: Recent Labs    05/01/21 0451 05/02/21 0355 05/03/21 0342  HGB 9.4* 9.1* 9.3*  HCT 28.1* 28.0* 28.8*  PLT 146* 151 158  HEPARINUNFRC  --   --  0.35     Estimated Creatinine Clearance: 79 mL/min (by C-G formula based on SCr of 1.01 mg/dL).   Assessment: 66 yo M presented for DKA on 1/19. PMH significant for afib on PTA Apixaban 5 mg, LD 1/22 AM. 01/22 KCentra given (~50 units/kg x 1) for urgent OR. Heparin started 1/23 per pharmacy. Bloody BM 1/27 > heparin held, then resumed. Heparin held again on 2/2 due to bloody BM. Pharmacy consulted to restart IV heparin 2/4 with lower therapeutic goal.   Heparin level: 0.35, therapeutic CBC stable. NT notes no bloody in BM this AM.  Goal of Therapy:  Heparin level 0.3-0.5 units/ml Monitor platelets by anticoagulation protocol: Yes   Plan:  Continue heparin drip at 1250 units/hr Monitor for s/s of bleeding including bloody BM 8h Heparin level  Daily HL and CBC. F/u ability to transition to PTA apixaban   Thank you for allowing pharmacy to participate in this patient's care.  Levonne Spiller, PharmD PGY1 Acute Care Resident  05/03/2021,7:16 AM   _________________________________________________  Addendum:   Heparin IV DC and restart home apixaban.  Thank you for allowing pharmacy to participate in this patient's care.  Levonne Spiller, PharmD PGY1 Acute Care Resident  05/03/2021,9:56 AM

## 2021-05-03 NOTE — Progress Notes (Signed)
Inpatient Diabetes Program Recommendations  AACE/ADA: New Consensus Statement on Inpatient Glycemic Control (2015)  Target Ranges:  Prepandial:   less than 140 mg/dL      Peak postprandial:   less than 180 mg/dL (1-2 hours)      Critically ill patients:  140 - 180 mg/dL   Lab Results  Component Value Date   GLUCAP 133 (H) 05/03/2021   HGBA1C >15.5 (H) 04/19/2021    Received page from Randall Hiss, [place].  Patient is discharging today and does not remember having any education.  Our team has spoke with him several times during this admission regarding DM and insulin teaching.    Spoke with he and his wife on the phone regarding DM management, insulin administration, CHOs, importance of glucose control, impact of exercise on glucose.  Educated him on hypoglycemia, s&s and treatments.  He should check his CBG fasting and a few hours after dinner.  He should take his meter with him to his PCP appointment.  Stressed importance of close f/u with PCP.  Educated him on Metformin.    LWWD was ordered and wife has it at home.  Asked him to [facility] video in insulin pen administration.  Educated patient on insulin pen use at home. Reviewed all steps of insulin pen including attachment of needle, 2-unit air shot, dialing up dose, giving injection, removing needle, disposal of sharps, storage of unused insulin, disposal of insulin etc. MD to give patient Rxs for insulin pens and insulin pen needles.  Educated him on The Plate Method & importance of avoiding beverages with sugar.    Will continue to follow while inpatient.  Thank you, Reche Dixon, MSN, RN Diabetes Coordinator Inpatient Diabetes Program [phone] (team pager from 8a-5p)

## 2021-05-03 NOTE — TOC Transition Note (Signed)
Transition of Care [facility]) - CM/SW Discharge Note   Patient Details  Name: [name] MRN: [id number] Date of Birth: 11/03/1955  [facility] CM/SW Contact:  Carles Collet, RN Phone Number: 05/03/2021, 11:19 AM   Clinical Narrative:    Alvis Lemmings notified of DC today. Patient declines 3/1. Eliquis copay $0 per pharmacy benefit check. Patient will have family transport home.  No other TOC needs identified    Final next level of care: [place] Barriers to Discharge: No Barriers Identified   Patient Goals and CMS Choice Patient states their goals for this hospitalization and ongoing recovery are:: return home CMS Medicare.gov Compare Post Acute Care list provided to:: Patient Choice offered to / list presented to : Patient  Discharge Placement                       Discharge Plan and Services   Discharge Planning Services: CM Consult Post Acute Care Choice: Home Health, Durable Medical Equipment          DME Arranged: 3-N-1 DME Agency: [employer] Date DME Agency Contacted: 04/21/21 Time DME Agency Contacted: [phone] Representative spoke with at DME Agency: Freda Munro HH Arranged: PT, OT [facility] Agency: [place] Date [place]: 05/03/21 Time [place]: 1118 Representative spoke with at [place]: [place] ([place]) Interventions     Readmission Risk Interventions No flowsheet data found.

## 2021-05-03 NOTE — Progress Notes (Signed)
Moved Heparin IV to PIV from PICC line so IV team can draw from the PICC.

## 2021-05-05 ENCOUNTER — Other Ambulatory Visit: Payer: Self-pay

## 2021-05-05 ENCOUNTER — Encounter: Payer: Self-pay | Admitting: Dietician

## 2021-05-05 ENCOUNTER — Encounter: Payer: Medicare Other | Attending: Internal Medicine | Admitting: Dietician

## 2021-05-05 DIAGNOSIS — Z794 Long term (current) use of insulin: Secondary | ICD-10-CM | POA: Insufficient documentation

## 2021-05-05 DIAGNOSIS — Z7984 Long term (current) use of oral hypoglycemic drugs: Secondary | ICD-10-CM | POA: Insufficient documentation

## 2021-05-05 DIAGNOSIS — E119 Type 2 diabetes mellitus without complications: Secondary | ICD-10-CM | POA: Diagnosis not present

## 2021-05-05 DIAGNOSIS — E1169 Type 2 diabetes mellitus with other specified complication: Secondary | ICD-10-CM

## 2021-05-05 NOTE — Progress Notes (Signed)
Diabetes Self-Management Education  Visit Type: First/Initial  Appt. Start Time: 0820 (L) Appt. End Time: 1000  05/08/2021  [name], identified by name and date of birth, is a 66 y.o. male with a diagnosis of Diabetes: Type 2.   ASSESSMENT Patient is here today with his son.  His son drove him to the appointment as his vision is so poor currently.  His wife is home sick with the flu. Depression score during this visit was 18.  No thoughts of harming himself.  Discharge from the hospital 05/03/2021 with DKA.  Glucose currently 124 (fasting) Called patient 05/08/2021 to check on him and left a message for him to call if he had any questions or concerns.  History includes Type 2 diabetes, HTN, HLD, CKD, CVA, MI, CAD, depression, recent GI bleed with surgery. He has not smoked since being in the hospital.  Son and wife both smoke. A1C >15.5% 04/19/21 Medications:  metformin, Lantus (he has not started this as the pharmacy did not give it to him and he is not sure how to give it) Pharmacy called re:  Lantus and it is now ready for pick up.  Patient instructed on insulin pen use and is to pick up the prescription after this visit.  169 lbs 04/04/2021  Patient lives with his wife.  His son also lives with them. They share shopping and cooking.  He is retired and used to be a Administrator.  He states that he has a learning disability and has enquired about testing.  He states that he can read but cannot comprehend. [place] and has [place] walking.  Height 5\' 6"  (1.676 m), weight 169 lb (76.7 kg). Body mass index is 27.28 kg/m.   Diabetes Self-Management Education - 05/05/21 0856       Visit Information   Visit Type First/Initial      Initial Visit   Diabetes Type Type 2    Are you currently following a meal plan? No    Are you taking your medications as prescribed? No    Date Diagnosed 03/2021      Health Coping   How would you rate your overall health? Good       Psychosocial Assessment   Patient Belief/Attitude about Diabetes Motivated to manage diabetes    Self-care barriers None    Self-management support Doctor's office    Other persons present Patient;Family Member    Patient Concerns Glycemic Control;Healthy Lifestyle;Nutrition/Meal planning    Special Needs Simplified materials    Preferred Learning Style No preference indicated    Learning Readiness Ready    How often do you need to have someone help you when you read instructions, pamphlets, or other written materials from your doctor or pharmacy? 5 - Always    What is the last grade level you completed in school? 6      Pre-Education Assessment   Patient understands the diabetes disease and treatment process. Needs Instruction    Patient understands incorporating nutritional management into lifestyle. Needs Instruction    Patient undertands incorporating physical activity into lifestyle. Needs Instruction    Patient understands using medications safely. Needs Instruction    Patient understands monitoring blood glucose, interpreting and using results Needs Instruction    Patient understands prevention, detection, and treatment of acute complications. Needs Instruction    Patient understands prevention, detection, and treatment of chronic complications. Needs Instruction    Patient understands how to develop strategies to address  psychosocial issues. Needs Instruction    Patient understands how to develop strategies to promote health/change behavior. Needs Instruction      Complications   Last HgB A1C per patient/outside source 15.5 %   >15.5% 04/19/2021   How often do you check your blood sugar? 0 times/day (not testing)    Fasting Blood glucose range (mg/dL) 70-129    Have you had a dilated eye exam in the past 12 months? No    Have you had a dental exam in the past 12 months? No   no teeth   Are you checking your feet? Yes    How many days per week are you checking your feet? 7       Dietary Intake   Breakfast cereal (frosted mini wheats), whole milk    Snack (morning) none    Lunch grilled chicken sandwich    Snack (afternoon) none    Dinner country ham or steak, cornbread, beans, greens, corn, potatoes    Snack (evening) none    Beverage(s) water, diet Mt. Dew coffee with 1 spoon sugar and creamer      Exercise   Exercise Type Light (walking / raking leaves)    How many days per week to you exercise? 6    How many minutes per day do you exercise? 25    Total minutes per week of exercise 150      Patient Education   Previous Diabetes Education No    Nutrition management  Role of diet in the treatment of diabetes and the relationship between the three main macronutrients and blood glucose level;Meal options for control of blood glucose level and chronic complications.    Physical activity and exercise  Role of exercise on diabetes management, blood pressure control and cardiac health.    Medications Taught/reviewed insulin injection, site rotation, insulin storage and needle disposal.;Reviewed patients medication for diabetes, action, purpose, timing of dose and side effects.    Monitoring Taught/discussed recording of test results and interpretation of SMBG.;Identified appropriate SMBG and/or A1C goals.;Daily foot exams;Yearly dilated eye exam    Acute complications Taught treatment of hypoglycemia - the 15 rule.    Psychosocial adjustment Worked with patient to identify barriers to care and solutions;Identified and addressed patients feelings and concerns about diabetes      Individualized Goals (developed by patient)   Nutrition General guidelines for healthy choices and portions discussed    Physical Activity Exercise 5-7 days per week;30 minutes per day    Medications take my medication as prescribed    Monitoring  test my blood glucose as discussed    Reducing Risk increase portions of healthy fats;treat hypoglycemia with 15 grams of carbs if blood  glucose less than 70mg /dL      Post-Education Assessment   Patient understands the diabetes disease and treatment process. Needs Review    Patient understands incorporating nutritional management into lifestyle. Needs Review    Patient undertands incorporating physical activity into lifestyle. Demonstrates understanding / competency    Patient understands using medications safely. Needs Review    Patient understands monitoring blood glucose, interpreting and using results Needs Review    Patient understands prevention, detection, and treatment of acute complications. Needs Review    Patient understands prevention, detection, and treatment of chronic complications. Needs Review    Patient understands how to develop strategies to address psychosocial issues. Needs Review    Patient understands how to develop strategies to promote health/change behavior. Needs Review  Outcomes   Expected Outcomes Demonstrated interest in learning. Expect positive outcomes    Future DMSE 4-6 wks    Program Status Not Completed             Individualized Plan for Diabetes Self-Management Training:   Learning Objective:  Patient will have a greater understanding of diabetes self-management. Patient education plan is to attend individual and/or group sessions per assessed needs and concerns.   Plan:   Patient Instructions  Continue to walk 30 minutes most days and consider walking 10 minutes after each meal to help lower your blood sugar.  Take your medicine as prescribed  Take Metformin with food (breakfast and dinner)  Take the insulin (Lantus) each morning and rotate where you give this  Drink beverages with no carbohydrates (no sugar) Eat plenty of vegetables Bake rather than fry Lean meat, remove the skin from the chicken. Fresh fruit or frozen berries    Expected Outcomes:  Demonstrated interest in learning. Expect positive outcomes  Education material provided: Meal plan card and  My Plate, hypoglycemia  If problems or questions, patient to contact team via:  Phone  Future DSME appointment: 4-6 wks

## 2021-05-05 NOTE — Patient Instructions (Addendum)
Continue to walk 30 minutes most days and consider walking 10 minutes after each meal to help lower your blood sugar.  Take your medicine as prescribed  Take Metformin with food (breakfast and dinner)  Take the insulin (Lantus) each morning and rotate where you give this  Drink beverages with no carbohydrates (no sugar) Eat plenty of vegetables Bake rather than fry Lean meat, remove the skin from the chicken. Fresh fruit or frozen berries

## 2021-05-12 ENCOUNTER — Ambulatory Visit: Payer: Medicare Other | Admitting: Nutrition

## 2021-05-18 ENCOUNTER — Telehealth: Payer: Self-pay | Admitting: Dietician

## 2021-05-18 NOTE — Telephone Encounter (Signed)
Returned patient call. Patient stated that his fasting glucose has usually been 80-90 and was 149 yesterday am.  101 at 2 pm yesterday.  He states that he is feeling well and no symptoms of hypoglycemia.   Medications include Lantus 22 units once daily, Metformin.    Discussed that his current glucose is normal.  Reviewed A1C as well.  Patient is to follow up during an appointment on 06/15/2021 and to call for any other questions.  Antonieta Iba, RD, LDN, CDCES

## 2021-06-15 ENCOUNTER — Encounter: Payer: Medicare Other | Attending: Internal Medicine | Admitting: Dietician

## 2021-06-15 DIAGNOSIS — E119 Type 2 diabetes mellitus without complications: Secondary | ICD-10-CM | POA: Insufficient documentation

## 2021-06-15 DIAGNOSIS — Z794 Long term (current) use of insulin: Secondary | ICD-10-CM | POA: Insufficient documentation

## 2021-06-15 DIAGNOSIS — Z7984 Long term (current) use of oral hypoglycemic drugs: Secondary | ICD-10-CM | POA: Insufficient documentation

## 2021-06-17 ENCOUNTER — Other Ambulatory Visit: Payer: Self-pay | Admitting: Student

## 2021-06-25 ENCOUNTER — Other Ambulatory Visit: Payer: Self-pay | Admitting: Student

## 2021-07-10 ENCOUNTER — Encounter: Payer: Medicare Other | Attending: Internal Medicine | Admitting: Dietician

## 2021-07-10 DIAGNOSIS — Z794 Long term (current) use of insulin: Secondary | ICD-10-CM | POA: Insufficient documentation

## 2021-07-10 DIAGNOSIS — Z7984 Long term (current) use of oral hypoglycemic drugs: Secondary | ICD-10-CM | POA: Insufficient documentation

## 2021-07-10 DIAGNOSIS — E119 Type 2 diabetes mellitus without complications: Secondary | ICD-10-CM | POA: Insufficient documentation

## 2021-07-24 ENCOUNTER — Other Ambulatory Visit: Payer: Self-pay | Admitting: Student

## 2021-07-24 DIAGNOSIS — Z955 Presence of coronary angioplasty implant and graft: Secondary | ICD-10-CM

## 2021-07-24 DIAGNOSIS — I252 Old myocardial infarction: Secondary | ICD-10-CM

## 2021-07-24 DIAGNOSIS — I2510 Atherosclerotic heart disease of native coronary artery without angina pectoris: Secondary | ICD-10-CM

## 2021-08-10 ENCOUNTER — Encounter: Payer: Self-pay | Admitting: Dietician

## 2021-08-10 ENCOUNTER — Encounter: Payer: Medicare Other | Attending: Internal Medicine | Admitting: Dietician

## 2021-08-10 DIAGNOSIS — E1169 Type 2 diabetes mellitus with other specified complication: Secondary | ICD-10-CM | POA: Insufficient documentation

## 2021-08-10 NOTE — Progress Notes (Signed)
?Diabetes Self-Management Education ? ?Visit Type: Follow-up ? ?Appt. Start Time: 1200 Appt. End Time: 1230 ? ?08/10/2021 ? ?[name], identified by name and date of birth, is a 66 y.o. male with a diagnosis of Diabetes:  .  ? ?ASSESSMENT ?Patient is here today with his wife.  He was last seen by myself on 05/05/2021. ? ?He states that his vision is much improved and he can now drive. ?He is taking 22 units of Lantus q am and Metformin. ?Blood Glucose Meter reviewed.  Average blood glucose 88 and has had 2 low blood glucose this week which he treated with juice. ?He would like to try a CGM. ?He has stopped drinking coffee and drinks Pepsi zero and other beverages without sugar. ?He is walking daily for 45 minutes. ? ?History includes Type 2 diabetes, HTN, HLD, CKD, CVA, MI, CAD, depression, recent GI bleed with surgery (ileocecectomy 04/19/2021. ?He has not smoked since being in the hospital.  Son and wife both smoke. ?A1C >15.5% 04/19/21 ?Medications:  metformin, Lantus 22 units q am ?  ?156 lbs 08/10/2021 ?169 lbs 04/04/2021 ?  ?Patient lives with his wife.  His son also lives with them. They share shopping and cooking.  He is retired and used to be a Administrator.  He states that he has a learning disability and has enquired about testing.  He states that he can read but cannot comprehend. ?[place] and has started walking. ? ?Weight 156 lb (70.8 kg). ?Body mass index is 25.18 kg/m?. ? ? Diabetes Self-Management Education - 08/10/21 1727   ? ?  ? Visit Information  ? Visit Type Follow-up   ?  ? Initial Visit  ? Are you taking your medications as prescribed? Yes   ?  ? Psychosocial Assessment  ? Patient Belief/Attitude about Diabetes Motivated to manage diabetes   ? Self-care barriers Low literacy   ? Self-management support Doctor's office;Family;CDE visits   ? Other persons present Patient;Spouse/SO   ? Special Needs None   ? Preferred Learning Style No preference indicated   ? Learning  Readiness Ready   ? How often do you need to have someone help you when you read instructions, pamphlets, or other written materials from your doctor or pharmacy? 5 - Always   ?  ? Pre-Education Assessment  ? Patient understands the diabetes disease and treatment process. Comprehends key points   ? Patient understands incorporating nutritional management into lifestyle. Comprehends key points   ? Patient undertands incorporating physical activity into lifestyle. Demonstrates understanding / competency   ? Patient understands using medications safely. Demonstrates understanding / competency   ? Patient understands monitoring blood glucose, interpreting and using results Comprehends key points   ? Patient understands prevention, detection, and treatment of acute complications. Needs Instruction   ? Patient understands prevention, detection, and treatment of chronic complications. Needs Instruction   ? Patient understands how to develop strategies to address psychosocial issues. Needs Instruction   ? Patient understands how to develop strategies to promote health/change behavior. Needs Instruction   ?  ? Complications  ? How often do you check your blood sugar? 1-2 times/day   ? Fasting Blood glucose range (mg/dL) 70-129   ? Postprandial Blood glucose range (mg/dL) 130-179   ? Number of hypoglycemic episodes per month 2   ? Can you tell when your blood sugar is low? Yes   ? What do you do if your blood sugar is low? drinks  juice   ? Number of hyperglycemic episodes ( >284m/dL): Occasional   ?  ? Dietary Intake  ? Breakfast egg and cheese biscuit   ? Lunch [employer] on [employer] fruit   ? Snack (afternoon) 1 piece of candy, NABS   ? Dinner baked fish or chicken, potatoes or mac and cheese, [name] beans   ? Snack (evening) none   ? Beverage(s) water, Pepsi Zero   ?  ? Activity / Exercise  ? Activity / Exercise Type Light (walking / raking leaves)   ? How many days per week do you exercise? 7   ? How many minutes per day  do you exercise? 45   ? Total minutes per week of exercise 315   ?  ? Patient Education  ? Previous Diabetes Education Yes (please comment)   04/2021  ? Healthy Eating Meal options for control of blood glucose level and chronic complications.   ? Being Active Role of exercise on diabetes management, blood pressure control and cardiac health.   ? Medications Reviewed patients medication for diabetes, action, purpose, timing of dose and side effects.   ? Monitoring Taught/evaluated SMBG meter.;Identified appropriate SMBG and/or A1C goals.   ? Acute complications Taught prevention, symptoms, and  treatment of hypoglycemia - the 15 rule.   ? Chronic complications Relationship between chronic complications and blood glucose control   ? Diabetes Stress and Support Worked with patient to identify barriers to care and solutions   ? Lifestyle and Health Coping Review risk of smoking and offered smoking cessation   ?  ? Individualized Goals (developed by patient)  ? Nutrition General guidelines for healthy choices and portions discussed   ? Physical Activity Exercise 5-7 days per week;45 minutes per day   ? Medications take my medication as prescribed   ? Monitoring  Test my blood glucose as discussed   ? Problem Solving Eating Pattern   ? Reducing Risk examine blood glucose patterns;do foot checks daily;treat hypoglycemia with 15 grams of carbs if blood glucose less than 766mdL   ? Health Coping Discuss barriers to diabetes care with support person/system (comment specifics as needed)   ?  ? Patient Self-Evaluation of Goals - Patient rates self as meeting previously set goals (% of time)  ? Nutrition >75% (most of the time)   ? Physical Activity >75% (most of the time)   ? Medications >75% (most of the time)   ? Monitoring >75% (most of the time)   ? Problem Solving and behavior change strategies  >75% (most of the time)   ? Reducing Risk (treating acute and chronic complications) >7>89%most of the time)   ? Health Coping  >75% (most of the time)   ?  ? Post-Education Assessment  ? Patient understands the diabetes disease and treatment process. Demonstrates understanding / competency   ? Patient understands incorporating nutritional management into lifestyle. Comprehends key points   ? Patient undertands incorporating physical activity into lifestyle. Comprehends key points   ? Patient understands using medications safely. Comphrehends key points   ? Patient understands monitoring blood glucose, interpreting and using results Comprehends key points   ? Patient understands prevention, detection, and treatment of acute complications. Comprehends key points   ? Patient understands prevention, detection, and treatment of chronic complications. Comprehends key points   ? Patient understands how to develop strategies to address psychosocial issues. Comprehends key points   ? Patient understands how to develop strategies to promote health/change behavior. Comprehends  key points   ?  ? Outcomes  ? Expected Outcomes Demonstrated interest in learning. Expect positive outcomes   ? Future DMSE 3-4 months   ? Program Status Not Completed   ?  ? Subsequent Visit  ? Since your last visit have you continued or begun to take your medications as prescribed? Yes   ? Since your last visit have you experienced any weight changes? Loss   ? Weight Loss (lbs) 13   ? Since your last visit, are you checking your blood glucose at least once a day? Yes   ? ?  ?  ? ?  ? ? ?Individualized Plan for Diabetes Self-Management Training:  ? ?Learning Objective:  Patient will have a greater understanding of diabetes self-management. ?Patient education plan is to attend individual and/or group sessions per assessed needs and concerns. ?  ?Plan:  ? ?Patient Instructions  ?Great job on the changes that you have made! ?Continue to be active and walk daily. ?Continue to take your medication as prescribed. ?Treat a blood sugar less than 70 with 1/2 cup juice ?Speak to your  doctor if you are having more than 2-3 lows per week. ?Continue to bake rather than fry most often. ? ?Consider quitting smoking. ? ?Expected Outcomes:  Demonstrated interest in learning. Expect positive outcomes

## 2021-08-10 NOTE — Patient Instructions (Signed)
Great job on the changes that you have made! Continue to be active and walk daily. Continue to take your medication as prescribed. Treat a blood sugar less than 70 with 1/2 cup juice Speak to your doctor if you are having more than 2-3 lows per week. Continue to bake rather than fry most often.   Consider quitting smoking. 

## 2021-09-10 ENCOUNTER — Ambulatory Visit: Payer: 59 | Admitting: Student

## 2021-09-10 NOTE — Progress Notes (Deleted)
Primary Physician/Referring:  [name], MD  Patient ID: [name], male    DOB: [birth year], 66 y.o.   MRN: [id number]  No chief complaint on file.  HPI:    [name]  is a 66 y.o. Caucasian male with history of non-STEMI and angioplasty and stent, peripheral artery disease with interventions to his lower extremity in the remote past in [place], primary hypertension, mixed hyperlipidemia, recently quit smoking (1 week ago), active marijuana use, history of substance abuse remotely, hypertension, history of TIA and excessive alcohol use quit in July 2021, family history of premature coronary artery disease in which his father died at the age of 69 and his mother at age 59 from MI with CABG in her 32s.    Underwent left carotid endarterectomy on 03/04/2020 and was found to have postop atrial fibrillation with RVR.  Underwent successful right carotid endarterectomy 04/08/2020.   Patient was last seen in the office 03/12/2021 at which time resumed metoprolol succinate 25 mg daily given uncontrolled hypertension, unfortunately repeat lipid profile testing has not been done as ordered.  Since last office visit patient was admitted 04/17/2021 - 05/03/2021 with diabetic ketoacidosis, embolic mesenteric ischemia, and acute renal failure. He now presents for 6 month follow up. ***  ***  Patient presents for 67-monthfollow-up.  At last office visit ordered ABI given reduced pedal pulses, which was stable with moderately reduced perfusion bilaterally compared to 2017.  During last office visit noted new systolic murmur at the right upper sternal border, therefore obtain echocardiogram.  Echocardiogram unchanged compared to 06/2020 with LVEF 55%, moderate LVH with grade 1 diastolic dysfunction, and moderate MR and mild to moderate tricuspid regurgitation.Since last office visit Lopressor has been discontinued because patient ran out. Notably previously ordered lipid panel has not been done.  Office visit  patient has unfortunately resumed smoking approximately a pack per day and using marijuana regularly.  He continues to refrain from alcohol use.  He is now seeing a psychiatrist.  Patient denies chest pain, palpitations, syncope, near syncope, symptoms suggestive of TIA/CVA.  He denies symptoms of claudication.  He also is tolerating anticoagulation without bleeding diathesis.  Past Medical History:  Diagnosis Date   AKI (acute kidney injury) ([facility] 12/2015   Anxiety    Arthritis    Carotid artery stenosis    Coronary artery disease    Dementia ([facility]    problems with short term memory   Depression    D[place] (diabetic [place]acidosis) [place] 04/16/2021   Dysrhythmia    episode of afib 12/2005 in setting of cocaine use   Gout    Hyperlipidemia    Hypertension    Myocardial infarction ([facility]    2017   PAD (peripheral artery disease) (HCC)    Stroke (HCC)    TIA   TIA (transient ischemic attack)    over 20 years ago   Past Surgical History:  Procedure Laterality Date   CARDIAC CATHETERIZATION N/A 12/18/2015   Procedure: Left Heart Cath and Coronary Angiography;  Surgeon: JAdrian Prows MD;  [facility];  Service: Cardiovascular;  Laterality: N/A;   CARDIAC CATHETERIZATION N/A 12/18/2015   Procedure: Coronary Stent Intervention;  Surgeon: JAdrian Prows MD;  [facility];  Service: Cardiovascular;  Laterality: N/A;   CARDIAC CATHETERIZATION N/A 12/18/2015   Procedure: Coronary Balloon Angioplasty;  Surgeon: JAdrian Prows MD;  [facility];  Service: Cardiovascular;  Laterality: N/A;   COLONOSCOPY N/A 01/12/2016  Procedure: COLONOSCOPY;  Surgeon: Manus Gunning, MD;  Location: [facility]COPY;  Service: Gastroenterology;  Laterality: N/A;   ENDARTERECTOMY Left 03/04/2020   Procedure: LEFT ENDARTERECTOMY CAROTID;  Surgeon: Rosetta Posner, MD;  Location: [facility];  Service: Vascular;  Laterality: Left;   ENDARTERECTOMY Right 04/22/2020   Procedure: RIGHT  CAROTID ENDARTERECTOMY;  Surgeon: Rosetta Posner, MD;  Location: [place];  Service: Vascular;  Laterality: Right;   HERNIA REPAIR     LAPAROSCOPY N/A 04/19/2021   Procedure: LAPAROSCOPY DIAGNOSTIC;  Surgeon: Felicie Morn, MD;  Location: [facility];  Service: General;  Laterality: N/A;   LAPAROTOMY N/A 04/19/2021   Procedure: EXPLORATORY LAPAROTOMY, ILEOCECECTOMY;  Surgeon: Felicie Morn, MD;  Location: [facility];  Service: General;  Laterality: N/A;   PATCH ANGIOPLASTY Left 03/04/2020   Procedure: PATCH ANGIOPLASTY;  Surgeon: Rosetta Posner, MD;  Location: [facility];  Service: Vascular;  Laterality: Left;   PERIPHERAL VASCULAR CATHETERIZATION N/A 12/18/2015   Procedure: Abdominal Aortogram;  Surgeon: Adrian Prows, MD;  Location: [facility];  Service: Cardiovascular;  Laterality: N/A;   Family History  Problem Relation Age of Onset   Heart attack Mother    Heart attack Father    IgA nephropathy Son     Social History   Tobacco Use   Smoking status: Every Day    Packs/day: 1.00    Years: 21.00    Total pack years: 21.00    Types: Cigarettes   Smokeless tobacco: Never  Substance Use Topics   Alcohol use: Not Currently    Comment: quit drinking 9 months ago   Marital Status: Legally Separated  ROS  Review of Systems  Constitutional: Negative for malaise/fatigue and weight gain.  Cardiovascular:  Positive for dyspnea on exertion (chornic, stable). Negative for chest pain, claudication, leg swelling, near-syncope, orthopnea, palpitations, paroxysmal nocturnal dyspnea and syncope.  Hematologic/Lymphatic: Does not bruise/bleed easily.  Gastrointestinal:  Negative for melena.  Neurological:  Negative for dizziness and weakness.   Objective  There were no vitals taken for this visit.     08/10/2021   12:06 PM 05/05/2021    8:35 AM 05/03/2021   11:39 AM  Vitals with BMI  Height  '5\' 6"'    Weight 156 lbs 169 lbs   BMI  27.51   Systolic   700  Diastolic   96  Pulse   55     Physical  Exam Vitals reviewed.  Cardiovascular:     Rate and Rhythm: Normal rate and regular rhythm.     Pulses: Intact distal pulses.          Carotid pulses are  on the right side with bruit and  on the left side with bruit.      Popliteal pulses are 1+ on the right side and 1+ on the left side.       Dorsalis pedis pulses are 1+ on the right side and 1+ on the left side.       Posterior tibial pulses are 0 on the right side and 0 on the left side.     Heart sounds: S1 normal and S2 normal. Murmur heard.     Systolic murmur is present with a grade of 2/6 at the upper right sternal border.     No gallop.     Comments: No leg edema, no JVD. Left carotid endarterectomy scar has healed well. Right carotid endarterectomy incision healed well.  Pulmonary:     Effort: Pulmonary effort  is normal.     Breath sounds: Rhonchi (bilaterally throughout) present.    Laboratory examination:   Recent Labs    04/28/21 0354 04/29/21 0500 04/30/21 0415  NA 139 137 138  K 4.6 4.5 4.3  CL 110 107 107  CO2 '23 23 24  ' GLUCOSE 128* 160* 132*  BUN '12 16 9  ' CREATININE 0.95 1.18 1.01  CALCIUM 8.0* 8.2* 7.9*  GFRNONAA >60 >60 >60    CrCl cannot be calculated (Patient's most recent lab result is older than the maximum 21 days allowed.).     Latest Ref Rng & Units 04/30/2021    4:15 AM 04/29/2021    5:00 AM 04/28/2021    3:54 AM  CMP  Glucose 70 - 99 mg/dL 132  160  128   BUN 8 - 23 mg/dL '9  16  12   ' Creatinine 0.61 - 1.24 mg/dL 1.01  1.18  0.95   Sodium 135 - 145 mmol/L 138  137  139   Potassium 3.5 - 5.1 mmol/L 4.3  4.5  4.6   Chloride 98 - 111 mmol/L 107  107  110   CO2 22 - 32 mmol/L '24  23  23   ' Calcium 8.9 - 10.3 mg/dL 7.9  8.2  8.0   Total Protein 6.5 - 8.1 g/dL 4.4     Total Bilirubin 0.3 - 1.2 mg/dL 0.4     Alkaline Phos 38 - 126 U/L 72     AST 15 - 41 U/L 25     ALT 0 - 44 U/L 23         Latest Ref Rng & Units 05/03/2021    3:42 AM 05/02/2021    3:55 AM 05/01/2021    4:51 AM  CBC  WBC 4.0 -  10.5 K/uL 7.6  7.7  7.7   Hemoglobin 13.0 - 17.0 g/dL 9.3  9.1  9.4   Hematocrit 39.0 - 52.0 % 28.8  28.0  28.1   Platelets 150 - 400 K/uL 158  151  146    Lipid Panel Recent Labs    03/24/21 1013 04/25/21 0106 04/27/21 0238  CHOL 79*  --   --   TRIG 220* 67 73  LDLCALC 17  --   --   HDL 28*  --   --      HEMOGLOBIN A1C Lab Results  Component Value Date   HGBA1C >15.5 (H) 04/19/2021   MPG >398 04/19/2021   TSH Recent Labs    10/09/20 1556 04/17/21 0211  TSH 1.59 0.636     Lipoprotein (a) <75.0 nmol/L 15.1, apolipoprotein B normal at 44.   Date 07/11/2019 Allergies   Allergies  Allergen Reactions   Vicodin [Hydrocodone-Acetaminophen] Nausea And Vomiting    Medications Prior to Visit:   Outpatient Medications Prior to Visit  Medication Sig Dispense Refill   allopurinol (ZYLOPRIM) 100 MG tablet Take 100 mg by mouth daily.     amLODipine (NORVASC) 5 MG tablet Take 1 tablet (5 mg total) by mouth daily. 30 tablet 3   atorvastatin (LIPITOR) 80 MG tablet TAKE 1 TABLET BY MOUTH DAILY (Patient taking differently: Take 80 mg by mouth at bedtime.) 90 tablet 3   blood glucose meter kit and supplies Dispense based on patient and insurance preference. Use up to four times daily as directed. (FOR ICD-10 E10.9, E11.9). 1 each 0   busPIRone (BUSPAR) 5 MG tablet Take 5 mg by mouth 2 (two) times daily as needed (anxiety).  colchicine 0.6 MG tablet Take 0.6 mg by mouth daily as needed (gout flare). (Patient not taking: Reported on 05/05/2021)     ELIQUIS 5 MG TABS tablet TAKE 1 TABLET(5 MG) BY MOUTH TWICE DAILY 60 tablet 11   escitalopram (LEXAPRO) 20 MG tablet Take 20 mg by mouth daily.     hydrOXYzine (ATARAX/VISTARIL) 25 MG tablet Take 25 mg by mouth at bedtime as needed (sleep).     insulin glargine (LANTUS) 100 UNIT/ML Solostar Pen Inject 22 Units into the skin daily. 15 mL 11   Insulin Pen Needle 32G X 4 MM MISC 1 each by Does not apply route as needed. 200 each 0   metFORMIN  (GLUCOPHAGE) 500 MG tablet Take 1 tablet (500 mg total) by mouth 2 (two) times daily with a meal. 60 tablet 11   metoprolol succinate (TOPROL-XL) 50 MG 24 hr tablet Take 1 tablet (50 mg total) by mouth daily. Take with or immediately following a meal. 30 tablet 11   nitroGLYCERIN (NITROSTAT) 0.4 MG SL tablet Place 1 tablet (0.4 mg total) under the tongue every 5 (five) minutes as needed. For chest pain (Patient not taking: Reported on 05/05/2021) 30 tablet 0   traZODone (DESYREL) 100 MG tablet Take 100 mg by mouth at bedtime.     VASCEPA 1 g capsule Take 2 capsules (2 g total) by mouth 2 (two) times daily. 120 capsule 1   No facility-administered medications prior to visit.   Final Medications at End of Visit    No outpatient medications have been marked as taking for the 09/10/21 encounter (Appointment) with Rayetta Pigg, Deontaye Civello C, PA-C.   Radiology:   CTA head and neck 02/11/2020: 1. Atherosclerotic changes at the bilateral carotid bifurcations. Focal area of bandlike stenosis in the right carotid bifurcation resulting in approximately 90% stenosis. 2. Atherosclerotic changes of the left carotid bifurcation resulting in approximately 70% stenosis. 3. No acute intracranial abnormality. Remote infarcts within the bilateral basal ganglia. Chronic microangiopathy. 4. Emphysema and aortic atherosclerosis.  Cardiac Studies:   Echocardiogram 07/18/2019: Normal LV systolic function with visual EF 50-55%. Left ventricle cavity is normal in size. Moderate left ventricular hypertrophy. Normal global wall motion. Doppler evidence of grade II  diastolic dysfunction, elevated LAP.  Left atrial cavity is mildly dilated. Mild (Grade I) mitral regurgitation. Mild tricuspid regurgitation. Mild pulmonic regurgitation. Prior study dated 02/25/2016 noted LVEF 65%, mild to moderate MR, mild TR.   Carotid artery duplex  12/28/2019: Stenosis in the right internal carotid artery (80-99%). Stenosis in the right  external carotid artery (>50%). Stenosis in the left internal carotid artery (80-99%). Stenosis in the left external carotid artery (<50%). The PSV internal/common carotid artery ratio is consistent with a stenosis of >70% bilaterally, right  7.89 and left 8.67.  Antegrade right vertebral artery flow. Antegrade left vertebral artery flow. Follow up in four months is appropriate if clinically indicated. Peak velocity on the right was 349/117 cm/s and 334/106 cm/s on the left. Compared to the study done on 07/18/2019, there is progression of disease in bilateral internal carotid artery.  Previously right ICA stenosis was 50 to 69% and left was >70%. See attached images.   Echocardiogram 04/29/2021: 1. Left ventricular ejection fraction, by estimation, is 55 to 60%. The  left ventricle has normal function. The left ventricle has no regional  wall motion abnormalities. Left ventricular diastolic parameters are  consistent with Grade I diastolic  dysfunction (impaired relaxation).   2. Right ventricular systolic function is normal. The  right ventricular  size is normal.   3. The mitral valve is normal in structure. Trivial mitral valve  regurgitation. No evidence of mitral stenosis.   4. The aortic valve is normal in structure. Aortic valve regurgitation is  not visualized. Aortic valve sclerosis is present, with no evidence of  aortic valve stenosis.   5. The inferior vena cava is normal in size with greater than 50%  respiratory variability, suggesting right atrial pressure of 3 mmHg.   EKG:  ***  03/12/2021: Sinus rhythm at a rate of 65 bpm.  Normal axis.  Left atrial enlargement.  No evidence of ischemia or underlying injury pattern.  Compared to 09/09/2020, no significant change.  EKG 04/04/2020: Atrial fibrillation with controlled ventricular response at the rate of 67 bpm, normal axis, poor R wave progression, cannot exclude anteroseptal infarct old.  No evidence of ischemia.  No significant  change from EKG 03/05/2020.  EKG 01/09/2020: Marked sinus bradycardia at rate of 46 bpm, m normal axis, poor R wave progression, probably normal variant.  No evidence of ischemia.    Assessment   No diagnosis found.  {CHA2DS2-VASc Score is 5.  Yearly risk of stroke: 7.2% (A, HTN, Vasc Dz, CVA).  Score of 1=0.6; 2=2.2; 3=3.2; 4=4.8; 5=7.2; 6=9.8; 7=>9.8) -(CHF; HTN; vasc disease DM,  Male = 1; Age <65 =0; 65-74 = 1,  >75 =2; stroke/embolism= 2).     There are no discontinued medications.   No orders of the defined types were placed in this encounter.   Recommendations:   [name] is a 66 y.o.  Caucasian male with history of non-STEMI and angioplasty and stent, peripheral artery disease with interventions to his lower extremity in the remote past in [place], primary hypertension, mixed hyperlipidemia, active smoking, active marijuana use, history of substance abuse remotely, hypertension, history of TIA and excessive alcohol use quit in July 2021, family history of premature coronary artery disease in which his father died at the age of 48 and his mother at age 51 from MI with CABG in her 60s.    Underwent left carotid endarterectomy on 03/04/2020 and was found to have postop atrial fibrillation with RVR.  Underwent successful right carotid endarterectomy 04/08/2020.   Patient was last seen in the office 03/12/2021 at which time resumed metoprolol succinate 25 mg daily given uncontrolled hypertension, unfortunately repeat lipid profile testing has not been done as ordered.  Since last office visit patient was admitted 04/17/2021 - 05/03/2021 with diabetic ketoacidosis, embolic mesenteric ischemia, and acute renal failure. He now presents for 6 month follow up. ***  ***  Patient presents for 62-monthfollow-up.  At last office visit ordered ABI given reduced pedal pulses, which was stable with moderately reduced perfusion bilaterally compared to 2017.  During last office visit noted new systolic  murmur at the right upper sternal border, therefore obtain echocardiogram.  Echocardiogram unchanged compared to 06/2020 with LVEF 55%, moderate LVH with grade 1 diastolic dysfunction, and moderate MR and mild to moderate tricuspid regurgitation.Since last office visit Lopressor has been discontinued because he ran out.  Notably previously ordered lipid panel has not been done.   Patient agrees to obtain lipid profile testing in the next 1 week. Given mild elevation of blood pressure and history of CAD we will resume metoprolol succinate 25 mg once daily.  Patient remains in asymptomatic, however he is unfortunately resumed smoking.  Spent approximately 5 minutes of today's office visit counseling him regarding tobacco cessation.  Patient's physical  exam and EKG remained stable.  Follow-up in 6 months, sooner if needed, for CAD, hypertension, hyperlipidemia, PAD.   Alethia Berthold, PA-C 09/10/2021, 8:43 AM Office: [phone]

## 2021-09-16 ENCOUNTER — Other Ambulatory Visit: Payer: Self-pay | Admitting: Internal Medicine

## 2021-09-17 LAB — COMPLETE METABOLIC PANEL WITH GFR
AG Ratio: 1.6 (calc) (ref 1.0–2.5)
ALT: 14 U/L (ref 9–46)
AST: 18 U/L (ref 10–35)
Albumin: 4.2 g/dL (ref 3.6–5.1)
Alkaline phosphatase (APISO): 103 U/L (ref 35–144)
BUN: 11 mg/dL (ref 7–25)
CO2: 21 mmol/L (ref 20–32)
Calcium: 8.9 mg/dL (ref 8.6–10.3)
Chloride: 108 mmol/L (ref 98–110)
Creat: 0.96 mg/dL (ref 0.70–1.35)
Globulin: 2.6 g/dL (calc) (ref 1.9–3.7)
Glucose, Bld: 88 mg/dL (ref 65–99)
Potassium: 3.8 mmol/L (ref 3.5–5.3)
Sodium: 139 mmol/L (ref 135–146)
Total Bilirubin: 0.4 mg/dL (ref 0.2–1.2)
Total Protein: 6.8 g/dL (ref 6.1–8.1)
eGFR: 87 mL/min/{1.73_m2} (ref >=60)

## 2021-09-17 LAB — LIPID PANEL
Cholesterol: 73 mg/dL (ref <200)
HDL: 41 mg/dL (ref >=40)
LDL Cholesterol (Calc): 18 mg/dL (calc)
Non-HDL Cholesterol (Calc): 32 mg/dL (calc) (ref <130)
Total CHOL/HDL Ratio: 1.8 (calc) (ref <5.0)
Triglycerides: 56 mg/dL (ref <150)

## 2021-09-17 LAB — CBC
HCT: 38.6 % (ref 38.5–50.0)
Hemoglobin: 12.8 g/dL — ABNORMAL LOW (ref 13.2–17.1)
MCH: 31.2 pg (ref 27.0–33.0)
MCHC: 33.2 g/dL (ref 32.0–36.0)
MCV: 94.1 fL (ref 80.0–100.0)
MPV: 12.1 fL (ref 7.5–12.5)
Platelets: 197 10*3/uL (ref 140–400)
RBC: 4.1 10*6/uL — ABNORMAL LOW (ref 4.20–5.80)
RDW: 15 % (ref 11.0–15.0)
WBC: 8.6 10*3/uL (ref 3.8–10.8)

## 2021-09-17 LAB — URIC ACID: Uric Acid, Serum: 5.6 mg/dL (ref 4.0–8.0)

## 2021-09-17 LAB — VITAMIN D 25 HYDROXY (VIT D DEFICIENCY, FRACTURES): Vit D, 25-Hydroxy: 73 ng/mL (ref 30–100)

## 2021-09-17 LAB — PSA: PSA: 0.96 ng/mL (ref <=4.00)

## 2021-09-17 LAB — TSH: TSH: 1.72 mIU/L (ref 0.40–4.50)

## 2021-09-25 ENCOUNTER — Other Ambulatory Visit: Payer: Self-pay | Admitting: Student

## 2021-09-25 DIAGNOSIS — I252 Old myocardial infarction: Secondary | ICD-10-CM

## 2021-09-25 DIAGNOSIS — I2510 Atherosclerotic heart disease of native coronary artery without angina pectoris: Secondary | ICD-10-CM

## 2021-09-25 DIAGNOSIS — Z955 Presence of coronary angioplasty implant and graft: Secondary | ICD-10-CM

## 2021-10-13 ENCOUNTER — Other Ambulatory Visit: Payer: Self-pay | Admitting: Student

## 2021-10-16 ENCOUNTER — Encounter: Payer: Self-pay | Admitting: Student

## 2021-10-16 ENCOUNTER — Ambulatory Visit: Payer: 59 | Admitting: Student

## 2021-10-16 VITALS — BP 124/65 | HR 55 | Temp 98.0°F | Resp 16 | Ht 66.0 in | Wt 152.0 lb

## 2021-10-16 DIAGNOSIS — I739 Peripheral vascular disease, unspecified: Secondary | ICD-10-CM

## 2021-10-16 DIAGNOSIS — I480 Paroxysmal atrial fibrillation: Secondary | ICD-10-CM

## 2021-10-16 DIAGNOSIS — I10 Essential (primary) hypertension: Secondary | ICD-10-CM

## 2021-10-16 DIAGNOSIS — I6523 Occlusion and stenosis of bilateral carotid arteries: Secondary | ICD-10-CM

## 2021-10-16 NOTE — Progress Notes (Signed)
Primary Physician/Referring:  [name], MD  Patient ID: [name], male    DOB: Feb 22, 1956, 66 y.o.   MRN: [id number]  Chief Complaint  Patient presents with   Atrial Fibrillation   Follow-up    6 month   HPI:    [name]  is a 66 y.o. 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    Underwent left carotid endarterectomy on 03/04/2020 and was found to have postop atrial fibrillation with RVR.  Underwent successful right carotid endarterectomy 04/08/2020.   Since last office visit patient was admitted 04/17/2021 - 05/03/2021 for DKA, subsequently developed abdominal pain found to have ischemic bowel and underwent laparotomy.  Patient developed postsurgical ileus as well as lower GI bleed and AKI.  Eliquis was held for 2 days during admission, however he was discharged on anticoagulation.  Patient was diagnosed with new onset type 2 diabetes during this admission.  Patient was last seen in the office 03/12/2021 at which time ordered repeat lipid profile testing and resumed Toprol-XL 25 mg p.o. daily.  Lipids are now under excellent control.  He presents for 41-month follow-up.  Patient has recovered well since discharge and is feeling well overall.  He has returned to baseline activity.  He unfortunately continues to smoke approximately a pack per week and uses marijuana regularly.  He is already on anticoagulation without bleeding diathesis. Patient denies chest pain, palpitations, syncope,  near syncope, symptoms suggestive of TIA/CVA.  He denies symptoms of claudication.  Patient has upcoming appointment including lab work with PCP in 2 weeks.  He also recognizes need to schedule follow-up appointment with vascular surgery for management of carotid artery stenosis.   Personally reviewed recent lipid profile testing, triglycerides are uncontrolled, LDL under excellent control.  Suspect elevated triglycerides are related to underlying uncontrolled diabetes.  Patient is not on diabetic medications and will be following with PCP for further management and reevaluation.  Although patient's heart rate is low today he denies dizziness, lightheadedness.  Past Medical History:  Diagnosis Date   AKI (acute kidney injury) ([place]) 12/2015   Anxiety    Arthritis    Carotid artery stenosis    Coronary artery disease    Dementia ([place])    problems with short term memory   Depression    DKA (diabetic ketoacidosis) ([place]) 04/16/2021   Dysrhythmia    episode of afib 12/2005 in setting of cocaine use   Gout    Hyperlipidemia    Hypertension    Myocardial infarction [facility]    2017   PAD (peripheral artery disease) (HCC)    Stroke (HCC)    TIA   TIA (transient ischemic attack)    over 20 years ago   Past Surgical History:  Procedure Laterality Date   CARDIAC CATHETERIZATION N/A 12/18/2015   Procedure: Left Heart Cath and Coronary Angiography;  Surgeon: Adrian Prows, MD;  Location: [place] [facility];  Service: Cardiovascular;  Laterality: N/A;   CARDIAC CATHETERIZATION N/A 12/18/2015   Procedure: Coronary Stent  Intervention;  Surgeon: Adrian Prows, MD;  Location: [place] [facility];  Service: Cardiovascular;  Laterality: N/A;   CARDIAC CATHETERIZATION N/A 12/18/2015   Procedure: Coronary Balloon Angioplasty;  Surgeon: Adrian Prows, MD;  Location: [place] [facility];  Service: Cardiovascular;  Laterality: N/A;   COLONOSCOPY N/A 01/12/2016   Procedure: COLONOSCOPY;  Surgeon: Manus Gunning, MD;   Location: [facility] ENDOSCOPY;  Service: Gastroenterology;  Laterality: N/A;   ENDARTERECTOMY Left 03/04/2020   Procedure: LEFT ENDARTERECTOMY CAROTID;  Surgeon: Rosetta Posner, MD;  Location: [facility] OR;  Service: Vascular;  Laterality: Left;   ENDARTERECTOMY Right 04/22/2020   Procedure: RIGHT CAROTID ENDARTERECTOMY;  Surgeon: Rosetta Posner, MD;  Location: [place];  Service: Vascular;  Laterality: Right;   HERNIA REPAIR     LAPAROSCOPY N/A 04/19/2021   Procedure: LAPAROSCOPY DIAGNOSTIC;  Surgeon: Felicie Morn, MD;  Location: [place];  Service: General;  Laterality: N/A;   LAPAROTOMY N/A 04/19/2021   Procedure: EXPLORATORY LAPAROTOMY, ILEOCECECTOMY;  Surgeon: Felicie Morn, MD;  Location: [place];  Service: General;  Laterality: N/A;   PATCH ANGIOPLASTY Left 03/04/2020   Procedure: PATCH ANGIOPLASTY;  Surgeon: Rosetta Posner, MD;  Location: [place];  Service: Vascular;  Laterality: Left;   PERIPHERAL VASCULAR CATHETERIZATION N/A 12/18/2015   Procedure: Abdominal Aortogram;  Surgeon: Adrian Prows, MD;  Location: [place] [facility];  Service: Cardiovascular;  Laterality: N/A;   Family History  Problem Relation Age of Onset   Heart attack Mother    Heart attack Father    IgA nephropathy Son     Social History   Tobacco Use   Smoking status: Every Day    Packs/day: 1.00    Years: 21.00    Total pack years: 21.00    Types: Cigarettes   Smokeless tobacco: Never  Substance Use Topics   Alcohol use: Not Currently    Comment: quit drinking 9 months ago   Marital Status: Legally Separated  ROS  Review of Systems  Constitutional: Negative for malaise/fatigue and weight gain.  Cardiovascular:  Positive for dyspnea on exertion (chornic, stable). Negative for chest pain, claudication, leg swelling, near-syncope, orthopnea, palpitations, paroxysmal nocturnal dyspnea and syncope.  Gastrointestinal:  Negative for melena.  Neurological:  Negative for dizziness.   Objective  Blood pressure 124/65, pulse (!)  55, temperature 98 F (36.7 C), resp. rate 16, height $RemoveBe'5\' 6"'zaVtURJah$  (1.676 m), weight 152 lb (68.9 kg), SpO2 98 %.     10/16/2021   11:43 AM 08/10/2021   12:06 PM 05/05/2021    8:35 AM  Vitals with BMI  Height $Remov'5\' 6"'QAItcq$   '5\' 6"'$   Weight 152 lbs 156 lbs 169 lbs  BMI 03.21  22.48  Systolic 250    Diastolic 65    Pulse 55       Physical Exam Vitals reviewed.  Cardiovascular:     Rate and Rhythm: Normal rate and regular rhythm.     Pulses: Intact distal pulses.          Carotid pulses are  on the right side with bruit and  on the left side with bruit.      Popliteal pulses are 1+ on the right side and 1+ on the left side.       Dorsalis pedis pulses are 1+ on the right side and 1+ on the left side.       Posterior tibial pulses are 0 on the right side and 0 on the left side.  Heart sounds: S1 normal and S2 normal. Murmur heard.     Systolic murmur is present with a grade of 2/6 at the upper right sternal border.     No gallop.     Comments: No leg edema, no JVD. Left carotid endarterectomy scar has healed well. Right carotid endarterectomy incision healed well.  Pulmonary:     Effort: Pulmonary effort is normal.     Breath sounds: Rhonchi (bilaterally throughout) present.    Laboratory examination:   Recent Labs    04/28/21 0354 04/29/21 0500 04/30/21 0415 09/16/21 1446  NA 139 137 138 139  K 4.6 4.5 4.3 3.8  CL 110 107 107 108  CO2 $Re'23 23 24 21  'tjw$ GLUCOSE 128* 160* 132* 88  BUN $Re'12 16 9 11  'oqc$ CREATININE 0.95 1.18 1.01 0.96  CALCIUM 8.0* 8.2* 7.9* 8.9  GFRNONAA >60 >60 >60  --    CrCl cannot be calculated (Patient's most recent lab result is older than the maximum 21 days allowed.).     Latest Ref Rng & Units 09/16/2021    2:46 PM 04/30/2021    4:15 AM 04/29/2021    5:00 AM  CMP  Glucose 65 - 99 mg/dL 88  132  160   BUN 7 - 25 mg/dL $Remove'11  9  16   'dBMBFIe$ Creatinine 0.70 - 1.35 mg/dL 0.96  1.01  1.18   Sodium 135 - 146 mmol/L 139  138  137   Potassium 3.5 - 5.3 mmol/L 3.8  4.3  4.5   Chloride  98 - 110 mmol/L 108  107  107   CO2 20 - 32 mmol/L $RemoveB'21  24  23   'dvmIIUrr$ Calcium 8.6 - 10.3 mg/dL 8.9  7.9  8.2   Total Protein 6.1 - 8.1 g/dL 6.8  4.4    Total Bilirubin 0.2 - 1.2 mg/dL 0.4  0.4    Alkaline Phos 38 - 126 U/L  72    AST 10 - 35 U/L 18  25    ALT 9 - 46 U/L 14  23        Latest Ref Rng & Units 09/16/2021    2:46 PM 05/03/2021    3:42 AM 05/02/2021    3:55 AM  CBC  WBC 3.8 - 10.8 Thousand/uL 8.6  7.6  7.7   Hemoglobin 13.2 - 17.1 g/dL 12.8  9.3  9.1   Hematocrit 38.5 - 50.0 % 38.6  28.8  28.0   Platelets 140 - 400 Thousand/uL 197  158  151    Lipid Panel Recent Labs    03/24/21 1013 04/25/21 0106 04/27/21 0238 09/16/21 1446  CHOL 79*  --   --  73  TRIG 220* 67 73 56  LDLCALC 17  --   --  18  HDL 28*  --   --  41  CHOLHDL  --   --   --  1.8     HEMOGLOBIN A1C Lab Results  Component Value Date   HGBA1C >15.5 (H) 04/19/2021   MPG >398 04/19/2021   TSH Recent Labs    04/17/21 0211 09/16/21 1446  TSH 0.636 1.72    Lipoprotein (a) <75.0 nmol/L 15.1, apolipoprotein B normal at 44.   Date 07/11/2019 Allergies   Allergies  Allergen Reactions   Vicodin [Hydrocodone-Acetaminophen] Nausea And Vomiting    Medications Prior to Visit:   Outpatient Medications Prior to Visit  Medication Sig Dispense Refill   allopurinol (ZYLOPRIM) 100 MG tablet Take 100 mg by mouth  daily.     amLODipine (NORVASC) 5 MG tablet Take 1 tablet (5 mg total) by mouth daily. 30 tablet 3   atorvastatin (LIPITOR) 80 MG tablet TAKE 1 TABLET BY MOUTH DAILY 90 tablet 3   blood glucose meter kit and supplies Dispense based on patient and insurance preference. Use up to four times daily as directed. (FOR ICD-10 E10.9, E11.9). 1 each 0   busPIRone (BUSPAR) 10 MG tablet Take 10 mg by mouth 2 (two) times daily as needed.     colchicine 0.6 MG tablet Take 0.6 mg by mouth daily as needed (gout flare).     ELIQUIS 5 MG TABS tablet TAKE 1 TABLET(5 MG) BY MOUTH TWICE DAILY 60 tablet 11   escitalopram  (LEXAPRO) 20 MG tablet Take 20 mg by mouth daily.     hydrOXYzine (ATARAX/VISTARIL) 25 MG tablet Take 25 mg by mouth at bedtime as needed (sleep).     insulin glargine (LANTUS) 100 UNIT/ML Solostar Pen Inject 22 Units into the skin daily. 15 mL 11   Insulin Pen Needle 32G X 4 MM MISC 1 each by Does not apply route as needed. 200 each 0   losartan-hydrochlorothiazide (HYZAAR) 100-12.5 MG tablet Take 1 tablet by mouth daily.     metFORMIN (GLUCOPHAGE) 500 MG tablet Take 1 tablet (500 mg total) by mouth 2 (two) times daily with a meal. 60 tablet 11   metoprolol succinate (TOPROL-XL) 50 MG 24 hr tablet Take 1 tablet (50 mg total) by mouth daily. Take with or immediately following a meal. (Patient taking differently: Take 25 mg by mouth daily. Take with or immediately following a meal.) 30 tablet 11   nitroGLYCERIN (NITROSTAT) 0.4 MG SL tablet Place 1 tablet (0.4 mg total) under the tongue every 5 (five) minutes as needed. For chest pain 30 tablet 0   traZODone (DESYREL) 100 MG tablet Take 100 mg by mouth at bedtime.     VASCEPA 1 g capsule TAKE 2 CAPSULES(2 GRAMS) BY MOUTH TWICE DAILY 120 capsule 1   busPIRone (BUSPAR) 5 MG tablet Take 5 mg by mouth 2 (two) times daily as needed (anxiety).     No facility-administered medications prior to visit.   Final Medications at End of Visit    Current Meds  Medication Sig   allopurinol (ZYLOPRIM) 100 MG tablet Take 100 mg by mouth daily.   amLODipine (NORVASC) 5 MG tablet Take 1 tablet (5 mg total) by mouth daily.   atorvastatin (LIPITOR) 80 MG tablet TAKE 1 TABLET BY MOUTH DAILY   blood glucose meter kit and supplies Dispense based on patient and insurance preference. Use up to four times daily as directed. (FOR ICD-10 E10.9, E11.9).   busPIRone (BUSPAR) 10 MG tablet Take 10 mg by mouth 2 (two) times daily as needed.   colchicine 0.6 MG tablet Take 0.6 mg by mouth daily as needed (gout flare).   ELIQUIS 5 MG TABS tablet TAKE 1 TABLET(5 MG) BY MOUTH TWICE  DAILY   escitalopram (LEXAPRO) 20 MG tablet Take 20 mg by mouth daily.   hydrOXYzine (ATARAX/VISTARIL) 25 MG tablet Take 25 mg by mouth at bedtime as needed (sleep).   insulin glargine (LANTUS) 100 UNIT/ML Solostar Pen Inject 22 Units into the skin daily.   Insulin Pen Needle 32G X 4 MM MISC 1 each by Does not apply route as needed.   losartan-hydrochlorothiazide (HYZAAR) 100-12.5 MG tablet Take 1 tablet by mouth daily.   metFORMIN (GLUCOPHAGE) 500 MG tablet Take 1 tablet (500  mg total) by mouth 2 (two) times daily with a meal.   metoprolol succinate (TOPROL-XL) 50 MG 24 hr tablet Take 1 tablet (50 mg total) by mouth daily. Take with or immediately following a meal. (Patient taking differently: Take 25 mg by mouth daily. Take with or immediately following a meal.)   nitroGLYCERIN (NITROSTAT) 0.4 MG SL tablet Place 1 tablet (0.4 mg total) under the tongue every 5 (five) minutes as needed. For chest pain   traZODone (DESYREL) 100 MG tablet Take 100 mg by mouth at bedtime.   VASCEPA 1 g capsule TAKE 2 CAPSULES(2 GRAMS) BY MOUTH TWICE DAILY   Radiology:   CTA head and neck 02/11/2020: 1. Atherosclerotic changes at the bilateral carotid bifurcations. Focal area of bandlike stenosis in the right carotid bifurcation resulting in approximately 90% stenosis. 2. Atherosclerotic changes of the left carotid bifurcation resulting in approximately 70% stenosis. 3. No acute intracranial abnormality. Remote infarcts within the bilateral basal ganglia. Chronic microangiopathy. 4. Emphysema and aortic atherosclerosis.  Cardiac Studies:  Echocardiogram 04/29/2021: 1. Left ventricular ejection fraction, by estimation, is 55 to 60%. The left ventricle has normal function. The left ventricle has no regional wall motion abnormalities. Left ventricular diastolic parameters are consistent with Grade I diastolic dysfunction (impaired relaxation).   2. Right ventricular systolic function is normal. The right  ventricular size is normal.   3. The mitral valve is normal in structure. Trivial mitral valve regurgitation. No evidence of mitral stenosis.   4. The aortic valve is normal in structure. Aortic valve regurgitation is not visualized. Aortic valve sclerosis is present, with no evidence of aortic valve stenosis.   5. The inferior vena cava is normal in size with greater than 50% respiratory variability, suggesting right atrial pressure of 3 mmHg.  Echocardiogram 09/23/2020:  Left ventricle cavity is normal in size. Moderate concentric hypertrophy  of the left ventricle. Normal global wall motion. Normal LV systolic  function with EF 55%. Doppler evidence of grade I (impaired) diastolic  dysfunction, normal LAP.  Left atrial cavity is moderately dilated.  Trileaflet aortic valve.  Trace aortic regurgitation.  Moderate (Grade II) mitral regurgitation.  Mild to moderate tricuspid regurgitation. Estimated pulmonary artery  systolic pressure 29 mmHg.  Compared to previous study on 07/17/2020, no significant changes noted.  ABI 09/23/2020:  This exam reveals moderately decreased perfusion of the right lower  extremity, noted at the anterior tibial and post tibial artery level (ABI  0.69) and moderately decreased perfusion of the left lower extremity,  noted at the anterior tibial and post tibial artery level (ABI 0.72).  No significant change from 02/25/2016.   Carotid artery duplex  12/28/2019: Stenosis in the right internal carotid artery (80-99%). Stenosis in the right external carotid artery (>50%). Stenosis in the left internal carotid artery (80-99%). Stenosis in the left external carotid artery (<50%). The PSV internal/common carotid artery ratio is consistent with a stenosis of >70% bilaterally, right  7.89 and left 8.67.  Antegrade right vertebral artery flow. Antegrade left vertebral artery flow. Follow up in four months is appropriate if clinically indicated. Peak velocity on the right  was 349/117 cm/s and 334/106 cm/s on the left. Compared to the study done on 07/18/2019, there is progression of disease in bilateral internal carotid artery.  Previously right ICA stenosis was 50 to 69% and left was >70%. See attached images.   EKG:  10/16/2021: Sinus bradycardia at a rate of 52 bpm.  Normal axis.  Poor R wave pression, cannot exclude  anteroseptal infarct old.  No evidence of ischemia or underlying injury pattern.  03/12/2021: Sinus rhythm at a rate of 65 bpm.  Normal axis.  Left atrial enlargement.  No evidence of ischemia or underlying injury pattern.  Compared to 09/09/2020, no significant change.  EKG 04/04/2020: Atrial fibrillation with controlled ventricular response at the rate of 67 bpm, normal axis, poor R wave progression, cannot exclude anteroseptal infarct old.  No evidence of ischemia.  No significant change from EKG 03/05/2020.  EKG 01/09/2020: Marked sinus bradycardia at rate of 46 bpm, m normal axis, poor R wave progression, probably normal variant.  No evidence of ischemia.    Assessment     ICD-10-CM   1. Paroxysmal atrial fibrillation (HCC)  I48.0 EKG 12-Lead    2. Primary hypertension  I10     3. Bilateral carotid artery stenosis - s/p bilateral carotid endartarectomy   I65.23     4. Peripheral artery disease (HCC)  I73.9      {CHA2DS2-VASc Score is 5.  Yearly risk of stroke: 7.2% (A, HTN, Vasc Dz, CVA).  Score of 1=0.6; 2=2.2; 3=3.2; 4=4.8; 5=7.2; 6=9.8; 7=>9.8) -(CHF; HTN; vasc disease DM,  Male = 1; Age <65 =0; 65-74 = 1,  >75 =2; stroke/embolism= 2).     Medications Discontinued During This Encounter  Medication Reason   busPIRone (BUSPAR) 5 MG tablet      No orders of the defined types were placed in this encounter.   Recommendations:   [name] is a 66 y.o.  Caucasian male with history of non-STEMI and angioplasty and stent, peripheral artery disease with interventions to his lower extremity in the remote past in [place], primary  hypertension, mixed hyperlipidemia, active smoking, active marijuana use, history of substance abuse remotely, hypertension, history of TIA and excessive alcohol use quit in July 2021, family history of premature coronary artery disease in which his father died at the age of 31 and his mother at age 55 from MI with CABG in her 58s.    Underwent left carotid endarterectomy on 03/04/2020 and was found to have postop atrial fibrillation with RVR.  Underwent successful right carotid endarterectomy 04/08/2020.   Since last office visit patient was admitted 04/17/2021 - 05/03/2021 for DKA, subsequently developed abdominal pain found to have ischemic bowel and underwent laparotomy.  Patient developed postsurgical ileus as well as lower GI bleed and AKI.  Eliquis was held for 2 days during admission, however he was discharged on anticoagulation.  Patient was diagnosed with new onset type 2 diabetes during this admission.  Patient was last seen in the office 03/12/2021 at which time ordered repeat lipid profile testing and resumed Toprol-XL 25 mg p.o. daily.  Lipids are now under excellent control.  He presents for 40-month follow-up.  Although patient is mildly bradycardic he is asymptomatic from the standpoint, we will continue low-dose Toprol-XL at 25 mg.  Blood pressure is well controlled.  Patient is fairly asymptomatic from a cardiovascular standpoint.  Patient has upcoming PCP appointment, will defer repeat lipid profile testing to primary care as I suspect triglycerides were elevated related to uncontrolled diabetes.  LDL is under excellent control.  Again counseled patient regarding tobacco cessation.  Our office will provide patient with vascular tree office number in order to schedule follow-up appointment for continued management of carotid artery stenosis.  No changes were made at today's office visit.  Follow-up in 6 months, sooner if needed.   Alethia Berthold, PA-C 10/16/2021, 12:09 PM Office:  [phone] 

## 2021-11-02 ENCOUNTER — Encounter: Payer: Medicare Other | Attending: Internal Medicine | Admitting: Dietician

## 2021-11-02 DIAGNOSIS — E1169 Type 2 diabetes mellitus with other specified complication: Secondary | ICD-10-CM | POA: Insufficient documentation

## 2021-11-02 NOTE — Progress Notes (Signed)
Diabetes Self-Management Education  Visit Type: Follow-up  Appt. Start Time: 1155 Appt. End Time: 1220  11/03/2021  This is a phone visit as patient forgot his appointment today. Pt is at home and I am in my office. Pt was last seen on 08/10/2021.  [name], identified by name and date of birth, is a 66 y.o. male with a diagnosis of Diabetes:  .   ASSESSMENT   Pt states he has lost weight and is currently 154 lb. His fasting blood glucose was '85mg'$ /dL this morning, and post-meal was '102mg'$ /dL. Pt states he has had hypoglycemia one time last week which he treated with juice.  He states he has been having problems sleeping.  Reports walking daily.   History includes Type 2 diabetes, HTN, HLD, CKD, CVA, MI, CAD, depression, recent GI bleed with surgery (ileocecectomy 04/19/2021. Continues to smoke.  Son and wife both smoke. A1C >15.5% 04/19/21 Medications:  metformin, Lantus 22 units q am   156 lbs 08/10/2021 169 lbs 04/04/2021   Patient lives with his wife.  His son also lives with them. They share shopping and cooking.  He is retired and used to be a Administrator.  He states that he has a learning disability and has enquired about testing.  He states that he can read but can comprehend. [place] and has started walking. There were no vitals taken for this visit. There is no height or weight on file to calculate BMI.   Diabetes Self-Management Education - 11/03/21 0929       Visit Information   Visit Type Follow-up      Psychosocial Assessment   What is the hardest part about your diabetes right now, causing you the most concern, or is the most worrisome to you about your diabetes?   Making healty food and beverage choices      Pre-Education Assessment   Patient understands the diabetes disease and treatment process. Comprehends key points    Patient understands incorporating nutritional management into lifestyle. Comprehends key points    Patient  undertands incorporating physical activity into lifestyle. Demonstrates understanding / competency    Patient understands using medications safely. Demonstrates understanding / competency    Patient understands monitoring blood glucose, interpreting and using results Comprehends key points    Patient understands prevention, detection, and treatment of acute complications. Needs Instruction    Patient understands prevention, detection, and treatment of chronic complications. Needs Instruction    Patient understands how to develop strategies to address psychosocial issues. Needs Instruction    Patient understands how to develop strategies to promote health/change behavior. Needs Instruction      Complications   How often do you check your blood sugar? 1-2 times/day    Fasting Blood glucose range (mg/dL) 70-129    Postprandial Blood glucose range (mg/dL) 70-129    Number of hypoglycemic episodes per month 1    Can you tell when your blood sugar is low? Yes    What do you do if your blood sugar is low? drinks juice      Dietary Intake   Breakfast cornflakes with sweet and low and 2% milk    Snack (morning) none    Lunch tomato sandwich on [employer] bread with tangerine    Snack (afternoon) none    Dinner grilled chicken, corn, peas    Snack (evening) none    Beverage(s) water, pepsi zero, coffee      Activity / Exercise  Activity / Exercise Type Light (walking / raking leaves)    How many days per week do you exercise? 7    How many minutes per day do you exercise? 30    Total minutes per week of exercise 210      Patient Education   Previous Diabetes Education Yes (please comment)   07/2021   Healthy Eating Role of diet in the treatment of diabetes and the relationship between the three main macronutrients and blood glucose level;Plate Method    Being Active Role of exercise on diabetes management, blood pressure control and cardiac health.    Medications Reviewed patients medication for  diabetes, action, purpose, timing of dose and side effects.    Acute complications Taught prevention, symptoms, and  treatment of hypoglycemia - the 15 rule.      Individualized Goals (developed by patient)   Nutrition General guidelines for healthy choices and portions discussed    Physical Activity Exercise 5-7 days per week;30 minutes per day    Medications take my medication as prescribed    Monitoring  Test my blood glucose as discussed    Problem Solving Sleep Pattern    Reducing Risk treat hypoglycemia with 15 grams of carbs if blood glucose less than '70mg'$ /dL;stop smoking      Patient Self-Evaluation of Goals - Patient rates self as meeting previously set goals (% of time)   Nutrition 50 - 75 % (half of the time)    Physical Activity >75% (most of the time)    Medications >75% (most of the time)    Monitoring >75% (most of the time)    Problem Solving and behavior change strategies  50 - 75 % (half of the time)    Reducing Risk (treating acute and chronic complications) >21% (most of the time)    Health Coping >75% (most of the time)      Post-Education Assessment   Patient understands the diabetes disease and treatment process. Demonstrates understanding / competency    Patient understands incorporating nutritional management into lifestyle. Comprehends key points    Patient undertands incorporating physical activity into lifestyle. Demonstrates understanding / competency    Patient understands using medications safely. Demonstrates understanding / competency    Patient understands monitoring blood glucose, interpreting and using results Demonstrates understanding / competency    Patient understands prevention, detection, and treatment of acute complications. Comprehends key points    Patient understands prevention, detection, and treatment of chronic complications. Comprehends key points    Patient understands how to develop strategies to address psychosocial issues. Comprehends  key points    Patient understands how to develop strategies to promote health/change behavior. Comprehends key points      Outcomes   Expected Outcomes Demonstrated interest in learning. Expect positive outcomes    Future DMSE 3-4 months    Program Status Not Completed      Subsequent Visit   Since your last visit have you continued or begun to take your medications as prescribed? Yes    Since your last visit have you experienced any weight changes? Loss    Weight Loss (lbs) 2    Since your last visit, are you checking your blood glucose at least once a day? Yes             Individualized Plan for Diabetes Self-Management Training:   Learning Objective:  Patient will have a greater understanding of diabetes self-management. Patient education plan is to attend individual and/or group sessions per assessed  needs and concerns.   Plan:   Patient Instructions  Doristine Devoid job on the changes that you have made! Continue to be active and walk daily. Continue to take your medication as prescribed. Treat a blood sugar less than 70 with 1/2 cup juice Speak to your doctor if you are having more than 2-3 lows per week. Continue to bake rather than fry most often.   Consider quitting smoking.  Expected Outcomes:  Demonstrated interest in learning. Expect positive outcomes  Education material provided:   If problems or questions, patient to contact team via:  Phone  Future DSME appointment: 3-4 months

## 2021-11-02 NOTE — Patient Instructions (Signed)
Great job on the changes that you have made! Continue to be active and walk daily. Continue to take your medication as prescribed. Treat a blood sugar less than 70 with 1/2 cup juice Speak to your doctor if you are having more than 2-3 lows per week. Continue to bake rather than fry most often.   Consider quitting smoking.

## 2021-11-12 ENCOUNTER — Other Ambulatory Visit: Payer: Self-pay | Admitting: *Deleted

## 2021-11-12 DIAGNOSIS — I6523 Occlusion and stenosis of bilateral carotid arteries: Secondary | ICD-10-CM

## 2021-11-18 ENCOUNTER — Ambulatory Visit (HOSPITAL_COMMUNITY): Payer: Medicare Other | Attending: Vascular Surgery

## 2021-11-18 ENCOUNTER — Ambulatory Visit: Payer: Medicare Other

## 2021-11-19 ENCOUNTER — Ambulatory Visit (HOSPITAL_COMMUNITY)
Admission: RE | Admit: 2021-11-19 | Discharge: 2021-11-19 | Disposition: A | Discharge status: Home / Self Care | Payer: Medicare Other | Source: Ambulatory Visit | Attending: Vascular Surgery | Admitting: Vascular Surgery

## 2021-11-19 ENCOUNTER — Ambulatory Visit (INDEPENDENT_AMBULATORY_CARE_PROVIDER_SITE_OTHER): Payer: Medicare Other | Admitting: Physician Assistant

## 2021-11-19 VITALS — BP 106/56 | HR 47 | Temp 97.7°F | Resp 16 | Ht 66.0 in | Wt 147.0 lb

## 2021-11-19 DIAGNOSIS — I6523 Occlusion and stenosis of bilateral carotid arteries: Secondary | ICD-10-CM

## 2021-11-20 ENCOUNTER — Encounter: Payer: Self-pay | Admitting: Physician Assistant

## 2021-11-20 NOTE — Progress Notes (Signed)
Office Note     CC:  follow up Requesting Provider:  [name], MD  HPI: [name] is a 66 y.o. (1955-05-29) male who presents for surveillance of carotid artery stenosis.  He underwent staged carotid endarterectomies due to bilateral asymptomatic high-grade stenosis in December 2021 and January 2022 by Dr. Donnetta Hutching.  Since last office visit he has not had a diagnosis of TIA or CVA.  He denies any strokelike symptoms including slurring speech, changes in vision, or one-sided weakness.  He is on a statin daily.  He is a daily tobacco user.  He is on Eliquis for paroxysmal atrial fibrillation.   Past Medical History:  Diagnosis Date   AKI (acute kidney injury) ([place]) 12/2015   Anxiety    Arthritis    Carotid artery stenosis    Coronary artery disease    Dementia ([place])    problems with short term memory   Depression    DKA (diabetic ketoacidosis) ([place]) 04/16/2021   Dysrhythmia    episode of afib 12/2005 in setting of cocaine use   Gout    Hyperlipidemia    Hypertension    Myocardial infarction [facility])    2017   PAD (peripheral artery disease) ([facility])    Stroke ([facility])    TIA   TIA (transient ischemic attack)    over 20 years ago    Past Surgical History:  Procedure Laterality Date   CARDIAC CATHETERIZATION N/A 12/18/2015   Procedure: Left Heart Cath and Coronary Angiography;  Surgeon: Adrian Prows, MD;  Location: [place] [facility];  Service: Cardiovascular;  Laterality: N/A;   CARDIAC CATHETERIZATION N/A 12/18/2015   Procedure: Coronary Stent Intervention;  Surgeon: Adrian Prows, MD;  Location: [place] [facility];  Service: Cardiovascular;  Laterality: N/A;   CARDIAC CATHETERIZATION N/A 12/18/2015   Procedure: Coronary Balloon Angioplasty;  Surgeon: Adrian Prows, MD;  Location: [place] [facility];  Service: Cardiovascular;  Laterality: N/A;   COLONOSCOPY N/A 01/12/2016   Procedure: COLONOSCOPY;  Surgeon: Manus Gunning, MD;  Location: [facility] ENDOSCOPY;  Service: Gastroenterology;   Laterality: N/A;   ENDARTERECTOMY Left 03/04/2020   Procedure: LEFT ENDARTERECTOMY CAROTID;  Surgeon: Rosetta Posner, MD;  Location: [facility] OR;  Service: Vascular;  Laterality: Left;   ENDARTERECTOMY Right 04/22/2020   Procedure: RIGHT CAROTID ENDARTERECTOMY;  Surgeon: Rosetta Posner, MD;  Location: [place];  Service: Vascular;  Laterality: Right;   HERNIA REPAIR     LAPAROSCOPY N/A 04/19/2021   Procedure: LAPAROSCOPY DIAGNOSTIC;  Surgeon: Felicie Morn, MD;  Location: [place];  Service: General;  Laterality: N/A;   LAPAROTOMY N/A 04/19/2021   Procedure: EXPLORATORY LAPAROTOMY, ILEOCECECTOMY;  Surgeon: Felicie Morn, MD;  Location: [place];  Service: General;  Laterality: N/A;   PATCH ANGIOPLASTY Left 03/04/2020   Procedure: PATCH ANGIOPLASTY;  Surgeon: Rosetta Posner, MD;  Location: [place];  Service: Vascular;  Laterality: Left;   PERIPHERAL VASCULAR CATHETERIZATION N/A 12/18/2015   Procedure: Abdominal Aortogram;  Surgeon: Adrian Prows, MD;  Location: [place] [facility];  Service: Cardiovascular;  Laterality: N/A;    Social History   Socioeconomic History   Marital status: Married    Spouse name: Not on file   Number of children: 2   Years of education: Not on file   Highest education level: Not on file  Occupational History   Not on file  Tobacco Use   Smoking status: Every Day    Packs/day: 1.00    Years: 21.00  Total pack years: 21.00    Types: Cigarettes   Smokeless tobacco: Never  Vaping Use   Vaping Use: Never used  Substance and Sexual Activity   Alcohol use: Not Currently    Comment: quit drinking 9 months ago   Drug use: Yes    Types: Marijuana    Comment: 2-3 times a week   Sexual activity: Not on file  Other Topics Concern   Not on file  Social History Narrative   Not on file   Social Determinants of Health   Financial Resource Strain: Not on file  Food Insecurity: Not on file  Transportation Needs: Not on file  Physical Activity: Not on file  Stress: Not on  file  Social Connections: Not on file  Intimate Partner Violence: Not on file    Family History  Problem Relation Age of Onset   Heart attack Mother    Heart attack Father    IgA nephropathy Son     Current Outpatient Medications  Medication Sig Dispense Refill   allopurinol (ZYLOPRIM) 100 MG tablet Take 100 mg by mouth daily.     amLODipine (NORVASC) 5 MG tablet Take 1 tablet (5 mg total) by mouth daily. 30 tablet 3   atorvastatin (LIPITOR) 80 MG tablet TAKE 1 TABLET BY MOUTH DAILY 90 tablet 3   blood glucose meter kit and supplies Dispense based on patient and insurance preference. Use up to four times daily as directed. (FOR ICD-10 E10.9, E11.9). 1 each 0   busPIRone (BUSPAR) 10 MG tablet Take 10 mg by mouth 2 (two) times daily as needed.     colchicine 0.6 MG tablet Take 0.6 mg by mouth daily as needed (gout flare).     ELIQUIS 5 MG TABS tablet TAKE 1 TABLET(5 MG) BY MOUTH TWICE DAILY 60 tablet 11   escitalopram (LEXAPRO) 20 MG tablet Take 20 mg by mouth daily.     hydrOXYzine (ATARAX/VISTARIL) 25 MG tablet Take 25 mg by mouth at bedtime as needed (sleep).     insulin glargine (LANTUS) 100 UNIT/ML Solostar Pen Inject 22 Units into the skin daily. 15 mL 11   Insulin Pen Needle 32G X 4 MM MISC 1 each by Does not apply route as needed. 200 each 0   losartan-hydrochlorothiazide (HYZAAR) 100-12.5 MG tablet Take 1 tablet by mouth daily.     metFORMIN (GLUCOPHAGE) 500 MG tablet Take 1 tablet (500 mg total) by mouth 2 (two) times daily with a meal. 60 tablet 11   metoprolol succinate (TOPROL-XL) 50 MG 24 hr tablet Take 1 tablet (50 mg total) by mouth daily. Take with or immediately following a meal. (Patient taking differently: Take 25 mg by mouth daily. Take with or immediately following a meal.) 30 tablet 11   nitroGLYCERIN (NITROSTAT) 0.4 MG SL tablet Place 1 tablet (0.4 mg total) under the tongue every 5 (five) minutes as needed. For chest pain 30 tablet 0   traZODone (DESYREL) 100 MG  tablet Take 100 mg by mouth at bedtime.     VASCEPA 1 g capsule TAKE 2 CAPSULES(2 GRAMS) BY MOUTH TWICE DAILY 120 capsule 1   No current facility-administered medications for this visit.    Allergies  Allergen Reactions   Vicodin [Hydrocodone-Acetaminophen] Nausea And Vomiting     REVIEW OF SYSTEMS:   '[X]'  denotes positive finding, '[ ]'  denotes negative finding Cardiac  Comments:  Chest pain or chest pressure:    Shortness of breath upon exertion:    Short of breath  when lying flat:    Irregular heart rhythm:        Vascular    Pain in calf, thigh, or hip brought on by ambulation:    Pain in feet at night that wakes you up from your sleep:     Blood clot in your veins:    Leg swelling:         Pulmonary    Oxygen at home:    Productive cough:     Wheezing:         Neurologic    Sudden weakness in arms or legs:     Sudden numbness in arms or legs:     Sudden onset of difficulty speaking or slurred speech:    Temporary loss of vision in one eye:     Problems with dizziness:         Gastrointestinal    Blood in stool:     Vomited blood:         Genitourinary    Burning when urinating:     Blood in urine:        Psychiatric    Major depression:         Hematologic    Bleeding problems:    Problems with blood clotting too easily:        Skin    Rashes or ulcers:        Constitutional    Fever or chills:      PHYSICAL EXAMINATION:  Vitals:   11/19/21 1458 11/19/21 1504  BP: (!) 99/59 (!) 106/56  Pulse: (!) 47 (!) 47  Resp: 16   Temp: 97.7 F (36.5 C)   TempSrc: Temporal   SpO2: 97%   Weight: 147 lb (66.7 kg)   Height: '5\' 6"'  (1.676 m)     General:  WDWN in NAD; vital signs documented above Gait: Not observed HENT: WNL, normocephalic Pulmonary: normal non-labored breathing , without Rales, rhonchi,  wheezing Cardiac: regular HR Abdomen: soft, NT, no masses Skin: without rashes Vascular Exam/Pulses:  Right Left  Radial 2+ (normal) 2+ (normal)   DP 2+ (normal) 2+ (normal)   Extremities: without ischemic changes, without Gangrene , without cellulitis; without open wounds;  Musculoskeletal: no muscle wasting or atrophy  Neurologic: A&O X 3;  CN grossly intact Psychiatric:  The pt has Normal affect.   Non-Invasive Vascular Imaging:   Bilateral carotid endarterectomy site without flow-limiting stenosis    ASSESSMENT/PLAN:: 66 y.o. male here for follow up for surveillance of carotid artery stenosis status post bilateral carotid endarterectomies  -Subjectively, he has not had any neurological symptoms since last office visit -Carotid duplex demonstrates widely patent carotid endarterectomy sites bilaterally -Continue statin daily -Encouraged smoking cessation -Recheck carotid duplex in 1 year per protocol   Dagoberto Ligas, PA-C [facility] [phone]  Clinic MD:   Scot Dock

## 2022-02-01 ENCOUNTER — Ambulatory Visit: Payer: Medicare Other | Admitting: Dietician

## 2022-04-15 ENCOUNTER — Telehealth: Payer: Self-pay

## 2022-04-15 ENCOUNTER — Ambulatory Visit: Payer: 59 | Admitting: Cardiology

## 2022-04-15 ENCOUNTER — Encounter: Payer: Self-pay | Admitting: Cardiology

## 2022-04-15 VITALS — BP 113/58 | HR 54 | Resp 16 | Ht 66.0 in | Wt 148.5 lb

## 2022-04-15 DIAGNOSIS — I480 Paroxysmal atrial fibrillation: Secondary | ICD-10-CM

## 2022-04-15 DIAGNOSIS — I10 Essential (primary) hypertension: Secondary | ICD-10-CM

## 2022-04-15 DIAGNOSIS — K4090 Unilateral inguinal hernia, without obstruction or gangrene, not specified as recurrent: Secondary | ICD-10-CM

## 2022-04-15 DIAGNOSIS — I2510 Atherosclerotic heart disease of native coronary artery without angina pectoris: Secondary | ICD-10-CM

## 2022-04-15 DIAGNOSIS — E782 Mixed hyperlipidemia: Secondary | ICD-10-CM

## 2022-04-15 DIAGNOSIS — I739 Peripheral vascular disease, unspecified: Secondary | ICD-10-CM

## 2022-04-15 NOTE — Progress Notes (Signed)
Primary Physician/Referring:  Roselee Nova, MD  Patient ID: [name], male    DOB: 1956/01/10, 67 y.o.   MRN: [id number]  Chief Complaint  Patient presents with   Coronary Artery Disease   Hyperlipidemia   Hypertension   Follow-up    6 months   HPI:    [name]  is a 67 y.o. 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  Patient also has history of postop A-fib RVR after carotid endarterectomy and presently on chronic Eliquis.    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  This is 2-monthoffice visit, patient states that he is doing well and he has not had any recurrence of angina pectoris.  He is tolerating all his medications well.  He has not had any bleeding complications from Eliquis.  Denies symptoms of claudication or recurrence of TIA.  Past Medical History:  Diagnosis Date   AKI (acute kidney injury) ([facility] 12/2015   Anxiety    Arthritis    Carotid artery stenosis    Coronary artery disease    D[place]    problems with short term memory   Depression    DKA (diabetic [place]acidosis) ([place] 04/16/2021   Dysrhythmia    episode of afib 12/2005 in setting of cocaine use   Gout    Hyperlipidemia    Hypertension    Myocardial infarction ([facility]    2017   PAD (peripheral artery  disease) (HCC)    Stroke (HCC)    TIA   TIA (transient ischemic attack)    over 20 years ago   Past Surgical History:  Procedure Laterality Date   CARDIAC CATHETERIZATION N/A 12/18/2015   Procedure: Left Heart Cath and Coronary Angiography;  Surgeon: JAdrian Prows MD;  [facility];  Service: Cardiovascular;  Laterality: N/A;   CARDIAC CATHETERIZATION N/A 12/18/2015   Procedure: Coronary Stent Intervention;  Surgeon: JAdrian Prows MD;  [facility];  Service: Cardiovascular;  Laterality: N/A;   CARDIAC CATHETERIZATION N/A 12/18/2015   Procedure: Coronary Balloon Angioplasty;  Surgeon: JAdrian Prows MD;  [facility];  Service: Cardiovascular;  Laterality: N/A;   COLONOSCOPY N/A 01/12/2016   Procedure: COLONOSCOPY;  Surgeon: SManus Gunning MD;  [facility];  Service: Gastroenterology;  Laterality: N/A;   ENDARTERECTOMY Left 03/04/2020   Procedure: LEFT ENDARTERECTOMY CAROTID;  Surgeon: ERosetta Posner MD;  [facility];  Service: Vascular;  Laterality: Left;   ENDARTERECTOMY Right 04/22/2020   Procedure: RIGHT CAROTID ENDARTERECTOMY;  Surgeon: ERosetta Posner MD;  [place]  Service: Vascular;  Laterality: Right;   HERNIA REPAIR     LAPAROSCOPY N/A 04/19/2021   Procedure: LAPAROSCOPY DIAGNOSTIC;  Surgeon: SFelicie Morn MD;  [place]  Service: General;  Laterality: N/A;   LAPAROTOMY N/A 04/19/2021  Procedure: EXPLORATORY LAPAROTOMY, ILEOCECECTOMY;  Surgeon: Felicie Morn, MD;  Location: [place];  Service: General;  Laterality: N/A;   PATCH ANGIOPLASTY Left 03/04/2020   Procedure: PATCH ANGIOPLASTY;  Surgeon: Rosetta Posner, MD;  Location: [place];  Service: Vascular;  Laterality: Left;   PERIPHERAL VASCULAR CATHETERIZATION N/A 12/18/2015   Procedure: Abdominal Aortogram;  Surgeon: Adrian Prows, MD;  Location: [place] [facility];  Service: Cardiovascular;  Laterality: N/A;   Family History  Problem Relation Age of  Onset   Heart attack Mother    Heart attack Father    IgA nephropathy Son     Social History   Tobacco Use   Smoking status: Every Day    Packs/day: 1.00    Years: 21.00    Total pack years: 21.00    Types: Cigarettes   Smokeless tobacco: Never  Substance Use Topics   Alcohol use: Not Currently    Comment: quit drinking 9 months ago   Marital Status: Legally Separated  ROS  Review of Systems  Cardiovascular:  Positive for dyspnea on exertion. Negative for chest pain and leg swelling.   Objective  Blood pressure (!) 113/58, pulse (!) 54, resp. rate 16, height '5\' 6"'$  (1.676 m), SpO2 97 %.     04/15/2022   11:36 AM 11/19/2021    3:04 PM 11/19/2021    2:58 PM  Vitals with BMI  Height '5\' 6"'$   '5\' 6"'$   Weight   147 lbs  BMI   17.51  Systolic 025 852 99  Diastolic 58 56 59  Pulse 54 47 47     Physical Exam Neck:     Vascular: Carotid bruit (bilateral) present. No JVD.  Cardiovascular:     Rate and Rhythm: Normal rate and regular rhythm.     Pulses: Intact distal pulses.          Dorsalis pedis pulses are 1+ on the right side and 1+ on the left side.       Posterior tibial pulses are 1+ on the right side and 1+ on the left side.     Heart sounds: Normal heart sounds. No murmur heard.    No gallop.  Pulmonary:     Effort: Pulmonary effort is normal.     Breath sounds: Normal breath sounds.  Abdominal:     General: Bowel sounds are normal.     Palpations: Abdomen is soft.  Musculoskeletal:     Right lower leg: No edema.     Left lower leg: No edema.    Laboratory examination:   Recent Labs    04/28/21 0354 04/29/21 0500 04/30/21 0415 09/16/21 1446  NA 139 137 138 139  K 4.6 4.5 4.3 3.8  CL 110 107 107 108  CO2 '23 23 24 21  '$ GLUCOSE 128* 160* 132* 88  BUN '12 16 9 11  '$ CREATININE 0.95 1.18 1.01 0.96  CALCIUM 8.0* 8.2* 7.9* 8.9  GFRNONAA >60 >60 >60  --       Latest Ref Rng & Units 09/16/2021    2:46 PM 04/30/2021    4:15 AM 04/29/2021    5:00 AM  CMP  Glucose  65 - 99 mg/dL 88  132  160   BUN 7 - 25 mg/dL '11  9  16   '$ Creatinine 0.70 - 1.35 mg/dL 0.96  1.01  1.18   Sodium 135 - 146 mmol/L 139  138  137   Potassium 3.5 - 5.3 mmol/L 3.8  4.3  4.5  Chloride 98 - 110 mmol/L 108  107  107   CO2 20 - 32 mmol/L '21  24  23   '$ Calcium 8.6 - 10.3 mg/dL 8.9  7.9  8.2   Total Protein 6.1 - 8.1 g/dL 6.8  4.4    Total Bilirubin 0.2 - 1.2 mg/dL 0.4  0.4    Alkaline Phos 38 - 126 U/L  72    AST 10 - 35 U/L 18  25    ALT 9 - 46 U/L 14  23        Latest Ref Rng & Units 09/16/2021    2:46 PM 05/03/2021    3:42 AM 05/02/2021    3:55 AM  CBC  WBC 3.8 - 10.8 Thousand/uL 8.6  7.6  7.7   Hemoglobin 13.2 - 17.1 g/dL 12.8  9.3  9.1   Hematocrit 38.5 - 50.0 % 38.6  28.8  28.0   Platelets 140 - 400 Thousand/uL 197  158  151    Lipid Panel Recent Labs    04/25/21 0106 04/27/21 0238 09/16/21 1446  CHOL  --   --  73  TRIG 67 73 56  LDLCALC  --   --  18  HDL  --   --  41  CHOLHDL  --   --  1.8     HEMOGLOBIN A1C Lab Results  Component Value Date   HGBA1C >15.5 (H) 04/19/2021   MPG >398 04/19/2021   TSH Recent Labs    04/17/21 0211 09/16/21 1446  TSH 0.636 1.72   External labs:  Lipoprotein (a) <75.0 nmol/L 15.1, apolipoprotein B normal at 44.   Date 07/11/2019  Cholesterol, total 73.000 mg 09/16/2021 HDL 41.000 mg 09/16/2021 LDL 18.000 mg 09/16/2021 Triglycerides 56.000 mg 09/16/2021  A1C 6.200 % 07/11/2019 TSH 1.720 09/16/2021  Hemoglobin 12.800 g/d 09/16/2021 Platelets 197.000 Th 09/16/2021  Creatinine, Serum 0.960 mg/ 09/16/2021 Potassium 3.800 mm 09/16/2021  ALT (SGPT) 14.000 U/L 09/16/2021     Allergies   Allergies  Allergen Reactions   Vicodin [Hydrocodone-Acetaminophen] Nausea And Vomiting     Medications    Current Outpatient Medications:    amLODipine (NORVASC) 5 MG tablet, Take 1 tablet (5 mg total) by mouth daily., Disp: 30 tablet, Rfl: 3   atorvastatin (LIPITOR) 80 MG tablet, TAKE 1 TABLET BY MOUTH DAILY, Disp: 90 tablet,  Rfl: 3   blood glucose meter kit and supplies, Dispense based on patient and insurance preference. Use up to four times daily as directed. (FOR ICD-10 E10.9, E11.9)., Disp: 1 each, Rfl: 0   busPIRone (BUSPAR) 10 MG tablet, Take 10 mg by mouth 2 (two) times daily as needed., Disp: , Rfl:    colchicine 0.6 MG tablet, Take 0.6 mg by mouth daily as needed (gout flare)., Disp: , Rfl:    ELIQUIS 5 MG TABS tablet, TAKE 1 TABLET(5 MG) BY MOUTH TWICE DAILY, Disp: 60 tablet, Rfl: 11   escitalopram (LEXAPRO) 20 MG tablet, Take 20 mg by mouth daily., Disp: , Rfl:    hydrOXYzine (ATARAX/VISTARIL) 25 MG tablet, Take 25 mg by mouth at bedtime as needed (sleep)., Disp: , Rfl:    insulin glargine (LANTUS) 100 UNIT/ML Solostar Pen, Inject 22 Units into the skin daily. (Patient taking differently: Inject 10 Units into the skin daily.), Disp: 15 mL, Rfl: 11   Insulin Pen Needle 32G X 4 MM MISC, 1 each by Does not apply route as needed., Disp: 200 each, Rfl: 0   losartan-hydrochlorothiazide (HYZAAR) 100-12.5 MG tablet, Take  1 tablet by mouth daily., Disp: , Rfl:    metFORMIN (GLUCOPHAGE) 500 MG tablet, Take 1 tablet (500 mg total) by mouth 2 (two) times daily with a meal. (Patient taking differently: Take 500 mg by mouth daily with breakfast.), Disp: 60 tablet, Rfl: 11   metoprolol succinate (TOPROL-XL) 50 MG 24 hr tablet, Take 1 tablet (50 mg total) by mouth daily. Take with or immediately following a meal. (Patient taking differently: Take 25 mg by mouth daily. Take with or immediately following a meal.), Disp: 30 tablet, Rfl: 11   nitroGLYCERIN (NITROSTAT) 0.4 MG SL tablet, Place 1 tablet (0.4 mg total) under the tongue every 5 (five) minutes as needed. For chest pain, Disp: 30 tablet, Rfl: 0   VASCEPA 1 g capsule, TAKE 2 CAPSULES(2 GRAMS) BY MOUTH TWICE DAILY, Disp: 120 capsule, Rfl: 1   allopurinol (ZYLOPRIM) 100 MG tablet, Take 100 mg by mouth daily., Disp: , Rfl:    Radiology:   CTA head and neck 02/11/2020: 1.  Atherosclerotic changes at the bilateral carotid bifurcations. Focal area of bandlike stenosis in the right carotid bifurcation resulting in approximately 90% stenosis. 2. Atherosclerotic changes of the left carotid bifurcation resulting in approximately 70% stenosis. 3. No acute intracranial abnormality. Remote infarcts within the bilateral basal ganglia. Chronic microangiopathy. 4. Emphysema and aortic atherosclerosis.  Cardiac Studies:   Left Heart Catheterization 12/18/2015:  Successful PTCA and stenting of the proximal and mid LAD with 2 overlapping 4.0 x 22 and 3.5 x 12 mm resolute integrity DES, stenosis reduced from 99% to 0%. PTCA and balloon angioplasty of the distal PDA with a 2.5 x 15 mm Emerge with reduction of stenosis from 90% to less than 30%. 1. Severe diffuse coronary artery disease, scattered disease evident throughout the RCA, PDA has tandem 60% and a 90% stenosis. Distal lesion reduced to less than 30% with balloon angioplasty. 2. High-grade complex 99% stenosis of the proximal LAD. Successful angioplasty as noted above. 3. Mild disease in the circumflex with intermediate lesions 60-70% in OM 3 and OM 4 and distal circumflex AV groove branch. 4. Abdominal aortogram: No evidence of abdominal aortic aneurysm. There are 2 renal arteries on the left, one renal artery on the right. A renal artery stent is evident on the right, renal artery is visible and patent, in-stent restenosis is present could not be evaluated due to nonselective nature of abdominal aortogram. Consider other modalities to evaluate renal artery stenosis, selective renal arteriogram was not performed as patient was not consented and also procedure performed through radial access.   12/18/2015: proximal and mid LAD with 2 overlapping 4.0 x 22 and 3.5 x 12 mm resolute integrity DES  ABI 09/23/2020:  This exam reveals moderately decreased perfusion of the right lower  extremity, noted at the anterior tibial and  post tibial artery level (ABI  0.69) and moderately decreased perfusion of the left lower extremity,  noted at the anterior tibial and post tibial artery level (ABI 0.72).  No significant change from 02/25/2016.   Carotid artery duplex 11/19/2021: 1 to 39% right ICA stenosis, right external carotid artery <50% stenosis. 1 to 39% left ICA stenosis.  Left external carotid artery occluded. Bilateral carotid endarterectomy site widely patent. Antegrade vertebral artery flow.  Echocardiogram 04/29/2021: 1. Left ventricular ejection fraction, by estimation, is 55 to 60%. The left ventricle has normal function. The left ventricle has no regional wall motion abnormalities. Left ventricular diastolic parameters are consistent with Grade I diastolic dysfunction (impaired relaxation).  2. Right ventricular systolic function is normal. The right ventricular size is normal.   3. The mitral valve is normal in structure. Trivial mitral valve regurgitation. No evidence of mitral stenosis.   4. The aortic valve is normal in structure. Aortic valve regurgitation is not visualized. Aortic valve sclerosis is present, with no evidence of aortic valve stenosis.   5. The inferior vena cava is normal in size with greater than 50% respiratory variability, suggesting right atrial pressure of 3 mmHg.  EKG:   EKG 04/15/2022: Sinus bradycardia at rate of 51 bpm, otherwise normal EKG.  Compared to 10/16/2021, no change.  EKG 04/04/2020: Atrial fibrillation with controlled ventricular response at the rate of 67 bpm, normal axis, poor R wave progression, cannot exclude anteroseptal infarct old.  No evidence of ischemia.  No significant change from EKG 03/05/2020.  Assessment     ICD-10-CM   1. Atherosclerosis of native coronary artery of native heart without angina pectoris  I25.10     2. Primary hypertension  I10     3. Paroxysmal atrial fibrillation (HCC)  I48.0 EKG 12-Lead    4. Peripheral artery disease (HCC)  I73.9      5. Mixed hyperlipidemia  E78.2     6. Right inguinal hernia  K40.90 Ambulatory referral to General Surgery     {CHA2DS2-VASc Score is 5.  Yearly risk of stroke: 7.2% (A, HTN, Vasc Dz, CVA).  Score of 1=0.6; 2=2.2; 3=3.2; 4=4.8; 5=7.2; 6=9.8; 7=>9.8) -(CHF; HTN; vasc disease DM,  Male = 1; Age <65 =0; 65-74 = 1,  >75 =2; stroke/embolism= 2).     Medications Discontinued During This Encounter  Medication Reason   traZODone (DESYREL) 100 MG tablet    No orders of the defined types were placed in this encounter.   Orders Placed This Encounter  Procedures   Ambulatory referral to General Surgery    Referral Priority:   Routine    Referral Type:   Surgical    Referral Reason:   Specialty Services Required    Referred to Provider:   Georganna Skeans, MD    Requested Specialty:   General Surgery    Number of Visits Requested:   1   EKG 12-Lead   Recommendations:   [name] is a 66 y.o.  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  Patient also has history of postop A-fib RVR after carotid endarterectomy and presently on chronic Eliquis.  1. Atherosclerosis of native coronary artery of native heart without angina pectoris Although patient has multiple cardiovascular factors, since he established with [place], he has been compliant with all his medications.  He has not had any recurrence of angina, dyspnea has remained stable.  In fact in January 2023,  underwent ileocecectomy due to intestinal obstruction when he presented with new onset diabetic ketoacidosis. Hence I do not think he needs repeat stress testing at this point. 2. Primary hypertension Since patient being compliant, blood pressure under excellent control.  Presently on losartan HCT and metoprolol succinate.  3. Paroxysmal atrial fibrillation (HCC) He is maintaining sinus rhythm.  He has not had any recurrence of atrial fibrillation even during ileocecectomy but did have A-fib during carotid endarterectomy.  In view of high chads vascular score, continue long-term anticoagulation, CBC remained stable.  Renal function is normal.  4. Peripheral artery disease ([place]) Peripheral artery disease has remained stable without symptoms of claudication.  Except for absent bilateral PT, vascular exam is normal.  5. Mixed hyperlipidemia Lipids under excellent control.  He is presently on atorvastatin 80 mg, continue the same.  6. Right inguinal hernia He has developed a large right inguinal hernia, surgical referral made today.  With regard to bilateral carotid endarterectomy, I reviewed his external carotid duplex, he has no residual stenosis.  Will consider surveillance duplex in probably a year or 2.  Otherwise I will like to see him back in 6 months for close monitoring in view of multiple complex medical issues and cardiac issues.  He is trying to quit smoking, presently smoking 1/2 pack of cigarettes a day.  This was a 40-minute office visit encounter, I had to review his hospitalization records to complete his charting and also reviewed external labs, discussion regarding smoking cessation, discussion regarding CAD and PAD and referral for inguinal hernia repair and coordination of care.   Adrian Prows, MD, [facility] 04/15/2022, 12:41 PM Office: [phone] Fax: [phone] Pager: [phone]

## 2022-04-15 NOTE — Telephone Encounter (Signed)
Patient's home blood pressure is overall fairly controlled on current antihypertensive regimen.  Average Systolic BP Level 412.8 mmHg Lowest Systolic BP Level 786 mmHg Highest Systolic BP Level 767 mmHg  Average Diastolic BP Level 20.94 mmHg Lowest Diastolic BP Level 65 mmHg Highest Diastolic BP Level 96 mmHg  Average Pulse Level 53.7 BPM Lowest Pulse Level 48 BPM Highest Pulse Level 62 BPM  04/15/2022 Thursday at 06:25 AM 128 / 74      04/14/2022 Wednesday at 08:42 PM 117 / 65      04/14/2022 Wednesday at 09:39 AM 148 / 87      04/13/2022 Tuesday at 08:02 PM 149 / 86      04/13/2022 Tuesday at 10:22 AM 143 / 80      04/12/2022 Monday at 07:23 PM 153 / 90      04/12/2022 Monday at 09:56 AM 142 / 83      04/11/2022 'Sunday at 09:14 PM 135 / 87      04/11/2022 Sunday at 10:03 AM 136 / 80      04/10/2022 Saturday at 07:06 PM 151 / 83      01'$ /13/2024 Saturday at 11:33 AM 128 / 78

## 2022-05-09 ENCOUNTER — Encounter: Payer: Self-pay | Admitting: Cardiology

## 2022-07-23 ENCOUNTER — Ambulatory Visit: Payer: Self-pay | Admitting: Surgery

## 2022-07-23 NOTE — Progress Notes (Signed)
Averages January 2024 BP Data Average Systolic BP Level 133.52 mmHg Lowest Systolic BP Level 103 mmHg Highest Systolic BP Level 166 mmHg  Feb 2024 Systolic Blood Pressure 127.7 (98.0 - 160.0) Diastolic Blood Pressure 72.9 (16.1 - 116.0) Heart Rate  53.5 (45.0 - 62.0)  Mar 2024 Systolic Blood Pressure 133.3 (98.0 - 170.0) Diastolic Blood Pressure 75.9 (09.6 - 116.0) Heart Rate  54.1 (48.0 - 64.0)  April 2024 Average BP 136/77 mmHg Average HR 55 bpm  Date Systolic Diastolic Units Heart Rate Units 04/29/22 6:53 PM 134 79 mmHg 56 bpm 04/30/22 5:46 AM 138 79 mmHg 60 bpm 04/30/22 7:15 PM 137 79 mmHg 52 bpm 05/01/22 7:06 AM 127 78 mmHg 55 bpm 05/01/22 8:11 PM 150 88 mmHg 50 bpm 05/02/22 7:34 AM 99 59 mmHg 57 bpm 05/02/22 6:23 PM 126 71 mmHg 49 bpm 05/03/22 9:15 AM 132 75 mmHg 51 bpm 05/03/22 7:07 PM 110 64 mmHg 54 bpm 05/04/22 6:07 AM 103 63 mmHg 55 bpm 05/04/22 7:56 PM 138 74 mmHg 51 bpm 05/05/22 8:09 AM 128 74 mmHg 51 bpm 05/05/22 5:30 PM 156 82 mmHg 45 bpm 05/06/22 10:16 AM 144 86 mmHg 50 bpm 05/07/22 10:04 AM 117 69 mmHg 54 bpm 05/07/22 9:45 PM 98 51 mmHg 54 bpm 05/08/22 6:28 AM 110 63 mmHg 56 bpm 05/08/22 6:40 PM 119 64 mmHg 56 bpm 05/09/22 7:02 AM 136 74 mmHg 53 bpm 05/09/22 7:44 PM 121 66 mmHg 52 bpm 05/10/22 6:44 AM 127 73 mmHg 51 bpm 05/10/22 8:57 PM 108 60 mmHg 56 bpm 05/11/22 9:34 AM 138 90 mmHg 54 bpm 05/12/22 9:50 AM 126 75 mmHg 51 bpm 05/12/22 6:54 PM 133 79 mmHg 49 bpm 05/13/22 8:57 AM 128 71 mmHg 53 bpm 05/13/22 6:27 PM 127 80 mmHg 54 bpm 05/14/22 10:47 AM 121 66 mmHg 51 bpm 05/14/22 5:58 PM 116 68 mmHg 53 bpm 05/15/22 11:28 AM 144 81 mmHg 52 bpm 05/15/22 8:07 PM 117 79 mmHg 57 bpm 05/16/22 8:10 AM 136 73 mmHg 52 bpm 05/16/22 9:16 PM 117 65 mmHg 58 bpm 05/17/22 9:08 AM 99 56 mmHg 62 bpm 05/17/22 6:48 PM 124 75 mmHg 51 bpm 05/18/22 7:28 AM 120 64 mmHg 58 bpm 05/18/22 6:10 PM 140 77 mmHg 48 bpm 05/19/22 7:13 AM 119 69 mmHg 54 bpm 05/19/22 6:45 PM 133 79 mmHg 50 bpm 05/20/22 10:14  AM 120 68 mmHg 50 bpm 05/20/22 11:55 PM 158 82 mmHg 55 bpm 05/21/22 7:11 AM 132 66 mmHg 55 bpm 05/21/22 7:01 PM 130 72 mmHg 57 bpm 05/22/22 7:44 AM 135 65 mmHg 52 bpm 05/22/22 7:02 PM 127 74 mmHg 54 bpm 05/23/22 9:40 AM 135 81 mmHg 55 bpm 05/23/22 5:38 PM 120 66 mmHg 54 bpm 05/24/22 9:12 AM 160 116 mmHg 60 bpm 05/24/22 9:15 AM 144 75 mmHg 51 bpm 05/24/22 7:19 PM 134 67 mmHg 52 bpm 05/25/22 6:15 AM 124 70 mmHg 59 bpm 05/25/22 7:09 PM 134 73 mmHg 55 bpm 05/26/22 8:51 AM 151 79 mmHg 57 bpm 05/26/22 10:23 PM 128 71 mmHg 57 bpm 05/27/22 9:04 AM 151 90 mmHg 51 bpm 05/27/22 11:06 AM 146 81 mmHg 53 bpm 05/27/22 10:15 PM 131 74 mmHg 57 bpm 05/28/22 8:13 AM 138 77 mmHg 52 bpm 05/28/22 7:32 PM 144 83 mmHg 60 bpm 05/29/22 9:25 AM 134 80 mmHg 53 bpm 05/29/22 6:33 PM 109 62 mmHg 53 bpm 05/30/22 9:24 AM 130 76 mmHg 57 bpm 05/30/22 6:53 PM 121 78 mmHg 55 bpm 05/31/22 11:14 AM 148 88  mmHg 59 bpm 05/31/22 7:18 PM 137 77 mmHg 58 bpm 06/01/22 10:00 AM 131 69 mmHg 55 bpm 06/01/22 6:57 PM 144 78 mmHg 62 bpm 06/02/22 7:07 AM 144 89 mmHg 54 bpm 06/02/22 5:32 PM 132 79 mmHg 50 bpm 06/03/22 6:55 AM 153 86 mmHg 54 bpm 06/03/22 9:44 PM 144 84 mmHg 57 bpm 06/04/22 8:20 AM 138 81 mmHg 56 bpm 06/04/22 5:42 PM 131 75 mmHg 55 bpm 06/05/22 5:52 AM 122 80 mmHg 51 bpm 06/05/22 6:38 PM 125 69 mmHg 53 bpm 06/06/22 6:31 AM 141 84 mmHg 52 bpm 06/06/22 7:12 PM 125 72 mmHg 55 bpm 06/07/22 9:17 AM 140 85 mmHg 51 bpm 06/07/22 6:37 PM 132 79 mmHg 57 bpm 06/08/22 10:03 AM 121 67 mmHg 52 bpm 06/08/22 7:25 PM 113 63 mmHg 61 bpm 06/09/22 10:52 AM 154 84 mmHg 54 bpm 06/09/22 6:50 PM 121 72 mmHg 50 bpm 06/10/22 10:28 AM 150 87 mmHg 53 bpm 06/10/22 6:14 PM 125 68 mmHg 64 bpm 06/11/22 9:43 AM 143 82 mmHg 50 bpm 06/11/22 6:54 PM 153 85 mmHg 54 bpm 06/12/22 5:50 AM 121 69 mmHg 56 bpm 06/12/22 7:52 PM 139 71 mmHg 63 bpm 06/13/22 8:19 AM 163 97 mmHg 58 bpm 06/13/22 8:23 AM 160 97 mmHg 58 bpm 06/13/22 9:07 PM 137 72 mmHg 61 bpm 06/14/22 10:53 AM 165 92 mmHg 57 bpm 06/14/22 2:42  PM 134 70 mmHg 53 bpm 06/14/22 6:35 PM 138 72 mmHg 56 bpm 06/14/22 10:20 PM 139 78 mmHg 54 bpm 06/15/22 9:25 AM 166 88 mmHg 54 bpm 06/15/22 6:21 PM 137 87 mmHg 53 bpm 06/16/22 9:23 AM 151 84 mmHg 54 bpm 06/16/22 6:54 PM 150 82 mmHg 56 bpm 06/17/22 11:49 AM 170 99 mmHg 51 bpm 06/17/22 12:37 PM 146 85 mmHg 48 bpm 06/17/22 6:52 PM 131 71 mmHg 51 bpm 06/18/22 6:05 AM 124 69 mmHg 53 bpm 06/18/22 7:03 PM 133 75 mmHg 54 bpm 06/19/22 11:38 AM 115 67 mmHg 55 bpm 06/19/22 5:29 PM 150 89 mmHg 64 bpm 06/20/22 9:04 AM 138 73 mmHg 50 bpm 06/20/22 6:55 PM 125 74 mmHg 51 bpm 06/21/22 11:05 AM 138 77 mmHg 50 bpm 06/21/22 8:44 PM 119 63 mmHg 56 bpm 06/22/22 11:46 AM 134 79 mmHg 51 bpm 06/22/22 5:50 PM 133 71 mmHg 49 bpm 06/23/22 9:47 AM 138 70 mmHg 48 bpm 06/23/22 6:51 PM 127 66 mmHg 52 bpm 06/24/22 11:30 AM 128 87 mmHg 49 bpm 06/24/22 6:20 PM 124 69 mmHg 57 bpm 06/25/22 11:10 AM 153 90 mmHg 45 bpm 06/25/22 5:58 PM 104 56 mmHg 48 bpm 06/26/22 7:21 AM 122 63 mmHg 54 bpm 06/26/22 7:12 PM 133 76 mmHg 50 bpm 06/27/22 11:32 AM 154 101 mmHg 56 bpm 06/27/22 11:37 AM 136 74 mmHg 57 bpm 06/27/22 6:10 PM 115 64 mmHg 55 bpm 06/28/22 12:18 PM 147 90 mmHg 54 bpm 06/28/22 12:26 PM 145 89 mmHg 52 bpm 06/28/22 1:43 PM 133 81 mmHg 47 bpm 06/28/22 8:31 PM 131 67 mmHg 55 bpm 06/29/22 7:41 AM 130 67 mmHg 53 bpm 06/29/22 7:37 PM 127 75 mmHg 53 bpm 06/30/22 1:38 PM 131 71 mmHg 63 bpm 06/30/22 9:09 PM 135 78 mmHg 59 bpm 07/01/22 6:03 AM 116 66 mmHg 49 bpm 07/01/22 8:23 PM 121 62 mmHg 55 bpm 07/02/22 9:10 AM 138 83 mmHg 49 bpm 07/02/22 8:50 PM 136 78 mmHg 50 bpm 07/03/22 9:40 AM 149 86 mmHg 51 bpm 07/03/22 9:07 PM 153 82 mmHg 52 bpm 07/04/22 7:05 AM 126  66 mmHg 54 bpm 07/04/22 10:24 AM 156 84 mmHg 73 bpm 07/04/22 6:30 PM 131 74 mmHg 58 bpm 07/05/22 11:03 AM 141 78 mmHg 58 bpm 07/05/22 5:43 PM 117 67 mmHg 50 bpm 07/06/22 8:51 AM 160 97 mmHg 56 bpm 07/06/22 9:00 AM 132 81 mmHg 58 bpm 07/06/22 6:22 PM 124 65 mmHg 50 bpm 07/07/22 10:46 AM 142 79 mmHg 52 bpm 07/07/22 5:18  PM 131 77 mmHg 47 bpm 07/08/22 10:51 AM 159 92 mmHg 60 bpm 07/08/22 2:24 PM 143 86 mmHg 49 bpm 07/08/22 8:40 PM 147 78 mmHg 68 bpm 07/09/22 9:04 AM 133 67 mmHg 50 bpm 07/09/22 7:45 PM 150 80 mmHg 53 bpm 07/10/22 10:05 AM 131 73 mmHg 52 bpm 07/10/22 5:33 PM 110 75 mmHg 52 bpm 07/11/22 11:39 AM 141 78 mmHg 52 bpm 07/12/22 6:36 AM 158 77 mmHg 49 bpm 07/12/22 8:59 PM 127 63 mmHg 61 bpm 07/13/22 6:36 AM 141 83 mmHg 56 bpm 07/13/22 6:59 PM 133 72 mmHg 56 bpm 07/14/22 11:53 AM 130 74 mmHg 63 bpm 07/14/22 6:19 PM 120 69 mmHg 53 bpm 07/15/22 12:12 PM 150 82 mmHg 53 bpm 07/15/22 7:24 PM 112 63 mmHg 54 bpm 07/16/22 10:14 AM 155 87 mmHg 53 bpm 07/16/22 5:46 PM 106 55 mmHg 57 bpm 07/17/22 10:42 AM 150 94 mmHg 58 bpm 07/17/22 9:59 PM 128 80 mmHg 66 bpm 07/18/22 9:44 AM 150 78 mmHg 47 bpm 07/18/22 7:25 PM 160 84 mmHg 48 bpm 07/19/22 8:28 AM 152 90 mmHg 63 bpm 07/19/22 10:31 AM 148 75 mmHg 49 bpm 07/19/22 5:26 PM 152 86 mmHg 59 bpm 07/20/22 11:02 AM 105 64 mmHg 68 bpm 07/20/22 6:00 PM 113 66 mmHg 61 bpm 07/21/22 12:54 PM 129 81 mmHg 58 bpm 07/22/22 7:43 AM 136 80 mmHg 54 bpm 07/22/22 9:12 PM 130 81 mmHg 60 bpm 07/23/22 8:48 AM 142 89 mmHg 70 bpm

## 2022-08-05 ENCOUNTER — Encounter (HOSPITAL_BASED_OUTPATIENT_CLINIC_OR_DEPARTMENT_OTHER): Payer: Self-pay | Admitting: Surgery

## 2022-08-05 NOTE — Progress Notes (Addendum)
Spoke w/ via phone for pre-op interview---  pt Lab needs dos---- [employer] results------ current EKG in epic/ chart COVID test -----patient states asymptomatic no test needed Arrive at ------- 1000 on 08-13-2022 NPO after MN NO Solid Food.  Clear liquids from MN until--- 0900 Med rec completed Medications to take morning of surgery ----- lexapro, lipitor, norvasc, allopurinol, colchicine Diabetic medication ----- do not take metformin and do not do lantus insulin morning of surgery Patient instructed no nail polish to be worn day of surgery Patient instructed to bring photo id and insurance card day of surgery Patient aware to have Driver (ride ) / caregiver    for 24 hours after surgery -- wife, brenda Patient Special Instructions -----  had pt to read back instructions he wrote down to verify he had them correct, corrections made, and pt verbalized understanding of all instructions Pre-Op special Instructions -----  pt has cardiac medical and eliquis clearance from Dr Jacinto Halim in form of letter , printed from epic, placed w/ chart.  Pt stated he did not remember if anyone gave him instructions.  This was also part of the instructions had pt write down and verbalized back , last dose 08-10-2022 evening dose. Patient verbalized understanding of instructions that were given at this phone interview. Patient denies shortness of breath, chest pain, fever, cough at this phone interview.   Anesthesia Review:  HTN;  PAF;  CAD w/ NSTEMI 09/ 2017 s/p PCI w/ DES to LAD x2;  PAD w/ hx intervention in [place] pt does not remember when or what was done but w/ cath 09/ 2017 noted right renal artery stent;  remote hx TIA pt unsure when;  pt has short-term memory loss, smokes "weed" that helps;  chronic arthritic gout;  DM2  PCP:  Dr Sherryll Burger Cardiologist : Dr Jacinto Halim (lov 04-15-2022) Chest x-ray : 04-16-2021 EKG : 04-15-2022 Echo : 04-29-2021 Stress test: no Cardiac Cath :  12-18-2015 Activity level:   pt denies sob w/ normal activity but sob w/ stairs  and long distance Sleep Study/ CPAP : no Fasting Blood Sugar :  98-158    / Checks Blood Sugar -- times a day:  BID Blood Thinner/ Instructions /Last Dose: Eliquis ASA / Instructions/ Last Dose : no

## 2022-08-06 ENCOUNTER — Encounter (HOSPITAL_BASED_OUTPATIENT_CLINIC_OR_DEPARTMENT_OTHER): Payer: Self-pay | Admitting: Surgery

## 2022-08-06 ENCOUNTER — Other Ambulatory Visit: Payer: Self-pay

## 2022-08-06 MED ORDER — METOPROLOL SUCCINATE ER 50 MG PO TB24: 50.0000 mg | ORAL_TABLET | Freq: Every day | ORAL | 3 refills | Status: DC

## 2022-08-13 ENCOUNTER — Ambulatory Visit (HOSPITAL_BASED_OUTPATIENT_CLINIC_OR_DEPARTMENT_OTHER): Payer: 59 | Admitting: Anesthesiology

## 2022-08-13 ENCOUNTER — Encounter (HOSPITAL_BASED_OUTPATIENT_CLINIC_OR_DEPARTMENT_OTHER)
Admission: RE | Disposition: A | Discharge status: Home / Self Care | Payer: Self-pay | Source: Home / Self Care | Attending: Surgery

## 2022-08-13 ENCOUNTER — Ambulatory Visit (HOSPITAL_BASED_OUTPATIENT_CLINIC_OR_DEPARTMENT_OTHER)
Admission: RE | Admit: 2022-08-13 | Discharge: 2022-08-13 | Disposition: A | Discharge status: Home / Self Care | Payer: 59 | Attending: Surgery | Admitting: Surgery

## 2022-08-13 ENCOUNTER — Encounter (HOSPITAL_BASED_OUTPATIENT_CLINIC_OR_DEPARTMENT_OTHER): Payer: Self-pay | Admitting: Surgery

## 2022-08-13 DIAGNOSIS — Z09 Encounter for follow-up examination after completed treatment for conditions other than malignant neoplasm: Secondary | ICD-10-CM | POA: Diagnosis not present

## 2022-08-13 DIAGNOSIS — I2510 Atherosclerotic heart disease of native coronary artery without angina pectoris: Secondary | ICD-10-CM | POA: Insufficient documentation

## 2022-08-13 DIAGNOSIS — Z87891 Personal history of nicotine dependence: Secondary | ICD-10-CM | POA: Insufficient documentation

## 2022-08-13 DIAGNOSIS — Z7984 Long term (current) use of oral hypoglycemic drugs: Secondary | ICD-10-CM | POA: Diagnosis not present

## 2022-08-13 DIAGNOSIS — I10 Essential (primary) hypertension: Secondary | ICD-10-CM | POA: Diagnosis not present

## 2022-08-13 DIAGNOSIS — Z7901 Long term (current) use of anticoagulants: Secondary | ICD-10-CM | POA: Insufficient documentation

## 2022-08-13 DIAGNOSIS — E1151 Type 2 diabetes mellitus with diabetic peripheral angiopathy without gangrene: Secondary | ICD-10-CM | POA: Diagnosis not present

## 2022-08-13 DIAGNOSIS — F32A Depression, unspecified: Secondary | ICD-10-CM | POA: Diagnosis not present

## 2022-08-13 DIAGNOSIS — F419 Anxiety disorder, unspecified: Secondary | ICD-10-CM | POA: Insufficient documentation

## 2022-08-13 DIAGNOSIS — Z01818 Encounter for other preprocedural examination: Secondary | ICD-10-CM

## 2022-08-13 DIAGNOSIS — K4090 Unilateral inguinal hernia, without obstruction or gangrene, not specified as recurrent: Secondary | ICD-10-CM | POA: Diagnosis present

## 2022-08-13 DIAGNOSIS — Z539 Procedure and treatment not carried out, unspecified reason: Secondary | ICD-10-CM | POA: Insufficient documentation

## 2022-08-13 HISTORY — DX: Occlusion and stenosis of bilateral carotid arteries: I65.23

## 2022-08-13 HISTORY — DX: Presence of coronary angioplasty implant and graft: Z95.5

## 2022-08-13 HISTORY — DX: Family history of ischemic heart disease and other diseases of the circulatory system: Z82.49

## 2022-08-13 HISTORY — DX: Long term (current) use of anticoagulants: Z79.01

## 2022-08-13 HISTORY — DX: Emphysema, unspecified: J43.9

## 2022-08-13 HISTORY — DX: Personal history of transient ischemic attack (TIA), and cerebral infarction without residual deficits: Z86.73

## 2022-08-13 HISTORY — DX: Chronic gout, unspecified, without tophus (tophi): M1A.9XX0

## 2022-08-13 HISTORY — DX: Other psychoactive substance use, unspecified, in remission: F19.91

## 2022-08-13 HISTORY — DX: Complete loss of teeth, unspecified cause, unspecified class: K08.109

## 2022-08-13 HISTORY — DX: Other amnesia: R41.3

## 2022-08-13 HISTORY — DX: Complete loss of teeth, unspecified cause, unspecified class: Z97.2

## 2022-08-13 HISTORY — DX: Other forms of dyspnea: R06.09

## 2022-08-13 HISTORY — DX: Long term (current) use of insulin: Z79.4

## 2022-08-13 HISTORY — DX: Idiopathic chronic gout, unspecified site, without tophus (tophi): M1A.00X0

## 2022-08-13 HISTORY — DX: Paroxysmal atrial fibrillation: I48.0

## 2022-08-13 HISTORY — DX: Type 2 diabetes mellitus without complications: E11.9

## 2022-08-13 SURGERY — REPAIR, HERNIA, INGUINAL, LAPAROSCOPIC
Anesthesia: General | Laterality: Right

## 2022-08-13 MED ORDER — KETOROLAC TROMETHAMINE 15 MG/ML IJ SOLN: 15.0000 mg | INTRAMUSCULAR | Status: DC

## 2022-08-13 MED ORDER — BUPIVACAINE LIPOSOME 1.3 % IJ SUSP: 20.0000 mL | Freq: Once | INTRAMUSCULAR | Status: DC

## 2022-08-13 MED ORDER — ENOXAPARIN SODIUM 40 MG/0.4ML IJ SOSY
40.0000 mg | PREFILLED_SYRINGE | Freq: Once | INTRAMUSCULAR | Status: AC
  Administered 2022-08-13: 40 mg via SUBCUTANEOUS

## 2022-08-13 MED ORDER — GABAPENTIN 300 MG PO CAPS
ORAL_CAPSULE | ORAL | Status: AC
  Filled 2022-08-13: qty 1

## 2022-08-13 MED ORDER — ACETAMINOPHEN 500 MG PO TABS
1000.0000 mg | ORAL_TABLET | ORAL | Status: AC
  Administered 2022-08-13: 1000 mg via ORAL

## 2022-08-13 MED ORDER — GABAPENTIN 300 MG PO CAPS
300.0000 mg | ORAL_CAPSULE | ORAL | Status: AC
  Administered 2022-08-13: 300 mg via ORAL

## 2022-08-13 MED ORDER — ENOXAPARIN SODIUM 40 MG/0.4ML IJ SOSY
PREFILLED_SYRINGE | INTRAMUSCULAR | Status: AC
  Filled 2022-08-13: qty 0.4

## 2022-08-13 MED ORDER — CHLORHEXIDINE GLUCONATE CLOTH 2 % EX PADS: 6.0000 | MEDICATED_PAD | Freq: Once | CUTANEOUS | Status: DC

## 2022-08-13 MED ORDER — CEFAZOLIN SODIUM-DEXTROSE 2-4 GM/100ML-% IV SOLN: 2.0000 g | INTRAVENOUS | Status: DC

## 2022-08-13 MED ORDER — MIDAZOLAM HCL 2 MG/2ML IJ SOLN
INTRAMUSCULAR | Status: AC
  Filled 2022-08-13: qty 2

## 2022-08-13 MED ORDER — PROPOFOL 10 MG/ML IV BOLUS
INTRAVENOUS | Status: AC
  Filled 2022-08-13: qty 20

## 2022-08-13 MED ORDER — ACETAMINOPHEN 500 MG PO TABS
ORAL_TABLET | ORAL | Status: AC
  Filled 2022-08-13: qty 2

## 2022-08-13 MED ORDER — LIDOCAINE HCL (PF) 2 % IJ SOLN
INTRAMUSCULAR | Status: AC
  Filled 2022-08-13: qty 5

## 2022-08-13 MED ORDER — LACTATED RINGERS IV SOLN: INTRAVENOUS | Status: DC

## 2022-08-13 MED ORDER — ROCURONIUM BROMIDE 10 MG/ML (PF) SYRINGE
PREFILLED_SYRINGE | INTRAVENOUS | Status: AC
  Filled 2022-08-13: qty 10

## 2022-08-13 MED ORDER — FENTANYL CITRATE (PF) 100 MCG/2ML IJ SOLN
INTRAMUSCULAR | Status: AC
  Filled 2022-08-13: qty 2

## 2022-08-13 MED ORDER — CEFAZOLIN SODIUM-DEXTROSE 2-4 GM/100ML-% IV SOLN
INTRAVENOUS | Status: AC
  Filled 2022-08-13: qty 100

## 2022-08-13 SURGICAL SUPPLY — 13 items
BLADE CLIPPER SENSICLIP SURGIC (BLADE) IMPLANT
MESH 3DMAX 4X6 LT LRG (Mesh General) IMPLANT
MESH 3DMAX 4X6 RT LRG (Mesh General) IMPLANT
MESH 3DMAX 5X7 LT XLRG (Mesh General) IMPLANT
MESH 3DMAX 5X7 RT XLRG (Mesh General) IMPLANT
RELOAD STAPLE HERNIA 4.8 BLK (STAPLE) IMPLANT
SPIKE FLUID TRANSFER (MISCELLANEOUS) IMPLANT
SPONGE T-LAP 18X18 ~~LOC~~+RFID (SPONGE) IMPLANT
STAPLER HERNIA 12 8.5 360D (INSTRUMENTS) IMPLANT
SUT VICRYL 0 UR6 27IN ABS (SUTURE) IMPLANT
SUT VLOC 180 2-0 9IN GS21 (SUTURE) IMPLANT
TRAY FOL W/BAG SLVR 16FR STRL (SET/KITS/TRAYS/PACK) IMPLANT
TRAY FOLEY W/BAG SLVR 16FR LF (SET/KITS/TRAYS/PACK)

## 2022-08-13 NOTE — Progress Notes (Signed)
Pt did not hold Eliquis for 2 days. Dr. Dossie Der aware. Needs to be rescheduled

## 2022-08-13 NOTE — Anesthesia Preprocedure Evaluation (Signed)
Anesthesia Evaluation  Patient identified by MRN, date of birth, ID band Patient awake    Reviewed: Allergy & Precautions, H&P , NPO status , Patient's Chart, lab work & pertinent test results  Airway Mallampati: II   Neck ROM: full    Dental   Pulmonary COPD, Patient abstained from smoking., former smoker   breath sounds clear to auscultation       Cardiovascular hypertension, + CAD, + Past MI, + Cardiac Stents, + Peripheral Vascular Disease and + DOE   Rhythm:regular Rate:Normal  TTE (04/29/21): EF 55-60%, normal valves.     Neuro/Psych  PSYCHIATRIC DISORDERS Anxiety Depression       GI/Hepatic   Endo/Other  diabetes, Type 2    Renal/GU      Musculoskeletal  (+) Arthritis ,    Abdominal   Peds  Hematology   Anesthesia Other Findings   Reproductive/Obstetrics                             Anesthesia Physical Anesthesia Plan  ASA: 3  Anesthesia Plan: General   Post-op Pain Management:    Induction: Intravenous  PONV Risk Score and Plan: 2 and Ondansetron, Dexamethasone, Midazolam and Treatment may vary due to age or medical condition  Airway Management Planned: Oral ETT  Additional Equipment:   Intra-op Plan:   Post-operative Plan: Extubation in OR  Informed Consent: I have reviewed the patients History and Physical, chart, labs and discussed the procedure including the risks, benefits and alternatives for the proposed anesthesia with the patient or authorized representative who has indicated his/her understanding and acceptance.     Dental advisory given  Plan Discussed with: CRNA, Anesthesiologist and Surgeon  Anesthesia Plan Comments:        Anesthesia Quick Evaluation

## 2022-08-13 NOTE — H&P (Signed)
Admitting Physician: [name]  Service: General surgery  CC: inguinal hernia  Subjective   HPI: [name] is an 67 y.o. male who is here for inguinal hernia repair  Past Medical History:  Diagnosis Date   Anxiety    Arthritis    Bilateral carotid artery stenosis    s/p  bilateral CEA  2021 and 2022   Chronic anticoagulation    eliquis--- managed by dr Jacinto Halim   Chronic gouty arthritis    per pt bilateral knees and great toes   Coronary artery disease 2007   cardiologsit--- dr Jacinto Halim;   NSTEMI 12-18-2015  s/p cath w/ PCI balloon angioplasty to dPDA/  DES overlap to prox and mid LAD   Depression    DOE (dyspnea on exertion)    08-05-2022  per pt sob w/ stairs   Emphysema lung (HCC)    Family history of premature CAD    Full dentures    History of bowel resection 04/19/2021   admission in epic ;  recent admission for DKA, 04-16-2021 readmittd 04-17-2021 abdominal pain , dx embolic mesenteric ischemic bowel  s/p ileocecectomy   History of diabetic ketoacidosis 04/16/2021   admission in epic , dx DM2   History of illicit drug use    History of non-ST elevation myocardial infarction (NSTEMI) 12/18/2015   History of transient ischemic attack (TIA)    08-05-2022  per pt remote hx, unsure when;   per imaging in epic/ care everywhere MRI  11-20-2020  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 (pons, basal ganglia, periventrcular white matter of cerebellar)   Hyperlipidemia    Hypertension    PAD (peripheral artery disease) (HCC)    s/p  vascular intervention in [place] , pt unsure what was done but w/ cardiac cath 12-18-2015 aortagram w/ lower extremities was done showed right renal artery stent   PAF (paroxysmal atrial fibrillation) (HCC)    followed by [facility];   first started post op carotid surgery,  takes eliquis   S/P drug eluting coronary stent placement 12/18/2015   to prox and mid LAD,  balloon angioplasty to distal PDA   Short-term memory loss     08-05-2022  pt stated smoking majiuana helps with his memory   Type 2 diabetes mellitus treated with insulin (HCC)    followed by pcp   (08-05-2022  per pt checks blood sugar twice dialy,  fasting average 98-158)    Past Surgical History:  Procedure Laterality Date   CARDIAC CATHETERIZATION N/A 12/18/2015   Procedure: Left Heart Cath and Coronary Angiography;  Surgeon: Yates Decamp, MD;  Location: [facility] INVASIVE CV LAB;  Service: Cardiovascular;  Laterality: N/A;   CARDIAC CATHETERIZATION N/A 12/18/2015   Procedure: Coronary Stent Intervention;  Surgeon: Yates Decamp, MD;  Location: [facility] INVASIVE CV LAB;  Service: Cardiovascular;  Laterality: N/A;   CARDIAC CATHETERIZATION N/A 12/18/2015   Procedure: Coronary Balloon Angioplasty;  Surgeon: Yates Decamp, MD;  Location: [facility] INVASIVE CV LAB;  Service: Cardiovascular;  Laterality: N/A;   CARDIAC CATHETERIZATION  01/17/2006   @[facility]  by dr Gala Romney;   for chest pain/ palpitations/ +cocaine;    mild nonobstructive CAD   CARDIAC CATHETERIZATION  05/03/2008   @[facility]  by dr Gala Romney;  for severe CP/ severely elevated BP;  nonobstructive CAD w/ normal LVF,  ? abuse withdrawal   COLONOSCOPY N/A 01/12/2016   Procedure: COLONOSCOPY;  Surgeon: Ruffin Frederick, MD;  Location: [facility] ENDOSCOPY;  Service: Gastroenterology;  Laterality: N/A;   ENDARTERECTOMY Left 03/04/2020  Procedure: LEFT ENDARTERECTOMY CAROTID;  Surgeon: Larina Earthly, MD;  Location: [facility] OR;  Service: Vascular;  Laterality: Left;   ENDARTERECTOMY Right 04/22/2020   Procedure: RIGHT CAROTID ENDARTERECTOMY;  Surgeon: Larina Earthly, MD;  Location: [facility] OR;  Service: Vascular;  Laterality: Right;   LAPAROSCOPY N/A 04/19/2021   Procedure: LAPAROSCOPY DIAGNOSTIC;  Surgeon: Quentin Ore, MD;  Location: [facility] OR;  Service: General;  Laterality: N/A;   LAPAROTOMY N/A 04/19/2021   Procedure: EXPLORATORY LAPAROTOMY, ILEOCECECTOMY;  Surgeon: Quentin Ore, MD;  Location: [facility] OR;  Service: General;   Laterality: N/A;   PATCH ANGIOPLASTY Left 03/04/2020   Procedure: PATCH ANGIOPLASTY;  Surgeon: Larina Earthly, MD;  Location: [facility] OR;  Service: Vascular;  Laterality: Left;   PERIPHERAL VASCULAR CATHETERIZATION N/A 12/18/2015   Procedure: Abdominal Aortogram;  Surgeon: Yates Decamp, MD;  Location: [facility] INVASIVE CV LAB;  Service: Cardiovascular;  Laterality: N/A;   PERIPHERAL VASCULAR INTERVENTION     in [place]   (per pt unsure what was done or when)   UMBILICAL HERNIA REPAIR     age 74s    Family History  Problem Relation Age of Onset   Heart attack Mother    Heart attack Father    IgA nephropathy Son     Social:  reports that he quit smoking about 3 months ago. His smoking use included cigarettes. He has a 23.00 pack-year smoking history. He has never used smokeless tobacco. He reports that he does not currently use alcohol. He reports current drug use. Drug: Marijuana.  Allergies:  Allergies  Allergen Reactions   Codeine Nausea And Vomiting    Medications: Current Outpatient Medications  Medication Instructions   allopurinol (ZYLOPRIM) 100 mg, Oral, Daily   amLODipine (NORVASC) 10 mg, Oral, Daily   atorvastatin (LIPITOR) 80 MG tablet TAKE 1 TABLET BY MOUTH DAILY   blood glucose meter kit and supplies Dispense based on patient and insurance preference. Use up to four times daily as directed. (FOR ICD-10 E10.9, E11.9).   busPIRone (BUSPAR) 10 mg, Oral, 2 times daily PRN   colchicine 0.6 mg, Oral, Daily   ELIQUIS 5 MG TABS tablet TAKE 1 TABLET(5 MG) BY MOUTH TWICE DAILY   escitalopram (LEXAPRO) 20 mg, Oral, Daily   hydrOXYzine (ATARAX) 25 mg, Oral, At bedtime PRN   insulin glargine (LANTUS) 22 Units, Subcutaneous, Daily   Insulin Pen Needle 32G X 4 MM MISC 1 each, Does not apply, As needed   losartan-hydrochlorothiazide (HYZAAR) 100-12.5 MG tablet 1 tablet, Oral, Daily   metFORMIN (GLUCOPHAGE) 500 mg, Oral, 2 times daily with meals   metoprolol succinate (TOPROL-XL) 50 mg, Oral,  Daily, Take with or immediately following a meal.   nitroGLYCERIN (NITROSTAT) 0.4 mg, Sublingual, Every 5 min PRN, For chest pain    VASCEPA 1 g capsule TAKE 2 CAPSULES(2 GRAMS) BY MOUTH TWICE DAILY    ROS - all of the below systems have been reviewed with the patient and positives are indicated with bold text General: chills, fever or night sweats Eyes: blurry vision or double vision ENT: epistaxis or sore throat Allergy/Immunology: itchy/watery eyes or nasal congestion Hematologic/Lymphatic: bleeding problems, blood clots or swollen lymph nodes Endocrine: temperature intolerance or unexpected weight changes Breast: new or changing breast lumps or nipple discharge Resp: cough, shortness of breath, or wheezing CV: chest pain or dyspnea on exertion GI: as per HPI GU: dysuria, trouble voiding, or hematuria MSK: joint pain or joint stiffness Neuro: TIA or stroke symptoms Derm: pruritus and  skin lesion changes Psych: anxiety and depression  Objective   PE There were no vitals taken for this visit. Constitutional: NAD; conversant; no deformities Eyes: Moist conjunctiva; no lid lag; anicteric; PERRL Neck: Trachea midline; no thyromegaly Lungs: Normal respiratory effort; no tactile fremitus CV: RRR; no palpable thrills; no pitting edema GI: Abd Soft, nontender, right inguinal hernia; no palpable hepatosplenomegaly MSK: Normal range of motion of extremities; no clubbing/cyanosis Psychiatric: Appropriate affect; alert and oriented x3 Lymphatic: No palpable cervical or axillary lymphadenopathy  No results found for this or any previous visit (from the past 24 hour(s)).  Imaging Orders  No imaging studies ordered today     Assessment and Plan   [name] has a right inguinal hernia.  I recommended laparoscopic, possibly open, right inguinal hernia repair with mesh. The procedure itself as well as its risk, benefits, and alternatives were discussed with the patient. After full  discussion all questions answered the patient granted consent to proceed. Our surgery scheduler will reach out to the patient to schedule surgery. We will ask for preoperative cardiac evaluation from his cardiologist.    Quentin Ore, MD  [facility] Use [email or web address] to contact on call provider

## 2022-08-30 DIAGNOSIS — I639 Cerebral infarction, unspecified: Secondary | ICD-10-CM | POA: Insufficient documentation

## 2022-08-30 HISTORY — PX: COLONOSCOPY WITH PROPOFOL: SHX5780

## 2022-09-07 DIAGNOSIS — K648 Other hemorrhoids: Secondary | ICD-10-CM | POA: Insufficient documentation

## 2022-09-20 ENCOUNTER — Encounter (HOSPITAL_BASED_OUTPATIENT_CLINIC_OR_DEPARTMENT_OTHER): Payer: Self-pay | Admitting: Surgery

## 2022-09-29 ENCOUNTER — Encounter (HOSPITAL_BASED_OUTPATIENT_CLINIC_OR_DEPARTMENT_OTHER): Payer: Self-pay | Admitting: Surgery

## 2022-09-29 NOTE — Progress Notes (Addendum)
Spoke w/ via phone for pre-op interview--- pt Lab needs dos----   [employer] Rx results------ current EKG in epic/ chart COVID test -----patient states asymptomatic no test needed Arrive at ------- 1245 on 10-05-2022 NPO after MN NO Solid Food.  Clear liquids from MN until--- 1145 Med rec completed Medications to take morning of surgery ----- toprol, lexapro, lipitor, norvasc, allopurinol, colchicone Diabetic medication ----- do not take metformin and do not do lantus insulin morning of surgery Patient instructed no nail polish to be worn day of surgery Patient instructed to bring photo id and insurance card day of surgery Patient aware to have Driver (ride ) / caregiver    for 24 hours after surgery -- wife, [name]  Patient Special Instructions -----  pt was rescheduled from 08-13-2022 on dos in pre-op due to he had not stopped his eliquis. Previously had slowly go over instructions with pt and spell each medication out (genertic & brand name) and have pt read back what he had written, corrected , and re-read what he wrote. Pt stated he still had his written instructions from before he was able to read out what medication's to take morning of surgery and what medication not take take morning of surgery, which were correct.  Then had pt write down last dose for eliquis this Saturday evening 10-02-2022, had him verbally read back to me, verbalized understanding.  Pt stated it is easier for him when he sees his medication than have them just called out, he is bringing his written list with him .  Pre-Op special Instructions ----- pt has cardiac medical and eliquis clearance from Dr Jacinto Halim in form of letter , printed from epic, placed w/ chart. (Able to use since for same surgery) Sent inbox message to dr stechschulte in epic , requested orders.  Patient verbalized understanding of instructions that were given at this phone interview. Patient denies shortness of breath, chest pain, fever,  cough at this phone interview.   Anesthesia Review: HTN; PAF;  CAD w/ NSTEMI 09/ 2017 s/p PCI w/ DES to LAD x2; PAD w/ hx intervention in [place] pt does not remember when or what done but w/ cath 09/ 2017 noted right renal artery stent;  remote hx TIA pt unsure when; pt has short-term memory loss, smokes "week" that helps; chronic arthritic gout:  DM2  PCP:  Dr Sherryll Burger Cardiologist :  Dr Jacinto Halim (lov 04-15-2022) Chest x-ray : 04-16-2022 EKG : 04-15-2022 Echo : 04-29-2021 Stress test: no Cardiac Cath : 12-18-2015 Activity level: pt denies sob w/ normal activity but sob w/ stairs and long distance Sleep Study/ CPAP : no Fasting Blood Sugar :   98-158   / Checks Blood Sugar -- times a day:  BID Blood Thinner/ Instructions /Last Dose: Eliquis ASA / Instructions/ Last Dose : no

## 2022-09-30 ENCOUNTER — Ambulatory Visit: Payer: Self-pay | Admitting: Surgery

## 2022-10-04 NOTE — Progress Notes (Addendum)
Left voicemail message for patient to return call to review pre op instructions for 10-05-2022 surgery  Pt called and stated wife is sick in hospital and he is cancelling his surgery for 10-05-2022, left message with tammy at [facility], pt is cancelling his 10-05-2022 surgery.

## 2022-10-05 ENCOUNTER — Ambulatory Visit (HOSPITAL_BASED_OUTPATIENT_CLINIC_OR_DEPARTMENT_OTHER): Admission: RE | Admit: 2022-10-05 | Payer: 59 | Source: Home / Self Care | Admitting: Surgery

## 2022-10-05 DIAGNOSIS — Z01818 Encounter for other preprocedural examination: Secondary | ICD-10-CM

## 2022-10-05 SURGERY — REPAIR, HERNIA, INGUINAL, LAPAROSCOPIC
Anesthesia: General | Laterality: Right

## 2022-10-14 ENCOUNTER — Ambulatory Visit: Payer: 59 | Admitting: Cardiology

## 2022-10-14 ENCOUNTER — Encounter: Payer: Self-pay | Admitting: Cardiology

## 2022-10-14 VITALS — BP 127/66 | HR 50 | Resp 16 | Ht 66.0 in | Wt 178.6 lb

## 2022-10-14 DIAGNOSIS — I2510 Atherosclerotic heart disease of native coronary artery without angina pectoris: Secondary | ICD-10-CM

## 2022-10-14 DIAGNOSIS — I480 Paroxysmal atrial fibrillation: Secondary | ICD-10-CM

## 2022-10-14 DIAGNOSIS — I10 Essential (primary) hypertension: Secondary | ICD-10-CM

## 2022-10-14 DIAGNOSIS — E782 Mixed hyperlipidemia: Secondary | ICD-10-CM

## 2022-10-14 MED ORDER — METOPROLOL SUCCINATE ER 100 MG PO TB24
100.0000 mg | ORAL_TABLET | Freq: Every day | ORAL | 3 refills | Status: DC
Start: 2022-10-14 — End: 2023-09-23

## 2022-10-14 NOTE — Progress Notes (Signed)
Primary Physician/Referring:  Ellyn Hack, MD  Patient ID: [name], male    DOB: 07-30-1955, 67 y.o.   MRN: [id number]  Chief Complaint  Patient presents with   Atherosclerosis of native coronary artery of native heart w   PAD   Follow-up    6 months   HPI:    [name]  is a 68 y.o. 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 LAD in 2017, peripheral artery disease with interventions to his lower extremity in the remote past in [place], primary hypertension, mixed hyperlipidemia, history of substance abuse remotely, tobacco use disorder quit 05/02/2022, hypertension, history of TIA and excessive alcohol use quit in July 2021, bilateral carotid endarterectomy left in December 21 and right in January 2022, family history of premature coronary artery disease in which his father died at the age of 53 and his mother at age 72 from MI with CABG in her 24s.  Patient also has history of postop A-fib RVR after carotid endarterectomy and presently on chronic Eliquis.    This is 27-month office visit, patient states that he is doing well and he has not had any recurrence of angina pectoris.  He is tolerating all his medications well.  He has not had any bleeding complications from Eliquis.  Denies symptoms of claudication or recurrence of TIA.  Past Medical History:  Diagnosis Date   Anxiety    Arthritis    Bilateral carotid artery stenosis    s/p  bilateral CEA  2021 and 2022   Chronic anticoagulation    eliquis--- managed by dr Jacinto Halim   Chronic gouty arthritis    per pt bilateral knees and great toes   Coronary artery disease 2007   cardiologsit--- dr Jacinto Halim;   NSTEMI 12-18-2015  s/p cath w/ PCI balloon angioplasty to dPDA/  DES overlap to prox and mid LAD   Depression    DOE (dyspnea on exertion)    08-05-2022  per pt sob w/ stairs   Emphysema lung (HCC)    Family history of premature CAD    Full dentures    History of bowel resection 04/19/2021    admission in epic ;  recent admission for DKA, 04-16-2021 readmittd 04-17-2021 abdominal pain , dx embolic mesenteric ischemic bowel  s/p ileocecectomy   History of diabetic ketoacidosis 04/16/2021   admission in epic , dx DM2   History of illicit drug use    History of non-ST elevation myocardial infarction (NSTEMI) 12/18/2015   History of transient ischemic attack (TIA)    08-05-2022  per pt remote hx, unsure when;   per imaging in epic/ care everywhere MRI  11-20-2020  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 (pons, basal ganglia, periventrcular white matter of cerebellar)   Hyperlipidemia    Hypertension    Ischemic segment of 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-s/p exploratory laparotomy on 1/22 04/29/2021   PAD (peripheral artery disease) (HCC)    s/p  vascular intervention in [place] , pt unsure what was done but w/ cardiac cath 12-18-2015 aortagram w/ lower extremities was done showed right renal artery stent   PAF (paroxysmal atrial fibrillation) (HCC)    followed by Makayah Pauli;   first started post op carotid surgery,  takes eliquis   S/P drug eluting coronary stent placement 12/18/2015   to prox and mid LAD,  balloon angioplasty to distal PDA   Short-term memory loss    08-05-2022  pt  stated smoking majiuana helps with his memory   Type 2 diabetes mellitus treated with insulin (HCC)    followed by pcp   (08-05-2022  per pt checks blood sugar twice dialy,  fasting average 98-158)    Family History  Problem Relation Age of Onset   Heart attack Mother    Heart attack Father    IgA nephropathy Son     Social History   Tobacco Use   Smoking status: Former    Current packs/day: 0.00    Average packs/day: 1 pack/day for 23.0 years (23.0 ttl pk-yrs)    Types: Cigarettes    Start date: 05/03/1999    Quit date: 05/02/2022    Years since quitting: 0.4   Smokeless tobacco: Never  Substance Use Topics   Alcohol use: Not Currently     Comment: quit drinking   Marital Status: Legally Separated  ROS  Review of Systems  Cardiovascular:  Positive for dyspnea on exertion. Negative for chest pain and leg swelling.   Objective  Blood pressure 127/66, pulse (!) 50, resp. rate 16, height 5\' 6"  (1.676 m), weight 178 lb 9.6 oz (81 kg), SpO2 96%.     10/14/2022   12:12 PM 09/29/2022    5:40 PM 08/13/2022   10:49 AM  Vitals with BMI  Height 5\' 6"  5\' 6"    Weight 178 lbs 10 oz 167 lbs 2 oz 167 lbs  BMI 28.84 26.98 26.97  Systolic 127    Diastolic 66    Pulse 50       Physical Exam Neck:     Vascular: Carotid bruit (bilateral) present. No JVD.  Cardiovascular:     Rate and Rhythm: Normal rate and regular rhythm.     Pulses: Intact distal pulses.          Dorsalis pedis pulses are 1+ on the right side and 1+ on the left side.       Posterior tibial pulses are 1+ on the right side and 1+ on the left side.     Heart sounds: Normal heart sounds. No murmur heard.    No gallop.  Pulmonary:     Effort: Pulmonary effort is normal.     Breath sounds: Normal breath sounds.  Abdominal:     General: Bowel sounds are normal.     Palpations: Abdomen is soft.  Musculoskeletal:     Right lower leg: No edema.     Left lower leg: No edema.    Laboratory examination:   Lab Results  Component Value Date   NA 139 09/16/2021   K 3.8 09/16/2021   CO2 21 09/16/2021   GLUCOSE 88 09/16/2021   BUN 11 09/16/2021   CREATININE 0.96 09/16/2021   CALCIUM 8.9 09/16/2021   EGFR 87 09/16/2021   GFRNONAA >60 04/30/2021       Latest Ref Rng & Units 09/16/2021    2:46 PM 04/30/2021    4:15 AM 04/29/2021    5:00 AM  CMP  Glucose 65 - 99 mg/dL 88  952  841   BUN 7 - 25 mg/dL 11  9  16    Creatinine 0.70 - 1.35 mg/dL 3.24  4.01  0.27   Sodium 135 - 146 mmol/L 139  138  137   Potassium 3.5 - 5.3 mmol/L 3.8  4.3  4.5   Chloride 98 - 110 mmol/L 108  107  107   CO2 20 - 32 mmol/L 21  24  23    Calcium 8.6 -  10.3 mg/dL 8.9  7.9  8.2   Total Protein  6.1 - 8.1 g/dL 6.8  4.4    Total Bilirubin 0.2 - 1.2 mg/dL 0.4  0.4    Alkaline Phos 38 - 126 U/L  72    AST 10 - 35 U/L 18  25    ALT 9 - 46 U/L 14  23        Latest Ref Rng & Units 09/16/2021    2:46 PM 05/03/2021    3:42 AM 05/02/2021    3:55 AM  CBC  WBC 3.8 - 10.8 Thousand/uL 8.6  7.6  7.7   Hemoglobin 13.2 - 17.1 g/dL 47.8  9.3  9.1   Hematocrit 38.5 - 50.0 % 38.6  28.8  28.0   Platelets 140 - 400 Thousand/uL 197  158  151    HEMOGLOBIN A1C Lab Results  Component Value Date   HGBA1C >15.5 (H) 04/19/2021   MPG >398 04/19/2021   External labs:  Lipoprotein (a) <75.0 nmol/L 15.1, apolipoprotein B normal at 44.   Date 07/11/2019  Cholesterol, total 73.000 mg 09/16/2021 HDL 41.000 mg 09/16/2021 LDL 18.000 mg 09/16/2021 Triglycerides 56.000 mg 09/16/2021  A1C 6.200 % 07/11/2019 TSH 1.720 09/16/2021  Hemoglobin 12.800 g/d 09/16/2021 Platelets 197.000 Th 09/16/2021  Creatinine, Serum 0.960 mg/ 09/16/2021 Potassium 3.800 mm 09/16/2021  ALT (SGPT) 14.000 U/L 09/16/2021  Radiology:   CTA head and neck 02/11/2020: 1. Atherosclerotic changes at the bilateral carotid bifurcations. Focal area of bandlike stenosis in the right carotid bifurcation resulting in approximately 90% stenosis. 2. Atherosclerotic changes of the left carotid bifurcation resulting in approximately 70% stenosis. 3. No acute intracranial abnormality. Remote infarcts within the bilateral basal ganglia. Chronic microangiopathy. 4. Emphysema and aortic atherosclerosis.  Cardiac Studies:   Left Heart Catheterization 12/18/2015:  Successful PTCA and stenting of the proximal and mid LAD with 2 overlapping 4.0 x 22 and 3.5 x 12 mm resolute integrity DES, stenosis reduced from 99% to 0%. PTCA and balloon angioplasty of the distal PDA with a 2.5 x 15 mm Emerge with reduction of stenosis from 90% to less than 30%. 1. Severe diffuse coronary artery disease, scattered disease evident throughout the RCA, PDA has tandem 60% and  a 90% stenosis. Distal lesion reduced to less than 30% with balloon angioplasty. 2. High-grade complex 99% stenosis of the proximal LAD. Successful angioplasty as noted above. 3. Mild disease in the circumflex with intermediate lesions 60-70% in OM 3 and OM 4 and distal circumflex AV groove branch. 4. Abdominal aortogram: No evidence of abdominal aortic aneurysm. There are 2 renal arteries on the left, one renal artery on the right. A renal artery stent is evident on the right, renal artery is visible and patent, in-stent restenosis is present could not be evaluated due to nonselective nature of abdominal aortogram. Consider other modalities to evaluate renal artery stenosis, selective renal arteriogram was not performed as patient was not consented and also procedure performed through radial access.   12/18/2015: proximal and mid LAD with 2 overlapping 4.0 x 22 and 3.5 x 12 mm resolute integrity DES  ABI 09/23/2020:  This exam reveals moderately decreased perfusion of the right lower  extremity, noted at the anterior tibial and post tibial artery level (ABI  0.69) and moderately decreased perfusion of the left lower extremity,  noted at the anterior tibial and post tibial artery level (ABI 0.72).  No significant change from 02/25/2016.   Carotid artery duplex 11/19/2021: 1 to 39% right ICA stenosis,  right external carotid artery <50% stenosis. 1 to 39% left ICA stenosis.  Left external carotid artery occluded. Bilateral carotid endarterectomy site widely patent. Antegrade vertebral artery flow.  Echocardiogram 04/29/2021: 1. Left ventricular ejection fraction, by estimation, is 55 to 60%. The left ventricle has normal function. The left ventricle has no regional wall motion abnormalities. Left ventricular diastolic parameters are consistent with Grade I diastolic dysfunction (impaired relaxation).   2. Right ventricular systolic function is normal. The right ventricular size is normal.   3. The  mitral valve is normal in structure. Trivial mitral valve regurgitation. No evidence of mitral stenosis.   4. The aortic valve is normal in structure. Aortic valve regurgitation is not visualized. Aortic valve sclerosis is present, with no evidence of aortic valve stenosis.   5. The inferior vena cava is normal in size with greater than 50% respiratory variability, suggesting right atrial pressure of 3 mmHg.  EKG:   EKG 10/15/2022: Sinus rhythm first-degree AV block at rate of 48 bpm, leftward enlargement, otherwise normal EKG. Compared to 04/15/2022, first-degree AV block is new, previous heart rate was 51 bpm.  EKG 04/04/2020: Atrial fibrillation with controlled ventricular response at the rate of 67 bpm, normal axis, poor R wave progression, cannot exclude anteroseptal infarct old.  No evidence of ischemia.  No significant change from EKG 03/05/2020.  Allergies   Allergies  Allergen Reactions   Codeine Nausea And Vomiting    Current Outpatient Medications:    allopurinol (ZYLOPRIM) 100 MG tablet, Take 100 mg by mouth daily., Disp: , Rfl:    amLODipine (NORVASC) 10 MG tablet, Take 10 mg by mouth daily., Disp: , Rfl:    atorvastatin (LIPITOR) 10 MG tablet, Take 10 mg by mouth at bedtime., Disp: , Rfl:    BELSOMRA 10 MG TABS, Take 1 tablet by mouth at bedtime as needed., Disp: , Rfl:    blood glucose meter kit and supplies, Dispense based on patient and insurance preference. Use up to four times daily as directed. (FOR ICD-10 E10.9, E11.9)., Disp: 1 each, Rfl: 0   colchicine 0.6 MG tablet, Take 0.6 mg by mouth daily., Disp: , Rfl:    ELIQUIS 5 MG TABS tablet, TAKE 1 TABLET(5 MG) BY MOUTH TWICE DAILY (Patient taking differently: Take 5 mg by mouth 2 (two) times daily.), Disp: 60 tablet, Rfl: 11   insulin glargine (LANTUS) 100 UNIT/ML Solostar Pen, Inject 22 Units into the skin daily. (Patient taking differently: Inject 10 Units into the skin daily.), Disp: 15 mL, Rfl: 11   Insulin Pen Needle 32G  X 4 MM MISC, 1 each by Does not apply route as needed., Disp: 200 each, Rfl: 0   losartan-hydrochlorothiazide (HYZAAR) 100-12.5 MG tablet, Take 1 tablet by mouth daily., Disp: , Rfl:    metFORMIN (GLUCOPHAGE) 500 MG tablet, Take 1 tablet (500 mg total) by mouth 2 (two) times daily with a meal. (Patient taking differently: Take 250 mg by mouth 2 (two) times daily with a meal.), Disp: 60 tablet, Rfl: 11   Multiple Vitamins-Minerals (MEGA MULTI MEN PO), Take 1 capsule by mouth daily., Disp: , Rfl:    nitroGLYCERIN (NITROSTAT) 0.4 MG SL tablet, Place 1 tablet (0.4 mg total) under the tongue every 5 (five) minutes as needed. For chest pain (Patient taking differently: Place 0.4 mg under the tongue every 5 (five) minutes as needed for chest pain. For chest pain), Disp: 30 tablet, Rfl: 0   sertraline (ZOLOFT) 50 MG tablet, Take 50 mg by mouth every  morning., Disp: , Rfl:    VASCEPA 1 g capsule, TAKE 2 CAPSULES(2 GRAMS) BY MOUTH TWICE DAILY (Patient taking differently: Take 2 g by mouth 2 (two) times daily.), Disp: 120 capsule, Rfl: 1   metoprolol succinate (TOPROL-XL) 100 MG 24 hr tablet, Take 1 tablet (100 mg total) by mouth daily. Take with or immediately following a meal., Disp: 90 tablet, Rfl: 3   Assessment     ICD-10-CM   1. Atherosclerosis of native coronary artery of native heart without angina pectoris  I25.10 EKG 12-Lead    metoprolol succinate (TOPROL-XL) 100 MG 24 hr tablet    2. Primary hypertension  I10 metoprolol succinate (TOPROL-XL) 100 MG 24 hr tablet    3. Paroxysmal atrial fibrillation (HCC)  I48.0     4. Peripheral artery disease (HCC)  I73.9      {CHA2DS2-VASc Score is 5.  Yearly risk of stroke: 7.2% (A, HTN, Vasc Dz, CVA).  Score of 1=0.6; 2=2.2; 3=3.2; 4=4.8; 5=7.2; 6=9.8; 7=>9.8) -(CHF; HTN; vasc disease DM,  Male = 1; Age <65 =0; 65-74 = 1,  >75 =2; stroke/embolism= 2).     Medications Discontinued During This Encounter  Medication Reason   atorvastatin (LIPITOR) 80 MG  tablet Dose change   escitalopram (LEXAPRO) 20 MG tablet    hydrOXYzine (ATARAX/VISTARIL) 25 MG tablet    busPIRone (BUSPAR) 10 MG tablet    metoprolol tartrate (LOPRESSOR) 100 MG tablet Duplicate   metoprolol succinate (TOPROL-XL) 50 MG 24 hr tablet Reorder   Meds ordered this encounter  Medications   metoprolol succinate (TOPROL-XL) 100 MG 24 hr tablet    Sig: Take 1 tablet (100 mg total) by mouth daily. Take with or immediately following a meal.    Dispense:  90 tablet    Refill:  3    Please discontinue metoprolol tartrate, duplicate    Orders Placed This Encounter  Procedures   EKG 12-Lead   Recommendations:   [name] is a 67 y.o.  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 LAD in 2017, peripheral artery disease with interventions to his lower extremity in the remote past in [place], primary hypertension, mixed hyperlipidemia, history of substance abuse remotely, tobacco use disorder quit 05/02/2022, hypertension, history of TIA and excessive alcohol use quit in July 2021, bilateral carotid endarterectomy left in December 21 and right in January 2022, family history of premature coronary artery disease in which his father died at the age of 91 and his mother at age 34 from MI with CABG in her 58s.  Patient also has history of postop A-fib RVR after carotid endarterectomy and presently on chronic Eliquis.  1. Atherosclerosis of native coronary artery of native heart without angina pectoris He has not had any recurrence of angina, dyspnea has remained stable.   He has been on metoprolol titrated also metoprolol succinate inadvertently prescribed by different provider, I have discontinue metoprolol tartrate 100 mg daily, prescribed him metoprolol succinate and increased from 50 to 100 mg daily, he does have underlying marked bradycardia and first-degree AV block that is new from previous and suspect this was related to high dose of beta-blocker therapy he was on at  200 mg of metoprolol a day.  As he remains stable and he has not had any dizziness, blood pressure is well-controlled, I will see him back again in 6 months for follow-up.  2. Primary hypertension Since patient being compliant, blood pressure under excellent control.  Presently on losartan HCT,  amlodipine and metoprolol succinate.  3. Paroxysmal atrial fibrillation (HCC) He is maintaining sinus rhythm.  He has not had any recurrence of atrial fibrillation even during ileocecectomy but did have A-fib during carotid endarterectomy.  In view of high chads vascular score, continue long-term anticoagulation, CBC remained stable.  Renal function is normal.  4.  Mixed hyperlipidemia Lipids under excellent control.  He is presently on atorvastatin 80 mg, and also on Vascepa.  Continue the same.  He has completely quit smoking cigarettes as of 05/02/2022 and I congratulated him.  Stable cardiovascular status.  He also has history of bilateral carotid endarterectomy and last year carotid artery duplex revealed very minimal plaque.  I do not think he needs surveillance duplex and we can consider this for next year.  I will see him back in 6 months for continued compliance and if he remains stable on annual basis.  He does have small vessel disease in his lower extremity due to diabetes and has bounding popliteal pulses otherwise.    Yates Decamp, MD, [facility] 10/14/2022, 1:17 PM Office: [phone] Fax: [phone] Pager: [phone]

## 2022-10-31 NOTE — Patient Instructions (Addendum)
SURGICAL WAITING ROOM VISITATION Patients having surgery or a procedure may have no more than 2 support people in the waiting area - these visitors may rotate in the visitor waiting room.   Due to an increase in RSV and influenza rates and associated hospitalizations, children ages 31 and under may not visit patients in [facility] hospitals. If the patient needs to stay at the hospital during part of their recovery, the visitor guidelines for inpatient rooms apply.  PRE-OP VISITATION  Pre-op nurse will coordinate an appropriate time for 1 support person to accompany the patient in pre-op.  This support person may not rotate.  This visitor will be contacted when the time is appropriate for the visitor to come back in the pre-op area.  Please refer to the [facility] website for the visitor guidelines for Inpatients (after your surgery is over and you are in a regular room).  You are not required to quarantine at this time prior to your surgery. However, you must do this: Hand Hygiene often Do NOT share personal items Notify your provider if you are in close contact with someone who has COVID or you develop fever 100.4 or greater, new onset of sneezing, cough, sore throat, shortness of breath or body aches.  If you test positive for Covid or have been in contact with anyone that has tested positive in the last 10 days please notify you surgeon.    Your procedure is scheduled on:  Wednesday   November 10, 2022  Report to [facility] Main Entrance: [facility] entrance where the [employer] is available.   Report to admitting at:  12:45  PM  Call this number if you have any questions or problems the morning of surgery [phone]  Do not eat food after Midnight the night prior to your surgery/procedure.  After Midnight you may have the following liquids until  12:00 noon the DAY OF SURGERY  Clear Liquid Diet Water Oltmann Coffee (sugar ok, NO MILK/CREAM OR CREAMERS)  Tea (sugar ok,  NO MILK/CREAM OR CREAMERS) regular and decaf                             Plain Jell-O  with no fruit (NO RED)                                           Fruit ices (not with fruit pulp, NO RED)                                     Popsicles (NO RED)                                                                  Juice: NO CITRUS JUICES: only apple, WHITE grape, WHITE cranberry Sports drinks like Gatorade or Powerade (NO RED)               FOLLOW BOWEL PREP AND ANY ADDITIONAL PRE OP INSTRUCTIONS YOU RECEIVED FROM YOUR SURGEON'S OFFICE!!!   Oral Hygiene is also important to  reduce your risk of infection.        Remember - BRUSH YOUR TEETH THE MORNING OF SURGERY WITH YOUR REGULAR TOOTHPASTE  Do NOT smoke after Midnight the night before surgery.  ELIQUIS-  Stop taking Eliquis 2 days before surgery. Last dose will be taken on Sunday  November 07, 2022  STOP TAKING all Vitamins, Herbs and supplements 1 week before your surgery.   Diabetic Medications:  Metformin- DAY BEFORE SURGERY:  take as usual.  DAY OF SURGERY:  DO NOT TAKE Metformin.  Lantus Insulin-  10 units daily,????  Day before- 100% in am  50% in pm, DOS:  50%   Take ONLY these medicines the morning of surgery with A SIP OF WATER: allopurinol, amlodipine, Metoprolol, sertralin  (Soloft)                   You may not have any metal on your body including  jewelry, and body piercing  Do not wear lotions, powders, cologne, or deodorant  Men may shave face and neck.  Contacts, Hearing Aids, dentures or bridgework may not be worn into surgery. DENTURES WILL BE REMOVED PRIOR TO SURGERY PLEASE DO NOT APPLY "Poly grip" OR ADHESIVES!!!  You may bring a small overnight bag with you on the day of surgery, only pack items that are not valuable. [facility] IS NOT RESPONSIBLE   FOR VALUABLES THAT ARE LOST OR STOLEN.   Patients discharged on the day of surgery will not be allowed to drive home.  Someone NEEDS to stay with you for the first  24 hours after anesthesia.  Do not bring your home medications to the hospital. The Pharmacy will dispense medications listed on your medication list to you during your admission in the Hospital.  Special Instructions: Bring a copy of your healthcare power of attorney and living will documents the day of surgery, if you wish to have them scanned into your [facility] Records- EPIC  Please read over the following fact sheets you were given: IF YOU HAVE QUESTIONS ABOUT YOUR PRE-OP INSTRUCTIONS, PLEASE CALL [phone]   [facility] - Preparing for Surgery Before surgery, you can play an important role.  Because skin is not sterile, your skin needs to be as free of germs as possible.  You can reduce the number of germs on your skin by washing with CHG (chlorahexidine gluconate) soap before surgery.  CHG is an antiseptic cleaner which kills germs and bonds with the skin to continue killing germs even after washing. Please DO NOT use if you have an allergy to CHG or antibacterial soaps.  If your skin becomes reddened/irritated stop using the CHG and inform your nurse when you arrive at Short Stay. Do not shave (including legs and underarms) for at least 48 hours prior to the first CHG shower.  You may shave your face/neck.  Please follow these instructions carefully:  1.  Shower with CHG Soap the night before surgery and the  morning of surgery.  2.  If you choose to wash your hair, wash your hair first as usual with your normal  shampoo.  3.  After you shampoo, rinse your hair and body thoroughly to remove the shampoo.                             4.  Use CHG as you would any other liquid soap.  You can apply chg directly to the skin and  wash.  Gently with a scrungie or clean washcloth.  5.  Apply the CHG Soap to your body ONLY FROM THE NECK DOWN.   Do not use on face/ open                           Wound or open sores. Avoid contact with eyes, ears mouth and genitals (private parts).                        Wash face,  Genitals (private parts) with your normal soap.             6.  Wash thoroughly, paying special attention to the area where your  surgery  will be performed.  7.  Thoroughly rinse your body with warm water from the neck down.  8.  DO NOT shower/wash with your normal soap after using and rinsing off the CHG Soap.            9.  Pat yourself dry with a clean towel.            10.  Wear clean pajamas.            11.  Place clean sheets on your bed the night of your first shower and do not  sleep with pets.  ON THE DAY OF SURGERY : Do not apply any lotions/deodorants the morning of surgery.  Please wear clean clothes to the hospital/surgery center.     FAILURE TO FOLLOW THESE INSTRUCTIONS MAY RESULT IN THE CANCELLATION OF YOUR SURGERY  PATIENT SIGNATURE_________________________________  NURSE SIGNATURE__________________________________  ________________________________________________________________________

## 2022-10-31 NOTE — Progress Notes (Addendum)
COVID Vaccine received:  []  No [x]  Yes Date of any COVID positive Test in last 90 days:  none  PCP - Velta Addison, MD Cardiologist - Yates Decamp, MD (LOV 10-14-2022)  Chest x-ray - 04-16-2021  Epic EKG -  10-14-2022  Epic Stress Test -  ECHO - 04-29-2021  Epic Cardiac Cath - multiple caths by Dr. Jacinto Halim, Last 12-18-2015  Epic  PCR screen: []  Ordered & Completed           []   No Order but Needs PROFEND           [x]   N/A for this surgery  Surgery Plan:  [x]  Ambulatory                            []  Outpatient in bed                            []  Admit  Anesthesia:    [x]  General  []  Spinal                           []   Choice []   MAC  Bowel Prep - []  No  [x]   Yes ____Patient has prep instructions at home__  Pacemaker / ICD device [x]  No []  Yes   Spinal Cord Stimulator:[x]  No []  Yes       History of Sleep Apnea? [x]  No []  Yes   CPAP used?- [x]  No []  Yes    Does the patient monitor blood sugar?          []  No []  Yes  []  N/A  Patient has: []  NO Hx DM   []  Pre-DM                 []  DM1  [x]   DM2 Does patient have a [employer] or Dexacom? [x]  No []  Yes   Fasting Blood Sugar Ranges- 75-125 Checks Blood Sugar 2-3_ times a day  Diabetic medications/ instructions: metformin 250 mg bid;  Hold DOS,     Lantus Insulin-  10 units daily,  Day before- 100% in am,   DOS:  50%   Blood Thinner / Instructions: Eliquis   Hold x 2 day  patient aware Aspirin Instructions:  None  ERAS Protocol Ordered: []  No  [x]  Yes PRE-SURGERY []  ENSURE  []  G2  [x]  No Drink Ordered Patient is to be NPO after: 1200  noon  Comments: This case has been rescheduled x  2 since May 2024  Activity level: Patient is able to climb a flight of stairs without difficulty; [x]  No CP  [x]  No SOB.  Patient can perform ADLs without assistance.   Anesthesia review: PAF, DM2, HTN, CAD-NSTEMI (2017) PCI-DES x2, RAS- renal artery stent done in [place], some memory loss ? TIA, Remote ETOH Abuse, recently quite smoking  (04-2022)  Patient denies shortness of breath, fever, cough and chest pain at PAT appointment.  Patient verbalized understanding and agreement to the Pre-Surgical Instructions that were given to them at this PAT appointment. Patient was also educated of the need to review these PAT instructions again prior to his surgery.I reviewed the appropriate phone numbers to call if they have any and questions or concerns.

## 2022-11-01 ENCOUNTER — Other Ambulatory Visit: Payer: Self-pay

## 2022-11-01 ENCOUNTER — Encounter (HOSPITAL_COMMUNITY): Payer: Self-pay

## 2022-11-01 ENCOUNTER — Encounter (HOSPITAL_COMMUNITY)
Admission: RE | Admit: 2022-11-01 | Discharge: 2022-11-01 | Disposition: A | Discharge status: Home / Self Care | Payer: 59 | Source: Ambulatory Visit | Attending: Surgery | Admitting: Surgery

## 2022-11-01 VITALS — BP 112/56 | HR 52 | Temp 98.6°F | Resp 18 | Ht 66.0 in | Wt 176.0 lb

## 2022-11-01 DIAGNOSIS — I150 Renovascular hypertension: Secondary | ICD-10-CM | POA: Diagnosis not present

## 2022-11-01 DIAGNOSIS — Z794 Long term (current) use of insulin: Secondary | ICD-10-CM | POA: Diagnosis not present

## 2022-11-01 DIAGNOSIS — Z955 Presence of coronary angioplasty implant and graft: Secondary | ICD-10-CM | POA: Diagnosis not present

## 2022-11-01 DIAGNOSIS — Z01812 Encounter for preprocedural laboratory examination: Secondary | ICD-10-CM | POA: Insufficient documentation

## 2022-11-01 DIAGNOSIS — Z9889 Other specified postprocedural states: Secondary | ICD-10-CM | POA: Insufficient documentation

## 2022-11-01 DIAGNOSIS — I252 Old myocardial infarction: Secondary | ICD-10-CM | POA: Insufficient documentation

## 2022-11-01 DIAGNOSIS — Z8673 Personal history of transient ischemic attack (TIA), and cerebral infarction without residual deficits: Secondary | ICD-10-CM | POA: Insufficient documentation

## 2022-11-01 DIAGNOSIS — R945 Abnormal results of liver function studies: Secondary | ICD-10-CM | POA: Diagnosis not present

## 2022-11-01 DIAGNOSIS — Z87891 Personal history of nicotine dependence: Secondary | ICD-10-CM | POA: Insufficient documentation

## 2022-11-01 DIAGNOSIS — Z7901 Long term (current) use of anticoagulants: Secondary | ICD-10-CM | POA: Diagnosis not present

## 2022-11-01 DIAGNOSIS — E119 Type 2 diabetes mellitus without complications: Secondary | ICD-10-CM | POA: Diagnosis not present

## 2022-11-01 DIAGNOSIS — K4090 Unilateral inguinal hernia, without obstruction or gangrene, not specified as recurrent: Secondary | ICD-10-CM | POA: Insufficient documentation

## 2022-11-01 DIAGNOSIS — Z01818 Encounter for other preprocedural examination: Secondary | ICD-10-CM

## 2022-11-01 HISTORY — DX: Chronic kidney disease, unspecified: N18.9

## 2022-11-01 LAB — COMPREHENSIVE METABOLIC PANEL
ALT: 16 U/L (ref 0–44)
AST: 19 U/L (ref 15–41)
Albumin: 3.8 g/dL (ref 3.5–5.0)
Alkaline Phosphatase: 86 U/L (ref 38–126)
Anion gap: 9 (ref 5–15)
BUN: 23 mg/dL (ref 8–23)
CO2: 23 mmol/L (ref 22–32)
Calcium: 8.7 mg/dL — ABNORMAL LOW (ref 8.9–10.3)
Chloride: 106 mmol/L (ref 98–111)
Creatinine, Ser: 1.11 mg/dL (ref 0.61–1.24)
GFR, Estimated: >60 mL/min (ref >60)
Glucose, Bld: 86 mg/dL (ref 70–99)
Potassium: 3.9 mmol/L (ref 3.5–5.1)
Sodium: 138 mmol/L (ref 135–145)
Total Bilirubin: 0.3 mg/dL (ref 0.3–1.2)
Total Protein: 6.9 g/dL (ref 6.5–8.1)

## 2022-11-01 LAB — CBC
HCT: 31.6 % — ABNORMAL LOW (ref 39.0–52.0)
Hemoglobin: 10.2 g/dL — ABNORMAL LOW (ref 13.0–17.0)
MCH: 28.3 pg (ref 26.0–34.0)
MCHC: 32.3 g/dL (ref 30.0–36.0)
MCV: 87.8 fL (ref 80.0–100.0)
Platelets: 216 10*3/uL (ref 150–400)
RBC: 3.6 MIL/uL — ABNORMAL LOW (ref 4.22–5.81)
RDW: 15.2 % (ref 11.5–15.5)
WBC: 7.3 10*3/uL (ref 4.0–10.5)
nRBC: 0 % (ref 0.0–0.2)

## 2022-11-01 LAB — HEMOGLOBIN A1C
Hgb A1c MFr Bld: 6.2 % — ABNORMAL HIGH (ref 4.8–5.6)
Mean Plasma Glucose: 131.24 mg/dL

## 2022-11-01 LAB — GLUCOSE, CAPILLARY: Glucose-Capillary: 73 mg/dL (ref 70–99)

## 2022-11-04 ENCOUNTER — Encounter (HOSPITAL_COMMUNITY): Payer: Self-pay

## 2022-11-04 NOTE — Progress Notes (Signed)
Case: [id number] Date/Time: 11/10/22 1445   Procedure: LAPAROSCOPIC RIGHT INGUINAL HERNIA REPAIR WITH MESH (Right)   Anesthesia type: General   Pre-op diagnosis: RIGHT INGUINAL HERNIA   Location: [facility] ROOM 02 / [facility]   Surgeons: Stechschulte, Hyman Hopes, MD       DISCUSSION: [name] is a 67 yo male who presents to PAT prior to surgery above. PMH significant for former smoking, HTN, CAD, hx of NSTEMI s/p PCI to LAD in 2017, PAD (RAS s/p stent, carotid artery stenosis s/p bilateral CEA - in 2021, 2022), HLD, TIA, short term memory loss, IDDM, PAF on Eliquis.  No prior anesthesia complications  Patient follows with Dr. Jacinto Halim due to hx of NSTEMI s/p PCI to LAD in 2017, CAD, PAF on Eliquis. Seen for OV on 04/15/2022 and received clearance at that visit however surgery was canceled due to patient taking his Eliquis on DOS. It was rescheduled again however canceled by the patient because his wife was in the hospital. It is now scheduled for the present date - 11/10/22. Patient has followed up with Dr. Jacinto Halim since the original clearance on 10/14/22 for a routine visit. He was accidentally taking metoprolol tartrate and succinate and so this was adjusted so he is only taking metoprolol succinate. HR was 52 at PAT visit. All other problems appear stable.  Per Dr. Jacinto Halim in clearance letter dated 05/09/22:  "[name] is at low risk, from a cardiac standpoint, for his upcoming procedure: right inguinal hernia repair.  It is ok to proceed without further cardiac testing.   If applicable can hold Eliquis for 2  day(s) prior to procedure and re-start 3-5  days post procedure."  Patient follows with Vascular after his bilateral CEA. Carotid US on 10/30/21 showed patent CEA sites. He is due for another Carotid US around this time.   VS: BP (!) 112/56 Comment: right arm sitting  Pulse (!) 52   Temp 37 C (Oral)   Resp 18   Ht 5\' 6"  (1.676 m)   Wt 79.8 kg   SpO2 97%   BMI 28.41 kg/m   PROVIDERS: Ellyn Hack, MD   LABS: Labs reviewed: Acceptable for surgery. (all labs ordered are listed, but only abnormal results are displayed)  Labs Reviewed  COMPREHENSIVE METABOLIC PANEL - Abnormal; Notable for the following components:      Result Value   Calcium 8.7 (*)    All other components within normal limits  CBC - Abnormal; Notable for the following components:   RBC 3.60 (*)    Hemoglobin 10.2 (*)    HCT 31.6 (*)    All other components within normal limits  HEMOGLOBIN A1C - Abnormal; Notable for the following components:   Hgb A1c MFr Bld 6.2 (*)    All other components within normal limits  GLUCOSE, CAPILLARY     IMAGES:   EKG: EKG 10/15/2022: Sinus rhythm first-degree AV block at rate of 48 bpm, leftward enlargement, otherwise normal EKG. Compared to 04/15/2022, first-degree AV block is new, previous heart rate was 51 bpm.    CV:   Carotid artery duplex 11/19/2021: 1 to 39% right ICA stenosis, right external carotid artery <50% stenosis. 1 to 39% left ICA stenosis.  Left external carotid artery occluded. Bilateral carotid endarterectomy site widely patent. Antegrade vertebral artery flow.   Echocardiogram 04/29/2021: 1. Left ventricular ejection fraction, by estimation, is 55 to 60%. The left ventricle has normal function. The left ventricle has no regional wall  motion abnormalities. Left ventricular diastolic parameters are consistent with Grade I diastolic dysfunction (impaired relaxation).   2. Right ventricular systolic function is normal. The right ventricular size is normal.   3. The mitral valve is normal in structure. Trivial mitral valve regurgitation. No evidence of mitral stenosis.   4. The aortic valve is normal in structure. Aortic valve regurgitation is not visualized. Aortic valve sclerosis is present, with no evidence of aortic valve stenosis.   5. The inferior vena cava is normal in size with greater than 50% respiratory variability, suggesting right  atrial pressure of 3 mmHg.  ABI 09/23/2020:  This exam reveals moderately decreased perfusion of the right lower  extremity, noted at the anterior tibial and post tibial artery level (ABI  0.69) and moderately decreased perfusion of the left lower extremity,  noted at the anterior tibial and post tibial artery level (ABI 0.72).  No significant change from 02/25/2016.   Left Heart Catheterization 12/18/2015:   Successful PTCA and stenting of the proximal and mid LAD with 2 overlapping 4.0 x 22 and 3.5 x 12 mm resolute integrity DES, stenosis reduced from 99% to 0%. PTCA and balloon angioplasty of the distal PDA with a 2.5 x 15 mm Emerge with reduction of stenosis from 90% to less than 30%. 1. Severe diffuse coronary artery disease, scattered disease evident throughout the RCA, PDA has tandem 60% and a 90% stenosis. Distal lesion reduced to less than 30% with balloon angioplasty. 2. High-grade complex 99% stenosis of the proximal LAD. Successful angioplasty as noted above. 3. Mild disease in the circumflex with intermediate lesions 60-70% in OM 3 and OM 4 and distal circumflex AV groove branch. 4. Abdominal aortogram: No evidence of abdominal aortic aneurysm. There are 2 renal arteries on the left, one renal artery on the right. A renal artery stent is evident on the right, renal artery is visible and patent, in-stent restenosis is present could not be evaluated due to nonselective nature of abdominal aortogram. Consider other modalities to evaluate renal artery stenosis, selective renal arteriogram was not performed as patient was not consented and also procedure performed through radial access.   Past Medical History:  Diagnosis Date   Anxiety    Arthritis    Bilateral carotid artery stenosis    s/p  bilateral CEA  2021 and 2022   Chronic anticoagulation    eliquis--- managed by dr Jacinto Halim   Chronic gouty arthritis    per pt bilateral knees and great toes   Chronic kidney disease    Renal  artery stenosis- had renal stent done in [place]   Coronary artery disease 2007   cardiologsit--- dr Jacinto Halim;   NSTEMI 12-18-2015  s/p cath w/ PCI balloon angioplasty to dPDA/  DES overlap to prox and mid LAD   Depression    DOE (dyspnea on exertion)    08-05-2022  per pt sob w/ stairs   Dysrhythmia    PAF   Emphysema lung (HCC)    Family history of premature CAD    Full dentures    History of bowel resection 04/19/2021   admission in epic ;  recent admission for DKA, 04-16-2021 readmittd 04-17-2021 abdominal pain , dx embolic mesenteric ischemic bowel  s/p ileocecectomy   History of diabetic ketoacidosis 04/16/2021   admission in epic , dx DM2   History of illicit drug use    History of non-ST elevation myocardial infarction (NSTEMI) 12/18/2015   History of transient ischemic attack (TIA)    08-05-2022  per pt remote hx, unsure when;   per imaging in epic/ care everywhere MRI  11-20-2020  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) 2017   PAD (peripheral artery disease) (HCC)    s/p  vascular intervention in [place] , pt unsure what was done but w/ cardiac cath 12-18-2015 aortagram w/ lower extremities was done showed right renal artery stent   PAF (paroxysmal atrial fibrillation) (HCC)    followed by [facility];   first started post op carotid surgery,  takes eliquis   S/P drug eluting coronary stent placement 12/18/2015   to prox and mid LAD,  balloon angioplasty to distal PDA   Short-term memory loss    08-05-2022  pt stated smoking majiuana helps with his memory   Type 2 diabetes mellitus treated with insulin (HCC)    followed by pcp   (08-05-2022  per pt checks blood sugar twice dialy,  fasting average 98-158)     Past Surgical History:  Procedure Laterality Date   CARDIAC CATHETERIZATION N/A 12/18/2015   Procedure: Left Heart Cath and Coronary Angiography;  Surgeon: Yates Decamp, MD;  Location: [facility] INVASIVE CV LAB;  Service: Cardiovascular;  Laterality: N/A;   CARDIAC CATHETERIZATION N/A 12/18/2015   Procedure: Coronary Stent Intervention;  Surgeon: Yates Decamp, MD;  Location: [facility] INVASIVE CV LAB;  Service: Cardiovascular;  Laterality: N/A;   CARDIAC CATHETERIZATION N/A 12/18/2015   Procedure: Coronary Balloon Angioplasty;  Surgeon: Yates Decamp, MD;  Location: [facility] INVASIVE CV LAB;  Service: Cardiovascular;  Laterality: N/A;   CARDIAC CATHETERIZATION  01/17/2006   @[facility]  by dr Gala Romney;   for chest pain/ palpitations/ +cocaine;    mild nonobstructive CAD   CARDIAC CATHETERIZATION  05/03/2008   @[facility]  by dr Gala Romney;  for severe CP/ severely elevated BP;  nonobstructive CAD w/ normal LVF,  ? abuse withdrawal   COLONOSCOPY N/A 01/12/2016   Procedure: COLONOSCOPY;  Surgeon: Ruffin Frederick, MD;  Location: [facility] ENDOSCOPY;  Service: Gastroenterology;  Laterality: N/A;   COLONOSCOPY WITH PROPOFOL  08/30/2022   @[email or web address] by dr Demetrius Charity. Yetta Barre   ENDARTERECTOMY Left 03/04/2020   Procedure: LEFT ENDARTERECTOMY CAROTID;  Surgeon: Larina Earthly, MD;  Location: [facility] OR;  Service: Vascular;  Laterality: Left;   ENDARTERECTOMY Right 04/22/2020   Procedure: RIGHT CAROTID ENDARTERECTOMY;  Surgeon: Larina Earthly, MD;  Location: [facility] OR;  Service: Vascular;  Laterality: Right;   LAPAROSCOPY N/A 04/19/2021   Procedure: LAPAROSCOPY DIAGNOSTIC;  Surgeon: Quentin Ore, MD;  Location: [facility] OR;  Service: General;  Laterality: N/A;   LAPAROTOMY N/A 04/19/2021   Procedure: EXPLORATORY LAPAROTOMY, ILEOCECECTOMY;  Surgeon: Quentin Ore, MD;  Location: [facility] OR;  Service: General;  Laterality: N/A;   PATCH ANGIOPLASTY Left 03/04/2020   Procedure: PATCH ANGIOPLASTY;  Surgeon: Larina Earthly, MD;   Location: [facility] OR;  Service: Vascular;  Laterality: Left;   PERIPHERAL VASCULAR CATHETERIZATION N/A 12/18/2015   Procedure: Abdominal Aortogram;  Surgeon: Yates Decamp, MD;  Location: [facility] INVASIVE CV LAB;  Service: Cardiovascular;  Laterality: N/A;   PERIPHERAL VASCULAR INTERVENTION     in [place]   (per pt unsure what was done or when)   UMBILICAL HERNIA REPAIR     age 85s    MEDICATIONS:  allopurinol (ZYLOPRIM)  100 MG tablet   amLODipine (NORVASC) 10 MG tablet   atorvastatin (LIPITOR) 10 MG tablet   BELSOMRA 10 MG TABS   blood glucose meter kit and supplies   colchicine 0.6 MG tablet   ELIQUIS 5 MG TABS tablet   insulin glargine (LANTUS) 100 UNIT/ML Solostar Pen   Insulin Pen Needle 32G X 4 MM MISC   losartan-hydrochlorothiazide (HYZAAR) 100-12.5 MG tablet   metFORMIN (GLUCOPHAGE) 500 MG tablet   metoprolol succinate (TOPROL-XL) 100 MG 24 hr tablet   Multiple Vitamins-Minerals (MEGA MULTI MEN PO)   nitroGLYCERIN (NITROSTAT) 0.4 MG SL tablet   sertraline (ZOLOFT) 50 MG tablet   VASCEPA 1 g capsule   No current facility-administered medications for this encounter.   Marcille Blanco [facility] Short Stay/Anesthesiology [facility] Phone [phone] 11/04/2022 8:41 AM

## 2022-11-09 NOTE — Anesthesia Preprocedure Evaluation (Addendum)
Anesthesia Evaluation  Patient identified by MRN, date of birth, ID band Patient awake    Reviewed: Allergy & Precautions, NPO status , Patient's Chart, lab work & pertinent test results  Airway Mallampati: I  TM Distance: >3 FB Neck ROM: Full    Dental no notable dental hx. (+) Edentulous Lower, Edentulous Upper   Pulmonary COPD, former smoker   Pulmonary exam normal breath sounds clear to auscultation       Cardiovascular hypertension, + CAD, + Past MI (NSTEMI 2017), + Cardiac Stents and + Peripheral Vascular Disease  Normal cardiovascular exam Rhythm:Regular Rate:Normal  04/2021 Echo 1. Left ventricular ejection fraction, by estimation, is 55 to 60%. The  left ventricle has normal function. The left ventricle has no regional  wall motion abnormalities. Left ventricular diastolic parameters are  consistent with Grade I diastolic  dysfunction (impaired relaxation).   2. Right ventricular systolic function is normal. The right ventricular  size is normal.   3. The mitral valve is normal in structure. Trivial mitral valve  regurgitation. No evidence of mitral stenosis.   4. The aortic valve is normal in structure. Aortic valve regurgitation is  not visualized. Aortic valve sclerosis is present, with no evidence of  aortic valve stenosis.   5. The inferior vena cava is normal in size with greater than 50%  respiratory variability, suggesting right atrial pressure of 3 mmHg.     Neuro/Psych  PSYCHIATRIC DISORDERS Anxiety Depression       GI/Hepatic ,,,(+)     substance abuse  marijuana use  Endo/Other  diabetes, Type 2    Renal/GU Renal diseaseLab Results      Component                Value               Date                      NA                       138                 11/01/2022                CL                       106                 11/01/2022                K                        3.9                  11/01/2022                CO2                      23                  11/01/2022                BUN                      23  11/01/2022                CREATININE               1.11                11/01/2022                GFRNONAA                 >60                 11/01/2022                CALCIUM                  8.7 (L)             11/01/2022                PHOS                     4.8 (H)             04/29/2021                ALBUMIN                  3.8                 11/01/2022                GLUCOSE                  86                  11/01/2022                Musculoskeletal  (+) Arthritis ,    Abdominal   Peds  Hematology  (+) Blood dyscrasia, anemia On eliquis  Lab Results      Component                Value               Date                      WBC                      7.3                 11/01/2022                HGB                      10.2 (L)            11/01/2022                HCT                      31.6 (L)            11/01/2022                MCV                      87.8                11/01/2022  PLT                      216                 11/01/2022              Anesthesia Other Findings All: codeine  Reproductive/Obstetrics                             Anesthesia Physical Anesthesia Plan  ASA: 3  Anesthesia Plan: General   Post-op Pain Management: Toradol IV (intra-op)*, Precedex and Tylenol PO (pre-op)*   Induction: Intravenous  PONV Risk Score and Plan: 3 and Treatment may vary due to age or medical condition, Midazolam, Dexamethasone and Ondansetron  Airway Management Planned: Oral ETT  Additional Equipment: None  Intra-op Plan:   Post-operative Plan: Extubation in OR  Informed Consent: I have reviewed the patients History and Physical, chart, labs and discussed the procedure including the risks, benefits and alternatives for the proposed anesthesia with the patient or  authorized representative who has indicated his/her understanding and acceptance.     Dental advisory given  Plan Discussed with:   Anesthesia Plan Comments:        Anesthesia Quick Evaluation

## 2022-11-10 ENCOUNTER — Ambulatory Visit (HOSPITAL_BASED_OUTPATIENT_CLINIC_OR_DEPARTMENT_OTHER): Payer: 59 | Admitting: Anesthesiology

## 2022-11-10 ENCOUNTER — Encounter (HOSPITAL_COMMUNITY)
Admission: RE | Disposition: A | Discharge status: Home / Self Care | Payer: Self-pay | Source: Home / Self Care | Attending: Surgery

## 2022-11-10 ENCOUNTER — Ambulatory Visit (HOSPITAL_COMMUNITY)
Admission: RE | Admit: 2022-11-10 | Discharge: 2022-11-10 | Disposition: A | Discharge status: Home / Self Care | Payer: 59 | Attending: Surgery | Admitting: Surgery

## 2022-11-10 ENCOUNTER — Other Ambulatory Visit: Payer: Self-pay

## 2022-11-10 ENCOUNTER — Ambulatory Visit (HOSPITAL_COMMUNITY): Payer: 59 | Admitting: Medical

## 2022-11-10 DIAGNOSIS — I480 Paroxysmal atrial fibrillation: Secondary | ICD-10-CM | POA: Insufficient documentation

## 2022-11-10 DIAGNOSIS — I129 Hypertensive chronic kidney disease with stage 1 through stage 4 chronic kidney disease, or unspecified chronic kidney disease: Secondary | ICD-10-CM | POA: Diagnosis not present

## 2022-11-10 DIAGNOSIS — F1290 Cannabis use, unspecified, uncomplicated: Secondary | ICD-10-CM | POA: Diagnosis not present

## 2022-11-10 DIAGNOSIS — J439 Emphysema, unspecified: Secondary | ICD-10-CM | POA: Insufficient documentation

## 2022-11-10 DIAGNOSIS — E1122 Type 2 diabetes mellitus with diabetic chronic kidney disease: Secondary | ICD-10-CM | POA: Insufficient documentation

## 2022-11-10 DIAGNOSIS — Z955 Presence of coronary angioplasty implant and graft: Secondary | ICD-10-CM | POA: Diagnosis not present

## 2022-11-10 DIAGNOSIS — N189 Chronic kidney disease, unspecified: Secondary | ICD-10-CM | POA: Insufficient documentation

## 2022-11-10 DIAGNOSIS — I10 Essential (primary) hypertension: Secondary | ICD-10-CM | POA: Diagnosis not present

## 2022-11-10 DIAGNOSIS — Z87891 Personal history of nicotine dependence: Secondary | ICD-10-CM | POA: Diagnosis not present

## 2022-11-10 DIAGNOSIS — Z794 Long term (current) use of insulin: Secondary | ICD-10-CM | POA: Insufficient documentation

## 2022-11-10 DIAGNOSIS — E119 Type 2 diabetes mellitus without complications: Secondary | ICD-10-CM

## 2022-11-10 DIAGNOSIS — K4090 Unilateral inguinal hernia, without obstruction or gangrene, not specified as recurrent: Secondary | ICD-10-CM | POA: Insufficient documentation

## 2022-11-10 DIAGNOSIS — I252 Old myocardial infarction: Secondary | ICD-10-CM | POA: Insufficient documentation

## 2022-11-10 DIAGNOSIS — Z7901 Long term (current) use of anticoagulants: Secondary | ICD-10-CM | POA: Insufficient documentation

## 2022-11-10 DIAGNOSIS — Z01818 Encounter for other preprocedural examination: Secondary | ICD-10-CM

## 2022-11-10 DIAGNOSIS — I2510 Atherosclerotic heart disease of native coronary artery without angina pectoris: Secondary | ICD-10-CM | POA: Insufficient documentation

## 2022-11-10 DIAGNOSIS — Z7984 Long term (current) use of oral hypoglycemic drugs: Secondary | ICD-10-CM | POA: Insufficient documentation

## 2022-11-10 DIAGNOSIS — E1151 Type 2 diabetes mellitus with diabetic peripheral angiopathy without gangrene: Secondary | ICD-10-CM | POA: Diagnosis not present

## 2022-11-10 HISTORY — PX: INGUINAL HERNIA REPAIR: SHX194

## 2022-11-10 LAB — GLUCOSE, CAPILLARY
Glucose-Capillary: 101 mg/dL — ABNORMAL HIGH (ref 70–99)
Glucose-Capillary: 151 mg/dL — ABNORMAL HIGH (ref 70–99)

## 2022-11-10 SURGERY — REPAIR, HERNIA, INGUINAL, LAPAROSCOPIC
Anesthesia: General | Laterality: Right

## 2022-11-10 MED ORDER — ONDANSETRON HCL 4 MG/2ML IJ SOLN
INTRAMUSCULAR | Status: DC | PRN
  Administered 2022-11-10: 4 mg via INTRAVENOUS

## 2022-11-10 MED ORDER — OXYCODONE HCL 5 MG PO TABS
ORAL_TABLET | ORAL | Status: AC
  Administered 2022-11-10: 5 mg via ORAL
  Filled 2022-11-10: qty 1

## 2022-11-10 MED ORDER — LIDOCAINE HCL (PF) 2 % IJ SOLN
INTRAMUSCULAR | Status: AC
  Filled 2022-11-10: qty 5

## 2022-11-10 MED ORDER — DEXAMETHASONE SODIUM PHOSPHATE 10 MG/ML IJ SOLN
INTRAMUSCULAR | Status: AC
  Filled 2022-11-10: qty 1

## 2022-11-10 MED ORDER — SUCCINYLCHOLINE CHLORIDE 200 MG/10ML IV SOSY
PREFILLED_SYRINGE | INTRAVENOUS | Status: DC | PRN
  Administered 2022-11-10: 140 mg via INTRAVENOUS

## 2022-11-10 MED ORDER — ACETAMINOPHEN 500 MG PO TABS
1000.0000 mg | ORAL_TABLET | Freq: Once | ORAL | Status: AC
  Administered 2022-11-10: 1000 mg via ORAL
  Filled 2022-11-10: qty 2

## 2022-11-10 MED ORDER — OXYCODONE HCL 5 MG/5ML PO SOLN: 5.0000 mg | Freq: Once | ORAL | Status: AC | PRN

## 2022-11-10 MED ORDER — KETOROLAC TROMETHAMINE 30 MG/ML IJ SOLN
INTRAMUSCULAR | Status: AC
  Administered 2022-11-10: 30 mg via INTRAVENOUS
  Filled 2022-11-10: qty 1

## 2022-11-10 MED ORDER — CHLORHEXIDINE GLUCONATE CLOTH 2 % EX PADS: 6.0000 | MEDICATED_PAD | Freq: Once | CUTANEOUS | Status: DC

## 2022-11-10 MED ORDER — BUPIVACAINE LIPOSOME 1.3 % IJ SUSP: 20.0000 mL | Freq: Once | INTRAMUSCULAR | Status: DC

## 2022-11-10 MED ORDER — 0.9 % SODIUM CHLORIDE (POUR BTL) OPTIME
TOPICAL | Status: DC | PRN
  Administered 2022-11-10: 1000 mL

## 2022-11-10 MED ORDER — LACTATED RINGERS IV SOLN: INTRAVENOUS | Status: DC

## 2022-11-10 MED ORDER — KETOROLAC TROMETHAMINE 30 MG/ML IJ SOLN: 30.0000 mg | Freq: Once | INTRAMUSCULAR | Status: AC | PRN

## 2022-11-10 MED ORDER — BUPIVACAINE-EPINEPHRINE 0.25% -1:200000 IJ SOLN
INTRAMUSCULAR | Status: AC
  Filled 2022-11-10: qty 1

## 2022-11-10 MED ORDER — CHLORHEXIDINE GLUCONATE 0.12 % MT SOLN
15.0000 mL | Freq: Once | OROMUCOSAL | Status: AC
  Administered 2022-11-10: 15 mL via OROMUCOSAL

## 2022-11-10 MED ORDER — ONDANSETRON HCL 4 MG/2ML IJ SOLN
4.0000 mg | Freq: Once | INTRAMUSCULAR | Status: AC | PRN
  Administered 2022-11-10: 4 mg via INTRAVENOUS

## 2022-11-10 MED ORDER — ROCURONIUM BROMIDE 10 MG/ML (PF) SYRINGE
PREFILLED_SYRINGE | INTRAVENOUS | Status: AC
  Filled 2022-11-10: qty 10

## 2022-11-10 MED ORDER — BUPIVACAINE LIPOSOME 1.3 % IJ SUSP
INTRAMUSCULAR | Status: DC | PRN
  Administered 2022-11-10: 20 mL

## 2022-11-10 MED ORDER — BUPIVACAINE-EPINEPHRINE (PF) 0.25% -1:200000 IJ SOLN
INTRAMUSCULAR | Status: DC | PRN
  Administered 2022-11-10: 30 mL via PERINEURAL

## 2022-11-10 MED ORDER — ONDANSETRON HCL 4 MG/2ML IJ SOLN
INTRAMUSCULAR | Status: AC
  Filled 2022-11-10: qty 2

## 2022-11-10 MED ORDER — SUGAMMADEX SODIUM 200 MG/2ML IV SOLN
INTRAVENOUS | Status: DC | PRN
  Administered 2022-11-10: 200 mg via INTRAVENOUS

## 2022-11-10 MED ORDER — OXYCODONE HCL 5 MG PO TABS: 5.0000 mg | ORAL_TABLET | Freq: Once | ORAL | Status: AC | PRN

## 2022-11-10 MED ORDER — KETAMINE HCL 50 MG/5ML IJ SOSY
PREFILLED_SYRINGE | INTRAMUSCULAR | Status: AC
  Filled 2022-11-10: qty 5

## 2022-11-10 MED ORDER — HYDROMORPHONE HCL 1 MG/ML IJ SOLN
0.2500 mg | INTRAMUSCULAR | Status: DC | PRN
  Administered 2022-11-10 (×2): 0.5 mg via INTRAVENOUS

## 2022-11-10 MED ORDER — ACETAMINOPHEN 500 MG PO TABS: 1000.0000 mg | ORAL_TABLET | ORAL | Status: DC

## 2022-11-10 MED ORDER — GABAPENTIN 300 MG PO CAPS
300.0000 mg | ORAL_CAPSULE | ORAL | Status: AC
  Administered 2022-11-10: 300 mg via ORAL
  Filled 2022-11-10: qty 1

## 2022-11-10 MED ORDER — DEXAMETHASONE SODIUM PHOSPHATE 10 MG/ML IJ SOLN
INTRAMUSCULAR | Status: DC | PRN
  Administered 2022-11-10: 10 mg via INTRAVENOUS

## 2022-11-10 MED ORDER — FENTANYL CITRATE (PF) 250 MCG/5ML IJ SOLN
INTRAMUSCULAR | Status: AC
  Filled 2022-11-10: qty 5

## 2022-11-10 MED ORDER — CEFAZOLIN SODIUM-DEXTROSE 2-4 GM/100ML-% IV SOLN
2.0000 g | INTRAVENOUS | Status: AC
  Administered 2022-11-10: 2 g via INTRAVENOUS
  Filled 2022-11-10: qty 100

## 2022-11-10 MED ORDER — ROCURONIUM BROMIDE 10 MG/ML (PF) SYRINGE
PREFILLED_SYRINGE | INTRAVENOUS | Status: DC | PRN
  Administered 2022-11-10: 40 mg via INTRAVENOUS

## 2022-11-10 MED ORDER — INSULIN ASPART 100 UNIT/ML IJ SOLN: 0.0000 [IU] | INTRAMUSCULAR | Status: DC | PRN

## 2022-11-10 MED ORDER — MIDAZOLAM HCL 2 MG/2ML IJ SOLN
INTRAMUSCULAR | Status: AC
  Filled 2022-11-10: qty 2

## 2022-11-10 MED ORDER — MIDAZOLAM HCL 2 MG/2ML IJ SOLN
INTRAMUSCULAR | Status: DC | PRN
  Administered 2022-11-10: 2 mg via INTRAVENOUS

## 2022-11-10 MED ORDER — LIDOCAINE 2% (20 MG/ML) 5 ML SYRINGE
INTRAMUSCULAR | Status: DC | PRN
  Administered 2022-11-10: 100 mg via INTRAVENOUS

## 2022-11-10 MED ORDER — HYDROMORPHONE HCL 1 MG/ML IJ SOLN
INTRAMUSCULAR | Status: AC
  Administered 2022-11-10: 0.5 mg via INTRAVENOUS
  Filled 2022-11-10: qty 2

## 2022-11-10 MED ORDER — KETAMINE HCL 10 MG/ML IJ SOLN
INTRAMUSCULAR | Status: DC | PRN
  Administered 2022-11-10: 20 mg via INTRAVENOUS

## 2022-11-10 MED ORDER — FENTANYL CITRATE (PF) 250 MCG/5ML IJ SOLN
INTRAMUSCULAR | Status: DC | PRN
  Administered 2022-11-10: 50 ug via INTRAVENOUS
  Administered 2022-11-10 (×2): 100 ug via INTRAVENOUS

## 2022-11-10 MED ORDER — ENOXAPARIN SODIUM 40 MG/0.4ML IJ SOSY
40.0000 mg | PREFILLED_SYRINGE | Freq: Once | INTRAMUSCULAR | Status: AC
  Administered 2022-11-10: 40 mg via SUBCUTANEOUS
  Filled 2022-11-10: qty 0.4

## 2022-11-10 MED ORDER — OXYCODONE-ACETAMINOPHEN 5-325 MG PO TABS: 1.0000 | ORAL_TABLET | ORAL | 0 refills | Status: DC | PRN

## 2022-11-10 MED ORDER — PROPOFOL 10 MG/ML IV BOLUS
INTRAVENOUS | Status: AC
  Filled 2022-11-10: qty 20

## 2022-11-10 MED ORDER — ORAL CARE MOUTH RINSE: 15.0000 mL | Freq: Once | OROMUCOSAL | Status: AC

## 2022-11-10 MED ORDER — BUPIVACAINE LIPOSOME 1.3 % IJ SUSP
INTRAMUSCULAR | Status: AC
  Filled 2022-11-10: qty 20

## 2022-11-10 MED ORDER — KETOROLAC TROMETHAMINE 15 MG/ML IJ SOLN
15.0000 mg | INTRAMUSCULAR | Status: AC
  Administered 2022-11-10: 15 mg via INTRAVENOUS
  Filled 2022-11-10: qty 1

## 2022-11-10 MED ORDER — PROPOFOL 10 MG/ML IV BOLUS
INTRAVENOUS | Status: DC | PRN
Start: 2022-11-10 — End: 2022-11-10
  Administered 2022-11-10: 180 mg via INTRAVENOUS

## 2022-11-10 SURGICAL SUPPLY — 37 items
ADH SKN CLS APL DERMABOND .7 (GAUZE/BANDAGES/DRESSINGS) ×1
APL PRP STRL LF DISP 70% ISPRP (MISCELLANEOUS) ×1
BAG COUNTER SPONGE SURGICOUNT (BAG) ×1 IMPLANT
BAG SPNG CNTER NS LX DISP (BAG) ×1
CABLE HIGH FREQUENCY MONO STRZ (ELECTRODE) ×1 IMPLANT
CHLORAPREP W/TINT 26 (MISCELLANEOUS) ×1 IMPLANT
DERMABOND ADVANCED .7 DNX12 (GAUZE/BANDAGES/DRESSINGS) ×1 IMPLANT
ELECT REM PT RETURN 15FT ADLT (MISCELLANEOUS) ×1 IMPLANT
GLOVE BIO SURGEON STRL SZ7.5 (GLOVE) ×1 IMPLANT
GLOVE INDICATOR 8.0 STRL GRN (GLOVE) ×1 IMPLANT
GOWN STRL REUS W/ TWL XL LVL3 (GOWN DISPOSABLE) ×2 IMPLANT
GOWN STRL REUS W/TWL XL LVL3 (GOWN DISPOSABLE) ×2
GRASPER SUT TROCAR 14GX15 (MISCELLANEOUS) ×1 IMPLANT
IRRIG SUCT STRYKERFLOW 2 WTIP (MISCELLANEOUS)
IRRIGATION SUCT STRKRFLW 2 WTP (MISCELLANEOUS) IMPLANT
KIT BASIN OR (CUSTOM PROCEDURE TRAY) ×1 IMPLANT
KIT TURNOVER KIT A (KITS) IMPLANT
MARKER SKIN DUAL TIP RULER LAB (MISCELLANEOUS) ×1 IMPLANT
MESH 3DMAX 5X7 RT XLRG (Mesh General) IMPLANT
NDL INSUFFLATION 14GA 120MM (NEEDLE) ×2 IMPLANT
NEEDLE INSUFFLATION 14GA 120MM (NEEDLE) ×2
RELOAD STAPLE 4.0 BLU F/HERNIA (INSTRUMENTS) IMPLANT
RELOAD STAPLE 4.8 BLK F/HERNIA (STAPLE) ×1 IMPLANT
RELOAD STAPLE HERNIA 4.0 BLUE (INSTRUMENTS)
RELOAD STAPLE HERNIA 4.8 BLK (STAPLE) ×1
SCISSORS LAP 5X35 DISP (ENDOMECHANICALS) ×1 IMPLANT
SET TUBE SMOKE EVAC HIGH FLOW (TUBING) ×1 IMPLANT
SPIKE FLUID TRANSFER (MISCELLANEOUS) IMPLANT
STAPLER HERNIA 12 8.5 360D (INSTRUMENTS) ×1 IMPLANT
SUT MNCRL AB 4-0 PS2 18 (SUTURE) ×1 IMPLANT
SUT VICRYL 0 UR6 27IN ABS (SUTURE) IMPLANT
TOWEL OR 17X26 10 PK STRL BLUE (TOWEL DISPOSABLE) ×1 IMPLANT
TRAY FOL W/BAG SLVR 16FR STRL (SET/KITS/TRAYS/PACK) ×1 IMPLANT
TRAY FOLEY W/BAG SLVR 16FR LF (SET/KITS/TRAYS/PACK) ×1
TRAY LAPAROSCOPIC (CUSTOM PROCEDURE TRAY) ×1 IMPLANT
TROCAR ADV FIXATION 12X100MM (TROCAR) ×1 IMPLANT
TROCAR Z-THREAD OPTICAL 5X100M (TROCAR) ×2 IMPLANT

## 2022-11-10 NOTE — Op Note (Signed)
   Patient: [name] (10/13/1955, [id number])  Date of Surgery: 11/10/2022   Preoperative Diagnosis: RIGHT INGUINAL HERNIA   Postoperative Diagnosis: RIGHT INGUINAL HERNIA   Surgical Procedure: LAPAROSCOPIC RIGHT INGUINAL HERNIA REPAIR WITH MESH: [id number]   Operative Team Members:  Surgeons and Role:    * , Hyman Hopes, MD - Primary   Anesthesiologist: Trevor Iha, MD CRNA: Florene Route, CRNA   Anesthesia: General   Fluids:  Total I/O In: 300 [I.V.:300] Out: 25 [Blood:25]  Complications: None  Drains:  None  Specimen: None  Disposition:  PACU - hemodynamically stable.  Plan of Care: Discharge to home after PACU  Indications for Procedure: AYVEN CAHAN is a 67 y.o. male who presented with a right inguinal hernia.  I recommended laparoscopic repair. We discussed the procedure, its risks, benefits and alternatives.  After a full discussion and all questions answered the patient granted consent to proceed.  Findings:  Technique: Transabdominal preperitoneal (TAPP) Hernia Location: Right indirect inguinal hernia Mesh Size &Type:  Bard 3D max extra-large right sided mesh Mesh Fixation: Endo-Universal hernia stapler  Infection status: Patient: Private Patient Elective Case Case: Elective Infection Present At Time Of Surgery (PATOS): None   Description of Procedure:  The patient was positioned supine, padded and secured to the bed, with both arms tucked.  The abdomen was widely prepped and draped.  A time out procedure was performed.  A 1 cm infraumbilical incision was made.  The abdomen was entered without trauma to the underlying viscera.  The abdomen was insufflated to 15 mm of Hg.  A 12 mm trocar was inserted at the periumbilical incision.  Additional 5 mm trocars were placed in the left and right abdomen.  There was no trauma to the underlying viscera.  There was an indirect hernia on the RIGHT.  Utilizing a transabdominal pre peritoneal technique  (TAPP), a horizontal incision was made in the peritoneum, immediately below the umbilicus.  Dissection was carried out in the pre peritoneal space down to the level of the hernia sac which was reduced into the peritoneal cavity completely.  The cord contents were parietalized and preserved.  A large pre peritoneal dissection was performed to uncover the direct, indirect, femoral and obturator spaces.  Cooper's ligament was uncovered medially and the psoas muscle uncovered laterally.  The mesh, as documented above, was opened and advanced into the pre peritoneal position so that it more than adequately covered the indirect, direct, femoral and obturator spaces.  The mesh laid flat, with no inferior folds and covered the entire myopectineal orifice.  The mesh was fixated with the endo-universal hernia stapler to Cooper's ligament and the posterior aspect of the rectus muscle.  The peritoneal flap was closed with the same device.  There were no peritoneal defects or exposed mesh at the conclusion.  The umbilical trocar was removed and the fascial defect was closed with a 0 Vicryl suture.  The peritoneal cavity was completely desufflated, the trocars removed and the skin closed with 4-0 Monocryl subcuticular suture and skin glue.  All sponge and needle counts were correct at the end of the case.  Ivar Drape, MD General, Bariatric, & Minimally Invasive Surgery [facility], [place]

## 2022-11-10 NOTE — Anesthesia Procedure Notes (Signed)
Procedure Name: Intubation Date/Time: 11/10/2022 3:11 PM  Performed by: Florene Route, CRNAPre-anesthesia Checklist: Patient identified, Emergency Drugs available, Suction available and Patient being monitored Patient Re-evaluated:Patient Re-evaluated prior to induction Oxygen Delivery Method: Circle system utilized Preoxygenation: Pre-oxygenation with 100% oxygen Induction Type: IV induction, Rapid sequence and Cricoid Pressure applied Laryngoscope Size: Miller and 3 Grade View: Grade I Tube type: Oral Tube size: 8.0 mm Number of attempts: 1 Airway Equipment and Method: Stylet and Oral airway Placement Confirmation: ETT inserted through vocal cords under direct vision, positive ETCO2 and breath sounds checked- equal and bilateral Secured at: 22 cm Tube secured with: Tape Dental Injury: Teeth and Oropharynx as per pre-operative assessment

## 2022-11-10 NOTE — Transfer of Care (Signed)
Immediate Anesthesia Transfer of Care Note  Patient: [name]  Procedure(s) Performed: LAPAROSCOPIC RIGHT INGUINAL HERNIA REPAIR WITH MESH (Right)  Patient Location: PACU  Anesthesia Type:General  Level of Consciousness: drowsy  Airway & Oxygen Therapy: Patient Spontanous Breathing and Patient connected to face mask oxygen  Post-op Assessment: Report given to RN and Post -op Vital signs reviewed and stable  Post vital signs: Reviewed and stable  Last Vitals:  Vitals Value Taken Time  BP 180/75 11/10/22 1613  Temp    Pulse 70 11/10/22 1614  Resp 20 11/10/22 1614  SpO2 100 % 11/10/22 1614  Vitals shown include unfiled device data.  Last Pain:  Vitals:   11/10/22 1400  TempSrc:   PainSc: 0-No pain      Patients Stated Pain Goal: 4 (11/10/22 1400)  Complications: No notable events documented.

## 2022-11-10 NOTE — Anesthesia Postprocedure Evaluation (Signed)
Anesthesia Post Note  Patient: [name]  Procedure(s) Performed: LAPAROSCOPIC RIGHT INGUINAL HERNIA REPAIR WITH MESH (Right)     Patient location during evaluation: PACU Anesthesia Type: General Level of consciousness: awake and alert Pain management: pain level controlled Vital Signs Assessment: post-procedure vital signs reviewed and stable Respiratory status: spontaneous breathing, nonlabored ventilation, respiratory function stable and patient connected to nasal cannula oxygen Cardiovascular status: blood pressure returned to baseline and stable Postop Assessment: no apparent nausea or vomiting Anesthetic complications: no   No notable events documented.  Last Vitals:  Vitals:   11/10/22 1700 11/10/22 1715  BP: (!) 172/93 (!) 150/64  Pulse: 71 64  Resp: 18 19  Temp:    SpO2: 99% 94%    Last Pain:  Vitals:   11/10/22 1715  TempSrc:   PainSc: 5                  Trevor Iha

## 2022-11-10 NOTE — H&P (Signed)
Admitting Physician: Hyman Hopes   Service: General Surgery  CC: Inguinal hernia  Subjective   HPI: [name] is an 67 y.o. male who is here for hernia repair  Past Medical History:  Diagnosis Date   Anxiety    Arthritis    Bilateral carotid artery stenosis    s/p  bilateral CEA  2021 and 2022   Chronic anticoagulation    eliquis--- managed by dr Jacinto Halim   Chronic gouty arthritis    per pt bilateral knees and great toes   Chronic kidney disease    Renal artery stenosis- had renal stent done in [place]   Coronary artery disease 2007   cardiologsit--- dr Jacinto Halim;   NSTEMI 12-18-2015  s/p cath w/ PCI balloon angioplasty to dPDA/  DES overlap to prox and mid LAD   Depression    DOE (dyspnea on exertion)    08-05-2022  per pt sob w/ stairs   Emphysema lung (HCC)    Family history of premature CAD    Full dentures    History of bowel resection 04/19/2021   admission in epic ;  recent admission for DKA, 04-16-2021 readmittd 04-17-2021 abdominal pain , dx embolic mesenteric ischemic bowel  s/p ileocecectomy   History of diabetic ketoacidosis 04/16/2021   admission in epic , dx DM2   History of illicit drug use    History of non-ST elevation myocardial infarction (NSTEMI) 12/18/2015   History of transient ischemic attack (TIA)    08-05-2022  per pt remote hx, unsure when;   per imaging in epic/ care everywhere MRI  11-20-2020  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 (pons, basal ganglia, periventrcular white matter of cerebellar)   Hyperlipidemia    Hypertension    Ischemic segment of 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-s/p exploratory laparotomy on 1/22 04/29/2021   PAD (peripheral artery disease) (HCC)    s/p  vascular intervention in [place] , pt unsure what was done but w/ cardiac cath 12-18-2015 aortagram w/ lower extremities was done showed right renal artery stent   PAF (paroxysmal atrial fibrillation) (HCC)     followed by [facility]ji;   first started post op carotid surgery,  takes eliquis   S/P drug eluting coronary stent placement 12/18/2015   to prox and mid LAD,  balloon angioplasty to distal PDA   Short-term memory loss    08-05-2022  pt stated smoking majiuana helps with his memory   Type 2 diabetes mellitus treated with insulin (HCC)    followed by pcp   (08-05-2022  per pt checks blood sugar twice dialy,  fasting average 98-158)    Past Surgical History:  Procedure Laterality Date   CARDIAC CATHETERIZATION N/A 12/18/2015   Procedure: Left Heart Cath and Coronary Angiography;  Surgeon: Yates Decamp, MD;  Location: [facility] INVASIVE CV LAB;  Service: Cardiovascular;  Laterality: N/A;   CARDIAC CATHETERIZATION N/A 12/18/2015   Procedure: Coronary Stent Intervention;  Surgeon: Yates Decamp, MD;  Location: [facility] INVASIVE CV LAB;  Service: Cardiovascular;  Laterality: N/A;   CARDIAC CATHETERIZATION N/A 12/18/2015   Procedure: Coronary Balloon Angioplasty;  Surgeon: Yates Decamp, MD;  Location: [facility] INVASIVE CV LAB;  Service: Cardiovascular;  Laterality: N/A;   CARDIAC CATHETERIZATION  01/17/2006   @[facility]  by dr Gala Romney;   for chest pain/ palpitations/ +cocaine;    mild nonobstructive CAD   CARDIAC CATHETERIZATION  05/03/2008   @[facility]  by dr bensimhon;  for severe CP/ severely elevated BP;  nonobstructive CAD w/ normal  LVF,  ? abuse withdrawal   COLONOSCOPY N/A 01/12/2016   Procedure: COLONOSCOPY;  Surgeon: Ruffin Frederick, MD;  Location: [facility] ENDOSCOPY;  Service: Gastroenterology;  Laterality: N/A;   COLONOSCOPY WITH PROPOFOL  08/30/2022   [email or web address] by dr Demetrius Charity. Yetta Barre   ENDARTERECTOMY Left 03/04/2020   Procedure: LEFT ENDARTERECTOMY CAROTID;  Surgeon: Larina Earthly, MD;  Location: [facility] OR;  Service: Vascular;  Laterality: Left;   ENDARTERECTOMY Right 04/22/2020   Procedure: RIGHT CAROTID ENDARTERECTOMY;  Surgeon: Larina Earthly, MD;  Location: [facility] OR;  Service: Vascular;  Laterality: Right;    LAPAROSCOPY N/A 04/19/2021   Procedure: LAPAROSCOPY DIAGNOSTIC;  Surgeon: Quentin Ore, MD;  Location: [facility] OR;  Service: General;  Laterality: N/A;   LAPAROTOMY N/A 04/19/2021   Procedure: EXPLORATORY LAPAROTOMY, ILEOCECECTOMY;  Surgeon: Quentin Ore, MD;  Location: [facility] OR;  Service: General;  Laterality: N/A;   PATCH ANGIOPLASTY Left 03/04/2020   Procedure: PATCH ANGIOPLASTY;  Surgeon: Larina Earthly, MD;  Location: [facility] OR;  Service: Vascular;  Laterality: Left;   PERIPHERAL VASCULAR CATHETERIZATION N/A 12/18/2015   Procedure: Abdominal Aortogram;  Surgeon: Yates Decamp, MD;  Location: [facility] INVASIVE CV LAB;  Service: Cardiovascular;  Laterality: N/A;   PERIPHERAL VASCULAR INTERVENTION     in [place]   (per pt unsure what was done or when)   UMBILICAL HERNIA REPAIR     age 77s    Family History  Problem Relation Age of Onset   Heart attack Mother    Heart attack Father    IgA nephropathy Son     Social:  reports that he quit smoking about 6 months ago. His smoking use included cigarettes. He started smoking about 23 years ago. He has a 23 pack-year smoking history. He has never used smokeless tobacco. He reports that he does not currently use alcohol. He reports current drug use. Drug: Marijuana.  Allergies:  Allergies  Allergen Reactions   Codeine Nausea And Vomiting    Medications: Current Outpatient Medications  Medication Instructions   allopurinol (ZYLOPRIM) 100 mg, Oral, Daily   amLODipine (NORVASC) 10 mg, Oral, Daily   atorvastatin (LIPITOR) 10 mg, Oral, Daily at bedtime   Belsomra 10 mg, Oral, Daily at bedtime   blood glucose meter kit and supplies Dispense based on patient and insurance preference. Use up to four times daily as directed. (FOR ICD-10 E10.9, E11.9).   colchicine 0.6 mg, Oral, Daily PRN   ELIQUIS 5 MG TABS tablet TAKE 1 TABLET(5 MG) BY MOUTH TWICE DAILY   insulin glargine (LANTUS) 22 Units, Subcutaneous, Daily   Insulin Pen Needle 32G X 4 MM MISC  1 each, Does not apply, As needed   losartan-hydrochlorothiazide (HYZAAR) 100-12.5 MG tablet 1 tablet, Oral, Daily   metFORMIN (GLUCOPHAGE) 500 mg, Oral, 2 times daily with meals   metoprolol succinate (TOPROL-XL) 100 mg, Oral, Daily, Take with or immediately following a meal.   Multiple Vitamins-Minerals (MEGA MULTI MEN PO) 1 capsule, Oral, Daily   nitroGLYCERIN (NITROSTAT) 0.4 mg, Sublingual, Every 5 min PRN, For chest pain    sertraline (ZOLOFT) 50 mg, Oral, Every morning   VASCEPA 1 g capsule TAKE 2 CAPSULES(2 GRAMS) BY MOUTH TWICE DAILY    ROS - all of the below systems have been reviewed with the patient and positives are indicated with bold text General: chills, fever or night sweats Eyes: blurry vision or double vision ENT: epistaxis or sore throat Allergy/Immunology: itchy/watery eyes or nasal congestion Hematologic/Lymphatic:  bleeding problems, blood clots or swollen lymph nodes Endocrine: temperature intolerance or unexpected weight changes Breast: new or changing breast lumps or nipple discharge Resp: cough, shortness of breath, or wheezing CV: chest pain or dyspnea on exertion GI: as per HPI GU: dysuria, trouble voiding, or hematuria MSK: joint pain or joint stiffness Neuro: TIA or stroke symptoms Derm: pruritus and skin lesion changes Psych: anxiety and depression  Objective   PE Blood pressure 138/81, pulse (!) 56, temperature 97.9 F (36.6 C), temperature source Oral, resp. rate 18, SpO2 97%. Constitutional: NAD; conversant; no deformities Eyes: Moist conjunctiva; no lid lag; anicteric; PERRL Neck: Trachea midline; no thyromegaly Lungs: Normal respiratory effort; no tactile fremitus CV: RRR; no palpable thrills; no pitting edema GI: Abd reducible right inguinal hernia MSK: Normal range of motion of extremities; no clubbing/cyanosis Psychiatric: Appropriate affect; alert and oriented x3 Lymphatic: No palpable cervical or axillary lymphadenopathy  Results for  orders placed or performed during the hospital encounter of 11/10/22 (from the past 24 hour(s))  Glucose, capillary     Status: Abnormal   Collection Time: 11/10/22  1:13 PM  Result Value Ref Range   Glucose-Capillary 101 (H) 70 - 99 mg/dL   Comment 1 Notify RN    Comment 2 Document in Chart     Imaging Orders  No imaging studies ordered today  Postoperative CT abdomen pelvis with contrast on 04/26/2021  Radiology impression:  Similar appearance of distal small bowel obstruction, with transition point near the ileocecal anastomosis. Wall thickening of the ileocolonic anastomosis is also similar in appearance, and likely represents postop edema.  Minimal ascites. No evidence of abscess or free intraperitoneal air.  On my retrospective review it appears she does have a small right-sided inguinal hernia on this exam. It certainly is larger on exam today than it was on the CT scan    Assessment and Plan   Mr. Neitzke has a right inguinal hernia   I recommended laparoscopic, possibly open, right inguinal hernia repair with mesh. The procedure itself as well as its risk, benefits, and alternatives were discussed with the patient. After full discussion all questions answered the patient granted consent to proceed. Our surgery scheduler will reach out to the patient to schedule surgery. We will ask for preoperative cardiac evaluation from his cardiologist.    Quentin Ore, MD  [facility] Use [email or web address] to contact on call provider

## 2022-11-10 NOTE — Discharge Instructions (Signed)
 GROIN HERNIA REPAIR POST OPERATIVE INSTRUCTIONS  Thinking Clearly  The anesthesia may cause you to feel different for 1 or 2 days. Do not drive, drink alcohol, or make any big decisions for at least 2 days.  Nutrition When you wake up, you will be able to drink small amounts of liquid. If you do not feel sick, you can slowly advance your diet to regular foods. Continue to drink lots of fluids, usually about 8 to 10 glasses per day. Eat a high-fiber diet so you don't strain during bowel movements. High-Fiber Foods Foods high in fiber include beans, bran cereals and whole-grain breads, peas, dried fruit (figs, apricots, and dates), raspberries, blackberries, strawberries, sweet corn, broccoli, baked potatoes with skin, plums, pears, apples, greens, and nuts. Activity Slowly increase your activity. Be sure to get up and walk every hour or so to prevent blood clots. No heavy lifting or strenuous activity for 4 weeks following surgery to prevent hernias at your incision sites or recurrence of your hernia. It is normal to feel tired. You may need more sleep than usual.  Get your rest but make sure to get up and move around frequently to prevent blood clots and pneumonia.  Work and Return to School You can go back to work when you feel well enough. Discuss the timing with your surgeon. You can usually go back to school or work 1 week or less after an laparoscopic or an open repair. If your work requires heavy lifting or strenuous activity you need to be placed on light duty for 4 weeks following surgery. You can return to gym class, sports or other physical activities 4 weeks after surgery.  Wound Care You may experience significant bruising in the groin including into the scrotum in males.  Rest, elevating the groin and scrotum above the level of the heart, ice and compression with tight fitting underwear can help.  Always wash your hands before and after touching near your incision site. Do  not soak in a bathtub until cleared at your follow up appointment. You may take a shower 24 hours after surgery. A small amount of drainage from the incision is normal. If the drainage is thick and yellow or the site is red, you may have an infection, so call your surgeon. If you have a drain in one of your incisions, it will be taken out in office when the drainage stops. Steri-Strips will fall off in 7 to 10 days or they will be removed during your first office visit. If you have dermabond glue covering over the incision, allow the glue to flake off on its own. Protect the new skin, especially from the sun. The sun can burn and cause darker scarring. Your scar will heal in about 4 to 6 weeks and will become softer and continue to fade over the next year.  The cosmetic appearance of the incisions will improve over the course of the first year after surgery. Sensation around your incision will return in a few weeks or months.  Bowel Movements After intestinal surgery, you may have loose watery stools for several days. If watery diarrhea lasts longer than 3 days, contact your surgeon. Pain medication (narcotics) can cause constipation. Increase the fiber in your diet with high-fiber foods if you are constipated. You can take an over the counter stool softener like Colace to avoid constipation.  Additional over the counter medications can also be used if Colace isn't sufficient (for example, Milk of Magnesia or Miralax).    Pain The amount of pain is different for each person. Some people need only 1 to 3 doses of pain control medication, while others need more. Take alternating doses of tylenol and ibuprofen around the clock for the first five days following surgery.  This will provide a baseline of pain control and help with inflammation.  Take the narcotic pain medication in addition if needed for severe pain.  Contact Your Surgeon at [phone], if you have: Pain that will not go away Pain that  gets worse A fever of more than 101F (38.3C) Repeated vomiting Swelling, redness, bleeding, or bad-smelling drainage from your wound site Strong abdominal pain No bowel movement or unable to pass gas for 3 days Watery diarrhea lasting longer than 3 days  Pain Control The goal of pain control is to minimize pain, keep you moving and help you heal. Your surgical team will work with you on your pain plan. Most often a combination of therapies and medications are used to control your pain. You may also be given medication (local anesthetic) at the surgical site. This may help control your pain for several days. Extreme pain puts extra stress on your body at a time when your body needs to focus on healing. Do not wait until your pain has reached a level "10" or is unbearable before telling your doctor or nurse. It is much easier to control pain before it becomes severe. Following a laparoscopic procedure, pain is sometimes felt in the shoulder. This is due to the gas inserted into your abdomen during the procedure. Moving and walking helps to decrease the gas and the right shoulder pain.  Use the guide below for ways to manage your post-operative pain. Learn more by going to facs.org/safepaincontrol.  How Intense Is My Pain Common Therapies to Feel Better       I hardly notice my pain, and it does not interfere with my activities.  I notice my pain and it distracts me, but I can still do activities (sitting up, walking, standing).  Non-Medication Therapies  Ice (in a bag, applied over clothing at the surgical site), elevation, rest, meditation, massage, distraction (music, TV, play) walking and mild exercise Splinting the abdomen with pillows +  Non-Opioid Medications Acetaminophen (Tylenol) Non-steroidal anti-inflammatory drugs (NSAIDS) Aspirin, Ibuprofen (Motrin, Advil) Naproxen (Aleve) Take these as needed, when you feel pain. Both acetaminophen and NSAIDs help to decrease pain  and swelling (inflammation).      My pain is hard to ignore and is more noticeable even when I rest.  My pain interferes with my usual activities.  Non-Medication Therapies  +  Non-Opioid medications  Take on a regular schedule (around-the-clock) instead of as needed. (For example, Tylenol every 6 hours at 9:00 am, 3:00 pm, 9:00 pm, 3:00 am and Motrin every 6 hours at 12:00 am, 6:00 am, 12:00 pm, 6:00 pm)         I am focused on my pain, and I am not doing my daily activities.  I am groaning in pain, and I cannot sleep. I am unable to do anything.  My pain is as bad as it could be, and nothing else matters.  Non-Medication Therapies  +  Around-the-Clock Non-Opioid Medications  +  Short-acting opioids  Opioids should be used with other medications to manage severe pain. Opioids block pain and give a feeling of euphoria (feel high). Addiction, a serious side effect of opioids, is rare with short-term (a few days) use.  Examples of short-acting opioids   include: Tramadol (Ultram), Hydrocodone (Norco, Vicodin), Hydromorphone (Dilaudid), Oxycodone (Oxycontin)     The above directions have been adapted from the [employer] Surgical Patient Education Program.  Please refer to the ACS website if needed: [email or web address]/-/media/[email or web address]   Paul Stechschulte, MD [facility] [address], [address], [place], [place]  [address] ?  [address] [address], [place], [place]   [address] [phone] ? [phone] ? FAX [phone] Web site: [email or web address]  

## 2022-11-11 ENCOUNTER — Encounter (HOSPITAL_COMMUNITY): Payer: Self-pay | Admitting: Surgery

## 2023-04-18 ENCOUNTER — Ambulatory Visit: Payer: Self-pay | Admitting: Cardiology

## 2023-05-11 ENCOUNTER — Ambulatory Visit: Payer: 59 | Admitting: Cardiology

## 2023-07-08 ENCOUNTER — Ambulatory Visit: Payer: 59 | Attending: Cardiology | Admitting: Cardiology

## 2023-07-11 ENCOUNTER — Encounter: Payer: Self-pay | Admitting: Cardiology

## 2023-07-28 ENCOUNTER — Encounter (HOSPITAL_COMMUNITY): Payer: Self-pay | Admitting: Psychiatry

## 2023-07-28 ENCOUNTER — Ambulatory Visit (HOSPITAL_BASED_OUTPATIENT_CLINIC_OR_DEPARTMENT_OTHER): Payer: Self-pay | Admitting: Psychiatry

## 2023-07-28 VITALS — BP 158/76 | HR 58 | Ht 66.0 in | Wt 179.0 lb

## 2023-07-28 DIAGNOSIS — R413 Other amnesia: Secondary | ICD-10-CM

## 2023-07-28 DIAGNOSIS — F1911 Other psychoactive substance abuse, in remission: Secondary | ICD-10-CM | POA: Diagnosis not present

## 2023-07-28 DIAGNOSIS — R454 Irritability and anger: Secondary | ICD-10-CM

## 2023-07-28 DIAGNOSIS — F330 Major depressive disorder, recurrent, mild: Secondary | ICD-10-CM | POA: Diagnosis not present

## 2023-07-28 DIAGNOSIS — Z630 Problems in relationship with spouse or partner: Secondary | ICD-10-CM

## 2023-07-28 DIAGNOSIS — F819 Developmental disorder of scholastic skills, unspecified: Secondary | ICD-10-CM

## 2023-07-28 MED ORDER — DIVALPROEX SODIUM ER 250 MG PO TB24: 250.0000 mg | ORAL_TABLET | Freq: Two times a day (BID) | ORAL | 0 refills | Status: DC
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
## 2023-08-11 ENCOUNTER — Telehealth: Payer: Self-pay | Admitting: Gastroenterology

## 2023-08-11 NOTE — Telephone Encounter (Signed)
 We can see him in the office for new patient visit.  He is taking Eliquis  and needs appropriate clearance for an exam. Will also need a 2-day prep for his exam given results of his last one.  Thanks.

## 2023-08-11 NOTE — Telephone Encounter (Signed)
 Good Afternoon Dr General Kenner   Patient previous provider   We have received a referral for patient to have a colon procedure.   Last procedure with [facility] in 08/2022.   Records available for review in care everywhere.   Please review and advise on scheduling.   Thank you

## 2023-08-12 ENCOUNTER — Encounter: Payer: Self-pay | Admitting: Nurse Practitioner

## 2023-08-12 NOTE — Telephone Encounter (Signed)
 Patient scheduled for o/v with PA

## 2023-08-25 ENCOUNTER — Ambulatory Visit: Attending: Cardiology | Admitting: Cardiology

## 2023-08-25 ENCOUNTER — Encounter: Payer: Self-pay | Admitting: Cardiology

## 2023-08-25 VITALS — BP 126/70 | HR 62 | Resp 16 | Ht 66.0 in | Wt 174.4 lb

## 2023-08-25 DIAGNOSIS — I2510 Atherosclerotic heart disease of native coronary artery without angina pectoris: Secondary | ICD-10-CM

## 2023-08-25 DIAGNOSIS — I4819 Other persistent atrial fibrillation: Secondary | ICD-10-CM

## 2023-08-25 DIAGNOSIS — I10 Essential (primary) hypertension: Secondary | ICD-10-CM | POA: Diagnosis not present

## 2023-08-25 DIAGNOSIS — I739 Peripheral vascular disease, unspecified: Secondary | ICD-10-CM

## 2023-08-25 DIAGNOSIS — E7800 Pure hypercholesterolemia, unspecified: Secondary | ICD-10-CM

## 2023-08-25 NOTE — Progress Notes (Signed)
 Cardiology Office Note:  .   Date:  08/25/2023  ID:  [name], DOB Sep 24, 1955, MRN [id number] PCP: Ulysees Gander, MD  [facility] Providers Cardiologist:  Knox Perl, MD   History of Present Illness: .   [name] is a 68 y.o. 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 LAD in 2017, peripheral artery disease with interventions to his lower extremity in the remote past in [place] , primary hypertension, mixed hyperlipidemia, history of substance abuse remotely, tobacco use disorder quit 05/02/2022, hypertension, history of TIA and excessive alcohol use quit in July 2021, bilateral carotid endarterectomy left in December 21 and right in January 2022, family history of premature coronary artery disease in which his father died at the age of 98 and his mother at age 56 from MI with CABG in her 54s.  Patient also has history of postop A-fib RVR after carotid endarterectomy and presently on chronic Eliquis .    His last echocardiogram on 04/29/2021 revealed EF 55 to 60% and ABI in June 2022 revealed moderately decreased perfusion of the bilateral lower extremities with no change of from 2017.  He quit smoking cigarettes in Feb 2025, 3 months ago  Discussed the use of AI scribe software for clinical note transcription with the patient, who gave verbal consent to proceed.  History of Present Illness [name] is a 68 year old male with coronary artery disease and atrial fibrillation who presents for cardiovascular follow-up.  He experienced a non-ST elevation myocardial infarction in 2017, with a stent placed in the left anterior descending artery. He is currently asymptomatic with no chest pain. His medications include amlodipine , metoprolol  succinate, and losartan .  He has peripheral arterial disease with stents placed in his legs, currently without symptoms of claudication or leg cramping. He is able to walk without issues. He has atrial fibrillation and is on  Eliquis  for stroke risk reduction, currently asymptomatic.  He quit smoking three months ago, going 'cold [place].'  Labs    Lab Results  Component Value Date   NA 138 11/01/2022   K 3.9 11/01/2022   CO2 23 11/01/2022   GLUCOSE 86 11/01/2022   BUN 23 11/01/2022   CREATININE 1.11 11/01/2022   CALCIUM  8.7 (L) 11/01/2022   EGFR 87 09/16/2021   GFRNONAA >60 11/01/2022      Latest Ref Rng & Units 11/01/2022    2:21 PM 09/16/2021    2:46 PM 04/30/2021    4:15 AM  BMP  Glucose 70 - 99 mg/dL 86  88  191   BUN 8 - 23 mg/dL 23  11  9    Creatinine 0.61 - 1.24 mg/dL 4.78  2.95  6.21   BUN/Creat Ratio 6 - 22 (calc)  NOT APPLICABLE    Sodium 135 - 145 mmol/L 138  139  138   Potassium 3.5 - 5.1 mmol/L 3.9  3.8  4.3   Chloride 98 - 111 mmol/L 106  108  107   CO2 22 - 32 mmol/L 23  21  24    Calcium  8.9 - 10.3 mg/dL 8.7  8.9  7.9       Latest Ref Rng & Units 11/01/2022    2:21 PM 09/16/2021    2:46 PM 05/03/2021    3:42 AM  CBC  WBC 4.0 - 10.5 K/uL 7.3  8.6  7.6   Hemoglobin 13.0 - 17.0 g/dL 30.8  65.7  9.3   Hematocrit 39.0 - 52.0 %  31.6  38.6  28.8   Platelets 150 - 400 K/uL 216  197  158    Lab Results  Component Value Date   HGBA1C 6.2 (H) 11/01/2022    Lab Results  Component Value Date   TSH 1.72 09/16/2021    Lab Results  Component Value Date   CHOL 73 09/16/2021   HDL 41 09/16/2021   LDLCALC 18 09/16/2021   LDLDIRECT 27 07/11/2019   TRIG 56 09/16/2021   CHOLHDL 1.8 09/16/2021    ROS  Review of Systems  Cardiovascular:  Positive for dyspnea on exertion (chronic and stable). Negative for chest pain and leg swelling.    Physical Exam:   VS:  BP 126/70 (BP Location: Right Arm, Patient Position: Sitting, Cuff Size: Normal)   Pulse 62   Resp 16   Ht 5\' 6"  (1.676 m)   Wt 174 lb 6.4 oz (79.1 kg)   SpO2 98%   BMI 28.15 kg/m    Wt Readings from Last 3 Encounters:  08/25/23 174 lb 6.4 oz (79.1 kg)  11/10/22 175 lb 15.9 oz (79.8 kg)  11/01/22 176 lb (79.8 kg)     Physical Exam Neck:     Vascular: No carotid bruit or JVD.  Cardiovascular:     Rate and Rhythm: Normal rate. Rhythm irregular.     Pulses: Intact distal pulses.          Dorsalis pedis pulses are 1+ on the right side and 1+ on the left side.       Posterior tibial pulses are 0 on the right side and 0 on the left side.     Heart sounds: No murmur heard. Pulmonary:     Effort: Pulmonary effort is normal.     Breath sounds: Normal breath sounds.  Abdominal:     General: Bowel sounds are normal.     Palpations: Abdomen is soft.  Musculoskeletal:     Right lower leg: No edema.     Left lower leg: No edema.  Skin:    Capillary Refill: Capillary refill takes less than 2 seconds.    Studies Reviewed: Aaron Aas    Left Heart Catheterization 12/18/2015:   12/18/2015: proximal and mid LAD with 2 overlapping 4.0 x 22 and 3.5 x 12 mm resolute integrity DES    EKG:    EKG Interpretation Date/Time:  Thursday Aug 25 2023 16:06:29 EDT Ventricular Rate:  65 PR Interval:    QRS Duration:  86 QT Interval:  454 QTC Calculation: 472 R Axis:   34  Text Interpretation: EKG 08/25/2023: Atrial fibrillation with controlled ventricular response at the rate of 65 bpm, normal axis, nonspecific T abnormality.  Single PVC. Compared to 10/15/2022, sinus rhythm with first-degree AV block at rate of 48 bpm has been replaced. Confirmed by Hjalmer Iovino, Jagadeesh ([id number]) on 08/25/2023 4:29:21 PM    Medications and allergies    Allergies  Allergen Reactions   Codeine Nausea And Vomiting     Current Outpatient Medications:    allopurinol  (ZYLOPRIM ) 100 MG tablet, Take 100 mg by mouth daily., Disp: , Rfl:    ALPRAZolam (XANAX) 0.5 MG tablet, Take 0.5 mg by mouth daily as needed., Disp: , Rfl:    amLODipine  (NORVASC ) 10 MG tablet, Take 10 mg by mouth daily., Disp: , Rfl:    atorvastatin  (LIPITOR ) 10 MG tablet, Take 10 mg by mouth at bedtime., Disp: , Rfl:    blood glucose meter kit and supplies, Dispense based on  patient and insurance  preference. Use up to four times daily as directed. (FOR ICD-10 E10.9, E11.9)., Disp: 1 each, Rfl: 0   colchicine  0.6 MG tablet, Take 0.6 mg by mouth daily as needed (gout flares)., Disp: , Rfl:    divalproex  (DEPAKOTE  ER) 250 MG 24 hr tablet, Take 1 tablet (250 mg total) by mouth 2 times daily at 12 noon and 4 pm., Disp: 60 tablet, Rfl: 0   ELIQUIS  5 MG TABS tablet, TAKE 1 TABLET(5 MG) BY MOUTH TWICE DAILY, Disp: 60 tablet, Rfl: 11   escitalopram  (LEXAPRO ) 20 MG tablet, Take 20 mg by mouth daily., Disp: , Rfl:    ferrous sulfate 325 (65 FE) MG EC tablet, Take 325 mg by mouth 3 (three) times daily with meals., Disp: , Rfl:    insulin  glargine (LANTUS ) 100 UNIT/ML Solostar Pen, Inject 22 Units into the skin daily. (Patient taking differently: Inject 10 Units into the skin daily.), Disp: 15 mL, Rfl: 11   Insulin  Pen Needle 32G X 4 MM MISC, 1 each by Does not apply route as needed., Disp: 200 each, Rfl: 0   losartan -hydrochlorothiazide  (HYZAAR) 100-12.5 MG tablet, Take 1 tablet by mouth daily., Disp: , Rfl:    metoprolol  succinate (TOPROL -XL) 100 MG 24 hr tablet, Take 1 tablet (100 mg total) by mouth daily. Take with or immediately following a meal., Disp: 90 tablet, Rfl: 3   Multiple Vitamins-Minerals (MEGA MULTI MEN PO), Take 1 capsule by mouth daily., Disp: , Rfl:    nitroGLYCERIN  (NITROSTAT ) 0.4 MG SL tablet, Place 1 tablet (0.4 mg total) under the tongue every 5 (five) minutes as needed. For chest pain, Disp: 30 tablet, Rfl: 0   oxyCODONE -acetaminophen  (PERCOCET) 5-325 MG tablet, Take 1 tablet by mouth every 4 (four) hours as needed for severe pain., Disp: 10 tablet, Rfl: 0   QUEtiapine  (SEROQUEL ) 25 MG tablet, Take 25 mg by mouth 2 (two) times daily., Disp: , Rfl:    VASCEPA  1 g capsule, TAKE 2 CAPSULES(2 GRAMS) BY MOUTH TWICE DAILY, Disp: 120 capsule, Rfl: 1   metFORMIN  (GLUCOPHAGE ) 500 MG tablet, Take 1 tablet (500 mg total) by mouth 2 (two) times daily with a meal. (Patient  taking differently: Take 250 mg by mouth 2 (two) times daily with a meal.), Disp: 60 tablet, Rfl: 11   No orders of the defined types were placed in this encounter.    There are no discontinued medications.   ASSESSMENT AND PLAN: .      ICD-10-CM   1. Atherosclerosis of native coronary artery of native heart without angina pectoris  I25.10 EKG 12-Lead    Comp Met (CMET)    2. Persistent atrial fibrillation (HCC)  I48.19 CBC    3. Peripheral artery disease (HCC)  I73.9     4. Primary hypertension  I10 Comp Met (CMET)    5. Hypercholesteremia  E78.00 Lipid Profile    Comp Met (CMET)      Assessment and Plan Assessment & Plan Atrial Fibrillation He is in atrial fibrillation today and is asymptomatic. The risk of stroke is mitigated by continued use of Eliquis . He is full code. - Schedule cardioversion to restore regular rhythm, discussing the use of deep sedation and electrical cardioversion with a risk of CPR at one in a thousand.  Although he is asymptomatic, would like to maintain sinus rhythm if possible.  However if he fails cardioversion or is back in A-fib on follow-up, will consider rate control strategy.  If indeed he is unable to maintain sinus  rhythm, he will need a baseline echocardiogram for follow-up.  For now no imaging tests were ordered. - Continue Eliquis . - Schedule for Direct current cardioversion. I have discussed regarding risks benefits rate control vs rhythm control with the patient. Patient understands cardiac arrest and need for CPR, aspiration pneumonia, but not limited to these. Patient is willing.    Coronary Artery Disease with History of Myocardial Infarction Non-ST elevation myocardial infarction in 2017 with stent placement in the LAD. No current chest pain or symptoms. Echocardiogram in 2023 showed normal heart function with no significant LV systolic dysfunction.  No significant valvular abnormality..  Peripheral Artery Disease Peripheral artery  disease with stents placed previously. No symptoms of claudication. Palpable leg pulses on the right, feeble on the left ankle. Blood pressure is well controlled with current medications.  Lipids are well-controlled.  Overall risk factors well-controlled and he recently quit smoking 3 months ago as well, congratulations. - Continue current medications: amlodipine  10 mg once daily, metoprolol  succinate 100 mg once daily, losartan  HCT 100/12.5 mg once daily. - Check lipids, CMP and CBC as he has not had these labs in a while.  Depression Reports depression over the past three to four months, with lack of interest in activities, social withdrawal, poor memory, and racing thoughts. - Encourage outdoor activities such as visiting a park to help improve mood.   Signed,  Knox Perl, MD, [facility] 08/25/2023, 8:44 PM [facility] [address] [place], [place] [address] Phone: [phone]. Fax:  [phone]

## 2023-08-25 NOTE — Patient Instructions (Signed)
 Medication Instructions:  Your physician recommends that you continue on your current medications as directed. Please refer to the Current Medication list given to you today.  *If you need a refill on your cardiac medications before your next appointment, please call your pharmacy*  Lab Work: Have fasting lab work done tomorrow at [facility] on the first floor of our building--Lipids, CMET and CBC If you have labs (blood work) drawn today and your tests are completely normal, you will receive your results only by: MyChart Message (if you have MyChart) OR A paper copy in the mail If you have any lab test that is abnormal or we need to change your treatment, we will call you to review the results.  Testing/Procedures: Your physician has recommended that you have a Cardioversion (DCCV). Electrical Cardioversion uses a jolt of electricity to your heart either through paddles or wired patches attached to your chest. This is a controlled, usually prescheduled, procedure. Defibrillation is done under light anesthesia in the hospital, and you usually go home the day of the procedure. This is done to get your heart back into a normal rhythm. You are not awake for the procedure. Please see the instruction sheet given to you today. Scheduled for June 2  Follow-Up: At [facility], you and your health needs are our priority.  As part of our continuing mission to provide you with exceptional heart care, our providers are all part of one team.  This team includes your primary Cardiologist (physician) and Advanced Practice Providers or APPs (Physician Assistants and Nurse Practitioners) who all work together to provide you with the care you need, when you need it.  Your next appointment:   7/31 at 9 AM  Provider:   Knox Perl, MD    We recommend signing up for the patient portal called "MyChart".  Sign up information is provided on this After Visit Summary.  MyChart is used to connect with patients  for Virtual Visits (Telemedicine).  Patients are able to view lab/test results, encounter notes, upcoming appointments, etc.  Non-urgent messages can be sent to your provider as well.   To learn more about what you can do with MyChart, go to [email or web address].   Other Instructions     Dear [name]  You are scheduled for a Cardioversion on Monday, June 2 with Dr. [name].  Please arrive at the [facility] (Main Entrance A) at [facility]: [address] [place], [place] [address] at 7:30 AM (This time is 1 hour(s) before your procedure to ensure your preparation).   Free valet parking service is available. You will check in at ADMITTING.   *Please Note: You will receive a call the day before your procedure to confirm the appointment time. That time may have changed from the original time based on the schedule for that day.*    DIET:  Nothing to eat or drink after midnight except a sip of water  with medications (see medication instructions below)  MEDICATION INSTRUCTIONS: !!IF ANY NEW MEDICATIONS ARE STARTED AFTER TODAY, PLEASE NOTIFY YOUR PROVIDER AS SOON AS POSSIBLE!!  FYI: Medications such as Semaglutide (Ozempic, [place]), Tirzepatide (Mounjaro, Zepbound), Dulaglutide (Trulicity), etc ("GLP1 agonists") AND Canagliflozin (Invokana), Dapagliflozin (Farxiga), Empagliflozin (Jardiance), Ertugliflozin (Steglatro), Bexagliflozin [employer]) or any combination with one of these drugs such as Invokamet (Canagliflozin/Metformin ), Synjardy (Empagliflozin/Metformin ), etc ("SGLT2 inhibitors") must be held around the time of a procedure. This is not a comprehensive list of all of these drugs. Please review all of your medications  and talk to your provider if you take any one of these. If you are not sure, ask your provider.   Do not take any diabetes medication the morning of the procedure    Continue taking your anticoagulant (blood thinner): Apixaban  (Eliquis ).  You will need to  continue this after your procedure until you are told by your provider that it is safe to stop.    LABS:   Come to the lab at the [facility] ([address], [place], 1st Floor) between the hours of 8:00 am and 4:30 pm. You do NOT have to be fasting.  Labs to be done on May 30  FYI:  For your safety, and to allow us  to monitor your vital signs accurately during the surgery/procedure we request: If you have artificial nails, gel coating, SNS etc, please have those removed prior to your surgery/procedure. Not having the nail coverings /polish removed may result in cancellation or delay of your surgery/procedure.  Your support person will be asked to wait in the waiting room during your procedure.  It is OK to have someone drop you off and come back when you are ready to be discharged.  You cannot drive after the procedure and will need someone to drive you home.  Bring your insurance cards.  *Special Note: Every effort is made to have your procedure done on time. Occasionally there are emergencies that occur at the hospital that may cause delays. Please be patient if a delay does occur.

## 2023-08-26 LAB — CBC

## 2023-08-26 LAB — LIPID PANEL

## 2023-08-26 NOTE — Progress Notes (Signed)
 Pt called for pre procedure instructions. Arrival time 0730 NPO after midnight explained Instructed to take am meds with sip of water and confirmed blood thinner consistency. Instructed pt need for ride home tomorrow and have responsible adult with them for 24 hrs post procedure.

## 2023-08-27 ENCOUNTER — Ambulatory Visit: Payer: Self-pay | Admitting: Cardiology

## 2023-08-27 LAB — CBC
Hematocrit: 46.7 % (ref 37.5–51.0)
Hemoglobin: 14.8 g/dL (ref 13.0–17.7)
MCH: 30.2 pg (ref 26.6–33.0)
MCHC: 31.7 g/dL (ref 31.5–35.7)
MCV: 95 fL (ref 79–97)
Platelets: 217 10*3/uL (ref 150–450)
RBC: 4.9 x10E6/uL (ref 4.14–5.80)
RDW: 17.3 % — ABNORMAL HIGH (ref 11.6–15.4)
WBC: 7.7 10*3/uL (ref 3.4–10.8)

## 2023-08-27 LAB — COMPREHENSIVE METABOLIC PANEL WITH GFR
ALT: 15 IU/L (ref 0–44)
AST: 17 IU/L (ref 0–40)
Albumin: 4.2 g/dL (ref 3.9–4.9)
Alkaline Phosphatase: 96 IU/L (ref 44–121)
BUN/Creatinine Ratio: 12 (ref 10–24)
BUN: 14 mg/dL (ref 8–27)
Bilirubin Total: 0.3 mg/dL (ref 0.0–1.2)
CO2: 21 mmol/L (ref 20–29)
Calcium: 9.1 mg/dL (ref 8.6–10.2)
Chloride: 98 mmol/L (ref 96–106)
Creatinine, Ser: 1.18 mg/dL (ref 0.76–1.27)
Globulin, Total: 2.5 g/dL (ref 1.5–4.5)
Glucose: 79 mg/dL (ref 70–99)
Potassium: 3.9 mmol/L (ref 3.5–5.2)
Sodium: 137 mmol/L (ref 134–144)
Total Protein: 6.7 g/dL (ref 6.0–8.5)
eGFR: 67 mL/min/{1.73_m2} (ref >59)

## 2023-08-27 LAB — LIPID PANEL
Chol/HDL Ratio: 2.2 ratio (ref 0.0–5.0)
Cholesterol, Total: 91 mg/dL — ABNORMAL LOW (ref 100–199)
HDL: 42 mg/dL (ref >39)
LDL Chol Calc (NIH): 35 mg/dL (ref 0–99)
Triglycerides: 62 mg/dL (ref 0–149)
VLDL Cholesterol Cal: 14 mg/dL (ref 5–40)

## 2023-08-27 NOTE — Progress Notes (Signed)
 Lipids are well controlled. NOrmal blood counts and liver and kidney function. I have forwarded them to PCP

## 2023-08-27 NOTE — H&P (View-Only) (Signed)
 Lipids are well controlled. NOrmal blood counts and liver and kidney function. I have forwarded them to PCP

## 2023-08-29 ENCOUNTER — Encounter (HOSPITAL_COMMUNITY): Payer: Self-pay

## 2023-08-29 ENCOUNTER — Encounter (HOSPITAL_COMMUNITY)
Admission: RE | Disposition: A | Discharge status: Home / Self Care | Payer: Self-pay | Source: Home / Self Care | Attending: Cardiology

## 2023-08-29 ENCOUNTER — Ambulatory Visit (HOSPITAL_COMMUNITY)
Admission: RE | Admit: 2023-08-29 | Discharge: 2023-08-29 | Disposition: A | Discharge status: Home / Self Care | Attending: Cardiology | Admitting: Cardiology

## 2023-08-29 DIAGNOSIS — I480 Paroxysmal atrial fibrillation: Secondary | ICD-10-CM

## 2023-08-29 SURGERY — CARDIOVERSION (CATH LAB)
Anesthesia: Monitor Anesthesia Care

## 2023-08-29 NOTE — Anesthesia Preprocedure Evaluation (Signed)
 Anesthesia Evaluation  Patient identified by MRN, date of birth, ID band Patient awake    Reviewed: Allergy & Precautions, NPO status , Patient's Chart, lab work & pertinent test results  Airway Mallampati: I  TM Distance: >3 FB Neck ROM: Full    Dental no notable dental hx. (+) Edentulous Lower, Edentulous Upper   Pulmonary COPD, former smoker   Pulmonary exam normal breath sounds clear to auscultation       Cardiovascular hypertension, + CAD, + Past MI (NSTEMI 2017), + Cardiac Stents and + Peripheral Vascular Disease  + dysrhythmias Atrial Fibrillation  Rhythm:Regular Rate:Normal  04/2021 Echo 1. Left ventricular ejection fraction, by estimation, is 55 to 60%. The  left ventricle has normal function. The left ventricle has no regional  wall motion abnormalities. Left ventricular diastolic parameters are  consistent with Grade I diastolic  dysfunction (impaired relaxation).   2. Right ventricular systolic function is normal. The right ventricular  size is normal.   3. The mitral valve is normal in structure. Trivial mitral valve  regurgitation. No evidence of mitral stenosis.   4. The aortic valve is normal in structure. Aortic valve regurgitation is  not visualized. Aortic valve sclerosis is present, with no evidence of  aortic valve stenosis.   5. The inferior vena cava is normal in size with greater than 50%  respiratory variability, suggesting right atrial pressure of 3 mmHg.     Neuro/Psych  PSYCHIATRIC DISORDERS Anxiety Depression       GI/Hepatic ,,,(+)     substance abuse  marijuana use  Endo/Other  diabetes, Type 2    Renal/GU Renal disease     Musculoskeletal  (+) Arthritis ,    Abdominal   Peds  Hematology  (+) Blood dyscrasia, anemia On eliquis   Lab Results      Component                Value               Date                      WBC                      7.3                 11/01/2022                 HGB                      10.2 (L)            11/01/2022                HCT                      31.6 (L)            11/01/2022                MCV                      87.8                11/01/2022                PLT  216                 11/01/2022              Anesthesia Other Findings   Reproductive/Obstetrics                              Anesthesia Physical Anesthesia Plan  ASA: 2  Anesthesia Plan: General   Post-op Pain Management:    Induction: Intravenous  PONV Risk Score and Plan: 1 and Treatment may vary due to age or medical condition  Airway Management Planned: Natural Airway and Simple Face Mask  Additional Equipment: None  Intra-op Plan:   Post-operative Plan:   Informed Consent: I have reviewed the patients History and Physical, chart, labs and discussed the procedure including the risks, benefits and alternatives for the proposed anesthesia with the patient or authorized representative who has indicated his/her understanding and acceptance.     Dental advisory given  Plan Discussed with: CRNA  Anesthesia Plan Comments:         Anesthesia Quick Evaluation

## 2023-08-29 NOTE — Interval H&P Note (Signed)
 History and Physical Interval Note:  08/29/2023 8:01 AM  [name]  has presented today for surgery, with the diagnosis of AFIB.  The various methods of treatment have been discussed with the patient and family. After consideration of risks, benefits and other options for treatment, the patient has consented to  Procedure(s): CARDIOVERSION (N/A) as a surgical intervention.  The patient's history has been reviewed, patient examined, no change in status, stable for surgery.  I have reviewed the patient's chart and labs.  Questions were answered to the patient's satisfaction.     Gaylyn Keas

## 2023-08-29 NOTE — Progress Notes (Signed)
 Patient presented for DCCV.  He missed 2 doses of Eliquis  in the past 48 hours.  HR 60bpm so no indication for TEE/DCCV.  Procedure cancelled and will reschedule patient in 3 weeks for repeat DCCV.  Patient instructed to no miss ANY doses of Eliquis .

## 2023-08-31 ENCOUNTER — Telehealth: Payer: Self-pay | Admitting: *Deleted

## 2023-08-31 NOTE — Telephone Encounter (Signed)
 [name] RN with short stay messaged Dr. Maida Sciara RN about this pt reporting to his scheduled DCCV on 6/2, which ended up being cancelled.  Per [name] RN, she was seeing pt in short stay pre-cardioversion and while doing her work-up on the pt, it was found that the pt had interrupted doses of eliquis  prior to his DCCV.  Per [name] RN pt mentioned to her he had missed 2 doses of eliquis  (night before DCCV 6/1 & morning of DCCV 6/2).  Pt mentioned to [name] RN that someone at the office told him not to take any of his medications, which I see no mention of that.  Dr. [name] was scheduled to cardiovert the pt on 6/2 and cancelled the procedure, due to the fact the pt had missed 2 doses of eliquis .  Per Dr. [name] at that time, pt to be rescheduled for DCCV in 3 weeks, with no interruption of eliquis  leading up to that time.  Pt will be due for update OV/H&P in around 3 weeks, so triage nursing to reach out to the pt to coordinate him an appt to see an APP in the next week or so, so that he can be rescheduled for his DCCV in 3 weeks, while being compliant with eliquis  dosing leading up to that time.  Pt will benefit from one-on-one resource nursing at his APP appt, so that ample pt education about DCCV instructions can be given to the pt at that time.

## 2023-08-31 NOTE — Telephone Encounter (Signed)
 Made contact with the pt about this encounter.   Pt will come into the office to see Marlyse Single PA-C on Monday 6/16 at 0850.  Pt aware to arrive 15 mins prior to this visit.  Pt aware that at this visit, we will get his DCCV rescheduled after being on uninterrupted dosing of his eliquis  for 3 consecutive weeks.   Reiterated to the pt that he must take his eliquis  twice daily with NO skipped doses.  I even had his wife write this down on paper for him.  Pt states he has short term memory issues and was confused with his previous DCCV instructions.  Pt did read back to me the importance of taking all his medications as prescribed, and to take his eliquis  twice daily with NO skipped doses.   Pt verbalized understanding and agrees with this plan.  Will send this information to Dr. Berry Bristol and his RN, to make them aware of this plan.

## 2023-09-05 ENCOUNTER — Other Ambulatory Visit (HOSPITAL_COMMUNITY): Payer: Self-pay | Admitting: Psychiatry

## 2023-09-05 DIAGNOSIS — Z630 Problems in relationship with spouse or partner: Secondary | ICD-10-CM

## 2023-09-05 DIAGNOSIS — F330 Major depressive disorder, recurrent, mild: Secondary | ICD-10-CM

## 2023-09-05 DIAGNOSIS — F1911 Other psychoactive substance abuse, in remission: Secondary | ICD-10-CM

## 2023-09-05 DIAGNOSIS — R413 Other amnesia: Secondary | ICD-10-CM

## 2023-09-05 DIAGNOSIS — R454 Irritability and anger: Secondary | ICD-10-CM

## 2023-09-07 ENCOUNTER — Telehealth: Payer: Self-pay | Admitting: Cardiology

## 2023-09-07 NOTE — Telephone Encounter (Signed)
 Returned pt's phone call regarding results. Lab draw result from 08/26/23, per Dr. Ganji given to pt.   Knox Perl, MD 08/27/2023  5:45 AM EDT     Lipids are well controlled. NOrmal blood counts and liver and kidney function. I have forwarded them to PCP    No other concerns; reminded pt of upcoming APP appointment on 09/12/23; he verbalized understanding, no other concerns.

## 2023-09-07 NOTE — Telephone Encounter (Signed)
Patient states he was returning call for results. Please advise

## 2023-09-11 NOTE — Progress Notes (Deleted)
  Cardiology Office Note:  .   Date:  09/11/2023  ID:  [name], DOB 1955/11/27, MRN [id number] PCP: Ulysees Gander, MD  [facility] Providers Cardiologist:  Knox Perl, MD {  History of Present Illness: .   [name] is a 68 y.o. male with history of CAD status post DES to proximal/mid LAD 2017, postoperative atrial fibrillation (after carotid enterectomy), PAD with reported interventions in [place] , hypertension, hyperlipidemia, history of substance/alcohol abuse, nicotine dependence (quit every 2024), TIA, bilateral carotid artery endarterectomy (left December 2021, right 2022), diabetes     CAD Stent placement to proximal and mid LAD 2017 Stable disease  Postoperative atrial fibrillation Initially noted after carotid enterectomy Had been maintaining sinus rhythm until May 2025 Status post cardioversion 08/29/2023  PAD Previously reported stents in [place] . ABI 08/2020 moderate decreased perfusion of the right and left lower extremity (anterior/posterior tibial), 0.69/0.72 respectively.  No significant change from ABI in 2017.  Bilateral carotid artery enterectomy Carotid ultrasound 10/2021 with patent stents bilaterally     Atrial fibrillation Status post recent DCCV 08/2023.  Echocardiogram was February 2023 with preserved biventricular function with no significant valvular disease.  CAD status post DES to proximal/mid LAD 2017 Stabilization of symptoms  PAD ABI 08/2020 moderate decreased perfusion of the right and left lower extremity (anterior/posterior tibial), 0.69/0.72 respectively.  No significant change from ABI in 2017.  Hypertension  Hyperlipidemia  History of substance for/alcohol abuse Nicotine dependence  Diabetes    ROS: Denies: Chest pain, shortness of breath, orthopnea, peripheral edema, palpitations, decreased exercise intolerance, fatigue, lightheadedness.   Studies Reviewed: .         Risk Assessment/Calculations:   {Does this  patient have ATRIAL FIBRILLATION?:[phone]} No BP recorded.  {Refresh Note OR Click here to enter BP  :1}***       Physical Exam:   VS:  There were no vitals taken for this visit.   Wt Readings from Last 3 Encounters:  08/25/23 174 lb 6.4 oz (79.1 kg)  11/10/22 175 lb 15.9 oz (79.8 kg)  11/01/22 176 lb (79.8 kg)    GEN: Well nourished, well developed in no acute distress NECK: No JVD; No carotid bruits CARDIAC: ***RRR, no murmurs, rubs, gallops RESPIRATORY:  Clear to auscultation without rales, wheezing or rhonchi  ABDOMEN: Soft, non-tender, non-distended EXTREMITIES:  No edema; No deformity   ASSESSMENT AND PLAN: .         {Are you ordering a CV Procedure (e.g. stress test, cath, DCCV, TEE, etc)?   Press F2        :[id number]}  Dispo: ***  Signed, Burnetta Cart, PA-C

## 2023-09-12 ENCOUNTER — Ambulatory Visit: Attending: Physician Assistant | Admitting: Physician Assistant

## 2023-09-13 ENCOUNTER — Ambulatory Visit (HOSPITAL_COMMUNITY): Admitting: Psychiatry

## 2023-09-13 ENCOUNTER — Encounter: Payer: Self-pay | Admitting: Physician Assistant

## 2023-09-15 ENCOUNTER — Encounter (HOSPITAL_COMMUNITY): Payer: Self-pay | Admitting: Psychiatry

## 2023-09-15 ENCOUNTER — Other Ambulatory Visit: Payer: Self-pay

## 2023-09-15 ENCOUNTER — Ambulatory Visit (HOSPITAL_BASED_OUTPATIENT_CLINIC_OR_DEPARTMENT_OTHER): Admitting: Psychiatry

## 2023-09-15 VITALS — BP 124/73 | HR 68 | Ht 66.0 in | Wt 178.0 lb

## 2023-09-15 DIAGNOSIS — R454 Irritability and anger: Secondary | ICD-10-CM

## 2023-09-15 DIAGNOSIS — F1911 Other psychoactive substance abuse, in remission: Secondary | ICD-10-CM

## 2023-09-15 DIAGNOSIS — F1210 Cannabis abuse, uncomplicated: Secondary | ICD-10-CM

## 2023-09-15 DIAGNOSIS — Z630 Problems in relationship with spouse or partner: Secondary | ICD-10-CM

## 2023-09-15 DIAGNOSIS — R413 Other amnesia: Secondary | ICD-10-CM

## 2023-09-15 DIAGNOSIS — F330 Major depressive disorder, recurrent, mild: Secondary | ICD-10-CM | POA: Diagnosis not present

## 2023-09-15 MED ORDER — DIVALPROEX SODIUM ER 250 MG PO TB24: ORAL_TABLET | ORAL | 1 refills | Status: DC

## 2023-09-15 NOTE — Progress Notes (Signed)
 [facility] MD/PA/NP OP Progress Note  Patient location; office Provider location; office  09/15/2023 9:01 AM ARSENIO SCHNORR  MRN:  [id number]  Chief Complaint:  Chief Complaint  Patient presents with   Follow-up   HPI: Patient is 68 year old Caucasian married man who was seen first time on May 1 as referred from primary care for the management of his memory problem.  He reported long history of memory issues and had a history of head trauma at age 66 requiring hospitalization.  Patient is a poor historian and do not remember the details.  Most of the information was obtained from the electronic medical record.  We started him on Depakote  but he is not sure if he is taking regularly.  We have referred for neurology referral but patient is not sure if he received the appointment and if he received appointment he remember going there.  He reported he may have taken the Depakote  from the pharmacy that he is currently not taking anymore.  I called the pharmacy and verified that patient picked up the Depakote  but had not picked up the refills.  He reported continued to have irritability and anger.  He usually get very upset with his wife and his 45 year old son.  Patient reported son has a lot of legal issues and he has been in and out from jail but for the past 4 years he is out of the jail and is staying with them.  Patient told his son does not do anything and he is very upset.  Patient sleeping only 2 to 3 hours.  He reported marital stress, anger, sometimes paranoia but denies any suicidal thoughts or homicidal thoughts.  He is taking Lexapro  20 mg prescribed by his PCP.  He brought medication bottles and I did not see Depakote .  He is actually taking Seroquel  25 mg which was recommended not to take due to high blood sugar.  Patient has diabetes.  He checks his blood sugar twice a day.  Recently had a procedure for A-fib and seen by gastroenterology.  Patient reported headaches, forgetfulness, memory problems.   Apparently he never had neurocognitive testing.  He reported decreased energy, motivation to do things.  He admitted not having any formal education also not helpful because he sometimes does not understand things very well.  His PCP prescribed Xanax 0.5 mg which he takes daily and keep it in the safe.  He admitted to smoking marijuana on a daily basis because that keeps him calm.  His primary care is Dr[facility] Shan Dare at [facility].Aaron Aas  His daughter lives in [place]  but patient has no contact with him.   Visit Diagnosis:    ICD-10-CM   1. Mild tetrahydrocannabinol (THC) abuse  F12.10     2. Irritability and anger  R45.4 divalproex  (DEPAKOTE  ER) 250 MG 24 hr tablet    3. Memory disorder  R41.3 divalproex  (DEPAKOTE  ER) 250 MG 24 hr tablet    4. MDD (major depressive disorder), recurrent episode, mild (HCC)  F33.0 divalproex  (DEPAKOTE  ER) 250 MG 24 hr tablet    5. History of substance abuse (HCC)  F19.11 divalproex  (DEPAKOTE  ER) 250 MG 24 hr tablet    6. Marital problem  Z63.0 divalproex  (DEPAKOTE  ER) 250 MG 24 hr tablet      Past Psychiatric History: History of suicidal attempt by cutting his arm more than 30 years ago when he was drinking heavy and admitted at [facility].  As per EMR took Zoloft , Belsomra but no details.  PCP provided Xanax, Seroquel  25 mg twice a day and Lexapro .  History of head trauma.  History of heavy drinking, crack cocaine but claimed to be sober for a while.    Past Medical History:  Past Medical History:  Diagnosis Date   Anxiety    Arthritis    Bilateral carotid artery stenosis    s/p  bilateral CEA  2021 and 2022   Chronic anticoagulation    eliquis --- managed by dr Berry Bristol   Chronic gouty arthritis    per pt bilateral knees and great toes   Chronic kidney disease    Renal artery stenosis- had renal stent done in [place]    Coronary artery disease 2007   cardiologsit--- dr Berry Bristol;   NSTEMI 12-18-2015  s/p cath w/ PCI balloon angioplasty to dPDA/  DES overlap to prox  and mid LAD   Depression    DOE (dyspnea on exertion)    08-05-2022  per pt sob w/ stairs   Emphysema lung (HCC)    Family history of premature CAD    Full dentures    History of bowel resection 04/19/2021   admission in epic ;  recent admission for DKA, 04-16-2021 readmittd 04-17-2021 abdominal pain , dx embolic mesenteric ischemic bowel  s/p ileocecectomy   History of diabetic ketoacidosis 04/16/2021   admission in epic , dx DM2   History of illicit drug use    History of non-ST elevation myocardial infarction (NSTEMI) 12/18/2015   History of transient ischemic attack (TIA)    08-05-2022  per pt remote hx, unsure when;   per imaging in epic/ care everywhere MRI  11-20-2020  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 (pons, basal ganglia, periventrcular white matter of cerebellar)   Hyperlipidemia    Hypertension    Ischemic segment of 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-s/p exploratory laparotomy on 1/22 04/29/2021   PAD (peripheral artery disease) (HCC)    s/p  vascular intervention in [place]  , pt unsure what was done but w/ cardiac cath 12-18-2015 aortagram w/ lower extremities was done showed right renal artery stent   PAF (paroxysmal atrial fibrillation) (HCC)    followed by [facility];   first started post op carotid surgery,  takes eliquis    S/P drug eluting coronary stent placement 12/18/2015   to prox and mid LAD,  balloon angioplasty to distal PDA   Short-term memory loss    08-05-2022  pt stated smoking majiuana helps with his memory   Type 2 diabetes mellitus treated with insulin  (HCC)    followed by pcp   (08-05-2022  per pt checks blood sugar twice dialy,  fasting average 98-158)    Past Surgical History:  Procedure Laterality Date   CARDIAC CATHETERIZATION N/A 12/18/2015   Procedure: Left Heart Cath and Coronary Angiography;  Surgeon: Knox Perl, MD;  Location: [facility] INVASIVE CV LAB;  Service: Cardiovascular;  Laterality: N/A;    CARDIAC CATHETERIZATION N/A 12/18/2015   Procedure: Coronary Stent Intervention;  Surgeon: Knox Perl, MD;  Location: [facility] INVASIVE CV LAB;  Service: Cardiovascular;  Laterality: N/A;   CARDIAC CATHETERIZATION N/A 12/18/2015   Procedure: Coronary Balloon Angioplasty;  Surgeon: Knox Perl, MD;  Location: [facility] INVASIVE CV LAB;  Service: Cardiovascular;  Laterality: N/A;   CARDIAC CATHETERIZATION  01/17/2006   @[facility]  by dr Julane Ny;   for chest pain/ palpitations/ +cocaine;    mild nonobstructive CAD   CARDIAC CATHETERIZATION  05/03/2008   @[facility]  by dr bensimhon;  for severe CP/ severely elevated BP;  nonobstructive CAD w/ normal LVF,  ? abuse withdrawal   COLONOSCOPY N/A 01/12/2016   Procedure: COLONOSCOPY;  Surgeon: Danette Duos, MD;  Location: [facility] ENDOSCOPY;  Service: Gastroenterology;  Laterality: N/A;   COLONOSCOPY WITH PROPOFOL   08/30/2022   @[email or web address] by dr Arlester Ladd. Rochelle Chu   ENDARTERECTOMY Left 03/04/2020   Procedure: LEFT ENDARTERECTOMY CAROTID;  Surgeon: Mayo Speck, MD;  Location: [facility] OR;  Service: Vascular;  Laterality: Left;   ENDARTERECTOMY Right 04/22/2020   Procedure: RIGHT CAROTID ENDARTERECTOMY;  Surgeon: Mayo Speck, MD;  Location: [facility] OR;  Service: Vascular;  Laterality: Right;   INGUINAL HERNIA REPAIR Right 11/10/2022   Procedure: LAPAROSCOPIC RIGHT INGUINAL HERNIA REPAIR WITH MESH;  Surgeon: Junie Olds, MD;  Location: [facility] ORS;  Service: General;  Laterality: Right;   LAPAROSCOPY N/A 04/19/2021   Procedure: LAPAROSCOPY DIAGNOSTIC;  Surgeon: Junie Olds, MD;  Location: [facility] OR;  Service: General;  Laterality: N/A;   LAPAROTOMY N/A 04/19/2021   Procedure: EXPLORATORY LAPAROTOMY, ILEOCECECTOMY;  Surgeon: Junie Olds, MD;  Location: [facility] OR;  Service: General;  Laterality: N/A;   PATCH ANGIOPLASTY Left 03/04/2020   Procedure: PATCH ANGIOPLASTY;  Surgeon: Mayo Speck, MD;  Location: [facility] OR;  Service: Vascular;  Laterality: Left;    PERIPHERAL VASCULAR CATHETERIZATION N/A 12/18/2015   Procedure: Abdominal Aortogram;  Surgeon: Knox Perl, MD;  Location: [facility] INVASIVE CV LAB;  Service: Cardiovascular;  Laterality: N/A;   PERIPHERAL VASCULAR INTERVENTION     in [place]    (per pt unsure what was done or when)   UMBILICAL HERNIA REPAIR     age 60s    Family Psychiatric History: Reviewed  Family History:  Family History  Problem Relation Age of Onset   Heart attack Mother    Heart attack Father    IgA nephropathy Son     Social History:  Social History   Socioeconomic History   Marital status: Married    Spouse name: Not on file   Number of children: 2   Years of education: Not on file   Highest education level: Not on file  Occupational History   Not on file  Tobacco Use   Smoking status: Former    Current packs/day: 0.00    Average packs/day: 1 pack/day for 23.0 years (23.0 ttl pk-yrs)    Types: Cigarettes    Start date: 05/03/1999    Quit date: 05/02/2022    Years since quitting: 1.3   Smokeless tobacco: Never  Vaping Use   Vaping status: Some Days  Substance and Sexual Activity   Alcohol use: Not Currently    Comment: quit drinking   Drug use: Yes    Types: Marijuana    Comment: 08-05-2022  per pt smokes 2-3 times a week/   hx remote ilicit drug use   Sexual activity: Not Currently  Other Topics Concern   Not on file  Social History Narrative   Not on file   Social Drivers of Health   Financial Resource Strain: Not on file  Food Insecurity: Not on file  Transportation Needs: Not on file  Physical Activity: Not on file  Stress: Not on file  Social Connections: Unknown (08/11/2021)   Received from [facility]   Social Network    Social Network: Not on file    Allergies:  Allergies  Allergen Reactions   Codeine Nausea And Vomiting    Metabolic Disorder Labs: Lab Results  Component Value Date   HGBA1C  6.2 (H) 11/01/2022   MPG 131.24 11/01/2022   MPG >398 04/19/2021   No results  found for: PROLACTIN Lab Results  Component Value Date   CHOL 91 (L) 08/26/2023   TRIG 62 08/26/2023   HDL 42 08/26/2023   CHOLHDL 2.2 08/26/2023   VLDL 25 12/18/2015   LDLCALC 35 08/26/2023   LDLCALC 18 09/16/2021   Lab Results  Component Value Date   TSH 1.72 09/16/2021   TSH 0.636 04/17/2021    Therapeutic Level Labs: No results found for: LITHIUM No results found for: VALPROATE No results found for: CBMZ  Current Medications: Current Outpatient Medications  Medication Sig Dispense Refill   albuterol (VENTOLIN HFA) 108 (90 Base) MCG/ACT inhaler Inhale 2 puffs into the lungs every 6 (six) hours as needed for wheezing or shortness of breath.     allopurinol  (ZYLOPRIM ) 100 MG tablet Take 100 mg by mouth daily.     ALPRAZolam (XANAX) 0.5 MG tablet Take 0.5 mg by mouth at bedtime.     amLODipine  (NORVASC ) 10 MG tablet Take 10 mg by mouth daily.     atorvastatin  (LIPITOR ) 10 MG tablet Take 10 mg by mouth at bedtime.     blood glucose meter kit and supplies Dispense based on patient and insurance preference. Use up to four times daily as directed. (FOR ICD-10 E10.9, E11.9). 1 each 0   colchicine  0.6 MG tablet Take 0.6 mg by mouth daily as needed (gout flares).     divalproex  (DEPAKOTE  ER) 250 MG 24 hr tablet Take 1 tablet (250 mg total) by mouth 2 times daily at 12 noon and 4 pm. 60 tablet 0   ELIQUIS  5 MG TABS tablet TAKE 1 TABLET(5 MG) BY MOUTH TWICE DAILY 60 tablet 11   escitalopram  (LEXAPRO ) 20 MG tablet Take 20 mg by mouth daily.     ferrous sulfate 325 (65 FE) MG EC tablet Take 325 mg by mouth daily.     insulin  glargine (LANTUS ) 100 UNIT/ML Solostar Pen Inject 22 Units into the skin daily. 15 mL 11   Insulin  Pen Needle 32G X 4 MM MISC 1 each by Does not apply route as needed. 200 each 0   losartan -hydrochlorothiazide  (HYZAAR) 100-12.5 MG tablet Take 1 tablet by mouth daily.     metFORMIN  (GLUCOPHAGE ) 500 MG tablet Take 1 tablet (500 mg total) by mouth 2 (two) times  daily with a meal. 60 tablet 11   metoprolol  succinate (TOPROL -XL) 100 MG 24 hr tablet Take 1 tablet (100 mg total) by mouth daily. Take with or immediately following a meal. 90 tablet 3   nitroGLYCERIN  (NITROSTAT ) 0.4 MG SL tablet Place 1 tablet (0.4 mg total) under the tongue every 5 (five) minutes as needed. For chest pain 30 tablet 0   VASCEPA  1 g capsule TAKE 2 CAPSULES(2 GRAMS) BY MOUTH TWICE DAILY 120 capsule 1   No current facility-administered medications for this visit.     Musculoskeletal: Strength & Muscle Tone: within normal limits Gait & Station: normal Patient leans: N/A  Psychiatric Specialty Exam: Review of Systems  Blood pressure 124/73, pulse 68, height 5' 6 (1.676 m), weight 178 lb (80.7 kg).Body mass index is 28.73 kg/m.  General Appearance: Fairly Groomed  Eye Contact:  Fair  Speech:  Slow  Volume:  Decreased  Mood:  Dysphoric  Affect:  Labile  Thought Process:  Descriptions of Associations: Intact  Orientation:  Full (Time, Place, and Person)  Thought Content: Rumination   Suicidal Thoughts:  No  Homicidal Thoughts:  No  Memory:  Immediate;   Fair Recent;   Fair Remote;   Fair  Judgement:  Fair  Insight:  Shallow  Psychomotor Activity:  Decreased  Concentration:  Concentration: Fair and Attention Span: Fair  Recall:  [employer] of Knowledge: Fair  Language: Fair  Akathisia:  No  Handed:  Right  AIMS (if indicated): not done  Assets:  Communication Skills Desire for Improvement Housing Transportation  ADL's:  Intact  Cognition: Impaired,  Mild  Sleep:  Fair   Screenings: PHQ2-9    Flowsheet Row Nutrition from 05/05/2021 in [facility] Nutr Diab Ed  - A Dept Of [place]. [facility]  PHQ-2 Total Score 6  PHQ-9 Total Score 18   Flowsheet Row Admission (Discharged) from 11/10/2022 in [facility]IOPERAT[facility] ARE[facility] Pre-Admission Testing 60 from 11/01/2022 in [facility] TEST[facility] Admission (Discharged) from  08/13/2022 in [facility]  C-SSRS RISK CATEGORY No Risk No Risk No Risk     Assessment and Plan: Patient is 68 year old Caucasian married man with multiple health issues including diabetes, hypertension, A-fib, coronary artery disease, gout, back pain, history of substance use, history of head trauma, learning disability memory issues and recently had procedure for A-fib.  Patient never had any neurocognitive testing.  We have referred to see neurology but patient do not remember.  He also not sure if he is taking Depakote .  I called the pharmacy and clarified that medicines were picked up but he never picked up the refills.  He noticed some improvement in his mood but still struggle with poor sleep, memory issues.  We will refer again to see neurology and may need neurocognitive testing.  Discussed about cannabis use that she did not take due to interaction with psychotropic medication.  Patient is on multiple medication including blood thinner and insulin .  Will try increasing Depakote  to take 250 mg in the morning and 500 mg at bedtime to help with sleep and mood lability.  He will also contact his PCP to get collateral information.  Recommend to discontinue quetiapine .  He is taking Lexapro  20 mg and Xanax from PCP.  Recommend to call us  back in with any questions or any concern.  Follow-up in 6 weeks in person.  Collaboration of Care: Collaboration of Care: Other provider involved in patient's care AEB we will get collateral information from PCP.  Patient/Guardian was advised Release of Information must be obtained prior to any record release in order to collaborate their care with an outside provider. Patient/Guardian was advised if they have not already done so to contact the registration department to sign all necessary forms in order for us  to release information regarding their care.   Consent: Patient/Guardian gives verbal consent for treatment and assignment of benefits for services provided  during this visit. Patient/Guardian expressed understanding and agreed to proceed.    Arturo Late, MD 09/15/2023, 9:01 AM

## 2023-09-19 ENCOUNTER — Ambulatory Visit (INDEPENDENT_AMBULATORY_CARE_PROVIDER_SITE_OTHER): Admitting: Licensed Clinical Social Worker

## 2023-09-19 DIAGNOSIS — F4310 Post-traumatic stress disorder, unspecified: Secondary | ICD-10-CM

## 2023-09-19 DIAGNOSIS — F411 Generalized anxiety disorder: Secondary | ICD-10-CM | POA: Diagnosis not present

## 2023-09-19 DIAGNOSIS — F331 Major depressive disorder, recurrent, moderate: Secondary | ICD-10-CM

## 2023-09-19 NOTE — Progress Notes (Signed)
 Cardiology Office Note    Patient Name: [name] Date of Encounter: 09/20/2023  Primary Care Provider:  Maree Leni Edyth DELENA, MD Primary Cardiologist:  Evan Bergamo, MD Primary Electrophysiologist: None   Past Medical History    Past Medical History:  Diagnosis Date   Anxiety    Arthritis    Bilateral carotid artery stenosis    s/p  bilateral CEA  2021 and 2022   Chronic anticoagulation    eliquis --- managed by dr [name]   Chronic gouty arthritis    per pt bilateral knees and great toes   Chronic kidney disease    Renal artery stenosis- had renal stent done in [place]    Coronary artery disease 2007   cardiologsit--- dr [name];   NSTEMI 12-18-2015  s/p cath w/ PCI balloon angioplasty to dPDA/  DES overlap to prox and mid LAD   Depression    DOE (dyspnea on exertion)    08-05-2022  per pt sob w/ stairs   Emphysema lung (HCC)    Family history of premature CAD    Full dentures    History of bowel resection 04/19/2021   admission in epic ;  recent admission for DKA, 04-16-2021 readmittd 04-17-2021 abdominal pain , dx embolic mesenteric ischemic bowel  s/p ileocecectomy   History of diabetic ketoacidosis 04/16/2021   admission in epic , dx DM2   History of illicit drug use    History of non-ST elevation myocardial infarction (NSTEMI) 12/18/2015   History of transient ischemic attack (TIA)    08-05-2022  per pt remote hx, unsure when;   per imaging in epic/ care everywhere MRI  11-20-2020  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 (pons, basal ganglia, periventrcular white matter of cerebellar)   Hyperlipidemia    Hypertension    Ischemic segment of 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-s/p exploratory laparotomy on 1/22 04/29/2021   PAD (peripheral artery disease) (HCC)    s/p  vascular intervention in [place]  , pt unsure what was done but w/ cardiac cath 12-18-2015 aortagram w/ lower extremities was done showed right renal artery stent    PAF (paroxysmal atrial fibrillation) (HCC)    followed by [name];   first started post op carotid surgery,  takes eliquis    S/P drug eluting coronary stent placement 12/18/2015   to prox and mid LAD,  balloon angioplasty to distal PDA   Short-term memory loss    08-05-2022  pt stated smoking majiuana helps with his memory   Type 2 diabetes mellitus treated with insulin  (HCC)    followed by pcp   (08-05-2022  per pt checks blood sugar twice dialy,  fasting average 98-158)    History of Present Illness  [name] is a 68 y.o. male with history of CAD status post DES to proximal/mid LAD 2017, postoperative atrial fibrillation (after carotid enterectomy), PAD with reported interventions in [place] , hypertension, hyperlipidemia, history of substance/alcohol abuse, nicotine dependence (quit in 2024), TIA, bilateral carotid artery endarterectomy (left December 2021, right 2022), diabetes who presents today to reschedule DCCV.  [name] has a history of postoperative atrial fibrillation following enterectomy procedure.  He was started on anticoagulation with scheduled outpatient DCCV on 6/2 that was canceled due to missing 2 doses of Eliquis .  He was scheduled for repeat DCCV with directions to complete 3 weeks of interrupted AC.  [name] presents today for follow-up and pre-DCCV visit.  He completed an EKG that verified that he is still in rate  controlled atrial fibrillation.  He is currently stable, with no recent episodes of tachycardia and is asymptomatic regarding atrial fibrillation.  He experiences a fluttering sensation only when his blood sugar drops, which resolves after taking medication to raise his blood sugar. He is compliant with his medication regimen, including Eliquis  and metoprolol , and has not missed any doses recently.  He reports no chest pain, leg pain, or swelling in his feet. He reports no palpitations unless his blood sugar drops. He notes wheezing on the right side but did not  use his inhaler today. Patient denies chest pain, palpitations, dyspnea, PND, orthopnea, nausea, vomiting, dizziness, syncope, edema, weight gain, or early satiety.  Discussed the use of AI scribe software for clinical note transcription with the patient, who gave verbal consent to proceed.  History of Present Illness   Review of Systems  Please see the history of present illness.    All other systems reviewed and are otherwise negative except as noted above.  Physical Exam    Wt Readings from Last 3 Encounters:  09/20/23 177 lb 3.2 oz (80.4 kg)  09/15/23 178 lb (80.7 kg)  08/25/23 174 lb 6.4 oz (79.1 kg)   VS: Vitals:   09/20/23 0901  BP: 128/82  Pulse: 71  SpO2: 98%  ,Body mass index is 28.6 kg/m. GEN: Well nourished, well developed in no acute distress Neck: No JVD; No carotid bruits Pulmonary: Clear to auscultation without rales, wheezing or rhonchi  Cardiovascular: Normal rate. Regular rhythm. Normal S1. Normal S2.   Murmurs: There is no murmur.  ABDOMEN: Soft, non-tender, non-distended EXTREMITIES:  No edema; No deformity   EKG/LABS/ Recent Cardiac Studies   ECG personally reviewed by me today -atrial fibrillation with rate of 61 bpm with no significant change from prior EKG  Risk Assessment/Calculations:    CHA2DS2-VASc Score = 4   This indicates a 4.8% annual risk of stroke. The patient's score is based upon: CHF History: 0 HTN History: 1 Diabetes History: 1 Stroke History: 0 Vascular Disease History: 1 Age Score: 1 Gender Score: 0         Lab Results  Component Value Date   WBC 7.7 08/26/2023   HGB 14.8 08/26/2023   HCT 46.7 08/26/2023   MCV 95 08/26/2023   PLT 217 08/26/2023   Lab Results  Component Value Date   CREATININE 1.18 08/26/2023   BUN 14 08/26/2023   NA 137 08/26/2023   K 3.9 08/26/2023   CL 98 08/26/2023   CO2 21 08/26/2023   Lab Results  Component Value Date   CHOL 91 (L) 08/26/2023   HDL 42 08/26/2023   LDLCALC 35  08/26/2023   LDLDIRECT 27 07/11/2019   TRIG 62 08/26/2023   CHOLHDL 2.2 08/26/2023    Lab Results  Component Value Date   HGBA1C 6.2 (H) 11/01/2022   Assessment & Plan    Assessment & Plan  1.  Postoperative atrial fibrillation: -Previously planned cardioversion on 08/29/2023 however patient missed doses of AC - Patient scheduled for DCCV and reports no missed doses of Eliquis  since his prior visit. - Cardioversion planned to restore rhythm. Emphasized anticoagulation adherence due to stroke risk. Explained cardioversion risks and procedure details. - Verify Eliquis  adherence before cardioversion. - Schedule cardioversion this week. - Continue Toprol -XL 100 mg daily for heart rate control. - Perform EKG on procedure day to confirm atrial fibrillation. - Discuss cardioversion risks and benefits with him.  2.  History of CAD: -Stent placement to proximal and  mid LAD 2017 Stable disease with no complaint of chest pain - Continue GDMT with Lipitor  10 mg daily, Toprol -XL 100 mg daily and as needed Nitrostat .  3. Bilateral carotid artery enterectomy: Carotid ultrasound 10/2021 with patent stents bilaterally - Continue Lipitor  10 mg and Eliquis  5 mg twice daily  4.PAD -Previously reported stents in [place] . ABI 08/2020 moderate decreased perfusion of the right and left lower extremity (anterior/posterior tibial), 0.69/0.72 respectively.  No significant change from ABI in 20  Disposition: Follow-up with Evan Bergamo, MD or APP in 6 months Informed Consent   Shared Decision Making/Informed Consent The risks (stroke, cardiac arrhythmias rarely resulting in the need for a temporary or permanent pacemaker, skin irritation or burns and complications associated with conscious sedation including aspiration, arrhythmia, respiratory failure and death), benefits (restoration of normal sinus rhythm) and alternatives of a direct current cardioversion were explained in detail to Mr. Tench and he agrees to  proceed.      Signed, Wyn Raddle, Jackee Shove, NP 09/20/2023, 9:43 AM [facility]
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
## 2023-09-19 NOTE — H&P (View-Only) (Signed)
 Cardiology Office Note    Patient Name: [name] Date of Encounter: 09/20/2023  Primary Care Provider:  Maree Leni Edyth DELENA, MD Primary Cardiologist:  Gordy Bergamo, MD Primary Electrophysiologist: None   Past Medical History    Past Medical History:  Diagnosis Date   Anxiety    Arthritis    Bilateral carotid artery stenosis    s/p  bilateral CEA  2021 and 2022   Chronic anticoagulation    eliquis --- managed by dr bergamo   Chronic gouty arthritis    per pt bilateral knees and great toes   Chronic kidney disease    Renal artery stenosis- had renal stent done in [place]    Coronary artery disease 2007   cardiologsit--- dr bergamo;   NSTEMI 12-18-2015  s/p cath w/ PCI balloon angioplasty to dPDA/  DES overlap to prox and mid LAD   Depression    DOE (dyspnea on exertion)    08-05-2022  per pt sob w/ stairs   Emphysema lung (HCC)    Family history of premature CAD    Full dentures    History of bowel resection 04/19/2021   admission in epic ;  recent admission for DKA, 04-16-2021 readmittd 04-17-2021 abdominal pain , dx embolic mesenteric ischemic bowel  s/p ileocecectomy   History of diabetic ketoacidosis 04/16/2021   admission in epic , dx DM2   History of illicit drug use    History of non-ST elevation myocardial infarction (NSTEMI) 12/18/2015   History of transient ischemic attack (TIA)    08-05-2022  per pt remote hx, unsure when;   per imaging in epic/ care everywhere MRI  11-20-2020  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 (pons, basal ganglia, periventrcular white matter of cerebellar)   Hyperlipidemia    Hypertension    Ischemic segment of 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-s/p exploratory laparotomy on 1/22 04/29/2021   PAD (peripheral artery disease) (HCC)    s/p  vascular intervention in [place]  , pt unsure what was done but w/ cardiac cath 12-18-2015 aortagram w/ lower extremities was done showed right renal artery stent    PAF (paroxysmal atrial fibrillation) (HCC)    followed by ganji;   first started post op carotid surgery,  takes eliquis    S/P drug eluting coronary stent placement 12/18/2015   to prox and mid LAD,  balloon angioplasty to distal PDA   Short-term memory loss    08-05-2022  pt stated smoking majiuana helps with his memory   Type 2 diabetes mellitus treated with insulin  (HCC)    followed by pcp   (08-05-2022  per pt checks blood sugar twice dialy,  fasting average 98-158)    History of Present Illness  [name] is a 68 y.o. male with history of CAD status post DES to proximal/mid LAD 2017, postoperative atrial fibrillation (after carotid enterectomy), PAD with reported interventions in [place] , hypertension, hyperlipidemia, history of substance/alcohol abuse, nicotine dependence (quit in 2024), TIA, bilateral carotid artery endarterectomy (left December 2021, right 2022), diabetes who presents today to reschedule DCCV.  [name] has a history of postoperative atrial fibrillation following enterectomy procedure.  He was started on anticoagulation with scheduled outpatient DCCV on 6/2 that was canceled due to missing 2 doses of Eliquis .  He was scheduled for repeat DCCV with directions to complete 3 weeks of interrupted AC.  [name] presents today for follow-up and pre-DCCV visit.  He completed an EKG that verified that he is still in rate  controlled atrial fibrillation.  He is currently stable, with no recent episodes of tachycardia and is asymptomatic regarding atrial fibrillation.  He experiences a fluttering sensation only when his blood sugar drops, which resolves after taking medication to raise his blood sugar. He is compliant with his medication regimen, including Eliquis  and metoprolol , and has not missed any doses recently.  He reports no chest pain, leg pain, or swelling in his feet. He reports no palpitations unless his blood sugar drops. He notes wheezing on the right side but did not  use his inhaler today. Patient denies chest pain, palpitations, dyspnea, PND, orthopnea, nausea, vomiting, dizziness, syncope, edema, weight gain, or early satiety.  Discussed the use of AI scribe software for clinical note transcription with the patient, who gave verbal consent to proceed.  History of Present Illness   Review of Systems  Please see the history of present illness.    All other systems reviewed and are otherwise negative except as noted above.  Physical Exam    Wt Readings from Last 3 Encounters:  09/20/23 177 lb 3.2 oz (80.4 kg)  09/15/23 178 lb (80.7 kg)  08/25/23 174 lb 6.4 oz (79.1 kg)   VS: Vitals:   09/20/23 0901  BP: 128/82  Pulse: 71  SpO2: 98%  ,Body mass index is 28.6 kg/m. GEN: Well nourished, well developed in no acute distress Neck: No JVD; No carotid bruits Pulmonary: Clear to auscultation without rales, wheezing or rhonchi  Cardiovascular: Normal rate. Regular rhythm. Normal S1. Normal S2.   Murmurs: There is no murmur.  ABDOMEN: Soft, non-tender, non-distended EXTREMITIES:  No edema; No deformity   EKG/LABS/ Recent Cardiac Studies   ECG personally reviewed by me today -atrial fibrillation with rate of 61 bpm with no significant change from prior EKG  Risk Assessment/Calculations:    CHA2DS2-VASc Score = 4   This indicates a 4.8% annual risk of stroke. The patient's score is based upon: CHF History: 0 HTN History: 1 Diabetes History: 1 Stroke History: 0 Vascular Disease History: 1 Age Score: 1 Gender Score: 0         Lab Results  Component Value Date   WBC 7.7 08/26/2023   HGB 14.8 08/26/2023   HCT 46.7 08/26/2023   MCV 95 08/26/2023   PLT 217 08/26/2023   Lab Results  Component Value Date   CREATININE 1.18 08/26/2023   BUN 14 08/26/2023   NA 137 08/26/2023   K 3.9 08/26/2023   CL 98 08/26/2023   CO2 21 08/26/2023   Lab Results  Component Value Date   CHOL 91 (L) 08/26/2023   HDL 42 08/26/2023   LDLCALC 35  08/26/2023   LDLDIRECT 27 07/11/2019   TRIG 62 08/26/2023   CHOLHDL 2.2 08/26/2023    Lab Results  Component Value Date   HGBA1C 6.2 (H) 11/01/2022   Assessment & Plan    Assessment & Plan  1.  Postoperative atrial fibrillation: -Previously planned cardioversion on 08/29/2023 however patient missed doses of AC - Patient scheduled for DCCV and reports no missed doses of Eliquis  since his prior visit. - Cardioversion planned to restore rhythm. Emphasized anticoagulation adherence due to stroke risk. Explained cardioversion risks and procedure details. - Verify Eliquis  adherence before cardioversion. - Schedule cardioversion this week. - Continue Toprol -XL 100 mg daily for heart rate control. - Perform EKG on procedure day to confirm atrial fibrillation. - Discuss cardioversion risks and benefits with him.  2.  History of CAD: -Stent placement to proximal and  mid LAD 2017 Stable disease with no complaint of chest pain - Continue GDMT with Lipitor  10 mg daily, Toprol -XL 100 mg daily and as needed Nitrostat .  3. Bilateral carotid artery enterectomy: Carotid ultrasound 10/2021 with patent stents bilaterally - Continue Lipitor  10 mg and Eliquis  5 mg twice daily  4.PAD -Previously reported stents in [place] . ABI 08/2020 moderate decreased perfusion of the right and left lower extremity (anterior/posterior tibial), 0.69/0.72 respectively.  No significant change from ABI in 20  Disposition: Follow-up with Gordy Bergamo, MD or APP in 6 months Informed Consent   Shared Decision Making/Informed Consent The risks (stroke, cardiac arrhythmias rarely resulting in the need for a temporary or permanent pacemaker, skin irritation or burns and complications associated with conscious sedation including aspiration, arrhythmia, respiratory failure and death), benefits (restoration of normal sinus rhythm) and alternatives of a direct current cardioversion were explained in detail to Mr. Tench and he agrees to  proceed.      Signed, Wyn Raddle, Jackee Shove, NP 09/20/2023, 9:43 AM [facility]

## 2023-09-20 ENCOUNTER — Ambulatory Visit: Attending: Nurse Practitioner | Admitting: Nurse Practitioner

## 2023-09-20 ENCOUNTER — Encounter: Payer: Self-pay | Admitting: Nurse Practitioner

## 2023-09-20 VITALS — BP 128/82 | HR 71 | Ht 66.0 in | Wt 177.2 lb

## 2023-09-20 DIAGNOSIS — I739 Peripheral vascular disease, unspecified: Secondary | ICD-10-CM | POA: Diagnosis not present

## 2023-09-20 DIAGNOSIS — I2510 Atherosclerotic heart disease of native coronary artery without angina pectoris: Secondary | ICD-10-CM | POA: Diagnosis not present

## 2023-09-20 DIAGNOSIS — I6523 Occlusion and stenosis of bilateral carotid arteries: Secondary | ICD-10-CM | POA: Diagnosis not present

## 2023-09-20 DIAGNOSIS — I10 Essential (primary) hypertension: Secondary | ICD-10-CM | POA: Diagnosis not present

## 2023-09-20 DIAGNOSIS — I480 Paroxysmal atrial fibrillation: Secondary | ICD-10-CM

## 2023-09-20 NOTE — Patient Instructions (Addendum)
 Medication Instructions:  Your physician recommends that you continue on your current medications as directed. Please refer to the Current Medication list given to you today. *If you need a refill on your cardiac medications before your next appointment, please call your pharmacy*  Lab Work: None ordered If you have labs (blood work) drawn today and your tests are completely normal, you will receive your results only by: MyChart Message (if you have MyChart) OR A paper copy in the mail If you have any lab test that is abnormal or we need to change your treatment, we will call you to review the results.  Testing/Procedures: Your physician has recommended that you have a Cardioversion (DCCV). Electrical Cardioversion uses a jolt of electricity to your heart either through paddles or wired patches attached to your chest. This is a controlled, usually prescheduled, procedure. Defibrillation is done under light anesthesia in the hospital, and you usually go home the day of the procedure. This is done to get your heart back into a normal rhythm. You are not awake for the procedure. Please see the instruction sheet given to you today.   Follow-Up: At [facility], you and your health needs are our priority.  As part of our continuing mission to provide you with exceptional heart care, our providers are all part of one team.  This team includes your primary Cardiologist (physician) and Advanced Practice Providers or APPs (Physician Assistants and Nurse Practitioners) who all work together to provide you with the care you need, when you need it.  Your next appointment:   6 month(s)  Provider:   Gordy Bergamo, MD    We recommend signing up for the patient portal called MyChart.  Sign up information is provided on this After Visit Summary.  MyChart is used to connect with patients for Virtual Visits (Telemedicine).  Patients are able to view lab/test results, encounter notes, upcoming  appointments, etc.  Non-urgent messages can be sent to your provider as well.   To learn more about what you can do with MyChart, go to [email or web address].   Other Instructions       Dear [name]    You are scheduled for a Cardioversion on Friday, June 27 with Dr. Kate.  Please arrive at the [facility] (Main Entrance A) at [facility]: [address] [place], [place] [address] at 11:00 AM (This time is 1 hour(s) before your procedure to ensure your preparation).   Free valet parking service is available. You will check in at ADMITTING.   *Please Note: You will receive a call the day before your procedure to confirm the appointment time. That time may have changed from the original time based on the schedule for that day.*    DIET:  Nothing to eat or drink after midnight except a sip of water  with medications (see medication instructions below)  MEDICATION INSTRUCTIONS: !!IF ANY NEW MEDICATIONS ARE STARTED AFTER TODAY, PLEASE NOTIFY YOUR PROVIDER AS SOON AS POSSIBLE!!  FYI: Medications such as Semaglutide (Ozempic, [place]), Tirzepatide (Mounjaro, Zepbound), Dulaglutide (Trulicity), etc (GLP1 agonists) AND Canagliflozin (Invokana), Dapagliflozin (Farxiga), Empagliflozin (Jardiance), Ertugliflozin (Steglatro), Bexagliflozin [employer]) or any combination with one of these drugs such as Invokamet (Canagliflozin/Metformin ), Synjardy (Empagliflozin/Metformin ), etc (SGLT2 inhibitors) must be held around the time of a procedure. This is not a comprehensive list of all of these drugs. Please review all of your medications and talk to your provider if you take any one of these. If you are not sure, ask  your provider.   HOLD LOSARTAN -HYDROCHLOROTHIAZIDE  ON THE MORNING OF YOUR TEST. Take only half of your insulin  glargine (Lantus ) the day before on 09/22/23--5 units. Do not take any insulin  or Metformin  the morning of your procedure on 09/23/23.  Continue taking your  anticoagulant (blood thinner): Apixaban  (Eliquis ).  You will need to continue this after your procedure until you are told by your provider that it is safe to stop.    LABS: None ordered  FYI:  For your safety, and to allow us  to monitor your vital signs accurately during the surgery/procedure we request: If you have artificial nails, gel coating, SNS etc, please have those removed prior to your surgery/procedure. Not having the nail coverings /polish removed may result in cancellation or delay of your surgery/procedure.  Your support person will be asked to wait in the waiting room during your procedure.  It is OK to have someone drop you off and come back when you are ready to be discharged.  You cannot drive after the procedure and will need someone to drive you home.  Bring your insurance cards.  *Special Note: Every effort is made to have your procedure done on time. Occasionally there are emergencies that occur at the hospital that may cause delays. Please be patient if a delay does occur.

## 2023-09-22 NOTE — Progress Notes (Signed)
 Called patient with pre-procedure instructions for tomorrow.   Patient informed of:   Time to arrive for procedure. 1145 Remain NPO past midnight.  Must have a ride home and a responsible adult to remain with them for 24 hours post procedure.  Confirmed blood thinner. Eliquis  Confirmed no breaks in taking blood thinner for 3+ weeks prior to procedure. Confirmed patient stopped all GLP-1s and GLP-2s for at least one week before procedure.

## 2023-09-23 ENCOUNTER — Encounter (HOSPITAL_COMMUNITY): Payer: Self-pay | Admitting: Cardiology

## 2023-09-23 ENCOUNTER — Ambulatory Visit (HOSPITAL_BASED_OUTPATIENT_CLINIC_OR_DEPARTMENT_OTHER): Admitting: Anesthesiology

## 2023-09-23 ENCOUNTER — Ambulatory Visit (HOSPITAL_COMMUNITY)
Admission: RE | Admit: 2023-09-23 | Discharge: 2023-09-23 | Disposition: A | Discharge status: Home / Self Care | Attending: Cardiology | Admitting: Cardiology

## 2023-09-23 ENCOUNTER — Other Ambulatory Visit: Payer: Self-pay

## 2023-09-23 ENCOUNTER — Encounter (HOSPITAL_COMMUNITY)
Admission: RE | Disposition: A | Discharge status: Home / Self Care | Payer: Self-pay | Source: Home / Self Care | Attending: Cardiology

## 2023-09-23 ENCOUNTER — Ambulatory Visit (HOSPITAL_COMMUNITY): Admitting: Anesthesiology

## 2023-09-23 DIAGNOSIS — I4891 Unspecified atrial fibrillation: Secondary | ICD-10-CM | POA: Diagnosis not present

## 2023-09-23 DIAGNOSIS — I10 Essential (primary) hypertension: Secondary | ICD-10-CM | POA: Diagnosis not present

## 2023-09-23 DIAGNOSIS — I252 Old myocardial infarction: Secondary | ICD-10-CM | POA: Insufficient documentation

## 2023-09-23 DIAGNOSIS — I2510 Atherosclerotic heart disease of native coronary artery without angina pectoris: Secondary | ICD-10-CM | POA: Diagnosis not present

## 2023-09-23 DIAGNOSIS — E1151 Type 2 diabetes mellitus with diabetic peripheral angiopathy without gangrene: Secondary | ICD-10-CM | POA: Diagnosis not present

## 2023-09-23 DIAGNOSIS — Z79899 Other long term (current) drug therapy: Secondary | ICD-10-CM | POA: Insufficient documentation

## 2023-09-23 DIAGNOSIS — I6523 Occlusion and stenosis of bilateral carotid arteries: Secondary | ICD-10-CM | POA: Diagnosis not present

## 2023-09-23 DIAGNOSIS — J449 Chronic obstructive pulmonary disease, unspecified: Secondary | ICD-10-CM | POA: Diagnosis not present

## 2023-09-23 DIAGNOSIS — I97191 Other postprocedural cardiac functional disturbances following other surgery: Secondary | ICD-10-CM | POA: Insufficient documentation

## 2023-09-23 DIAGNOSIS — N189 Chronic kidney disease, unspecified: Secondary | ICD-10-CM | POA: Diagnosis not present

## 2023-09-23 DIAGNOSIS — Z87891 Personal history of nicotine dependence: Secondary | ICD-10-CM

## 2023-09-23 DIAGNOSIS — I4819 Other persistent atrial fibrillation: Secondary | ICD-10-CM

## 2023-09-23 DIAGNOSIS — I480 Paroxysmal atrial fibrillation: Secondary | ICD-10-CM | POA: Diagnosis not present

## 2023-09-23 DIAGNOSIS — Z955 Presence of coronary angioplasty implant and graft: Secondary | ICD-10-CM | POA: Insufficient documentation

## 2023-09-23 DIAGNOSIS — Z8673 Personal history of transient ischemic attack (TIA), and cerebral infarction without residual deficits: Secondary | ICD-10-CM | POA: Diagnosis not present

## 2023-09-23 DIAGNOSIS — I129 Hypertensive chronic kidney disease with stage 1 through stage 4 chronic kidney disease, or unspecified chronic kidney disease: Secondary | ICD-10-CM | POA: Diagnosis not present

## 2023-09-23 DIAGNOSIS — E1122 Type 2 diabetes mellitus with diabetic chronic kidney disease: Secondary | ICD-10-CM | POA: Diagnosis not present

## 2023-09-23 DIAGNOSIS — Z95820 Peripheral vascular angioplasty status with implants and grafts: Secondary | ICD-10-CM | POA: Insufficient documentation

## 2023-09-23 DIAGNOSIS — Z7901 Long term (current) use of anticoagulants: Secondary | ICD-10-CM | POA: Insufficient documentation

## 2023-09-23 HISTORY — PX: CARDIOVERSION: EP1203

## 2023-09-23 LAB — GLUCOSE, CAPILLARY: Glucose-Capillary: 117 mg/dL — ABNORMAL HIGH (ref 70–99)

## 2023-09-23 SURGERY — CARDIOVERSION (CATH LAB)
Anesthesia: General

## 2023-09-23 MED ORDER — LIDOCAINE 2% (20 MG/ML) 5 ML SYRINGE
INTRAMUSCULAR | Status: DC | PRN
  Administered 2023-09-23: 80 mg via INTRAVENOUS

## 2023-09-23 MED ORDER — PROPOFOL 10 MG/ML IV BOLUS
INTRAVENOUS | Status: DC | PRN
  Administered 2023-09-23: 80 mg via INTRAVENOUS

## 2023-09-23 MED ORDER — METOPROLOL SUCCINATE ER 50 MG PO TB24: 50.0000 mg | ORAL_TABLET | Freq: Every day | ORAL | 3 refills | Status: AC

## 2023-09-23 MED ORDER — SODIUM CHLORIDE 0.9 % IV SOLN: INTRAVENOUS | Status: DC

## 2023-09-23 SURGICAL SUPPLY — 1 items: PAD DEFIB RADIO PHYSIO CONN (PAD) ×1 IMPLANT

## 2023-09-23 NOTE — CV Procedure (Addendum)
 Procedure:   DCCV  Indication:  Symptomatic atrial fibrillation  Procedure Note:  The patient signed informed consent.  They have had had therapeutic anticoagulation with Eliquis  greater than 3 weeks.  Anesthesia was administered by Dr. Cleotilde and Catherene Schmitz, CRNA.  Adequate airway was maintained throughout and vital followed per protocol.  They were cardioverted x 1 with 200J of biphasic synchronized energy.  They converted to NSR with rate 40-50s.  There were no apparent complications.  The patient had normal neuro status and respiratory status post procedure with vitals stable as recorded elsewhere.    Follow up:  They will continue on current medical therapy and follow up with cardiology as scheduled.  Given low HR post cardioversion, decreased toprol  XL dose to 50 mg daily  Lonni Nanas, MD 09/23/2023 11:43 AM

## 2023-09-23 NOTE — Interval H&P Note (Signed)
 History and Physical Interval Note:  09/23/2023 11:31 AM  [name]  has presented today for surgery, with the diagnosis of AFIB.  The various methods of treatment have been discussed with the patient and family. After consideration of risks, benefits and other options for treatment, the patient has consented to  Procedure(s): CARDIOVERSION (N/A) as a surgical intervention.  The patient's history has been reviewed, patient examined, no change in status, stable for surgery.  I have reviewed the patient's chart and labs.  Questions were answered to the patient's satisfaction.     [name]

## 2023-09-23 NOTE — Anesthesia Preprocedure Evaluation (Signed)
 Anesthesia Evaluation  Patient identified by MRN, date of birth, ID band Patient awake    Reviewed: Allergy & Precautions, NPO status , Patient's Chart, lab work & pertinent test results  Airway Mallampati: I  TM Distance: >3 FB Neck ROM: Full    Dental no notable dental hx. (+) Edentulous Lower, Edentulous Upper   Pulmonary COPD, former smoker   Pulmonary exam normal breath sounds clear to auscultation       Cardiovascular hypertension, + CAD, + Past MI (NSTEMI 2017), + Cardiac Stents and + Peripheral Vascular Disease  + dysrhythmias Atrial Fibrillation  Rhythm:Regular Rate:Normal  04/2021 Echo 1. Left ventricular ejection fraction, by estimation, is 55 to 60%. The  left ventricle has normal function. The left ventricle has no regional  wall motion abnormalities. Left ventricular diastolic parameters are  consistent with Grade I diastolic  dysfunction (impaired relaxation).   2. Right ventricular systolic function is normal. The right ventricular  size is normal.   3. The mitral valve is normal in structure. Trivial mitral valve  regurgitation. No evidence of mitral stenosis.   4. The aortic valve is normal in structure. Aortic valve regurgitation is  not visualized. Aortic valve sclerosis is present, with no evidence of  aortic valve stenosis.   5. The inferior vena cava is normal in size with greater than 50%  respiratory variability, suggesting right atrial pressure of 3 mmHg.     Neuro/Psych  PSYCHIATRIC DISORDERS Anxiety Depression       GI/Hepatic ,,,(+)     substance abuse  marijuana use  Endo/Other  diabetes, Type 2    Renal/GU Renal disease     Musculoskeletal  (+) Arthritis ,    Abdominal   Peds  Hematology  (+) Blood dyscrasia, anemia On eliquis   Lab Results      Component                Value               Date                      WBC                      7.3                 11/01/2022                 HGB                      10.2 (L)            11/01/2022                HCT                      31.6 (L)            11/01/2022                MCV                      87.8                11/01/2022                PLT  216                 11/01/2022              Anesthesia Other Findings   Reproductive/Obstetrics                             Anesthesia Physical Anesthesia Plan  ASA: 3  Anesthesia Plan: General   Post-op Pain Management: Minimal or no pain anticipated   Induction: Intravenous  PONV Risk Score and Plan: 2 and Treatment may vary due to age or medical condition and Propofol  infusion  Airway Management Planned: Mask  Additional Equipment: None  Intra-op Plan:   Post-operative Plan:   Informed Consent: I have reviewed the patients History and Physical, chart, labs and discussed the procedure including the risks, benefits and alternatives for the proposed anesthesia with the patient or authorized representative who has indicated his/her understanding and acceptance.     Dental advisory given  Plan Discussed with: CRNA  Anesthesia Plan Comments:        Anesthesia Quick Evaluation

## 2023-09-23 NOTE — Transfer of Care (Signed)
 Immediate Anesthesia Transfer of Care Note  Patient: [name]  Procedure(s) Performed: CARDIOVERSION  Patient Location: PACU  Anesthesia Type:MAC  Level of Consciousness: drowsy  Airway & Oxygen Therapy: Patient Spontanous Breathing and Patient connected to nasal cannula oxygen  Post-op Assessment: Report given to RN and Post -op Vital signs reviewed and stable  Post vital signs: Reviewed and stable  Last Vitals:  Vitals Value Taken Time  BP    Temp    Pulse 61 09/23/23 11:29  Resp 17 09/23/23 11:29  SpO2 96 % 09/23/23 11:29  Vitals shown include unfiled device data.  Last Pain:  Vitals:   09/23/23 1120  TempSrc:   PainSc: 0-No pain         Complications: There were no known notable events for this encounter.

## 2023-09-23 NOTE — Anesthesia Postprocedure Evaluation (Signed)
 Anesthesia Post Note  Patient: [name]  Procedure(s) Performed: CARDIOVERSION     Patient location during evaluation: PACU Anesthesia Type: General Level of consciousness: awake and alert Pain management: pain level controlled Vital Signs Assessment: post-procedure vital signs reviewed and stable Respiratory status: spontaneous breathing, nonlabored ventilation and respiratory function stable Cardiovascular status: blood pressure returned to baseline and stable Postop Assessment: no apparent nausea or vomiting Anesthetic complications: no   There were no known notable events for this encounter.  Last Vitals:  Vitals:   09/23/23 1220 09/23/23 1225  BP: (!) 163/93 (!) 189/91  Pulse: (!) 57 64  Resp: (!) 23 20  Temp:    SpO2: 95% 99%    Last Pain:  Vitals:   09/23/23 1120  TempSrc:   PainSc: 0-No pain                 Butler Levander Pinal

## 2023-09-27 ENCOUNTER — Telehealth (HOSPITAL_COMMUNITY): Payer: Self-pay | Admitting: *Deleted

## 2023-09-27 NOTE — Telephone Encounter (Signed)
 Pt called with c/o that the Depakote  increase (or restart) is causing more insomnia. Pt says he is getting minimal sleep of just a couple hours and one night he didn't fall asleep until 0400. Pt says he has never had a sleep study but knows we placed referral to neuro for memory issues. Pt would like advice on Depakote  and insomnia. Pt f/u is scheduled for 11/03/23.

## 2023-09-28 ENCOUNTER — Other Ambulatory Visit (HOSPITAL_COMMUNITY): Payer: Self-pay | Admitting: *Deleted

## 2023-09-28 DIAGNOSIS — R454 Irritability and anger: Secondary | ICD-10-CM

## 2023-09-28 DIAGNOSIS — Z630 Problems in relationship with spouse or partner: Secondary | ICD-10-CM

## 2023-09-28 DIAGNOSIS — F1911 Other psychoactive substance abuse, in remission: Secondary | ICD-10-CM

## 2023-09-28 DIAGNOSIS — F330 Major depressive disorder, recurrent, mild: Secondary | ICD-10-CM

## 2023-09-28 DIAGNOSIS — R413 Other amnesia: Secondary | ICD-10-CM

## 2023-09-28 NOTE — Telephone Encounter (Signed)
 Please refer to neurology and neurocognitive testing and he can reduce the Depakote  to 50 mg twice a day and not to take in the evening.  He will need an appointment for further medication adjustment.

## 2023-09-28 NOTE — Telephone Encounter (Signed)
 As per chart, he is not taking Depakote  as of June 25.  Not clear if he is taking despite he told other provider that he is not taking.  He had significant memory issues and we have referred to see neurology and neurocognitive testing.  If he is taking the Depakote  then he need to spread out 3 times a day about taking 1 in the morning and 2 at bedtime.  If he is not taking Depakote  then we need to have appointment before I try a different medication due to multiple health issues.  He recently had a cardiac procedure and not sure if that has because change in his sleep cycle.

## 2023-09-28 NOTE — Telephone Encounter (Signed)
 I will send referrals to neuro and advise to decrease Depakote  dose. He does have a f/u scheduled for 11/03/23. Thanks.

## 2023-09-28 NOTE — Telephone Encounter (Signed)
 He said he has been taking it. We went over his med list and he specifically stated the Depakote  250 mg. He recently restarted that medication he says. Pt says that he is taking it as written after last visit on 09/15/23 (1 qam and 2 at bedtime). Pt is medically complicated. States he doesn't know that cardioversion on 09/23/23 has made any difference in regards to sleep. He's not sure.

## 2023-09-29 ENCOUNTER — Ambulatory Visit: Admitting: Nurse Practitioner

## 2023-09-29 ENCOUNTER — Encounter: Payer: Self-pay | Admitting: Nurse Practitioner

## 2023-09-29 ENCOUNTER — Telehealth: Payer: Self-pay

## 2023-09-29 VITALS — BP 90/50 | HR 56 | Ht 66.25 in | Wt 175.5 lb

## 2023-09-29 DIAGNOSIS — Z87891 Personal history of nicotine dependence: Secondary | ICD-10-CM | POA: Diagnosis not present

## 2023-09-29 DIAGNOSIS — Z860101 Personal history of adenomatous and serrated colon polyps: Secondary | ICD-10-CM | POA: Diagnosis not present

## 2023-09-29 DIAGNOSIS — Z1211 Encounter for screening for malignant neoplasm of colon: Secondary | ICD-10-CM

## 2023-09-29 DIAGNOSIS — Z862 Personal history of diseases of the blood and blood-forming organs and certain disorders involving the immune mechanism: Secondary | ICD-10-CM | POA: Diagnosis not present

## 2023-09-29 DIAGNOSIS — Z7901 Long term (current) use of anticoagulants: Secondary | ICD-10-CM | POA: Diagnosis not present

## 2023-09-29 DIAGNOSIS — G3184 Mild cognitive impairment, so stated: Secondary | ICD-10-CM

## 2023-09-29 DIAGNOSIS — I4891 Unspecified atrial fibrillation: Secondary | ICD-10-CM | POA: Diagnosis not present

## 2023-09-29 NOTE — Telephone Encounter (Signed)
 [facility] Pre-operative Risk Assessment     Request for surgical clearance:     Endoscopy Procedure  What type of surgery is being performed?     Colonoscopy  When is this surgery scheduled?     TBD  What type of clearance is required ?   Cardiac and Pharmacy clearance prior to scheduling to scheduling colonoscopy due to recent A-fib on 09/23/23  Are there any medications that need to be held prior to surgery and how long? Eliquis  2 days prior   Practice name and name of physician performing surgery?      [facility]  What is your office phone and fax number?      Phone- [phone]  Fax- [phone]  Anesthesia type (None, local, MAC, general) ?       MAC   Please route your response to [facility]

## 2023-09-29 NOTE — Patient Instructions (Signed)
 We will request Cardiac clearance from your cardiologist before scheduling a Colonoscopy.  _______________________________________________________  If your blood pressure at your visit was 140/90 or greater, please contact your primary care physician to follow up on this.  _______________________________________________________  If you are age 68 or older, your body mass index should be between 23-30. Your Body mass index is 28.11 kg/m. If this is out of the aforementioned range listed, please consider follow up with your Primary Care Provider.  If you are age 40 or younger, your body mass index should be between 19-25. Your Body mass index is 28.11 kg/m. If this is out of the aformentioned range listed, please consider follow up with your Primary Care Provider.   ________________________________________________________  The [facility] providers would like to encourage you to use MYCHART to communicate with providers for non-urgent requests or questions.  Due to long hold times on the telephone, sending your provider a message by [facility] may be a faster and more efficient way to get a response.  Please allow 48 business hours for a response.  Please remember that this is for non-urgent requests.  _______________________________________________________   It was a pleasure to see you today!  Thank you for trusting me with your gastrointestinal care!

## 2023-09-29 NOTE — Progress Notes (Addendum)
 09/29/2023 [name] [id number] December 12, 1955   CHIEF COMPLAINT: Schedule a colonoscopy  HISTORY OF PRESENT ILLNESS: [name] is a 68 year old male with a past medical history of anxiety, depression, hypertension, hyperlipidemia, paroxysmal atrial fibrillation s/p cardioversion 09/22/2023, coronary artery disease s/p NSTEMI 2017, PAD, carotid artery disease s/p right endarterectomy 2021 and 2022, peripheral arterial disease, diabetes mellitus type 2, CKD, alcohol use disorder (abstinent x 3 years), chronic marijuana use, remote cocaine use, chronic tobacco use (quit smoking cigarettes 4 months ago), diverticulosis and colon polyps. 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 He presents to our office today as referred by Dr. Leni Edyth Fairly  to schedule colonoscopy.  He is a poor historian and I obtained the majority of his past medical history per records in epic and care everywhere.  He could not recall ever having a colonoscopy.  He denies having any upper or lower abdominal pain.  No nausea or vomiting.  He is passing a normal formed brown bowel movements most days.  He has infrequent loose stools if he takes his medications on an empty stomach.  No bloody or Matheny stools.  No GERD symptoms or dysphagia.  He endorses taking oral iron for several months but recently ran out of his supply and did not have the funds to purchase at this time.   He has a history of Eliquis  and underwent cardioversion 09/23/2023.  He denies having any chest pain, palpitations, dizziness or shortness of breath. His last echocardiogram on 04/29/2021 revealed EF 55 to 60% and ABI in June 2022 revealed moderately decreased perfusion of the bilateral lower extremities with no change of from 2017.        Latest Ref Rng & Units 08/26/2023    8:37 AM 11/01/2022    2:21 PM 09/16/2021    2:46 PM  CBC  WBC 3.4 - 10.8 x10E3/uL 7.7  7.3  8.6   Hemoglobin 13.0 - 17.7 g/dL 85.1  89.7  87.1   Hematocrit 37.5 -  51.0 % 46.7  31.6  38.6   Platelets 150 - 450 x10E3/uL 217  216  197         Latest Ref Rng & Units 08/26/2023    8:37 AM 11/01/2022    2:21 PM 09/16/2021    2:46 PM  CMP  Glucose 70 - 99 mg/dL 79  86  88   BUN 8 - 27 mg/dL 14  23  11    Creatinine 0.76 - 1.27 mg/dL 8.81  8.88  9.03   Sodium 134 - 144 mmol/L 137  138  139   Potassium 3.5 - 5.2 mmol/L 3.9  3.9  3.8   Chloride 96 - 106 mmol/L 98  106  108   CO2 20 - 29 mmol/L 21  23  21    Calcium  8.6 - 10.2 mg/dL 9.1  8.7  8.9   Total Protein 6.0 - 8.5 g/dL 6.7  6.9  6.8   Total Bilirubin 0.0 - 1.2 mg/dL 0.3  0.3  0.4   Alkaline Phos 44 - 121 IU/L 96  86    AST 0 - 40 IU/L 17  19  18    ALT 0 - 44 IU/L 15  16  14      ECHO 04/29/2021: Left ventricular ejection fraction, by estimation, is 55 to 60%. The left ventricle has normal function. The left ventricle has no regional wall motion abnormalities. Left ventricular diastolic parameters are consistent with Grade I diastolic dysfunction (impaired relaxation).  1. 2. Right ventricular systolic function is normal. The right ventricular size is normal. The mitral valve is normal in structure. Trivial mitral valve regurgitation. No evidence of mitral stenosis. 3. The aortic valve is normal in structure. Aortic valve regurgitation is not visualized. Aortic valve sclerosis is present, with no evidence of aortic valve stenosis. 4. The inferior vena cava is normal in size with greater than 50% respiratory variability, suggesting right atrial pressure of 3 mmHg.  PAST GI PROCEDURES:  Colonoscopy 08/30/2022 at [facility]: Impression  Medium hemorrhoids  -End-to-side ileocolonic anastomosis patent  -Poor prep throughout the colon with fibrous debris that clogged the scope  and prevented adequate suctioning of puddles of brown stool   Recommendation  Repeat colonoscopy in 1 year, due: 08/30/2023  Inadequate bowel preparation   With 2 day colon prep; avoid fiber for 3 days prior   Colonoscopy  01/12/2016 as an inpatient secondary to GI bleeding on Brilinta  by Dr. Leigh: - Preparation of the colon was fair, lavage performed, no large mass lesions or polyps noted. - Non-thrombosed external hemorrhoids, and promnent palpable coxxyx found on perianal exam. NO mass lesion. - One 4 mm polyp in the sigmoid colon, removed with a cold biopsy forceps. Resected and retrieved. - Diverticulosis in the ascending colon. - Internal hemorrhoids with suspected fissure and the likely cause of the patient's symptoms - The examined portion of the ileum was normal. -Recommended repeating a colonoscopy 11/2016 when cardiology will allow patient to hold antiplatelet therapy in setting of recent stent placement.  2-day bowel prep with next colonoscopy. - TUBULAR ADENOMA(S). - HIGH GRADE DYSPLASIA IS NOT IDENTIFIED   Past Medical History:  Diagnosis Date   Anxiety    Arthritis    Bilateral carotid artery stenosis    s/p  bilateral CEA  2021 and 2022   Chronic anticoagulation    eliquis --- managed by dr ladona   Chronic gouty arthritis    per pt bilateral knees and great toes   Chronic kidney disease    Renal artery stenosis- had renal stent done in [place]    Coronary artery disease 2007   cardiologsit--- dr ladona;   NSTEMI 12-18-2015  s/p cath w/ PCI balloon angioplasty to dPDA/  DES overlap to prox and mid LAD   Depression    DOE (dyspnea on exertion)    08-05-2022  per pt sob w/ stairs   Emphysema lung (HCC)    Family history of premature CAD    Full dentures    History of bowel resection 04/19/2021   admission in epic ;  recent admission for DKA, 04-16-2021 readmittd 04-17-2021 abdominal pain , dx embolic mesenteric ischemic bowel  s/p ileocecectomy   History of diabetic ketoacidosis 04/16/2021   admission in epic , dx DM2   History of illicit drug use    History of non-ST elevation myocardial infarction (NSTEMI) 12/18/2015   History of transient ischemic attack (TIA)    08-05-2022  per pt  remote hx, unsure when;   per imaging in epic/ care everywhere MRI  11-20-2020  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 (pons, basal ganglia, periventrcular white matter of cerebellar)   Hyperlipidemia    Hypertension    Ischemic segment of 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-s/p exploratory laparotomy on 1/22 04/29/2021   PAD (peripheral artery disease) (HCC)    s/p  vascular intervention in [place]  , pt unsure what was done but w/ cardiac cath 12-18-2015 aortagram w/ lower extremities was done showed  right renal artery stent   PAF (paroxysmal atrial fibrillation) (HCC)    followed by ganji;   first started post op carotid surgery,  takes eliquis    S/P drug eluting coronary stent placement 12/18/2015   to prox and mid LAD,  balloon angioplasty to distal PDA   Short-term memory loss    08-05-2022  pt stated smoking majiuana helps with his memory   Type 2 diabetes mellitus treated with insulin  (HCC)    followed by pcp   (08-05-2022  per pt checks blood sugar twice dialy,  fasting average 98-158)   Past Surgical History:  Procedure Laterality Date   CARDIAC CATHETERIZATION N/A 12/18/2015   Procedure: Left Heart Cath and Coronary Angiography;  Surgeon: Gordy Bergamo, MD;  Location[facility] INVASIVE CV LAB;  Service: Cardiovascular;  Laterality: N/A;   CARDIAC CATHETERIZATION N/A 12/18/2015   Procedure: Coronary Stent Intervention;  Surgeon: Gordy Bergamo, MD;  Location: [facility] INVASIVE CV LAB;  Service: Cardiovascular;  Laterality: N/A;   CARDIAC CATHETERIZATION N/A 12/18/2015   Procedure: Coronary Balloon Angioplasty;  Surgeon: Gordy Bergamo, MD;  Location: [facility] INVASIVE CV LAB;  Service: Cardiovascular;  Laterality: N/A;   CARDIAC CATHETERIZATION  01/17/2006   @[facility]  by dr bensimhon;   for chest pain/ palpitations/ +cocaine;    mild nonobstructive CAD   CARDIAC CATHETERIZATION  05/03/2008   @[facility]  by dr cherrie;  for severe CP/ severely elevated BP;  nonobstructive CAD  w/ normal LVF,  ? abuse withdrawal   CARDIOVERSION N/A 09/23/2023   Procedure: CARDIOVERSION;  Surgeon: Kate Lonni CROME, MD;  Location: [facility] INVASIVE CV LAB;  Service: Cardiovascular;  Laterality: N/A;   COLONOSCOPY N/A 01/12/2016   Procedure: COLONOSCOPY;  Surgeon: Elspeth Deward Naval, MD;  Location: [facility] ENDOSCOPY;  Service: Gastroenterology;  Laterality: N/A;   COLONOSCOPY WITH PROPOFOL   08/30/2022   [email or web address] by dr shaunna. [email or web address]   ENDARTERECTOMY Left 03/04/2020   Procedure: LEFT ENDARTERECTOMY CAROTID;  Surgeon: Oris Krystal FALCON, MD;  Location: [facility] OR;  Service: Vascular;  Laterality: Left;   ENDARTERECTOMY Right 04/22/2020   Procedure: RIGHT CAROTID ENDARTERECTOMY;  Surgeon: Oris Krystal FALCON, MD;  Location: [facility] OR;  Service: Vascular;  Laterality: Right;   INGUINAL HERNIA REPAIR Right 11/10/2022   Procedure: LAPAROSCOPIC RIGHT INGUINAL HERNIA REPAIR WITH MESH;  Surgeon: Lyndel Deward PARAS, MD;  Location: [facility] ORS;  Service: General;  Laterality: Right;   LAPAROSCOPY N/A 04/19/2021   Procedure: LAPAROSCOPY DIAGNOSTIC;  Surgeon: Lyndel Deward PARAS, MD;  Location: [facility] OR;  Service: General;  Laterality: N/A;   LAPAROTOMY N/A 04/19/2021   Procedure: EXPLORATORY LAPAROTOMY, ILEOCECECTOMY;  Surgeon: Lyndel Deward PARAS, MD;  Location: [facility] OR;  Service: General;  Laterality: N/A;   PATCH ANGIOPLASTY Left 03/04/2020   Procedure: PATCH ANGIOPLASTY;  Surgeon: Oris Krystal FALCON, MD;  Location: [facility] OR;  Service: Vascular;  Laterality: Left;   PERIPHERAL VASCULAR CATHETERIZATION N/A 12/18/2015   Procedure: Abdominal Aortogram;  Surgeon: Gordy Bergamo, MD;  Location: [facility] INVASIVE CV LAB;  Service: Cardiovascular;  Laterality: N/A;   PERIPHERAL VASCULAR INTERVENTION     in [place]    (per pt unsure what was done or when)   UMBILICAL HERNIA REPAIR     age 37s   Social History: He is married.  He has 2 sons and 1 daughter.  Past smoker, quit smoking cigarettes 4 months ago.  Prior  history of alcohol use disorder since age 63, abstinent x 3 years.  He smokes marijuana daily.  Remote crack cocaine  use.  Family History: No known family history of esophageal, gastric or colon cancer.  Father and mother with heart disease/MI.  Allergies  Allergen Reactions   Codeine Nausea And Vomiting      Outpatient Encounter Medications as of 09/29/2023  Medication Sig   albuterol (VENTOLIN HFA) 108 (90 Base) MCG/ACT inhaler Inhale 2 puffs into the lungs every 6 (six) hours as needed for wheezing or shortness of breath.   ALPRAZolam (XANAX) 0.5 MG tablet Take 0.5 mg by mouth at bedtime.   amLODipine  (NORVASC ) 10 MG tablet Take 10 mg by mouth daily.   colchicine  0.6 MG tablet Take 0.6 mg by mouth daily as needed (gout flares).   ELIQUIS  5 MG TABS tablet TAKE 1 TABLET(5 MG) BY MOUTH TWICE DAILY   escitalopram  (LEXAPRO ) 20 MG tablet Take 20 mg by mouth daily.   ferrous sulfate 325 (65 FE) MG EC tablet Take 325 mg by mouth daily.   insulin  glargine (LANTUS ) 100 UNIT/ML Solostar Pen Inject 22 Units into the skin daily. (Patient taking differently: Inject 10 Units into the skin daily.)   Insulin  Pen Needle 32G X 4 MM MISC 1 each by Does not apply route as needed.   losartan -hydrochlorothiazide  (HYZAAR) 100-12.5 MG tablet Take 1 tablet by mouth daily.   metFORMIN  (GLUCOPHAGE ) 500 MG tablet Take 1 tablet (500 mg total) by mouth 2 (two) times daily with a meal. (Patient taking differently: Take 250 mg by mouth 2 (two) times daily with a meal.)   metoprolol  succinate (TOPROL -XL) 50 MG 24 hr tablet Take 1 tablet (50 mg total) by mouth daily. Take with or immediately following a meal.   Multiple Vitamins-Minerals (MULTIVITAMIN WITH MINERALS) tablet Take 1 tablet by mouth daily.   nitroGLYCERIN  (NITROSTAT ) 0.4 MG SL tablet Place 1 tablet (0.4 mg total) under the tongue every 5 (five) minutes as needed. For chest pain   VASCEPA  1 g capsule TAKE 2 CAPSULES(2 GRAMS) BY MOUTH TWICE DAILY   No  facility-administered encounter medications on file as of 09/29/2023.   REVIEW OF SYSTEMS:  Gen: + Fatigue. Denies fever, sweats or chills. No weight loss.  CV: Denies chest pain, palpitations or edema. Resp: Denies cough, shortness of breath of hemoptysis.  GI: See HPI. GU:+ Urine leakage.  MS: + Arthritis.  Derm: Denies rash, itchiness, skin lesions or unhealing ulcers. Psych:+ Depression. Heme: Denies bruising, easy bleeding. Neuro:  Denies headaches, dizziness or paresthesias. Endo: + Dm type II.  PHYSICAL EXAM: BP (!) 90/50 (BP Location: Left Arm, Patient Position: Sitting, Cuff Size: Normal)   Pulse (!) 56   Ht 5' 6.25 (1.683 m) Comment: height measured without shoes  Wt 175 lb 8 oz (79.6 kg)   BMI 28.11 kg/m   General: 68 year old male in no acute distress. Head: Normocephalic and atraumatic. Eyes:  Sclerae non-icteric, conjunctive pink. Ears: Normal auditory acuity. Mouth: Absent dentition. No ulcers or lesions.  Neck: Supple, no lymphadenopathy or thyromegaly.  Lungs: Clear bilaterally to auscultation without wheezes, crackles or rhonchi. Heart: Regular rate and rhythm. No murmur, rub or gallop appreciated.  Abdomen: Soft, nontender, nondistended. No masses. No hepatosplenomegaly. Normoactive bowel sounds x 4 quadrants.  Rectal: Deferred. Musculoskeletal: Symmetrical with no gross deformities. Skin: Warm and dry. No rash or lesions on visible extremities. Extremities: No edema. Neurological: Alert oriented x 4, no focal deficits.  Psychological: Alert and cooperative. Normal mood and affect.  ASSESSMENT AND PLAN:  68 year old male with a history of colon polyps presents to schedule a colonoscopy.  Colonoscopy 12/2015 identified 1 tubular adenomatous polyp removed from the colon.  Colonoscopy at [facility] 08/2022 resulted in a poor prep, repeat colonoscopy in 1 year with 2-day bowel prep recommended.  No known family history of colon polyps or colorectal cancer. -  Patient is status post cardioversion for A-fib on 09/23/2023 on Eliquis  therefore colonoscopy deferred for now.  Our office will contact the patient's cardiologist to verify appropriate timing to pursue a colonoscopy and to request official cardiac clearance and Eliquis  hold instructions prior to scheduling a colonoscopy -Colonoscopy benefits and risks discussed including risk with sedation, risk of bleeding, perforation and infection   Past anemia, previously on ferrous sulfate daily, patient stopped taking it 1 month ago.  Hemoglobin 14.8 on 08/26/2023. 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 Given past medical history and time since last visit, based on ACC/AHA guidelines, [name] would be at acceptable risk for the planned procedure without further cardiovascular testing.    Due to history of CVA, recommend holding Eliquis  for 1 day prior to procedure.    I will route this recommendation to the requesting party via Epic fax function and remove from pre-op pool.   Please call with questions.   Evan HERO. Cleaver NP-C     CC:  Maree Leni Edyth DELENA, MD

## 2023-10-02 NOTE — Progress Notes (Signed)
 Agree with assessment and plan as outlined.

## 2023-10-04 ENCOUNTER — Telehealth (HOSPITAL_COMMUNITY): Payer: Self-pay | Admitting: *Deleted

## 2023-10-04 NOTE — Telephone Encounter (Signed)
 Referrals for neuro cognitive testing sent to [facility], [facility], and [facility] (Dr.Metz or Rodenbough) on 10/03/23. [facility] declined.

## 2023-10-05 NOTE — Telephone Encounter (Signed)
 Patient with diagnosis of A Fib on Eliquis  for anticoagulation.  DCCV on 09/23/23  Procedure: colonoscopy Date of procedure: TBD   CHA2DS2-VASc Score = 6  This indicates a 9.7% annual risk of stroke. The patient's score is based upon: CHF History: 0 HTN History: 1 Diabetes History: 1 Stroke History: 2 Vascular Disease History: 1 Age Score: 1 Gender Score: 0    CrCl 67 Platelet count 217K  Patient has had a DCCV within last 30 days  Due to history of CVA, recommend holding Eliquis  for 1 day prior to procedure. Procedure should be scheduled after 10/23/23   **This guidance is not considered finalized until pre-operative APP has relayed final recommendations.**

## 2023-10-20 ENCOUNTER — Ambulatory Visit (INDEPENDENT_AMBULATORY_CARE_PROVIDER_SITE_OTHER): Admitting: Licensed Clinical Social Worker

## 2023-10-20 DIAGNOSIS — F4310 Post-traumatic stress disorder, unspecified: Secondary | ICD-10-CM | POA: Diagnosis not present

## 2023-10-20 DIAGNOSIS — F331 Major depressive disorder, recurrent, moderate: Secondary | ICD-10-CM

## 2023-10-20 DIAGNOSIS — F411 Generalized anxiety disorder: Secondary | ICD-10-CM
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
## 2023-10-21 ENCOUNTER — Other Ambulatory Visit (HOSPITAL_COMMUNITY): Payer: Self-pay | Admitting: Psychiatry

## 2023-10-25 NOTE — Telephone Encounter (Signed)
 Jan, the below message provides Eliquis  hold instructions but does not include official cardiac clearance.  Refer to office visit 09/29/2023, cardiac clearance to also include Eliquis  hold instructions was requested at that time. It is good to know we have Eliquis  hold x 1 day instruction but we do not yet have cardiac clearance. Patient is scheduled to see his cardiologist 10/27/2023. Please do not schedule patient for endoscopic procedure until official cardiac clearance received. THX.

## 2023-10-25 NOTE — Telephone Encounter (Signed)
 Called patient and LM to call back so we can schedule him for colonoscopy with Dr. Leigh.  He has been cleared to hold Eliquis  for 1 day per Cardiology.

## 2023-10-27 ENCOUNTER — Ambulatory Visit (HOSPITAL_COMMUNITY): Admitting: Licensed Clinical Social Worker

## 2023-10-27 ENCOUNTER — Ambulatory Visit: Attending: Cardiology | Admitting: Cardiology

## 2023-10-27 ENCOUNTER — Other Ambulatory Visit (HOSPITAL_COMMUNITY): Payer: Self-pay

## 2023-10-27 ENCOUNTER — Encounter: Payer: Self-pay | Admitting: Cardiology

## 2023-10-27 VITALS — BP 147/69 | HR 55 | Resp 16 | Ht 66.0 in | Wt 176.0 lb

## 2023-10-27 DIAGNOSIS — E7800 Pure hypercholesterolemia, unspecified: Secondary | ICD-10-CM | POA: Diagnosis not present

## 2023-10-27 DIAGNOSIS — I480 Paroxysmal atrial fibrillation: Secondary | ICD-10-CM

## 2023-10-27 DIAGNOSIS — I10 Essential (primary) hypertension: Secondary | ICD-10-CM | POA: Diagnosis not present

## 2023-10-27 DIAGNOSIS — I2510 Atherosclerotic heart disease of native coronary artery without angina pectoris: Secondary | ICD-10-CM

## 2023-10-27 MED ORDER — HYDRALAZINE HCL 50 MG PO TABS
50.0000 mg | ORAL_TABLET | Freq: Three times a day (TID) | ORAL | 3 refills | Status: DC
  Filled 2023-10-27: qty 270, 90d supply, fill #0

## 2023-10-27 MED ORDER — ATORVASTATIN CALCIUM 10 MG PO TABS
5.0000 mg | ORAL_TABLET | Freq: Every day | ORAL | 3 refills | Status: AC
  Filled 2023-10-27: qty 45, 90d supply, fill #0

## 2023-10-27 NOTE — Patient Instructions (Addendum)
 Medication Instructions:  Your physician has recommended you make the following change in your medication:  Start hydralazine  50 mg three times daily  Start Atorvastatin  10 mg --half tablet by mouth daily   *If you need a refill on your cardiac medications before your next appointment, please call your pharmacy*  Lab Work: none If you have labs (blood work) drawn today and your tests are completely normal, you will receive your results only by: MyChart Message (if you have MyChart) OR A paper copy in the mail If you have any lab test that is abnormal or we need to change your treatment, we will call you to review the results.  Testing/Procedures: none  Follow-Up: At [facility], you and your health needs are our priority.  As part of our continuing mission to provide you with exceptional heart care, our providers are all part of one team.  This team includes your primary Cardiologist (physician) and Advanced Practice Providers or APPs (Physician Assistants and Nurse Practitioners) who all work together to provide you with the care you need, when you need it.  Your next appointment:   6 month(s)  Provider:   Gordy Bergamo, MD    We recommend signing up for the patient portal called MyChart.  Sign up information is provided on this After Visit Summary.  MyChart is used to connect with patients for Virtual Visits (Telemedicine).  Patients are able to view lab/test results, encounter notes, upcoming appointments, etc.  Non-urgent messages can be sent to your provider as well.   To learn more about what you can do with MyChart, go to [email or web address].   Other Instructions

## 2023-10-27 NOTE — Progress Notes (Signed)
 Cardiology Office Note:  .   Date:  10/27/2023  ID:  [name], DOB Apr 20, 1955, MRN [id number] PCP: Evan Leni Edyth DELENA, MD  [facility] Providers Cardiologist:  Evan Bergamo, MD   History of Present Illness: .   [name] is a 68 y.o. 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  Presents here for follow-up after cardioversion on 09/23/2023 and was successfully cardioverted with 200 J x 1.  Metoprolol  dose was decreased to 50 mg daily due to low heart rate.  He remains asymptomatic.  Carotid ultrasound in 11/15/2021 revealed patent stents bilaterally ABI 08/2020 revealing moderate decrease in right and left lower extremity ABI at 0.69 and 0.72 respectively, no change from 2020.   Echocardiogram on 04/29/2021 revealing normal LVEF without significant valvular abnormality.  Discussed the use of AI scribe software for clinical note transcription with the patient, who gave verbal consent to proceed.  History of Present Illness [name] is a 68 year old male with atrial fibrillation, hypertension, and coronary artery disease who presents for cardiovascular follow-up. He has paroxysmal atrial fibrillation and underwent cardioversion on September 23, 2023. He is currently on Eliquis .  Hypertension is managed with hydralazine  50 mg three times daily, losartan , amlodipine  10 mg daily, losartan  HCT 100/12.5 mg daily, and  metoprolol  succinate 50 mg daily. Home blood pressure readings range from 120 to 160 systolic, with monitoring twice daily.  Coronary artery disease is managed with losartan , amlodipine , and metoprolol . He reports no recent chest pain.  He quit smoking four months ago but is exposed to secondhand smoke at home. He occasionally vapes but has not used it today. [place] for about 20 minutes in the evenings without leg cramps or other issues.  Labs   Lab Results  Component Value Date   CHOL 91 (L) 08/26/2023   HDL 42 08/26/2023   LDLCALC 35 08/26/2023   LDLDIRECT 27 07/11/2019   TRIG 62 08/26/2023   CHOLHDL 2.2 08/26/2023   Lipoprotein (a)  Date/Time Value Ref Range Status  07/11/2019 08:11 AM 15.1 <75.0 nmol/L Final    Comment:    Note:  Values greater than or equal to 75.0 nmol/L may        indicate an independent risk factor for CHD,        but must be evaluated with caution when applied        to non-Caucasian populations due to the        influence of genetic factors on Lp(a) across        ethnicities.     Lab Results  Component Value Date   NA 137 08/26/2023   K 3.9 08/26/2023   CO2 21 08/26/2023   GLUCOSE 79 08/26/2023   BUN 14 08/26/2023   CREATININE 1.18 08/26/2023   CALCIUM  9.1 08/26/2023   EGFR 67 08/26/2023   GFRNONAA >60 11/01/2022      Latest  Ref Rng & Units 08/26/2023    8:37 AM 11/01/2022    2:21 PM 09/16/2021    2:46 PM  BMP  Glucose 70 - 99 mg/dL 79  86  88   BUN 8 - 27 mg/dL 14  23  11    Creatinine 0.76 - 1.27 mg/dL 8.81  8.88  9.03   BUN/Creat Ratio 10 - 24 12   NOT APPLICABLE   Sodium 134 - 144 mmol/L 137  138  139   Potassium 3.5 - 5.2 mmol/L 3.9  3.9  3.8   Chloride 96 - 106 mmol/L 98  106  108   CO2 20 - 29 mmol/L 21  23  21    Calcium  8.6 - 10.2 mg/dL 9.1  8.7  8.9       Latest Ref Rng & Units 08/26/2023    8:37 AM 11/01/2022    2:21 PM 09/16/2021    2:46 PM  CBC  WBC 3.4 - 10.8 x10E3/uL 7.7  7.3  8.6   Hemoglobin  13.0 - 17.7 g/dL 85.1  89.7  87.1   Hematocrit 37.5 - 51.0 % 46.7  31.6  38.6   Platelets 150 - 450 x10E3/uL 217  216  197    Lab Results  Component Value Date   HGBA1C 6.2 (H) 11/01/2022    Lab Results  Component Value Date   TSH 1.72 09/16/2021    ROS  Review of Systems  Cardiovascular:  Negative for chest pain, claudication, dyspnea on exertion and leg swelling.   Physical Exam:   VS:  BP (!) 147/69 (BP Location: Left Arm, Patient Position: Sitting, Cuff Size: Normal)   Pulse (!) 55   Resp 16   Ht 5' 6 (1.676 m)   Wt 176 lb (79.8 kg)   SpO2 97%   BMI 28.41 kg/m    Wt Readings from Last 3 Encounters:  10/27/23 176 lb (79.8 kg)  09/29/23 175 lb 8 oz (79.6 kg)  09/20/23 177 lb 3.2 oz (80.4 kg)    Physical Exam Neck:     Vascular: No carotid bruit or JVD.  Cardiovascular:     Rate and Rhythm: Normal rate. Rhythm irregular.     Pulses: Intact distal pulses.          Dorsalis pedis pulses are 1+ on the right side and 1+ on the left side.       Posterior tibial pulses are 0 on the right side and 0 on the left side.     Heart sounds: No murmur heard. Pulmonary:     Effort: Pulmonary effort is normal.     Breath sounds: Normal breath sounds.  Abdominal:     General: Bowel sounds are normal.     Palpations: Abdomen is soft.  Musculoskeletal:     Right lower leg: No edema.     Left lower leg: No edema.  Skin:    Capillary Refill: Capillary refill takes less than 2 seconds.    Studies Reviewed: SABRA    Left Heart Catheterization 12/18/2015:    12/18/2015: proximal and mid LAD with 2 overlapping 4.0 x 22 and 3.5 x 12 mm resolute integrity DES    EKG:    EKG Interpretation Date/Time:  Thursday October 27 2023 09:04:33 EDT Ventricular Rate:  56 PR Interval:  192 QRS Duration:  88 QT Interval:  464 QTC Calculation: 447 R Axis:   -15  Text Interpretation: EKG 10/27/2023: Normal sinus rhythm at rate of 56 bpm, normal EKG.  Compared to 09/23/2023, heart rate is 49 bpm  with PACs no longer present. Confirmed by [name], [name] ([id number]) on 10/27/2023 9:28:49 AM    Medications ordered    Meds ordered this encounter  Medications   hydrALAZINE  (APRESOLINE ) 50 MG tablet    Sig: Take 1 tablet (50 mg total) by mouth 3 (three) times daily.    Dispense:  270 tablet    Refill:  3   atorvastatin  (LIPITOR ) 10 MG tablet    Sig: Take 1/2 tablet (5 mg total) by mouth daily.    Dispense:  45 tablet    Refill:  3     ASSESSMENT AND PLAN: .      ICD-10-CM   1. Paroxysmal atrial fibrillation (HCC)  I48.0 EKG 12-Lead    2. Primary hypertension  I10 hydrALAZINE  (APRESOLINE ) 50 MG tablet    3. Atherosclerosis of native coronary artery of native heart without angina pectoris  I25.10     4. Hypercholesteremia  E78.00 atorvastatin  (LIPITOR ) 10 MG tablet     Assessment & Plan Paroxysmal atrial fibrillation, status post cardioversion Paroxysmal atrial fibrillation, status post successful cardioversion on September 23, 2023. EKG is currently normal. Continued risk of stroke due to atrial fibrillation. - Continue Eliquis  for stroke prevention.  Hypertension Hypertension with home blood pressure readings ranging from 120 to 160 mmHg systolic. Current office blood pressure is 147/69 mmHg. Blood pressure goal is less than 130/80 mmHg, especially important due to atrial fibrillation and coronary artery disease. - Prescribe hydralazine  50 mg three times a day. - Instruct to reduce hydralazine  to twice a day if blood pressure drops too low or if dizziness occurs.  Coronary artery disease Coronary artery disease with no current chest pain. On a regimen of losartan , amlodipine , losartan /HCT, and metoprolol  succinate. Previously on atorvastatin , which was discontinued without clear reason. - Restart atorvastatin  at 5 mg daily. - Continue current medications: losartan , amlodipine , losartan /HCT, and metoprolol  succinate.  Tobacco use, currently vaping, former smoker Former smoker,  currently using vape. Exposed to secondhand smoke at home due to wife's smoking. Advised on the cardiovascular risks associated with vaping and secondhand smoke. - Advise to quit vaping. - Encourage discussion with wife about smoking outside to reduce secondhand smoke exposure.  OV Signed,  Evan Bergamo, MD, [facility] 10/27/2023, 7:55 PM [facility] [address] [place], [place] [address] Phone: [phone]. Fax:  [phone]

## 2023-10-31 NOTE — Telephone Encounter (Signed)
 [facility] Pre-operative Risk Assessment     Request for surgical clearance:     Endoscopy Procedure  What type of surgery is being performed?     Colonoscopy  When is this surgery scheduled?     TBD  What type of clearance is required ?   CARDIAC CLEARANCE prior to scheduling  colonoscopy   Are there any medications that need to be held prior to surgery and how long?  Eliquis  2 days  Practice name and name of physician performing surgery?  Dr. GORMAN [facility]  What is your office phone and fax number?      Phone- [phone]  Fax- [phone]  Anesthesia type (None, local, MAC, general) ?       MAC     Please route your response to [facility], CMA

## 2023-10-31 NOTE — Telephone Encounter (Signed)
     Primary Cardiologist: Gordy Bergamo, MD  Chart reviewed as part of pre-operative protocol coverage. Given past medical history and time since last visit, based on ACC/AHA guidelines, [name] would be at acceptable risk for the planned procedure without further cardiovascular testing.   Due to history of CVA, recommend holding Eliquis  for 1 day prior to procedure.   I will route this recommendation to the requesting party via Epic fax function and remove from pre-op pool.  Please call with questions.  [name]. [name] NP-C     10/31/2023, 4:02 PM [facility] [address] Office [phone] Fax [phone]

## 2023-10-31 NOTE — Telephone Encounter (Signed)
 Colleen,  for your review- Patient has been cleared for procedure by Cardiology and a 1 day hold of Eliquis .

## 2023-11-01 ENCOUNTER — Ambulatory Visit (HOSPITAL_COMMUNITY): Admitting: Psychiatry

## 2023-11-01 NOTE — Telephone Encounter (Signed)
 Madison, Ok to schedule patient for a colonoscopy in [facility] with Dr. Leigh. Patient needs a 2 day bowel prep. Hold Eliquis  x 1 day. THX.

## 2023-11-02 NOTE — Telephone Encounter (Signed)
 Called and asked patient to call back so we can schedule him for colonoscopy with Dr. Leigh in the [facility]

## 2023-11-02 NOTE — Telephone Encounter (Signed)
 For this patient with a history of polyps and anticipated that he will have polyps that warrant removal, it is okay for this patient to hold Eliquis  for 2 days prior to the exam to reduce bleeding risk (rather than 1 day). Thanks

## 2023-11-03 ENCOUNTER — Ambulatory Visit (HOSPITAL_COMMUNITY): Admitting: Psychiatry

## 2023-11-03 ENCOUNTER — Other Ambulatory Visit (HOSPITAL_COMMUNITY): Payer: Self-pay | Admitting: Psychiatry

## 2023-11-03 ENCOUNTER — Ambulatory Visit (INDEPENDENT_AMBULATORY_CARE_PROVIDER_SITE_OTHER): Admitting: Licensed Clinical Social Worker

## 2023-11-03 DIAGNOSIS — F4310 Post-traumatic stress disorder, unspecified: Secondary | ICD-10-CM | POA: Diagnosis not present

## 2023-11-03 DIAGNOSIS — F411 Generalized anxiety disorder: Secondary | ICD-10-CM | POA: Diagnosis not present

## 2023-11-03 DIAGNOSIS — F331 Major depressive disorder, recurrent, moderate: Secondary | ICD-10-CM | POA: Diagnosis not present
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
## 2023-11-04 DIAGNOSIS — E559 Vitamin D deficiency, unspecified: Secondary | ICD-10-CM | POA: Diagnosis not present

## 2023-11-04 DIAGNOSIS — I480 Paroxysmal atrial fibrillation: Secondary | ICD-10-CM | POA: Diagnosis not present

## 2023-11-04 DIAGNOSIS — M109 Gout, unspecified: Secondary | ICD-10-CM | POA: Diagnosis not present

## 2023-11-04 DIAGNOSIS — I110 Hypertensive heart disease with heart failure: Secondary | ICD-10-CM | POA: Diagnosis not present

## 2023-11-04 DIAGNOSIS — Z139 Encounter for screening, unspecified: Secondary | ICD-10-CM | POA: Diagnosis not present

## 2023-11-04 DIAGNOSIS — Z79899 Other long term (current) drug therapy: Secondary | ICD-10-CM | POA: Diagnosis not present

## 2023-11-04 DIAGNOSIS — G479 Sleep disorder, unspecified: Secondary | ICD-10-CM | POA: Diagnosis not present

## 2023-11-04 DIAGNOSIS — E1159 Type 2 diabetes mellitus with other circulatory complications: Secondary | ICD-10-CM | POA: Diagnosis not present

## 2023-11-04 DIAGNOSIS — E785 Hyperlipidemia, unspecified: Secondary | ICD-10-CM | POA: Diagnosis not present

## 2023-11-04 DIAGNOSIS — Z0000 Encounter for general adult medical examination without abnormal findings: Secondary | ICD-10-CM | POA: Diagnosis not present

## 2023-11-07 ENCOUNTER — Other Ambulatory Visit (HOSPITAL_COMMUNITY): Payer: Self-pay | Admitting: Psychiatry

## 2023-11-08 NOTE — Telephone Encounter (Signed)
 Patient returning call. Please advise

## 2023-11-08 NOTE — Telephone Encounter (Signed)
 Please see request earlier in this thread from Dr. Leigh for clearance for patient to hold Eliquis  for 2 days vs 1:   For this patient with a history of polyps and anticipated that he will have polyps that warrant removal, it is okay for this patient to hold Eliquis  for 2 days prior to the exam to reduce bleeding risk (rather than 1 day). Thanks.  Please advise. Thank you.

## 2023-11-08 NOTE — Telephone Encounter (Signed)
 Called and spoke to patient.  Scheduled him for colonoscopy on 9-9 and PV on 8-26. He knows to come to the 2nd floor of our building  for PV.

## 2023-11-10 ENCOUNTER — Ambulatory Visit (INDEPENDENT_AMBULATORY_CARE_PROVIDER_SITE_OTHER): Admitting: Licensed Clinical Social Worker

## 2023-11-10 ENCOUNTER — Other Ambulatory Visit (HOSPITAL_COMMUNITY): Payer: Self-pay | Admitting: *Deleted

## 2023-11-10 ENCOUNTER — Other Ambulatory Visit (HOSPITAL_COMMUNITY): Payer: Self-pay | Admitting: Psychiatry

## 2023-11-10 DIAGNOSIS — F411 Generalized anxiety disorder: Secondary | ICD-10-CM | POA: Diagnosis not present

## 2023-11-10 DIAGNOSIS — F4310 Post-traumatic stress disorder, unspecified: Secondary | ICD-10-CM

## 2023-11-10 DIAGNOSIS — F331 Major depressive disorder, recurrent, moderate: Secondary | ICD-10-CM | POA: Diagnosis not present

## 2023-11-10 NOTE — Progress Notes (Signed)
 THERAPIST PROGRESS NOTE   Session Time:  1:00pm - 2:00pm      Location:  Patient: [facility] Clinician: [facility]   Participation Level: Active   Behavioral Response: Alert, casually dressed, depressed mood/affect       Type of Therapy:  Individual Therapy   Treatment Goals addressed: Mood regulation; Medication compliance; Improving communication skills and setting boundaries   Progress Towards Goals: Progressing   Interventions: CBT: self-care assessment    Summary: [name] is a 68 year old married Caucasian male that presented for therapy session today with diagnosis of Major Depressive Disorder, recurrent episode, moderate; Generalized Anxiety Disorder; and PTSD.      Suicidal/Homicidal: None; without plan or intent    Therapist Response: Clinician met with [name] for in-person therapy session and assessed for safety, and sobriety. [name] presented on time for today's appointment and was alert, oriented x5, with no evidence or self-report of active SI/HI or A/V H.  [name] denied use of alcohol or illicit substances. [name] reported compliance with medication.  Clinician inquired about [name]'s current emotional ratings, as well as any significant changes in thoughts, feelings, or behavior since previous check-in.  [name] reported scores of 6/10 for depression, 6/10 for anxiety, and 0/10 for anger/irritability.  He denied any recent panic attacks or outbursts.  [name] reported that a recent success was having serious conversations with his family, which has led to reduced stress in the household.  He reported that he is now trying to focus more on his own needs and improve self-care.  Clinician introduced topic of self-care today.  Clinician explained how this can be defined as the things one does to maintain good health and improve well-being.  Clinician provided [name] with a self-care assessment form to complete.  This handout featured various sub-categories of self-care,  including physical, psychological/emotional, social, spiritual, and professional.  [name] was asked to rank his engagement in the activities listed for each dimension on a scale of 1-3, with 1 indicating 'Poor', 2 indicating 'Ok', and 3 indicating 'Well'.  Clinician invited [name] to share results of his assessment, and inquired about which areas of self-care he is doing well in, as well as areas that require attention, and how he plans to begin addressing this during treatment.  Intervention was effective, as evidenced by [name] successfully completing initial (physical) section of assessment and actively engaging in discussion on subject, reporting that he is excelling in areas such as eating healthy foods, taking care of personal hygiene, attending preventative medical appointments, and eating regularly, but would benefit from focusing more on areas such as exercise, getting enough sleep, and participating in fun activities.  [name] reported that he would work to improve self-care deficits by getting into a light exercise routine each week that doesn't require too much motivation, talking to his doctor at next appointment about ongoing sleep issues and possibility of a sleep study, and getting back into landscaping around the yard to stay busy during the day.  Clinician will continue to monitor.         Plan: Follow up again in 1 week.    Diagnosis: Major Depressive Disorder, recurrent episode, moderate; Generalized Anxiety Disorder; and PTSD.  Collaboration of Care:   No collaboration of care required for this visit.  Patient/Guardian was advised Release of Information must be obtained prior to any record release in order to collaborate their care with an outside provider. Patient/Guardian was advised if they have not already done so to contact the registration department to sign all necessary forms in order for us  to release information regarding their care.     Consent: Patient/Guardian gives verbal consent for treatment and assignment of benefits for services provided during this visit. Patient/Guardian expressed understanding and agreed to proceed.   Darleene Ricker, [place], LCAS  11/10/23

## 2023-11-16 NOTE — Telephone Encounter (Signed)
   Patient Name: [name]  DOB: [birth year] MRN: [id number]  Primary Cardiologist: Gordy Bergamo, MD  Chart reviewed as part of pre-operative protocol coverage. Given past medical history and time since last visit, based on ACC/AHA guidelines, [name] is at acceptable risk for the planned procedure without further cardiovascular testing.   Due to history of CVA, recommend holding Eliquis  for 1 day prior to procedure.    I will route this recommendation to the requesting party via Epic fax function and remove from pre-op pool.   Please call with questions.   [name]. Cleaver NP-C       10/31/2023, 4:02 PM [facility] [address] Office [phone] Fax [phone]  The patient was advised that if he develops new symptoms prior to surgery to contact our office to arrange for a follow-up visit, and he verbalized understanding.  I will route this recommendation to the requesting party via Epic fax function and remove from pre-op pool.  Please call with questions.  Lamarr Satterfield, NP 11/16/2023, 3:34 PM

## 2023-11-17 ENCOUNTER — Encounter (HOSPITAL_COMMUNITY): Payer: Self-pay | Admitting: Psychiatry

## 2023-11-17 ENCOUNTER — Ambulatory Visit (HOSPITAL_BASED_OUTPATIENT_CLINIC_OR_DEPARTMENT_OTHER): Admitting: Psychiatry

## 2023-11-17 VITALS — BP 119/67 | HR 56 | Ht 66.0 in | Wt 179.0 lb

## 2023-11-17 DIAGNOSIS — R413 Other amnesia: Secondary | ICD-10-CM | POA: Diagnosis not present

## 2023-11-17 DIAGNOSIS — F411 Generalized anxiety disorder: Secondary | ICD-10-CM | POA: Diagnosis not present

## 2023-11-17 DIAGNOSIS — F331 Major depressive disorder, recurrent, moderate: Secondary | ICD-10-CM

## 2023-11-17 DIAGNOSIS — F1210 Cannabis abuse, uncomplicated: Secondary | ICD-10-CM

## 2023-11-17 DIAGNOSIS — R454 Irritability and anger: Secondary | ICD-10-CM | POA: Diagnosis not present

## 2023-11-17 MED ORDER — DIVALPROEX SODIUM ER 250 MG PO TB24: 250.0000 mg | ORAL_TABLET | Freq: Three times a day (TID) | ORAL | 2 refills | Status: AC

## 2023-11-17 MED ORDER — ESCITALOPRAM OXALATE 20 MG PO TABS
20.0000 mg | ORAL_TABLET | Freq: Every day | ORAL | 2 refills | Status: AC
Start: 2023-11-17 — End: ?

## 2023-11-17 MED ORDER — BUSPIRONE HCL 5 MG PO TABS: 5.0000 mg | ORAL_TABLET | Freq: Two times a day (BID) | ORAL | 2 refills | Status: DC

## 2023-11-17 NOTE — Progress Notes (Signed)
 [facility] MD/PA/NP OP Progress Note  Patient location; office Provider location; office  11/17/2023 2:06 PM JACQUEZ SHEETZ  MRN:  [id number]  Chief Complaint:  Chief Complaint  Patient presents with   Anxiety   Medication Refill   Follow-up   HPI: Patient came today to the office for his follow-up appointment.  He was last seen in June.  He was recommend to take Depakote  250 mg in the morning and 500 mg at bedtime but he called back and reported taking the Depakote  is causing insomnia.  We recommend to take twice a day but he started taking third pill of Depakote  6 PM and that helps.  He reported Depakote  overall helping his mood irritability and anger.  He still have short temper and get agitated but takes more time to get angry.  He reported getting along better than before with his son and his wife.  His son is staying with him.  He is not sure if the son is still using drugs but claimed that wife stopped smoking marijuana and he decided last night that he will also stop using marijuana.  He has no tremors, shakes or any EPS.  He recently had cardiac procedure for A-fib and is pleased everything went well and he has no chest pain or palpitation.  His biggest concern is anxiety and sleep.  He is taking Xanax prescribed by his PCP at [facility] but does not feel it is working and helping his anxiety.  He has memory issues and we have referred for neurocognitive testing and neurology referral but patient is working with his insurance to find the doctor who is in network.  Overall he feel medicine working and helping.  He is taking Lexapro  from PCP but like to have his medication refilled from our office.  He is also taking better control of his diabetes.  His last hemoglobin A1c apparently much better because he recently had a visit with his PCP and his metformin  dose was reduced.  We have requested records from PCP but so far we have not received.  His appetite is okay.  His daughter lives in [place]  but  patient has no contact with her.  He reported anxiety is sometimes overwhelming and he tried to avoid going to public places.  He is in therapy with Joane Ricker.  Visit Diagnosis:    ICD-10-CM   1. Irritability and anger  R45.4 divalproex  (DEPAKOTE  ER) 250 MG 24 hr tablet    2. Major depressive disorder, recurrent episode, moderate (HCC)  F33.1 divalproex  (DEPAKOTE  ER) 250 MG 24 hr tablet    escitalopram  (LEXAPRO ) 20 MG tablet    3. Generalized anxiety disorder  F41.1 divalproex  (DEPAKOTE  ER) 250 MG 24 hr tablet    escitalopram  (LEXAPRO ) 20 MG tablet    busPIRone  (BUSPAR ) 5 MG tablet    4. Memory disorder  R41.3 divalproex  (DEPAKOTE  ER) 250 MG 24 hr tablet    5. Mild tetrahydrocannabinol (THC) abuse  F12.10 busPIRone  (BUSPAR ) 5 MG tablet       Past Psychiatric History:  History of suicidal attempt by cutting arm more than 30 years ago while intoxicated and admitted at [facility].  As per EMR took Zoloft , Belsomra but no details.  PCP provided Xanax, Seroquel  25 mg twice a day and Lexapro .  History of head trauma.  History of heavy drinking, crack cocaine but claimed to be sober for a while.    Past Medical History:  Past Medical History:  Diagnosis Date  Anxiety    Arthritis    Bilateral carotid artery stenosis    s/p  bilateral CEA  2021 and 2022   Chronic anticoagulation    eliquis --- managed by dr ladona   Chronic gouty arthritis    per pt bilateral knees and great toes   Chronic kidney disease    Renal artery stenosis- had renal stent done in [place]    Coronary artery disease 2007   cardiologsit--- dr ladona;   NSTEMI 12-18-2015  s/p cath w/ PCI balloon angioplasty to dPDA/  DES overlap to prox and mid LAD   Depression    DOE (dyspnea on exertion)    08-05-2022  per pt sob w/ stairs   Emphysema lung (HCC)    Family history of premature CAD    Full dentures    History of bowel resection 04/19/2021   admission in epic ;  recent admission for DKA, 04-16-2021 readmittd  04-17-2021 abdominal pain , dx embolic mesenteric ischemic bowel  s/p ileocecectomy   History of diabetic ketoacidosis 04/16/2021   admission in epic , dx DM2   History of illicit drug use    History of non-ST elevation myocardial infarction (NSTEMI) 12/18/2015   History of transient ischemic attack (TIA)    08-05-2022  per pt remote hx, unsure when;   per imaging in epic/ care everywhere MRI  11-20-2020  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 (pons, basal ganglia, periventrcular white matter of cerebellar)   Hyperlipidemia    Hypertension    Ischemic segment of 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-s/p exploratory laparotomy on 1/22 04/29/2021   PAD (peripheral artery disease) (HCC)    s/p  vascular intervention in [place]  , pt unsure what was done but w/ cardiac cath 12-18-2015 aortagram w/ lower extremities was done showed right renal artery stent   PAF (paroxysmal atrial fibrillation) (HCC)    followed by ganji;   first started post op carotid surgery,  takes eliquis    S/P drug eluting coronary stent placement 12/18/2015   to prox and mid LAD,  balloon angioplasty to distal PDA   Short-term memory loss    08-05-2022  pt stated smoking majiuana helps with his memory   Type 2 diabetes mellitus treated with insulin  (HCC)    followed by pcp   (08-05-2022  per pt checks blood sugar twice dialy,  fasting average 98-158)    Past Surgical History:  Procedure Laterality Date   CARDIAC CATHETERIZATION N/A 12/18/2015   Procedure: Left Heart Cath and Coronary Angiography;  Surgeon: Gordy ladona, MD;  Location: [facility] INVASIVE CV LAB;  Service: Cardiovascular;  Laterality: N/A;   CARDIAC CATHETERIZATION N/A 12/18/2015   Procedure: Coronary Stent Intervention;  Surgeon: Gordy ladona, MD;  Location: [facility] INVASIVE CV LAB;  Service: Cardiovascular;  Laterality: N/A;   CARDIAC CATHETERIZATION N/A 12/18/2015   Procedure: Coronary Balloon Angioplasty;  Surgeon: Gordy ladona, MD;  Location: [facility] INVASIVE CV LAB;  Service: Cardiovascular;  Laterality: N/A;   CARDIAC CATHETERIZATION  01/17/2006   @[facility]  by dr bensimhon;   for chest pain/ palpitations/ +cocaine;    mild nonobstructive CAD   CARDIAC CATHETERIZATION  05/03/2008   @[facility]  by dr bensimhon;  for severe CP/ severely elevated BP;  nonobstructive CAD w/ normal LVF,  ? abuse withdrawal   CARDIOVERSION N/A 09/23/2023   Procedure: CARDIOVERSION;  Surgeon: Kate Lonni CROME, MD;  Location: [facility] INVASIVE CV LAB;  Service: Cardiovascular;  Laterality: N/A;   COLONOSCOPY N/A 01/12/2016   Procedure: COLONOSCOPY;  Surgeon: Elspeth Deward Naval, MD;  Location: [facility] ENDOSCOPY;  Service: Gastroenterology;  Laterality: N/A;   COLONOSCOPY WITH PROPOFOL   08/30/2022   [email or web address] by dr shaunna. joshua   ENDARTERECTOMY Left 03/04/2020   Procedure: LEFT ENDARTERECTOMY CAROTID;  Surgeon: Oris Krystal FALCON, MD;  Location: [facility] OR;  Service: Vascular;  Laterality: Left;   ENDARTERECTOMY Right 04/22/2020   Procedure: RIGHT CAROTID ENDARTERECTOMY;  Surgeon: Oris Krystal FALCON, MD;  Location: [facility] OR;  Service: Vascular;  Laterality: Right;   INGUINAL HERNIA REPAIR Right 11/10/2022   Procedure: LAPAROSCOPIC RIGHT INGUINAL HERNIA REPAIR WITH MESH;  Surgeon: Lyndel Deward PARAS, MD;  Location: [facility] ORS;  Service: General;  Laterality: Right;   LAPAROSCOPY N/A 04/19/2021   Procedure: LAPAROSCOPY DIAGNOSTIC;  Surgeon: Lyndel Deward PARAS, MD;  Location: [facility] OR;  Service: General;  Laterality: N/A;   LAPAROTOMY N/A 04/19/2021   Procedure: EXPLORATORY LAPAROTOMY, ILEOCECECTOMY;  Surgeon: Lyndel Deward PARAS, MD;  Location: [facility] OR;  Service: General;  Laterality: N/A;   PATCH ANGIOPLASTY Left 03/04/2020   Procedure: PATCH ANGIOPLASTY;  Surgeon: Oris Krystal FALCON, MD;  Location: [facility] OR;  Service: Vascular;  Laterality: Left;   PERIPHERAL VASCULAR CATHETERIZATION N/A 12/18/2015   Procedure: Abdominal Aortogram;  Surgeon: Gordy Bergamo,  MD;  Location: [facility] INVASIVE CV LAB;  Service: Cardiovascular;  Laterality: N/A;   PERIPHERAL VASCULAR INTERVENTION     in [place]    (per pt unsure what was done or when)   UMBILICAL HERNIA REPAIR     age 34s    Family Psychiatric History: Reviewed  Family History:  Family History  Problem Relation Age of Onset   Heart attack Mother    Heart attack Father    IgA nephropathy Son     Social History:  Social History   Socioeconomic History   Marital status: Married    Spouse name: Not on file   Number of children: 2   Years of education: Not on file   Highest education level: Not on file  Occupational History   Not on file  Tobacco Use   Smoking status: Former    Current packs/day: 0.00    Average packs/day: 1 pack/day for 23.0 years (23.0 ttl pk-yrs)    Types: Cigarettes    Start date: 05/03/1999    Quit date: 05/02/2022    Years since quitting: 1.5   Smokeless tobacco: Never  Vaping Use   Vaping status: Some Days  Substance and Sexual Activity   Alcohol use: Not Currently    Comment: quit drinking   Drug use: Yes    Types: Marijuana    Comment: 08-05-2022  per pt smokes 2-3 times a week/   hx remote ilicit drug use   Sexual activity: Not Currently  Other Topics Concern   Not on file  Social History Narrative   Not on file   Social Drivers of Health   Financial Resource Strain: Not on file  Food Insecurity: Not on file  Transportation Needs: Not on file  Physical Activity: Not on file  Stress: Not on file  Social Connections: Unknown (08/11/2021)   Received from [facility]   Social Network    Social Network: Not on file    Allergies:  Allergies  Allergen Reactions   Codeine Nausea And Vomiting    Metabolic Disorder Labs: Lab Results  Component Value Date   HGBA1C 6.2 (H) 11/01/2022   MPG 131.24 11/01/2022   MPG >398 04/19/2021   No results found for:  PROLACTIN Lab Results  Component Value Date   CHOL 91 (L) 08/26/2023   TRIG 62 08/26/2023    HDL 42 08/26/2023   CHOLHDL 2.2 08/26/2023   VLDL 25 12/18/2015   LDLCALC 35 08/26/2023   LDLCALC 18 09/16/2021   Lab Results  Component Value Date   TSH 1.72 09/16/2021   TSH 0.636 04/17/2021    Therapeutic Level Labs: No results found for: LITHIUM No results found for: VALPROATE No results found for: CBMZ  Current Medications: Current Outpatient Medications  Medication Sig Dispense Refill   albuterol (VENTOLIN HFA) 108 (90 Base) MCG/ACT inhaler Inhale 2 puffs into the lungs every 6 (six) hours as needed for wheezing or shortness of breath.     ALPRAZolam (XANAX) 0.5 MG tablet Take 0.5 mg by mouth at bedtime.     amLODipine  (NORVASC ) 10 MG tablet Take 10 mg by mouth daily.     atorvastatin  (LIPITOR ) 10 MG tablet Take 1/2 tablet (5 mg total) by mouth daily. 45 tablet 3   colchicine  0.6 MG tablet Take 0.6 mg by mouth daily as needed (gout flares).     divalproex  (DEPAKOTE  ER) 250 MG 24 hr tablet Take 250 mg by mouth 2 (two) times daily.     ELIQUIS  5 MG TABS tablet TAKE 1 TABLET(5 MG) BY MOUTH TWICE DAILY 60 tablet 11   escitalopram  (LEXAPRO ) 20 MG tablet Take 20 mg by mouth daily.     ferrous sulfate 325 (65 FE) MG EC tablet Take 325 mg by mouth daily.     hydrALAZINE  (APRESOLINE ) 50 MG tablet Take 1 tablet (50 mg total) by mouth 3 (three) times daily. 270 tablet 3   insulin  glargine (LANTUS ) 100 UNIT/ML Solostar Pen Inject 22 Units into the skin daily. (Patient taking differently: Inject 10 Units into the skin daily.) 15 mL 11   Insulin  Pen Needle 32G X 4 MM MISC 1 each by Does not apply route as needed. 200 each 0   losartan -hydrochlorothiazide  (HYZAAR) 100-12.5 MG tablet Take 1 tablet by mouth daily.     metFORMIN  (GLUCOPHAGE ) 500 MG tablet Take 1 tablet (500 mg total) by mouth 2 (two) times daily with a meal. (Patient taking differently: Take 250 mg by mouth 2 (two) times daily with a meal. 1/2 tablet AM, 1/2 tablet PM) 60 tablet 11   metoprolol  succinate (TOPROL -XL) 50 MG  24 hr tablet Take 1 tablet (50 mg total) by mouth daily. Take with or immediately following a meal. 90 tablet 3   Multiple Vitamins-Minerals (MULTIVITAMIN WITH MINERALS) tablet Take 1 tablet by mouth daily.     nitroGLYCERIN  (NITROSTAT ) 0.4 MG SL tablet Place 1 tablet (0.4 mg total) under the tongue every 5 (five) minutes as needed. For chest pain 30 tablet 0   VASCEPA  1 g capsule TAKE 2 CAPSULES(2 GRAMS) BY MOUTH TWICE DAILY 120 capsule 1   No current facility-administered medications for this visit.     Musculoskeletal: Strength & Muscle Tone: within normal limits Gait & Station: limping but does not need support for balance Patient leans: N/A  Psychiatric Specialty Exam: Review of Systems  Blood pressure 119/67, pulse (!) 56, height 5' 6 (1.676 m), weight 179 lb (81.2 kg), SpO2 100%.There is no height or weight on file to calculate BMI.  General Appearance: Fairly Groomed  Eye Contact:  Fair  Speech:  Slow  Volume:  Decreased  Mood:  Euthymic  Affect:  Labile  Thought Process:  Descriptions of Associations: Intact  Orientation:  Full (Time,  Place, and Person)  Thought Content: WDL   Suicidal Thoughts:  No  Homicidal Thoughts:  No  Memory:  Immediate;   Fair Recent;   Fair Remote;   Fair  Judgement:  Fair  Insight:  Shallow  Psychomotor Activity:  Decreased  Concentration:  Concentration: Fair and Attention Span: Fair  Recall:  [employer] of Knowledge: Fair  Language: Fair  Akathisia:  No  Handed:  Right  AIMS (if indicated): not done  Assets:  Communication Skills Desire for Improvement Housing Transportation  ADL's:  Intact  Cognition: Impaired,  Mild  Sleep:  up at 2:30 am and cannot sleep   Screenings: Insurance account manager from 09/19/2023 in [facility] at [facility] Nutrition from 05/05/2021 in [facility]  - A Dept Of [place]. [facility]  PHQ-2 Total Score 6 6  PHQ-9 Total Score 20 18    Flowsheet Row Admission (Discharged) from 09/23/2023 in [facility] CARDIAC CATH LAB Counselor from 09/19/2023 in [facility] at [facility] Admission (Discharged) from 11/10/2022 in [place] [facility] PERIOPERATIVE AREA  C-SSRS RISK CATEGORY Error: Question 6 not populated Moderate Risk No Risk     Assessment and Plan: Patient is 68 year old Caucasian married man with history of diabetes, hypertension, A-fib, coronary artery disease, chronic back pain, gout, distant history of substance use, history of head trauma, learning disability, memory problem, depression and anger issues.  Reviewed blood work results and collect information from other providers and current medication.  He reported his last smoke of marijuana was yesterday as he decided not to smoke anymore and it is a joint decision he made with his wife.  Since taking the Depakote  earlier than bedtime it is helping his mood but also he is not sleeping very well.  When he took together Depakote  2 pills at nighttime he did not sleep.  I recommend to trial low-dose BuSpar  to help his anxiety and sleep.  Will keep the Depakote  to 50 mg 3 times a day.  We will also continue Lexapro  20 mg daily.  Encouraged to keep appointment and therapy with Joane Ricker.  Patient is in process of getting a referral for neurology after his insurance provide him the physician who is in his network.  Overall mood stable and not getting as angry with the wife and his son.  Follow-up in 3 months.   Collaboration of Care: Collaboration of Care: Other provider involved in patient's care AEB we will get collateral information from PCP.  Patient/Guardian was advised Release of Information must be obtained prior to any record release in order to collaborate their care with an outside provider. Patient/Guardian was advised if they have not already done so to contact the registration department to sign all necessary forms in order for us  to  release information regarding their care.   Consent: Patient/Guardian gives verbal consent for treatment and assignment of benefits for services provided during this visit. Patient/Guardian expressed understanding and agreed to proceed.    Leni ONEIDA Client, MD 11/17/2023, 2:06 PM

## 2023-11-17 NOTE — Telephone Encounter (Signed)
 Contacted patient and let him know to hold Eliquis  2 days prior to his . procedure

## 2023-11-17 NOTE — Telephone Encounter (Signed)
 Great, thanks. Jan, FYI, if you can let the patient know

## 2023-11-17 NOTE — Telephone Encounter (Signed)
 Hi - thanks for seeing our patient. Just wanted to clarify - I was wondering if this patient can hold his Eliquis  for 2 days prior to his exams, not 1, as we may be removing polyps, at risk for bleeding. Thanks for your opinion.

## 2023-11-17 NOTE — Telephone Encounter (Signed)
 Sure I am good with holding it for 2 days and restart when appropriate. He is also maintaining sinus rhythm

## 2023-11-22 ENCOUNTER — Ambulatory Visit (AMBULATORY_SURGERY_CENTER): Admitting: *Deleted

## 2023-11-22 VITALS — Ht 66.0 in | Wt 170.0 lb

## 2023-11-22 DIAGNOSIS — Z860100 Personal history of colon polyps, unspecified: Secondary | ICD-10-CM

## 2023-11-22 MED ORDER — NA SULFATE-K SULFATE-MG SULF 17.5-3.13-1.6 GM/177ML PO SOLN: 1.0000 | Freq: Once | ORAL | 0 refills | Status: AC

## 2023-11-22 NOTE — Progress Notes (Signed)
 Pt's name and DOB verified at the beginning of the pre-visit with 2 identifiers  Pt denies any difficulty with ambulating,sitting, laying down or rolling side to side  Pt has no issues moving head neck or swallowing  No egg or soy allergy known to patient   No issues known to pt with past sedation with any surgeries or procedures  No FH of Malignant Hyperthermia  Pt is not on home 02   Pt is on blood thinners 2 day HOLD  Pt denies issues with constipation   Pt is not on dialysis  Pt hx of MI SVT and Afib  Pt denies any upcoming cardiac testing  Visit in person  Pt states weight is 170 lb   Instructions reviewed. Pt given  both [facility] main # and MD on call # prior to instructions.  Informed pt to come in at the time discussed and is shown on PV instructions.  Instructed pt to review instructions again prior to procedure and call main # given if has any questions.. Pt states they will.    Instructed pt on all aspects of written instructions including med holds clothing to wear and foods to eat and not eat as well as after procedure legal restrictions and to call MD on call if needed.. Pt states understanding. Instructed not to smoke Marijuana day before and day of procedure pt states he will

## 2023-11-24 ENCOUNTER — Ambulatory Visit (HOSPITAL_COMMUNITY): Admitting: Licensed Clinical Social Worker

## 2023-12-06 ENCOUNTER — Ambulatory Visit: Admitting: Gastroenterology

## 2023-12-06 ENCOUNTER — Encounter: Payer: Self-pay | Admitting: Gastroenterology

## 2023-12-06 VITALS — BP 156/77 | HR 65 | Temp 98.3°F | Resp 15 | Ht 66.0 in | Wt 170.0 lb

## 2023-12-06 DIAGNOSIS — D125 Benign neoplasm of sigmoid colon: Secondary | ICD-10-CM

## 2023-12-06 DIAGNOSIS — Z860101 Personal history of adenomatous and serrated colon polyps: Secondary | ICD-10-CM

## 2023-12-06 DIAGNOSIS — K5730 Diverticulosis of large intestine without perforation or abscess without bleeding: Secondary | ICD-10-CM | POA: Diagnosis not present

## 2023-12-06 DIAGNOSIS — Z1211 Encounter for screening for malignant neoplasm of colon: Secondary | ICD-10-CM | POA: Diagnosis not present

## 2023-12-06 DIAGNOSIS — K648 Other hemorrhoids: Secondary | ICD-10-CM | POA: Diagnosis not present

## 2023-12-06 DIAGNOSIS — Z980 Intestinal bypass and anastomosis status: Secondary | ICD-10-CM

## 2023-12-06 DIAGNOSIS — Z860100 Personal history of colon polyps, unspecified: Secondary | ICD-10-CM

## 2023-12-06 MED ORDER — SODIUM CHLORIDE 0.9 % IV SOLN: 500.0000 mL | Freq: Once | INTRAVENOUS | Status: DC

## 2023-12-06 NOTE — Progress Notes (Signed)
 Called to room to assist during endoscopic procedure.  Patient ID and intended procedure confirmed with present staff. Received instructions for my participation in the procedure from the performing physician.

## 2023-12-06 NOTE — Op Note (Signed)
 [facility] Patient Name: [name] Procedure Date: 12/06/2023 3:01 PM MRN: [id number] Endoscopist: Elspeth P. Leigh , MD, [id number] Age: 68 Referring MD:  Date of Birth: Jul 03, 1955 Gender: Male Account #: [id number] Procedure:                Colonoscopy Indications:              High risk colon cancer surveillance: Personal                            history of colonic polyps - adenoma removed 2017,                            last exam 08/2022 - poor prep reported. Medicines:                Monitored Anesthesia Care Procedure:                Pre-Anesthesia Assessment:                           - Prior to the procedure, a History and Physical                            was performed, and patient medications and                            allergies were reviewed. The patient's tolerance of                            previous anesthesia was also reviewed. The risks                            and benefits of the procedure and the sedation                            options and risks were discussed with the patient.                            All questions were answered, and informed consent                            was obtained. Prior Anticoagulants: The patient has                            taken Eliquis  (apixaban ), last dose was 2 days                            prior to procedure. ASA Grade Assessment: III - A                            patient with severe systemic disease. After                            reviewing the risks and benefits, the patient was  deemed in satisfactory condition to undergo the                            procedure.                           After obtaining informed consent, the colonoscope                            was passed under direct vision. Throughout the                            procedure, the patient's blood pressure, pulse, and                            oxygen saturations were monitored continuously.  The                            [id number]-[id number] Colonoscope [id number] was introduced                            through the anus and advanced to the the                            ileocolonic anastomosis. The colonoscopy was                            performed without difficulty. The patient tolerated                            the procedure well. The quality of the bowel                            preparation was adequate. Scope In: 3:19:51 PM Scope Out: 3:42:46 PM Scope Withdrawal Time: 0 hours 16 minutes 59 seconds  Total Procedure Duration: 0 hours 22 minutes 55 seconds  Findings:                 The perianal and digital rectal examinations were                            normal.                           There was evidence of a prior end-to-side                            ileo-colonic anastomosis in the right colon. This                            was patent and was characterized by healthy                            appearing mucosa. The anastomosis appeared to be  coming off the right colon opposite the cecum? -                            there appeared to be an AO and residual IC valve -                            unusual appearing surgical anastomosis.                           A few small-mouthed diverticula were found in the                            ascending colon.                           A 4 to 5 mm polyp was found in the sigmoid colon.                            The polyp was flat. The polyp was removed with a                            cold snare. Resection and retrieval were complete.                           Internal hemorrhoids were found during retroflexion.                           A large amount of liquid stool was found in the                            entire colon, making visualization difficult.                            Lavage of the colon was performed using copious                            amounts of fluid, resulting in clearance  with                            adequate visualization.                           The exam was otherwise without abnormality. Complications:            No immediate complications. Estimated blood loss:                            Minimal. Estimated Blood Loss:     Estimated blood loss was minimal. Impression:               - Patent end-to-side ileo-colonic anastomosis,                            characterized by healthy appearing mucosa.                           -  Diverticulosis in the ascending colon.                           - One 4 to 5 mm polyp in the sigmoid colon, removed                            with a cold snare. Resected and retrieved.                           - Internal hemorrhoids.                           - Stool in the entire examined colon leading to                            extensive lavage with adequate views.                           - The examination was otherwise normal. Recommendation:           - Patient has a contact number available for                            emergencies. The signs and symptoms of potential                            delayed complications were discussed with the                            patient. Return to normal activities tomorrow.                            Written discharge instructions were provided to the                            patient.                           - Resume previous diet.                           - Continue present medications.                           - Resume Eliquis  tomorrow.                           - Await pathology results. Elspeth P. Leon Goodnow, MD 12/06/2023 3:53:56 PM This report has been signed electronically.

## 2023-12-06 NOTE — Progress Notes (Signed)
 [facility] History and Physical   Primary Care Physician:  Maree Leni Edyth DELENA, MD   Reason for Procedure:   History of colon polyps  Plan:    colonoscopy    HPI: [name] is a 68 y.o. male  here for colonoscopy surveillance - history of adenoma in 2017, last exam 08/2022 limited by poor prep. History of CVA, on Eliquis , held for 2 days prior to this exam.   . Patient denies any bowel symptoms at this time. No family history of colon cancer known. Otherwise feels well without any cardiopulmonary symptoms.   I have discussed risks / benefits of anesthesia and endoscopic procedure with Christopher LITTIE Ka and they wish to proceed with the exams as outlined today.    Past Medical History:  Diagnosis Date   Anxiety    Arthritis    Bilateral carotid artery stenosis    s/p  bilateral CEA  2021 and 2022   Chronic anticoagulation    eliquis --- managed by dr ladona   Chronic gouty arthritis    per pt bilateral knees and great toes   Chronic kidney disease    Renal artery stenosis- had renal stent done in [place]    Coronary artery disease 2007   cardiologsit--- dr ladona;   NSTEMI 12-18-2015  s/p cath w/ PCI balloon angioplasty to dPDA/  DES overlap to prox and mid LAD   Depression    DOE (dyspnea on exertion)    08-05-2022  per pt sob w/ stairs   Emphysema lung (HCC)    Family history of premature CAD    Full dentures    History of bowel resection 04/19/2021   admission in epic ;  recent admission for DKA, 04-16-2021 readmittd 04-17-2021 abdominal pain , dx embolic mesenteric ischemic bowel  s/p ileocecectomy   History of diabetic ketoacidosis 04/16/2021   admission in epic , dx DM2   History of illicit drug use    History of non-ST elevation myocardial infarction (NSTEMI) 12/18/2015   History of transient ischemic attack (TIA)    08-05-2022  per pt remote hx, unsure when;   per imaging in epic/ care everywhere MRI  11-20-2020  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  (pons, basal ganglia, periventrcular white matter of cerebellar)   Hyperlipidemia    Hypertension    Ischemic segment of 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-s/p exploratory laparotomy on 1/22 04/29/2021   Myocardial infarction [facility])    PAD (peripheral artery disease) (HCC)    s/p  vascular intervention in [place]  , pt unsure what was done but w/ cardiac cath 12-18-2015 aortagram w/ lower extremities was done showed right renal artery stent   PAF (paroxysmal atrial fibrillation) (HCC)    followed by ganji;   first started post op carotid surgery,  takes eliquis    S/P drug eluting coronary stent placement 12/18/2015   to prox and mid LAD,  balloon angioplasty to distal PDA   Short-term memory loss    08-05-2022  pt stated smoking majiuana helps with his memory   Stroke [facility])    Substance abuse (HCC)    Type 2 diabetes mellitus treated with insulin  (HCC)    followed by pcp   (08-05-2022  per pt checks blood sugar twice dialy,  fasting average 98-158)    Past Surgical History:  Procedure Laterality Date   CARDIAC CATHETERIZATION N/A 12/18/2015   Procedure: Left Heart Cath and Coronary Angiography;  Surgeon: Gordy ladona, MD;  Location: [facility] INVASIVE  CV LAB;  Service: Cardiovascular;  Laterality: N/A;   CARDIAC CATHETERIZATION N/A 12/18/2015   Procedure: Coronary Stent Intervention;  Surgeon: Gordy Bergamo, MD;  Location: [facility] INVASIVE CV LAB;  Service: Cardiovascular;  Laterality: N/A;   CARDIAC CATHETERIZATION N/A 12/18/2015   Procedure: Coronary Balloon Angioplasty;  Surgeon: Gordy Bergamo, MD;  Location: [facility] INVASIVE CV LAB;  Service: Cardiovascular;  Laterality: N/A;   CARDIAC CATHETERIZATION  01/17/2006   @[facility]  by dr cherrie;   for chest pain/ palpitations/ +cocaine;    mild nonobstructive CAD   CARDIAC CATHETERIZATION  05/03/2008   @[facility]  by dr cherrie;  for severe CP/ severely elevated BP;  nonobstructive CAD w/ normal LVF,  ? abuse withdrawal    CARDIOVERSION N/A 09/23/2023   Procedure: CARDIOVERSION;  Surgeon: Kate Lonni CROME, MD;  Location: [facility] INVASIVE CV LAB;  Service: Cardiovascular;  Laterality: N/A;   COLONOSCOPY N/A 01/12/2016   Procedure: COLONOSCOPY;  Surgeon: Elspeth Deward Naval, MD;  Location: [facility] ENDOSCOPY;  Service: Gastroenterology;  Laterality: N/A;   COLONOSCOPY WITH PROPOFOL   08/30/2022   [email or web address] by dr shaunna. [email or web address]   ENDARTERECTOMY Left 03/04/2020   Procedure: LEFT ENDARTERECTOMY CAROTID;  Surgeon: Oris Krystal FALCON, MD;  Location: [facility] OR;  Service: Vascular;  Laterality: Left;   ENDARTERECTOMY Right 04/22/2020   Procedure: RIGHT CAROTID ENDARTERECTOMY;  Surgeon: Oris Krystal FALCON, MD;  Location: [facility] OR;  Service: Vascular;  Laterality: Right;   INGUINAL HERNIA REPAIR Right 11/10/2022   Procedure: LAPAROSCOPIC RIGHT INGUINAL HERNIA REPAIR WITH MESH;  Surgeon: Lyndel Deward PARAS, MD;  Location: [facility] ORS;  Service: General;  Laterality: Right;   LAPAROSCOPY N/A 04/19/2021   Procedure: LAPAROSCOPY DIAGNOSTIC;  Surgeon: Lyndel Deward PARAS, MD;  Location: [facility] OR;  Service: General;  Laterality: N/A;   LAPAROTOMY N/A 04/19/2021   Procedure: EXPLORATORY LAPAROTOMY, ILEOCECECTOMY;  Surgeon: Lyndel Deward PARAS, MD;  Location: [facility] OR;  Service: General;  Laterality: N/A;   PATCH ANGIOPLASTY Left 03/04/2020   Procedure: PATCH ANGIOPLASTY;  Surgeon: Oris Krystal FALCON, MD;  Location: [facility] OR;  Service: Vascular;  Laterality: Left;   PERIPHERAL VASCULAR CATHETERIZATION N/A 12/18/2015   Procedure: Abdominal Aortogram;  Surgeon: Gordy Bergamo, MD;  Location: [facility] INVASIVE CV LAB;  Service: Cardiovascular;  Laterality: N/A;   PERIPHERAL VASCULAR INTERVENTION     in [place]    (per pt unsure what was done or when)   UMBILICAL HERNIA REPAIR     age 46s    Prior to Admission medications   Medication Sig Start Date End Date Taking? Authorizing Provider  albuterol (VENTOLIN HFA) 108 (90 Base) MCG/ACT inhaler Inhale 2  puffs into the lungs every 6 (six) hours as needed for wheezing or shortness of breath.   Yes [provider]  amLODipine  (NORVASC ) 10 MG tablet Take 10 mg by mouth daily.   Yes [provider]  atorvastatin  (LIPITOR ) 10 MG tablet Take 1/2 tablet (5 mg total) by mouth daily. 10/27/23 01/25/24 Yes Bergamo Gordy, MD  busPIRone  (BUSPAR ) 5 MG tablet Take 1 tablet (5 mg total) by mouth 2 (two) times daily. 11/17/23  Yes Arfeen, Leni DASEN, MD  colchicine  0.6 MG tablet Take 0.6 mg by mouth daily as needed (gout flares).   Yes [provider]  divalproex  (DEPAKOTE  ER) 250 MG 24 hr tablet Take 1 tablet (250 mg total) by mouth 3 (three) times daily after meals. 11/17/23  Yes Arfeen, Leni DASEN, MD  escitalopram  (LEXAPRO ) 20 MG tablet Take 1 tablet (20 mg total) by  mouth daily. 11/17/23  Yes Arfeen, Leni DASEN, MD  hydrALAZINE  (APRESOLINE ) 50 MG tablet Take 1 tablet (50 mg total) by mouth 3 (three) times daily. 10/27/23 01/25/24 Yes Ladona Heinz, MD  insulin  glargine (LANTUS ) 100 UNIT/ML Solostar Pen Inject 22 Units into the skin daily. 05/03/21  Yes Ghimire, Donalda HERO, MD  Insulin  Pen Needle 32G X 4 MM MISC 1 each by Does not apply route as needed. 05/03/21  Yes Ghimire, Donalda HERO, MD  losartan -hydrochlorothiazide  (HYZAAR) 100-12.5 MG tablet Take 1 tablet by mouth daily. 08/14/21  Yes [provider]  metFORMIN  (GLUCOPHAGE ) 500 MG tablet Take 1 tablet (500 mg total) by mouth 2 (two) times daily with a meal. 05/03/21 12/06/23 Yes Ghimire, Donalda HERO, MD  metoprolol  succinate (TOPROL -XL) 50 MG 24 hr tablet Take 1 tablet (50 mg total) by mouth daily. Take with or immediately following a meal. 09/23/23 09/17/24 Yes Kate Lonni CROME, MD  VASCEPA  1 g capsule TAKE 2 CAPSULES(2 GRAMS) BY MOUTH TWICE DAILY 09/25/21  Yes Cantwell, Celeste C, PA-C  ALPRAZolam (XANAX) 0.5 MG tablet Take 0.5 mg by mouth at bedtime. 07/23/23   [provider]  ELIQUIS  5 MG TABS tablet TAKE 1 TABLET(5 MG) BY MOUTH TWICE DAILY  06/26/21   Cantwell, Celeste C, PA-C  ferrous sulfate 325 (65 FE) MG EC tablet Take 325 mg by mouth daily.    [provider]  Multiple Vitamins-Minerals (MULTIVITAMIN WITH MINERALS) tablet Take 1 tablet by mouth daily. Patient not taking: Reported on 12/06/2023    [provider]  nitroGLYCERIN  (NITROSTAT ) 0.4 MG SL tablet Place 1 tablet (0.4 mg total) under the tongue every 5 (five) minutes as needed. For chest pain 10/23/19   Ladona Heinz, MD    Current Outpatient Medications  Medication Sig Dispense Refill   albuterol (VENTOLIN HFA) 108 (90 Base) MCG/ACT inhaler Inhale 2 puffs into the lungs every 6 (six) hours as needed for wheezing or shortness of breath.     amLODipine  (NORVASC ) 10 MG tablet Take 10 mg by mouth daily.     atorvastatin  (LIPITOR ) 10 MG tablet Take 1/2 tablet (5 mg total) by mouth daily. 45 tablet 3   busPIRone  (BUSPAR ) 5 MG tablet Take 1 tablet (5 mg total) by mouth 2 (two) times daily. 60 tablet 2   colchicine  0.6 MG tablet Take 0.6 mg by mouth daily as needed (gout flares).     divalproex  (DEPAKOTE  ER) 250 MG 24 hr tablet Take 1 tablet (250 mg total) by mouth 3 (three) times daily after meals. 90 tablet 2   escitalopram  (LEXAPRO ) 20 MG tablet Take 1 tablet (20 mg total) by mouth daily. 30 tablet 2   hydrALAZINE  (APRESOLINE ) 50 MG tablet Take 1 tablet (50 mg total) by mouth 3 (three) times daily. 270 tablet 3   insulin  glargine (LANTUS ) 100 UNIT/ML Solostar Pen Inject 22 Units into the skin daily. 15 mL 11   Insulin  Pen Needle 32G X 4 MM MISC 1 each by Does not apply route as needed. 200 each 0   losartan -hydrochlorothiazide  (HYZAAR) 100-12.5 MG tablet Take 1 tablet by mouth daily.     metFORMIN  (GLUCOPHAGE ) 500 MG tablet Take 1 tablet (500 mg total) by mouth 2 (two) times daily with a meal. 60 tablet 11   metoprolol  succinate (TOPROL -XL) 50 MG 24 hr tablet Take 1 tablet (50 mg total) by mouth daily. Take with or immediately following a meal. 90 tablet 3    VASCEPA  1 g capsule TAKE 2 CAPSULES(2 GRAMS)  BY MOUTH TWICE DAILY 120 capsule 1   ALPRAZolam (XANAX) 0.5 MG tablet Take 0.5 mg by mouth at bedtime.     ELIQUIS  5 MG TABS tablet TAKE 1 TABLET(5 MG) BY MOUTH TWICE DAILY 60 tablet 11   ferrous sulfate 325 (65 FE) MG EC tablet Take 325 mg by mouth daily.     Multiple Vitamins-Minerals (MULTIVITAMIN WITH MINERALS) tablet Take 1 tablet by mouth daily. (Patient not taking: Reported on 12/06/2023)     nitroGLYCERIN  (NITROSTAT ) 0.4 MG SL tablet Place 1 tablet (0.4 mg total) under the tongue every 5 (five) minutes as needed. For chest pain 30 tablet 0   Current Facility-Administered Medications  Medication Dose Route Frequency Provider Last Rate Last Admin   0.9 %  sodium chloride  infusion  500 mL Intravenous Once Samadhi Mahurin, Elspeth SQUIBB, MD        Allergies as of 12/06/2023 - Review Complete 12/06/2023  Allergen Reaction Noted   Codeine Nausea And Vomiting 08/05/2022    Family History  Problem Relation Age of Onset   Heart attack Mother    Heart attack Father    IgA nephropathy Son    Colon cancer Neg Hx    Colon polyps Neg Hx    Esophageal cancer Neg Hx    Rectal cancer Neg Hx    Stomach cancer Neg Hx     Social History   Socioeconomic History   Marital status: Married    Spouse name: Not on file   Number of children: 2   Years of education: Not on file   Highest education level: Not on file  Occupational History   Not on file  Tobacco Use   Smoking status: Former    Current packs/day: 0.00    Average packs/day: 1 pack/day for 23.0 years (23.0 ttl pk-yrs)    Types: Cigarettes    Start date: 05/03/1999    Quit date: 05/02/2022    Years since quitting: 1.5   Smokeless tobacco: Never  Vaping Use   Vaping status: Some Days  Substance and Sexual Activity   Alcohol use: Not Currently    Comment: quit drinking   Drug use: Yes    Types: Marijuana    Comment: 08-05-2022  per pt smokes 2-3 times a week/   hx remote ilicit drug use    Sexual activity: Not Currently  Other Topics Concern   Not on file  Social History Narrative   Not on file   Social Drivers of Health   Financial Resource Strain: Not on file  Food Insecurity: Not on file  Transportation Needs: Not on file  Physical Activity: Not on file  Stress: Not on file  Social Connections: Unknown (08/11/2021)   Received from [facility]   Social Network    Social Network: Not on file  Intimate Partner Violence: Unknown (07/03/2021)   Received from [facility]   HITS    Physically Hurt: Not on file    Insult or Talk Down To: Not on file    Threaten Physical Harm: Not on file    Scream or Curse: Not on file    Review of Systems: All other review of systems negative except as mentioned in the HPI.  Physical Exam: Vital signs BP (!) 146/76   Pulse 70   Temp 98.3 F (36.8 C) (Temporal)   Ht 5' 6 (1.676 m)   Wt 170 lb (77.1 kg)   SpO2 95%   BMI 27.44 kg/m   General:   Alert,  Well-developed, pleasant and cooperative in NAD Lungs:  Clear throughout to auscultation.   Heart:  Regular rate and rhythm Abdomen:  Soft, nontender and nondistended.   Neuro/Psych:  Alert and cooperative. Normal mood and affect. A and O x 3  Marcey Naval, MD [facility]

## 2023-12-06 NOTE — Progress Notes (Signed)
 Pt's states no medical or surgical changes since previsit or office visit.

## 2023-12-06 NOTE — Patient Instructions (Signed)
 Please read handouts provided. Continue present medications. Resume Eliquis  tomorrow. Resume previous diet.   YOU HAD AN ENDOSCOPIC PROCEDURE TODAY AT THE [facility]:   Refer to the procedure report that was given to you for any specific questions about what was found during the examination.  If the procedure report does not answer your questions, please call your gastroenterologist to clarify.  If you requested that your care partner not be given the details of your procedure findings, then the procedure report has been included in a sealed envelope for you to review at your convenience later.  YOU SHOULD EXPECT: Some feelings of bloating in the abdomen. Passage of more gas than usual.  Walking can help get rid of the air that was put into your GI tract during the procedure and reduce the bloating. If you had a lower endoscopy (such as a colonoscopy or flexible sigmoidoscopy) you may notice spotting of blood in your stool or on the toilet paper. If you underwent a bowel prep for your procedure, you may not have a normal bowel movement for a few days.  Please Note:  You might notice some irritation and congestion in your nose or some drainage.  This is from the oxygen used during your procedure.  There is no need for concern and it should clear up in a day or so.  SYMPTOMS TO REPORT IMMEDIATELY:  Following lower endoscopy (colonoscopy or flexible sigmoidoscopy):  Excessive amounts of blood in the stool  Significant tenderness or worsening of abdominal pains  Swelling of the abdomen that is new, acute  Fever of 100F or higher.  For urgent or emergent issues, a gastroenterologist can be reached at any hour by calling [phone]. Do not use MyChart messaging for urgent concerns.    DIET:  We do recommend a small meal at first, but then you may proceed to your regular diet.  Drink plenty of fluids but you should avoid alcoholic beverages for 24 hours.  ACTIVITY:  You should  plan to take it easy for the rest of today and you should NOT DRIVE or use heavy machinery until tomorrow (because of the sedation medicines used during the test).    FOLLOW UP: Our staff will call the number listed on your records the next business day following your procedure.  We will call around 7:15- 8:00 am to check on you and address any questions or concerns that you may have regarding the information given to you following your procedure. If we do not reach you, we will leave a message.     If any biopsies were taken you will be contacted by phone or by letter within the next 1-3 weeks.  Please call us  at [phone] if you have not heard about the biopsies in 3 weeks.    SIGNATURES/CONFIDENTIALITY: You and/or your care partner have signed paperwork which will be entered into your electronic medical record.  These signatures attest to the fact that that the information above on your After Visit Summary has been reviewed and is understood.  Full responsibility of the confidentiality of this discharge information lies with you and/or your care-partner.

## 2023-12-06 NOTE — Progress Notes (Signed)
 To PACU via stretcher, sedated, good respiratory effort, VSS.

## 2023-12-07 ENCOUNTER — Telehealth: Payer: Self-pay

## 2023-12-07 NOTE — Telephone Encounter (Signed)
 Opened in error

## 2023-12-07 NOTE — Telephone Encounter (Signed)
  Follow up Call-     12/06/2023    2:26 PM  Call back number  Post procedure Call Back phone  # [phone]  Permission to leave phone message Yes     Patient questions:  Do you have a fever, pain , or abdominal swelling? No. Pain Score  0 *  Have you tolerated food without any problems? Yes.    Have you been able to return to your normal activities? Yes.    Do you have any questions about your discharge instructions: Diet   No. Medications  No. Follow up visit  No.  Do you have questions or concerns about your Care? No.  Actions: * If pain score is 4 or above: No action needed, pain <4.

## 2023-12-09 ENCOUNTER — Ambulatory Visit: Payer: Self-pay | Admitting: Gastroenterology

## 2023-12-09 LAB — SURGICAL PATHOLOGY

## 2023-12-22 ENCOUNTER — Ambulatory Visit (INDEPENDENT_AMBULATORY_CARE_PROVIDER_SITE_OTHER): Admitting: Licensed Clinical Social Worker

## 2023-12-22 DIAGNOSIS — F411 Generalized anxiety disorder: Secondary | ICD-10-CM | POA: Diagnosis not present

## 2023-12-22 DIAGNOSIS — F331 Major depressive disorder, recurrent, moderate: Secondary | ICD-10-CM

## 2023-12-22 DIAGNOSIS — F4310 Post-traumatic stress disorder, unspecified: Secondary | ICD-10-CM
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
## 2024-01-05 ENCOUNTER — Ambulatory Visit (INDEPENDENT_AMBULATORY_CARE_PROVIDER_SITE_OTHER): Admitting: Licensed Clinical Social Worker

## 2024-01-05 DIAGNOSIS — F331 Major depressive disorder, recurrent, moderate: Secondary | ICD-10-CM | POA: Diagnosis not present

## 2024-01-05 DIAGNOSIS — F1220 Cannabis dependence, uncomplicated: Secondary | ICD-10-CM | POA: Diagnosis not present

## 2024-01-05 DIAGNOSIS — F4310 Post-traumatic stress disorder, unspecified: Secondary | ICD-10-CM

## 2024-01-05 DIAGNOSIS — F411 Generalized anxiety disorder: Secondary | ICD-10-CM

## 2024-01-05 NOTE — Progress Notes (Signed)
 THERAPIST PROGRESS NOTE   Session Time:  2:00pm  - 3:00pm    Location:  Patient: [facility] Clinician: [facility]   Participation Level: Active   Behavioral Response: Alert, casually dressed, anxious mood/affect      Type of Therapy:  Individual Therapy   Treatment Goals addressed: Mood regulation; Medication compliance; Monitoring substance use   Progress Towards Goals: Progressing   Interventions: CBT, harm reduction, motivational interviewing, CUDIT-R screening    Summary: Andranik L. Kinsella is a 68 year old married Caucasian male that presented for therapy session today with diagnosis of Major Depressive Disorder, recurrent episode, moderate; Generalized Anxiety Disorder; PTSD; and Cannabis Use Disorder, moderate.      Suicidal/Homicidal: None; without plan or intent    Therapist Response: Clinician met with Christopher for in-person therapy session and assessed for safety, and sobriety. Garrell presented on time for today's appointment and was alert, oriented x5, with no evidence or self-report of active SI/HI or A/V H.  Adi denied use of alcohol. Kaydon reported compliance with medication.  Clinician inquired about Antoneo's current emotional ratings, as well as any significant changes in thoughts, feelings, or behavior since previous check-in.  Cleofas reported scores of 5/10 for depression, 5/10 for anxiety, and 2/10 for anger/irritability.  He denied any recent panic attacks or outbursts.  Wade reported that a recent struggle has been using marijuana daily again and feeling frustrated about not being able to quit.  Clinician proposed completing a cannabis use screening today to gain further insight into the scope and severity of the issue, which Keagan was agreeable to.  Clinician completed a CUDIT-R screening tool with Kendall which covered several common symptoms of this disorder, such as frequency of use, neglecting daily responsibilities, use in hazardous situations, and more.  Clinician  provided psychoeducation on variety of physical, mental, and social consequences that could result from ongoing use of this substance.  Armani actively participated in questioning, and met 17 criteria at present, including taking cannabis in larger amounts than previously intended, unsuccessful efforts to cut down on use, craving to use marijuana, failure to fulfill role obligations due to use, continued use despite negative impact on psychological wellbeing, and tolerance.  Clinician discussed results with Kemontae, and offered harm reduction approach that could be implemented in order to cut back overall use and eventually abstain completely from marijuana.  Intervention was effective, as evidenced by Christopher expressing receptiveness to suggestion and reporting that he will plan to reduce overall marijuana use from '1 blunt' daily down to '1 blunt' every other day to get started this week, with eventual goal of quitting completely as he learns new coping skills through therapy to manage mood swings.  He stated "I'm really blessed to still be here, and I think I can learn to deal with it".   Clinician modified Ibraheem's current diagnoses to include Cannabis Use Disorder for targeting during treatment, and will continue to monitor.         Plan: Follow up again in 1 week.    Diagnosis: Major Depressive Disorder, recurrent episode, moderate; Generalized Anxiety Disorder; PTSD and Cannabis Use Disorder, moderate.  Collaboration of Care:   No collaboration of care required for this visit.  Patient/Guardian was advised Release of Information must be obtained prior to any record release in order to collaborate their care with an outside provider. Patient/Guardian was advised if they have not already done so to contact the registration department to sign all necessary forms in order for us  to release information regarding their care.    Consent: Patient/Guardian gives  verbal consent for treatment and assignment of benefits for services provided during this visit. Patient/Guardian expressed understanding and agreed to proceed.   Darleene Ricker, LCSW, LCAS  01/05/24

## 2024-02-03 ENCOUNTER — Telehealth: Payer: Self-pay | Admitting: Cardiology

## 2024-02-03 DIAGNOSIS — I10 Essential (primary) hypertension: Secondary | ICD-10-CM

## 2024-02-03 MED ORDER — HYDRALAZINE HCL 50 MG PO TABS
50.0000 mg | ORAL_TABLET | Freq: Three times a day (TID) | ORAL | 0 refills | Status: AC
Start: 2024-02-03 — End: 2024-05-03

## 2024-02-03 NOTE — Telephone Encounter (Signed)
*  STAT* If patient is at the pharmacy, call can be transferred to refill team.   1. Which medications need to be refilled? (please list name of each medication and dose if known)   hydrALAZINE  (APRESOLINE ) 50 MG tablet     2. Would you like to learn more about the convenience, safety, & potential cost savings by using the [facility]? No   3. Are you open to using the [facility] (Type [facility].) No   4. Which pharmacy/location (including street and city if local pharmacy) is medication to be sent to?  [id number] - [place], [place] - [address]   5. Do they need a 30 day or 90 day supply? 90 day

## 2024-02-03 NOTE — Telephone Encounter (Signed)
 Refill sent.

## 2024-02-16 ENCOUNTER — Ambulatory Visit (HOSPITAL_COMMUNITY): Admitting: Psychiatry

## 2024-02-20 ENCOUNTER — Telehealth (HOSPITAL_COMMUNITY): Payer: Self-pay | Admitting: Licensed Clinical Social Worker

## 2024-02-20 ENCOUNTER — Ambulatory Visit (HOSPITAL_COMMUNITY): Admitting: Licensed Clinical Social Worker

## 2024-02-20 NOTE — Telephone Encounter (Signed)
 [name] had an in-person appointment scheduled today for 1pm.  Clinician joan Christopher by phone at 1:08pm when he did not present on time.  [name] answered this phone call and reported that he thought he had rescheduled with front desk staff another day.  He reported that he could not recall when or whom he spoke with.  Clinician inquired about whether [name] could do a virtual session, or arrive in time for in person session.  [name] reported that he could not do virtual, and lived too far away to make it in time. Clinician informed [name] that there could be a charge if this encounter is listed as a no-show.  [name] expressed understanding and reported that he would be following up next week.  Clinician encouraged him to set a reminder on his phone just in case. Clinician consulted with management staff about this event and agreed not to charge Christopher.     Darleene Ricker, LCSW, LCAS 02/20/24

## 2024-02-27 ENCOUNTER — Other Ambulatory Visit (HOSPITAL_COMMUNITY): Payer: Self-pay | Admitting: Psychiatry

## 2024-02-27 DIAGNOSIS — F1210 Cannabis abuse, uncomplicated: Secondary | ICD-10-CM

## 2024-02-27 DIAGNOSIS — F411 Generalized anxiety disorder: Secondary | ICD-10-CM

## 2024-03-01 ENCOUNTER — Ambulatory Visit (HOSPITAL_COMMUNITY): Admitting: Psychiatry

## 2024-03-01 ENCOUNTER — Other Ambulatory Visit (HOSPITAL_COMMUNITY): Payer: Self-pay | Admitting: *Deleted

## 2024-03-01 DIAGNOSIS — F411 Generalized anxiety disorder: Secondary | ICD-10-CM

## 2024-03-01 DIAGNOSIS — F1210 Cannabis abuse, uncomplicated: Secondary | ICD-10-CM

## 2024-03-01 MED ORDER — BUSPIRONE HCL 5 MG PO TABS: 5.0000 mg | ORAL_TABLET | Freq: Two times a day (BID) | ORAL | 0 refills | Status: AC

## 2024-03-06 ENCOUNTER — Telehealth (INDEPENDENT_AMBULATORY_CARE_PROVIDER_SITE_OTHER): Payer: Self-pay | Admitting: Licensed Clinical Social Worker

## 2024-03-06 ENCOUNTER — Ambulatory Visit (HOSPITAL_COMMUNITY): Payer: Self-pay | Admitting: Licensed Clinical Social Worker

## 2024-03-06 NOTE — Telephone Encounter (Signed)
 [name] had an appointment schedule today for 3pm.  Clinician joan Christopher by phone at 3:10pm when he had not presented on time.  Clinician was unable to reach [name] by phone, nor leave a voicemail.  Clinician informed front desk staff of no-show event when [name] did not present by 3:15pm.  Evan Ricker, LCSW, LCAS 03/06/24

## 2024-03-20 ENCOUNTER — Encounter (HOSPITAL_COMMUNITY): Payer: Self-pay

## 2024-03-20 ENCOUNTER — Telehealth (HOSPITAL_COMMUNITY): Payer: Self-pay | Admitting: Licensed Clinical Social Worker

## 2024-03-20 ENCOUNTER — Ambulatory Visit (HOSPITAL_COMMUNITY): Payer: Self-pay | Admitting: Licensed Clinical Social Worker

## 2024-03-20 NOTE — Telephone Encounter (Signed)
 [name] had an in-person therapy appointment scheduled today for 3pm.  Clinician outreached [name] this morning at 10:42am by phone to remind him of this appointment due to last no-show event.  [name] answered this phone call and reported that he would be unable to attend today due to another medical appointment set around the same time window.  Clinician reminded [name] of no-show policy and potential for discharge if there are 3 consecutive no show events.  [name] expressed understanding.  Clinician informed front desk staff of no-show event.    [name], [place], LCAS 03/20/24

## 2024-04-05 ENCOUNTER — Ambulatory Visit (HOSPITAL_COMMUNITY): Admitting: Psychiatry

## 2024-04-26 ENCOUNTER — Ambulatory Visit: Admitting: Cardiology

## 2024-04-26 ENCOUNTER — Telehealth (INDEPENDENT_AMBULATORY_CARE_PROVIDER_SITE_OTHER): Payer: Self-pay

## 2024-04-26 NOTE — Telephone Encounter (Signed)
 Spoke to patient regarding getting an appointment with us . I let him know that he would need to get a referral sent to us  first. Patient understood.

## 2024-04-26 NOTE — Progress Notes (Unsigned)
 " Cardiology Office Note:  .   Date:  04/26/2024  ID:  [name], DOB 1955/05/20, MRN [id number] PCP: Maree Leni Edyth DELENA, MD  [facility] Providers Cardiologist:  Gordy Bergamo, MD { Click to update primary MD,subspecialty MD or APP then REFRESH:1}  History of Present Illness: .   [name] is a 69 y.o. 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 LAD in 2017, peripheral artery disease with interventions to his lower extremity in the remote past in [place] , primary hypertension, mixed hyperlipidemia, history of substance abuse remotely, tobacco use disorder quit 05/02/2022, hypertension, history of TIA and excessive alcohol use quit in July 2021, bilateral carotid endarterectomy left in December 21 and right in January 2022, family history of premature coronary artery disease in which his father died at the age of 88 and his mother at age 84 from MI with CABG in her 86s, paroxysmal atrial fibrillation and underwent cardioversion on 09/23/2023 and is maintained sinus rhythm.     Discussed the use of AI scribe software for clinical note transcription with the patient, who gave verbal consent to proceed.  History of Present Illness    Cardiac Studies relevent.    Left Heart Catheterization 12/18/2015:    Proximal and mid LAD with 2 overlapping 4.0 x 22 and 3.5 x 12 mm resolute integrity DES     Echocardiogram 04/29/2021: Normal LV systolic function, EF 2060% with grade 1 diastolic dysfunction.  Carotid artery duplex 11/19/2021: Bilateral ICA widely patent with minimal homogenous plaque.  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  Bilateral antegrade vertebral artery flow. EKG:      Labs   Lab Results  Component Value Date   CHOL 91 (L) 08/26/2023   HDL 42 08/26/2023   LDLCALC 35 08/26/2023   LDLDIRECT 27 07/11/2019   TRIG 62 08/26/2023   CHOLHDL 2.2 08/26/2023   Lipoprotein (a)  Date/Time Value Ref Range Status  07/11/2019 08:11 AM 15.1 <75.0  nmol/L Final    Comment:    Note:  Values greater than or equal to 75.0 nmol/L may        indicate an independent risk factor for CHD,        but must be evaluated with caution when applied        to non-Caucasian populations due to the        influence of genetic factors on Lp(a) across        ethnicities.     Recent Labs    08/26/23 0837  NA 137  K 3.9  CL 98  CO2 21  GLUCOSE 79  BUN 14  CREATININE 1.18  CALCIUM  9.1    Lab Results  Component Value Date   ALT 15 08/26/2023   AST 17 08/26/2023   ALKPHOS 96 08/26/2023   BILITOT 0.3 08/26/2023      Latest Ref Rng & Units 08/26/2023    8:37 AM 11/01/2022    2:21 PM 09/16/2021    2:46 PM  CBC  WBC 3.4 - 10.8 x10E3/uL 7.7  7.3  8.6   Hemoglobin 13.0 - 17.7 g/dL 85.1  89.7  87.1   Hematocrit 37.5 - 51.0 % 46.7  31.6  38.6   Platelets 150 - 450 x10E3/uL 217  216  197    Lab Results  Component Value Date   HGBA1C 6.2 (H) 11/01/2022    Lab Results  Component Value Date   TSH 1.72 09/16/2021  Care everywhere/Faxed External Labs:  ***  ROS  ***ROS Physical Exam:   VS:  There were no vitals taken for this visit.   Wt Readings from Last 3 Encounters:  12/06/23 170 lb (77.1 kg)  11/22/23 170 lb (77.1 kg)  10/27/23 176 lb (79.8 kg)    BP Readings from Last 3 Encounters:  12/06/23 (!) 156/77  10/27/23 (!) 147/69  09/29/23 (!) 90/50   ***Physical Exam  ASSESSMENT AND PLAN: .      ICD-10-CM   1. Atherosclerosis of native coronary artery of native heart without angina pectoris  I25.10     2. Paroxysmal atrial fibrillation (HCC)  I48.0     3. Primary hypertension  I10     4. Mixed hyperlipidemia  E78.2      Assessment and Plan Assessment & Plan    Follow up: ***  Signed,  Gordy Bergamo, MD, [facility] 04/26/2024, 2:40 PM [facility] [address] [place], [place] [address] Phone: [phone]. Fax:  [phone]  "

## 2024-05-08 ENCOUNTER — Ambulatory Visit: Admitting: Family
